# Patient Record
Sex: Male | Born: 1945 | Race: Black or African American | Hispanic: No | Marital: Single | Smoking: Former smoker
Health system: Southern US, Community
[De-identification: ages and names within clinical notes are randomized; demographics above are authoritative.]

## PROBLEM LIST (undated history)

## (undated) DIAGNOSIS — I739 Peripheral vascular disease, unspecified: Secondary | ICD-10-CM

## (undated) DIAGNOSIS — K219 Gastro-esophageal reflux disease without esophagitis: Secondary | ICD-10-CM

## (undated) DIAGNOSIS — F1010 Alcohol abuse, uncomplicated: Secondary | ICD-10-CM

## (undated) DIAGNOSIS — E7800 Pure hypercholesterolemia, unspecified: Secondary | ICD-10-CM

## (undated) DIAGNOSIS — G20A1 Parkinson's disease without dyskinesia, without mention of fluctuations: Secondary | ICD-10-CM

## (undated) DIAGNOSIS — N400 Enlarged prostate without lower urinary tract symptoms: Secondary | ICD-10-CM

## (undated) DIAGNOSIS — R251 Tremor, unspecified: Secondary | ICD-10-CM

## (undated) DIAGNOSIS — I10 Essential (primary) hypertension: Secondary | ICD-10-CM

## (undated) DIAGNOSIS — M47812 Spondylosis without myelopathy or radiculopathy, cervical region: Secondary | ICD-10-CM

## (undated) DIAGNOSIS — I2510 Atherosclerotic heart disease of native coronary artery without angina pectoris: Secondary | ICD-10-CM

## (undated) DIAGNOSIS — J449 Chronic obstructive pulmonary disease, unspecified: Secondary | ICD-10-CM

## (undated) DIAGNOSIS — G20 Parkinson's disease: Secondary | ICD-10-CM

## (undated) DIAGNOSIS — J439 Emphysema, unspecified: Secondary | ICD-10-CM

## (undated) DIAGNOSIS — M1990 Unspecified osteoarthritis, unspecified site: Secondary | ICD-10-CM

## (undated) HISTORY — DX: Alcohol abuse, uncomplicated: F10.10

## (undated) HISTORY — DX: Spondylosis without myelopathy or radiculopathy, cervical region: M47.812

## (undated) HISTORY — PX: VASECTOMY: SHX75

## (undated) HISTORY — DX: Parkinson's disease without dyskinesia, without mention of fluctuations: G20.A1

## (undated) HISTORY — DX: Atherosclerotic heart disease of native coronary artery without angina pectoris: I25.10

## (undated) HISTORY — DX: Tremor, unspecified: R25.1

## (undated) HISTORY — PX: TONSILLECTOMY: SUR1361

## (undated) HISTORY — DX: Parkinson's disease: G20

## (undated) HISTORY — DX: Gastro-esophageal reflux disease without esophagitis: K21.9

## (undated) HISTORY — DX: Chronic obstructive pulmonary disease, unspecified: J44.9

## (undated) HISTORY — DX: Peripheral vascular disease, unspecified: I73.9

## (undated) HISTORY — DX: Benign prostatic hyperplasia without lower urinary tract symptoms: N40.0

---

## 1998-07-26 ENCOUNTER — Emergency Department (HOSPITAL_COMMUNITY): Admission: EM | Admit: 1998-07-26 | Discharge: 1998-07-26 | Payer: Self-pay | Admitting: Emergency Medicine

## 1998-07-26 ENCOUNTER — Encounter: Payer: Self-pay | Admitting: Emergency Medicine

## 1998-08-08 ENCOUNTER — Ambulatory Visit (HOSPITAL_COMMUNITY): Admission: RE | Admit: 1998-08-08 | Discharge: 1998-08-08 | Payer: Self-pay | Admitting: Cardiology

## 1998-08-08 ENCOUNTER — Encounter: Payer: Self-pay | Admitting: Cardiology

## 1998-08-19 ENCOUNTER — Emergency Department (HOSPITAL_COMMUNITY): Admission: EM | Admit: 1998-08-19 | Discharge: 1998-08-19 | Payer: Self-pay | Admitting: Emergency Medicine

## 2000-11-23 ENCOUNTER — Emergency Department (HOSPITAL_COMMUNITY): Admission: EM | Admit: 2000-11-23 | Discharge: 2000-11-23 | Payer: Self-pay | Admitting: Emergency Medicine

## 2000-12-08 ENCOUNTER — Emergency Department (HOSPITAL_COMMUNITY): Admission: EM | Admit: 2000-12-08 | Discharge: 2000-12-08 | Payer: Self-pay | Admitting: Emergency Medicine

## 2001-04-16 ENCOUNTER — Encounter: Payer: Self-pay | Admitting: Dermatology

## 2001-04-16 ENCOUNTER — Encounter: Admission: RE | Admit: 2001-04-16 | Discharge: 2001-04-16 | Payer: Self-pay | Admitting: Gynecology

## 2003-10-26 ENCOUNTER — Ambulatory Visit (HOSPITAL_COMMUNITY): Admission: RE | Admit: 2003-10-26 | Discharge: 2003-10-26 | Payer: Self-pay | Admitting: Internal Medicine

## 2004-06-06 ENCOUNTER — Ambulatory Visit: Payer: Self-pay | Admitting: Internal Medicine

## 2004-06-06 ENCOUNTER — Ambulatory Visit (HOSPITAL_COMMUNITY): Admission: RE | Admit: 2004-06-06 | Discharge: 2004-06-06 | Payer: Self-pay | Admitting: Internal Medicine

## 2004-07-27 ENCOUNTER — Ambulatory Visit: Payer: Self-pay | Admitting: Internal Medicine

## 2004-08-07 ENCOUNTER — Ambulatory Visit: Payer: Self-pay | Admitting: Internal Medicine

## 2004-11-04 ENCOUNTER — Emergency Department (HOSPITAL_COMMUNITY): Admission: EM | Admit: 2004-11-04 | Discharge: 2004-11-04 | Payer: Self-pay | Admitting: Emergency Medicine

## 2005-05-08 ENCOUNTER — Ambulatory Visit: Payer: Self-pay | Admitting: Internal Medicine

## 2005-07-26 ENCOUNTER — Ambulatory Visit: Payer: Self-pay | Admitting: Internal Medicine

## 2005-10-06 ENCOUNTER — Observation Stay (HOSPITAL_COMMUNITY): Admission: EM | Admit: 2005-10-06 | Discharge: 2005-10-07 | Payer: Self-pay | Admitting: Emergency Medicine

## 2005-10-06 ENCOUNTER — Ambulatory Visit: Payer: Self-pay | Admitting: Cardiology

## 2005-10-07 ENCOUNTER — Ambulatory Visit: Payer: Self-pay | Admitting: Internal Medicine

## 2005-10-15 ENCOUNTER — Ambulatory Visit: Payer: Self-pay

## 2005-10-22 ENCOUNTER — Ambulatory Visit: Payer: Self-pay | Admitting: Internal Medicine

## 2005-10-26 ENCOUNTER — Ambulatory Visit: Payer: Self-pay | Admitting: Internal Medicine

## 2005-11-05 ENCOUNTER — Ambulatory Visit: Payer: Self-pay | Admitting: Internal Medicine

## 2005-12-07 ENCOUNTER — Ambulatory Visit: Payer: Self-pay | Admitting: Internal Medicine

## 2006-02-01 ENCOUNTER — Ambulatory Visit: Payer: Self-pay | Admitting: Internal Medicine

## 2006-05-01 ENCOUNTER — Ambulatory Visit: Payer: Self-pay | Admitting: Internal Medicine

## 2006-05-06 ENCOUNTER — Emergency Department (HOSPITAL_COMMUNITY): Admission: EM | Admit: 2006-05-06 | Discharge: 2006-05-06 | Payer: Self-pay | Admitting: Emergency Medicine

## 2006-08-08 ENCOUNTER — Ambulatory Visit: Payer: Self-pay | Admitting: Internal Medicine

## 2006-10-08 ENCOUNTER — Ambulatory Visit: Payer: Self-pay | Admitting: Internal Medicine

## 2007-02-03 ENCOUNTER — Ambulatory Visit: Payer: Self-pay | Admitting: Internal Medicine

## 2007-02-07 ENCOUNTER — Ambulatory Visit: Payer: Self-pay | Admitting: Internal Medicine

## 2007-02-14 ENCOUNTER — Encounter: Admission: RE | Admit: 2007-02-14 | Discharge: 2007-05-15 | Payer: Self-pay | Admitting: Occupational Medicine

## 2007-07-15 ENCOUNTER — Emergency Department (HOSPITAL_COMMUNITY): Admission: EM | Admit: 2007-07-15 | Discharge: 2007-07-15 | Payer: Self-pay | Admitting: Emergency Medicine

## 2007-07-17 ENCOUNTER — Ambulatory Visit (HOSPITAL_COMMUNITY): Admission: RE | Admit: 2007-07-17 | Discharge: 2007-07-17 | Payer: Self-pay | Admitting: Sports Medicine

## 2007-07-25 ENCOUNTER — Ambulatory Visit: Payer: Self-pay | Admitting: Internal Medicine

## 2007-07-25 DIAGNOSIS — I4891 Unspecified atrial fibrillation: Secondary | ICD-10-CM | POA: Insufficient documentation

## 2007-07-25 DIAGNOSIS — K219 Gastro-esophageal reflux disease without esophagitis: Secondary | ICD-10-CM | POA: Insufficient documentation

## 2007-07-25 DIAGNOSIS — I10 Essential (primary) hypertension: Secondary | ICD-10-CM | POA: Insufficient documentation

## 2007-07-25 DIAGNOSIS — N529 Male erectile dysfunction, unspecified: Secondary | ICD-10-CM | POA: Insufficient documentation

## 2007-07-25 DIAGNOSIS — M545 Low back pain, unspecified: Secondary | ICD-10-CM | POA: Insufficient documentation

## 2007-09-05 ENCOUNTER — Encounter: Payer: Self-pay | Admitting: Internal Medicine

## 2007-09-19 ENCOUNTER — Encounter
Admission: RE | Admit: 2007-09-19 | Discharge: 2007-09-19 | Payer: Self-pay | Admitting: Physical Medicine and Rehabilitation

## 2007-10-13 ENCOUNTER — Encounter: Payer: Self-pay | Admitting: Internal Medicine

## 2007-11-06 ENCOUNTER — Ambulatory Visit: Payer: Self-pay | Admitting: Internal Medicine

## 2007-11-07 ENCOUNTER — Emergency Department (HOSPITAL_COMMUNITY): Admission: EM | Admit: 2007-11-07 | Discharge: 2007-11-07 | Payer: Self-pay | Admitting: Emergency Medicine

## 2007-11-26 ENCOUNTER — Telehealth: Payer: Self-pay | Admitting: Internal Medicine

## 2007-11-27 ENCOUNTER — Ambulatory Visit: Payer: Self-pay | Admitting: Internal Medicine

## 2007-11-27 DIAGNOSIS — M25559 Pain in unspecified hip: Secondary | ICD-10-CM | POA: Insufficient documentation

## 2008-01-20 ENCOUNTER — Emergency Department (HOSPITAL_COMMUNITY): Admission: EM | Admit: 2008-01-20 | Discharge: 2008-01-20 | Payer: Self-pay | Admitting: Emergency Medicine

## 2008-07-30 DIAGNOSIS — D709 Neutropenia, unspecified: Secondary | ICD-10-CM | POA: Insufficient documentation

## 2008-12-16 ENCOUNTER — Emergency Department (HOSPITAL_COMMUNITY): Admission: EM | Admit: 2008-12-16 | Discharge: 2008-12-16 | Payer: Self-pay | Admitting: Emergency Medicine

## 2009-07-30 DIAGNOSIS — I2510 Atherosclerotic heart disease of native coronary artery without angina pectoris: Secondary | ICD-10-CM | POA: Insufficient documentation

## 2009-07-30 DIAGNOSIS — Z9889 Other specified postprocedural states: Secondary | ICD-10-CM | POA: Insufficient documentation

## 2010-12-15 NOTE — H&P (Signed)
NAME:  Lee, [name] NO.:  [id number]   MEDICAL RECORD NO.:  [id number]          PATIENT TYPE:  EMS   LOCATION:  MAJO                         FACILITY:  [facility]   PHYSICIAN:  Wanda Plump, MD [facility]    DATE OF BIRTH:  August 20, 1945   DATE OF ADMISSION:  10/06/2005  DATE OF DISCHARGE:                                HISTORY & PHYSICAL   PRIMARY CARE PHYSICIAN:  Dr. Posey Rea   CHIEF COMPLAINT:  Chest pain.   HISTORY OF PRESENT ILLNESS:  Lee [name] is a 65 year old African-American  male who presented to the emergency room with a 3-day history of mild chest  discomfort. The discomfort is located at the left anterior chest. The  patient denies any radiation of the pain to me. It lasts 1 or 2 seconds, it  may be with or without exertion. At least a couple times it has been  postprandial. The patient got concerned because also a couple of times this  discomfort has been associated with weakness, headache and dizziness. No  nausea or diaphoresis with it, however he did admit to having some shortness  of breath. Presently he is chest pain free. In the last 3 days he has had  around 5 or 6 episodes of these symptoms.   PAST MEDICAL HISTORY:  1.  Denies any surgeries.  2.  Denies diabetes.  3.  During one of his complete physicals in the past he was told he had a      slightly elevated blood pressure and slightly elevated cholesterol.   FAMILY HISTORY:  1.  No coronary artery disease or stroke.  2.  Father had a pacemaker.  3.  Mother had diabetes.   SOCIAL HISTORY:  No smoking. Drinks socially.   REVIEW OF SYSTEMS:  He denies any fever. He denies any heart burn but points  to the left upper quadrant area of the abdomen when he has burning from time  to time. No lower extremity edema. No rash in the chest.   MEDICATIONS:  Aspirin 325 one daily.   ALLERGIES:  No known drug allergies.   PHYSICAL EXAMINATION:  GENERAL:  The patient is alert and oriented and in no  distress.  VITAL SIGNS:  He is afebrile, pulse 74, respiration 20, blood pressure  146/90, O2 saturation 98% on room air.  LUNGS:  Clear.  CARDIOVASCULAR:  Regular rate and rhythm without a murmur.  CHEST WALL:  Not tender to palpation.  ABDOMEN:  Not distended, soft, good bowel sounds, nontender.  EXTREMITIES:  No edema.  NEUROLOGICAL EXAM:  Speech and motor are intact.   LABORATORY AND X-RAY:  Chest x-ray is negative. EKG shows no acute changes.  D-dimer is negative. Cardiac enzymes x2 in the emergency room were negative.  Potassium 3.7, creatinine 1.1, blood sugar 92.   ASSESSMENT AND PLAN:  The patient is admitted with new onset of atypical  chest pain. He is cardiovascular risk factor profile is low because he is  not a smoker with no known diabetes, his family history is negative. His  cholesterol status is unknown. He however does have  a slightly elevated  blood pressure. At this point he will be admitted to telemetry, will rule  him out for an acute myocardial infarction with serial enzymes and an EKG in  the morning. If the work up is negative he will be able to go home tomorrow  and have a stress-test done as an outpatient on Monday, October 08, 2005. This  was discussed by cardiology. Will consider also a low dose of beta blocker  depending on how his blood pressure does during this admission.      Wanda Plump, MD [facility]  Electronically Signed     JEP/MEDQ  D:  10/06/2005  T:  10/07/2005  Job:  [id number]   cc:   Georgina Quint. [name], M.D. [facility]  [address]  [place]  [place] [address]

## 2010-12-15 NOTE — Op Note (Signed)
NAME:  Lee [name], Lee [name] NO.:  [id number]   MEDICAL RECORD NO.:  [id number]          PATIENT TYPE:  OBV   LOCATION:  3729                         FACILITY:  [facility]   PHYSICIAN:  Georgina Quint. [name], M.D. LHCDATE OF BIRTH:  [birth year]   DATE OF PROCEDURE:  DATE OF DISCHARGE:                                 OPERATIVE REPORT   FINAL DIAGNOSES:  1.  Atypical chest pain most likely due to costochondritis.  2.  Hypertension.   DISCHARGE DISPOSITION:  He will stay out of work until stress test.  That  will be scheduled some time early next week.  He will call if there are any  problems.   DISCHARGE MEDICATIONS:  Resume previous meds, Pepcid AC over-the-counter 1  daily.   SPECIAL INSTRUCTIONS:  Call if problems.   HISTORY:  The patient is a 65 year old male who presented with atypical  chest symptoms yesterday and was admitted by Dr. Drue Novel.  For the details,  please refer to his history and physical.   HOSPITAL COURSE:  During the hospital course, the patient had no chest pain.  On the day of discharge he is feeling well.  Temperature 98.1, heart rate  75, respiratory rate 20, blood pressure 131/92.  Oxygen saturations 92% room  air.  He is in no acute distress.  Lungs clear. Heart regular.  S1, S2,  abdomen soft and nontender.  Chest wall slight tenderness over costochondral  junction midsternum on the left.  Alert and oriented, cooperative.   LABS:  CK x3 negative. MB x3 negative.  Troponins x3 negative.  EKG today  was no acute changes, ectopic atrial rhythm.  Chest x-ray normal.           ______________________________  Georgina Quint. [name], M.D. [facility]     AVP/MEDQ  D:  10/07/2005  T:  10/08/2005  Job:  [id number]

## 2011-05-22 ENCOUNTER — Emergency Department (HOSPITAL_COMMUNITY)
Admission: EM | Admit: 2011-05-22 | Discharge: 2011-05-23 | Disposition: A | Discharge status: Home / Self Care | Payer: Medicare Other | Attending: Emergency Medicine | Admitting: Emergency Medicine

## 2011-05-22 DIAGNOSIS — J449 Chronic obstructive pulmonary disease, unspecified: Secondary | ICD-10-CM | POA: Insufficient documentation

## 2011-05-22 DIAGNOSIS — R079 Chest pain, unspecified: Secondary | ICD-10-CM | POA: Insufficient documentation

## 2011-05-22 DIAGNOSIS — J4489 Other specified chronic obstructive pulmonary disease: Secondary | ICD-10-CM | POA: Insufficient documentation

## 2011-05-22 DIAGNOSIS — I10 Essential (primary) hypertension: Secondary | ICD-10-CM | POA: Insufficient documentation

## 2011-05-22 DIAGNOSIS — M069 Rheumatoid arthritis, unspecified: Secondary | ICD-10-CM | POA: Insufficient documentation

## 2011-05-22 DIAGNOSIS — K449 Diaphragmatic hernia without obstruction or gangrene: Secondary | ICD-10-CM | POA: Insufficient documentation

## 2011-05-23 ENCOUNTER — Emergency Department (HOSPITAL_COMMUNITY): Payer: Medicare Other

## 2011-05-23 LAB — CBC
Hemoglobin: 13.8 g/dL (ref 13.0–17.0)
MCH: 32.4 pg (ref 26.0–34.0)
MCHC: 34.2 g/dL (ref 30.0–36.0)
MCV: 94.6 fL (ref 78.0–100.0)
RBC: 4.26 MIL/uL (ref 4.22–5.81)
RDW: 12.3 % (ref 11.5–15.5)
WBC: 3.9 10*3/uL — ABNORMAL LOW (ref 4.0–10.5)

## 2011-05-23 LAB — BASIC METABOLIC PANEL
BUN: 19 mg/dL (ref 6–23)
CO2: 29 mEq/L (ref 19–32)
Calcium: 9.3 mg/dL (ref 8.4–10.5)
Glucose, Bld: 86 mg/dL (ref 70–99)

## 2011-05-23 LAB — DIFFERENTIAL
Basophils Absolute: 0 10*3/uL (ref 0.0–0.1)
Eosinophils Absolute: 0.5 10*3/uL (ref 0.0–0.7)
Eosinophils Relative: 13 % — ABNORMAL HIGH (ref 0–5)
Lymphocytes Relative: 41 % (ref 12–46)
Neutrophils Relative %: 35 % — ABNORMAL LOW (ref 43–77)

## 2011-05-23 LAB — POCT I-STAT TROPONIN I: Troponin i, poc: 0.01 ng/mL (ref 0.00–0.08)

## 2012-11-30 ENCOUNTER — Emergency Department (HOSPITAL_COMMUNITY): Payer: Medicare Other

## 2012-11-30 ENCOUNTER — Encounter (HOSPITAL_COMMUNITY): Payer: Self-pay | Admitting: Emergency Medicine

## 2012-11-30 ENCOUNTER — Emergency Department (HOSPITAL_COMMUNITY)
Admission: EM | Admit: 2012-11-30 | Discharge: 2012-11-30 | Disposition: A | Discharge status: Home / Self Care | Payer: Medicare Other | Attending: Emergency Medicine | Admitting: Emergency Medicine

## 2012-11-30 DIAGNOSIS — R059 Cough, unspecified: Secondary | ICD-10-CM | POA: Insufficient documentation

## 2012-11-30 DIAGNOSIS — R0609 Other forms of dyspnea: Secondary | ICD-10-CM | POA: Insufficient documentation

## 2012-11-30 DIAGNOSIS — R062 Wheezing: Secondary | ICD-10-CM | POA: Insufficient documentation

## 2012-11-30 DIAGNOSIS — Z8739 Personal history of other diseases of the musculoskeletal system and connective tissue: Secondary | ICD-10-CM | POA: Insufficient documentation

## 2012-11-30 DIAGNOSIS — J4489 Other specified chronic obstructive pulmonary disease: Secondary | ICD-10-CM | POA: Insufficient documentation

## 2012-11-30 DIAGNOSIS — J449 Chronic obstructive pulmonary disease, unspecified: Secondary | ICD-10-CM | POA: Insufficient documentation

## 2012-11-30 DIAGNOSIS — Z79899 Other long term (current) drug therapy: Secondary | ICD-10-CM | POA: Insufficient documentation

## 2012-11-30 DIAGNOSIS — R0989 Other specified symptoms and signs involving the circulatory and respiratory systems: Secondary | ICD-10-CM | POA: Insufficient documentation

## 2012-11-30 DIAGNOSIS — Z87891 Personal history of nicotine dependence: Secondary | ICD-10-CM | POA: Insufficient documentation

## 2012-11-30 DIAGNOSIS — R0600 Dyspnea, unspecified: Secondary | ICD-10-CM

## 2012-11-30 DIAGNOSIS — R05 Cough: Secondary | ICD-10-CM | POA: Insufficient documentation

## 2012-11-30 HISTORY — DX: Unspecified osteoarthritis, unspecified site: M19.90

## 2012-11-30 MED ORDER — ALBUTEROL SULFATE HFA 108 (90 BASE) MCG/ACT IN AERS: 2.0000 | INHALATION_SPRAY | RESPIRATORY_TRACT | Status: DC | PRN

## 2012-11-30 MED ORDER — HYDROCOD POLST-CHLORPHEN POLST 10-8 MG/5ML PO LQCR
5.0000 mL | Freq: Once | ORAL | Status: AC
  Administered 2012-11-30: 5 mL via ORAL
  Filled 2012-11-30: qty 5

## 2012-11-30 MED ORDER — LORATADINE 10 MG PO TABS: 10.0000 mg | ORAL_TABLET | Freq: Every day | ORAL | Status: DC

## 2012-11-30 MED ORDER — HYDROCOD POLST-CHLORPHEN POLST 10-8 MG/5ML PO LQCR: 5.0000 mL | Freq: Two times a day (BID) | ORAL | Status: DC | PRN

## 2012-11-30 NOTE — ED Notes (Signed)
Pt states he has been having SOB x 1 wk and is having sternal chest pain with coughing.

## 2012-11-30 NOTE — ED Notes (Signed)
Patient transported to X-ray 

## 2012-11-30 NOTE — ED Provider Notes (Signed)
History     CSN: [id number]  Arrival date & time 11/30/12  0409   First MD Initiated Contact with Patient 11/30/12 [phone]      Chief Complaint  Patient presents with  . Shortness of Breath    (Consider location/radiation/quality/duration/timing/severity/associated sxs/prior treatment) HPI 67 yo male presents to the ER with complaint of sob, cough, and wheezing.  He reports sxs have been ongoing for the last 2-3 weeks.  Worse when cutting the grass, and much worse last night when cleaning the bathroom with new cleaner.  No fevers.  Reports past history of COPD, quit smoking 15 years ago.  Sputum has ranged from yellow to clear.  Sxs improved with taking otc cold medications.  Past Medical History  Diagnosis Date  . Arthritis     History reviewed. No pertinent past surgical history.  No family history on file.  History  Substance Use Topics  . Smoking status: Never Smoker   . Smokeless tobacco: Never Used  . Alcohol Use: No      Review of Systems  All other systems reviewed and are negative.    Allergies  Enalapril maleate  Home Medications   Current Outpatient Rx  Name  Route  Sig  Dispense  Refill  . guaiFENesin (MUCINEX) 600 MG 12 hr tablet   Oral   Take 1,200 mg by mouth 2 (two) times daily as needed for congestion.         Marland Kitchen HYDROcodone-acetaminophen (VICODIN ES) 7.5-750 MG per tablet   Oral   Take 0.5-1 tablets by mouth every 8 (eight) hours as needed for pain.         Marland Kitchen PRESCRIPTION MEDICATION   Inhalation   Inhale 1 puff into the lungs 2 (two) times daily. Inhaler-pt unsure of name           BP 117/69  Pulse 84  Temp(Src) 98.3 F (36.8 C) (Oral)  Resp 18  Ht 5\' 11"  (1.803 m)  Wt 187 lb (84.823 kg)  BMI 26.09 kg/m2  SpO2 96%  Physical Exam  Nursing note and vitals reviewed. Constitutional: He is oriented to person, place, and time. He appears well-developed and well-nourished.  HENT:  Head: Normocephalic and atraumatic.  Nose: Nose  normal.  Mouth/Throat: Oropharynx is clear and moist.  Eyes: Conjunctivae and EOM are normal. Pupils are equal, round, and reactive to light.  Neck: Normal range of motion. Neck supple. No JVD present. No tracheal deviation present. No thyromegaly present.  Cardiovascular: Normal rate, regular rhythm, normal heart sounds and intact distal pulses.  Exam reveals no gallop and no friction rub.   No murmur heard. Pulmonary/Chest: Effort normal. No stridor. No respiratory distress. He has wheezes (very mild end expiratory wheeze). He has no rales. He exhibits no tenderness.  Cough noted  Abdominal: Soft. Bowel sounds are normal. He exhibits no distension and no mass. There is no tenderness. There is no rebound and no guarding.  Musculoskeletal: Normal range of motion. He exhibits no edema and no tenderness.  Lymphadenopathy:    He has no cervical adenopathy.  Neurological: He is alert and oriented to person, place, and time. He exhibits normal muscle tone. Coordination normal.  Skin: Skin is warm and dry. No rash noted. No erythema. No pallor.  Psychiatric: He has a normal mood and affect. His behavior is normal. Judgment and thought content normal.    ED Course  Procedures (including critical care time)  Labs Reviewed - No data to display Dg Chest 2  View  11/30/2012  *RADIOLOGY REPORT*  Clinical Data: Productive cough  CHEST - 2 VIEW  Comparison: 05/23/2011  Findings: Mild lingular opacity.  Lungs otherwise mildly hyperexpanded and clear.  Cardiomediastinal contours within normal range. No pleural effusion or pneumothorax.  No acute osseous finding.  IMPRESSION: Mild lingular opacity; atelectasis or scarring (favored) versus mild infiltrate.   Original Report Authenticated By: Jearld Lesch, M.D.      1. Dyspnea       MDM  67 yo male with intermittent cough over 2-3 weeks.  No signs of acute bronchospasm, infection, inflammation on cxr or exam.  Will prescribe allergy med for use prior  to cutting grass.  Pt to continue mucinex as needed, will also give albuterol.       Olivia Mackie, MD 11/30/12 [phone]

## 2012-11-30 NOTE — ED Notes (Signed)
Patient with hx of emphysema, semi-productive cough with mostly clear sputum that started about 2 week ago. Patient states that he also has yellowish mucous from his nose when he blows it. Patient c/o a feeling of congestion sternally, denies pain at this time, does get slight headache if coughing to hard.  Patient encouraged to come to be seen this morning by a friend who was staying with him last night.

## 2013-06-17 ENCOUNTER — Other Ambulatory Visit (HOSPITAL_COMMUNITY): Payer: Self-pay | Admitting: Radiology

## 2013-06-17 ENCOUNTER — Ambulatory Visit (HOSPITAL_COMMUNITY): Payer: Non-veteran care | Attending: Internal Medicine | Admitting: Radiology

## 2013-06-17 ENCOUNTER — Encounter: Payer: Self-pay | Admitting: Cardiology

## 2013-06-17 ENCOUNTER — Encounter: Payer: Self-pay | Admitting: Internal Medicine

## 2013-06-17 VITALS — BP 137/80 | Ht 71.5 in | Wt 190.0 lb

## 2013-06-17 DIAGNOSIS — R0609 Other forms of dyspnea: Secondary | ICD-10-CM | POA: Insufficient documentation

## 2013-06-17 DIAGNOSIS — R0602 Shortness of breath: Secondary | ICD-10-CM

## 2013-06-17 DIAGNOSIS — R9431 Abnormal electrocardiogram [ECG] [EKG]: Secondary | ICD-10-CM | POA: Diagnosis present

## 2013-06-17 DIAGNOSIS — R079 Chest pain, unspecified: Secondary | ICD-10-CM | POA: Insufficient documentation

## 2013-06-17 DIAGNOSIS — R0989 Other specified symptoms and signs involving the circulatory and respiratory systems: Secondary | ICD-10-CM | POA: Insufficient documentation

## 2013-06-17 DIAGNOSIS — E785 Hyperlipidemia, unspecified: Secondary | ICD-10-CM | POA: Diagnosis not present

## 2013-06-17 DIAGNOSIS — I10 Essential (primary) hypertension: Secondary | ICD-10-CM | POA: Diagnosis not present

## 2013-06-17 DIAGNOSIS — Z87891 Personal history of nicotine dependence: Secondary | ICD-10-CM | POA: Insufficient documentation

## 2013-06-17 MED ORDER — TECHNETIUM TC 99M SESTAMIBI GENERIC - CARDIOLITE
10.0000 | Freq: Once | INTRAVENOUS | Status: AC | PRN
  Administered 2013-06-17: 10 via INTRAVENOUS

## 2013-06-17 MED ORDER — TECHNETIUM TC 99M SESTAMIBI GENERIC - CARDIOLITE
30.0000 | Freq: Once | INTRAVENOUS | Status: AC | PRN
  Administered 2013-06-17: 30 via INTRAVENOUS

## 2013-06-17 NOTE — Progress Notes (Signed)
[facility] SITE 3 NUCLEAR MED [address] [place], [place] [address] [phone]    Cardiology Nuclear Med Study  Lee [name] is a 67 y.o. male     MRN : [id number]     DOB: Jul 29, 1946  Procedure Date: 06/17/2013  Nuclear Med Background Indication for Stress Test:  Evaluation for Ischemia and Abnormal EKG History:  No history of CAD Cardiac Risk Factors: History of Smoking, Hypertension and Lipids  Symptoms:  Chest Pain and DOE   Nuclear Pre-Procedure Caffeine/Decaff Intake:  None > 12 hrs NPO After: 9:00pm   Lungs:  clear O2 Sat: 95% on room air. IV 0.9% NS with Angio Cath:  20g  IV Site: R Antecubital x 1, tolerated well IV Started by:  Irean Hong, RN  Chest Size (in):  40 Cup Size: n/a  Height: 5' 11.5" (1.816 m)  Weight:  190 lb (86.183 kg)  BMI:  Body mass index is 26.13 kg/(m^2). Tech Comments:  Took morning medications    Nuclear Med Study 1 or 2 day study: 1 day  Stress Test Type:  Stress  Reading MD: Kristeen Miss, MD  Order Authorizing Provider:  Bennie Pierini, MD  Resting Radionuclide: Technetium 37m Sestamibi  Resting Radionuclide Dose: 11.0 mCi   Stress Radionuclide:  Technetium 38m Sestamibi  Stress Radionuclide Dose: 33.0 mCi           Stress Protocol Rest HR: 64 Stress HR: 155  Rest BP: 137/80 Stress BP: 213/82  Exercise Time (min): 7:48 METS: 9.7   Predicted Max HR: 155 bpm % Max HR: 100 bpm Rate Pressure Product: 21308   Dose of Adenosine (mg):  n/a Dose of Lexiscan: n/a mg  Dose of Atropine (mg): n/a Dose of Dobutamine: n/a mcg/kg/min (at max HR)  Stress Test Technologist: Bonnita Levan, RN  Nuclear Technologist:  Dario Guardian, CNMT     Rest Procedure:  Myocardial perfusion imaging was performed at rest 45 minutes following the intravenous administration of Technetium 79m Sestamibi. Rest ECG: NSR - Normal EKG  Stress Procedure:  The patient exercised on the treadmill utilizing the Bruce Protocol for 7:48 minutes. The  patient stopped due to fatigue,dyspnea and denied any chest pain.  Technetium 50m Sestamibi was injected at peak exercise and myocardial perfusion imaging was performed after a brief delay. Stress ECG: No significant change from baseline ECG  QPS Raw Data Images:  Normal; no motion artifact; normal heart/lung ratio. Stress Images:  Normal homogeneous uptake in all areas of the myocardium. Rest Images:  Normal homogeneous uptake in all areas of the myocardium. Subtraction (SDS):  No evidence of ischemia. Transient Ischemic Dilatation (Normal <1.22):  0.95 Lung/Heart Ratio (Normal <0.45):  0.25  Quantitative Gated Spect Images QGS EDV:  89 ml QGS ESV:  31 ml  Impression Exercise Capacity:  Good exercise capacity. BP Response:  Normal blood pressure response. Clinical Symptoms:  No significant symptoms noted. ECG Impression:  No significant ST segment change suggestive of ischemia. Comparison with Prior Nuclear Study: No images to compare  Overall Impression:  Normal stress nuclear study.  LV Ejection Fraction: 65%.  LV Wall Motion:  NL LV Function; NL Wall Motion.   [name], [name], MD, [facility] 06/17/2013, 5:27 PM Office - [phone] Pager [phone]

## 2014-06-09 ENCOUNTER — Ambulatory Visit (INDEPENDENT_AMBULATORY_CARE_PROVIDER_SITE_OTHER): Payer: Medicare Other | Admitting: Internal Medicine

## 2014-06-09 ENCOUNTER — Encounter: Payer: Self-pay | Admitting: Internal Medicine

## 2014-06-09 ENCOUNTER — Telehealth: Payer: Self-pay | Admitting: *Deleted

## 2014-06-09 VITALS — BP 130/72 | HR 80 | Temp 98.3°F | Resp 16 | Ht 71.5 in | Wt 172.1 lb

## 2014-06-09 DIAGNOSIS — N528 Other male erectile dysfunction: Secondary | ICD-10-CM

## 2014-06-09 DIAGNOSIS — I10 Essential (primary) hypertension: Secondary | ICD-10-CM

## 2014-06-09 DIAGNOSIS — F1021 Alcohol dependence, in remission: Secondary | ICD-10-CM

## 2014-06-09 DIAGNOSIS — M5440 Lumbago with sciatica, unspecified side: Secondary | ICD-10-CM

## 2014-06-09 DIAGNOSIS — N529 Male erectile dysfunction, unspecified: Secondary | ICD-10-CM

## 2014-06-09 DIAGNOSIS — K219 Gastro-esophageal reflux disease without esophagitis: Secondary | ICD-10-CM

## 2014-06-09 DIAGNOSIS — I480 Paroxysmal atrial fibrillation: Secondary | ICD-10-CM

## 2014-06-09 MED ORDER — VITAMIN D 1000 UNITS PO TABS: 1000.0000 [IU] | ORAL_TABLET | Freq: Every day | ORAL | Status: DC

## 2014-06-09 MED ORDER — ASPIRIN 325 MG PO TABS: 325.0000 mg | ORAL_TABLET | Freq: Every day | ORAL | Status: DC

## 2014-06-09 MED ORDER — ASPIRIN EC 81 MG PO TBEC: 81.0000 mg | DELAYED_RELEASE_TABLET | Freq: Every day | ORAL | Status: DC

## 2014-06-09 NOTE — Progress Notes (Signed)
   Subjective:    Patient ID: Lee [name], male    DOB: 07-May-1946, 68 y.o.   MRN: [id number]  HPI  New pt - to re-est w/me  The patient presents for a follow-up of  Chronic COPD, hypertension, A fib, LBP/OA  Not taking ASA. Physical - [facility]  BP Readings from Last 3 Encounters:  06/09/14 130/72  06/17/13 137/80  11/30/12 130/79   Wt Readings from Last 3 Encounters:  06/09/14 172 lb 1.3 oz (78.055 kg)  06/17/13 190 lb (86.183 kg)  11/30/12 187 lb (84.823 kg)      Review of Systems  Constitutional: Negative for appetite change, fatigue and unexpected weight change.  HENT: Negative for congestion, nosebleeds, sneezing, sore throat and trouble swallowing.   Eyes: Negative for itching and visual disturbance.  Respiratory: Positive for cough and chest tightness.   Cardiovascular: Negative for chest pain, palpitations and leg swelling.  Gastrointestinal: Negative for nausea, diarrhea, blood in stool and abdominal distention.  Genitourinary: Negative for frequency and hematuria.  Musculoskeletal: Negative for back pain, joint swelling, gait problem and neck pain.  Skin: Negative for rash.  Neurological: Negative for dizziness, tremors, speech difficulty and weakness.  Psychiatric/Behavioral: Negative for sleep disturbance, dysphoric mood and agitation. The patient is not nervous/anxious.        Objective:   Physical Exam  Constitutional: He is oriented to person, place, and time. He appears well-developed. No distress.  NAD  HENT:  Mouth/Throat: Oropharynx is clear and moist.  Eyes: Conjunctivae are normal. Pupils are equal, round, and reactive to light.  Neck: Normal range of motion. No JVD present. No thyromegaly present.  Cardiovascular: Normal rate, regular rhythm, normal heart sounds and intact distal pulses.  Exam reveals no gallop and no friction rub.   No murmur heard. Pulmonary/Chest: Effort normal and breath sounds normal. No respiratory distress. He has no  wheezes. He has no rales. He exhibits no tenderness.  Abdominal: Soft. Bowel sounds are normal. He exhibits no distension and no mass. There is no tenderness. There is no rebound and no guarding.  Musculoskeletal: Normal range of motion. He exhibits no edema or tenderness.  Lymphadenopathy:    He has no cervical adenopathy.  Neurological: He is alert and oriented to person, place, and time. He has normal reflexes. No cranial nerve deficit. He exhibits normal muscle tone. He displays a negative Romberg sign. Coordination and gait normal.  No meningeal signs  Skin: Skin is warm and dry. No rash noted.  Psychiatric: He has a normal mood and affect. His behavior is normal. Judgment and thought content normal.    Lab Results  Component Value Date   WBC 3.9* 05/23/2011   HGB 13.8 05/23/2011   HCT 40.3 05/23/2011   PLT 187 05/23/2011   GLUCOSE 86 05/23/2011   NA 137 05/23/2011   K 3.7 05/23/2011   CL 100 05/23/2011   CREATININE 0.96 05/23/2011   BUN 19 05/23/2011   CO2 29 05/23/2011    Labs - [facility] stress test - OK     Assessment & Plan:

## 2014-06-09 NOTE — Telephone Encounter (Signed)
Pt states he fail to ask md what was the stress test results back in Nov he never received results...Lee [name]

## 2014-06-09 NOTE — Assessment & Plan Note (Signed)
Better w/Wt loss

## 2014-06-09 NOTE — Assessment & Plan Note (Signed)
Chronic - resolved w/wt loss, better diet BP Readings from Last 3 Encounters:  06/09/14 130/72  06/17/13 137/80  11/30/12 130/79

## 2014-06-09 NOTE — Assessment & Plan Note (Signed)
2015 - x10 years dry

## 2014-06-09 NOTE — Assessment & Plan Note (Addendum)
OA/MSK  Start Vit D Tylenol prn

## 2014-06-09 NOTE — Assessment & Plan Note (Signed)
Chronic. 

## 2014-06-09 NOTE — Assessment & Plan Note (Signed)
Restart ASA.

## 2014-06-09 NOTE — Progress Notes (Signed)
Pre visit review using our clinic review tool, if applicable. No additional management support is needed unless otherwise documented below in the visit note. 

## 2014-06-09 NOTE — Telephone Encounter (Signed)
CL is ok Thx

## 2014-06-10 NOTE — Telephone Encounter (Signed)
Notified pt with md response.../lmb 

## 2014-11-10 DIAGNOSIS — J45901 Unspecified asthma with (acute) exacerbation: Secondary | ICD-10-CM | POA: Diagnosis not present

## 2014-11-10 DIAGNOSIS — F419 Anxiety disorder, unspecified: Secondary | ICD-10-CM | POA: Diagnosis not present

## 2014-11-10 DIAGNOSIS — F4312 Post-traumatic stress disorder, chronic: Secondary | ICD-10-CM | POA: Diagnosis not present

## 2014-11-10 DIAGNOSIS — M545 Low back pain: Secondary | ICD-10-CM | POA: Diagnosis not present

## 2014-12-08 ENCOUNTER — Ambulatory Visit: Payer: Medicare Other | Admitting: Internal Medicine

## 2015-03-30 DIAGNOSIS — H25093 Other age-related incipient cataract, bilateral: Secondary | ICD-10-CM | POA: Diagnosis not present

## 2015-03-30 DIAGNOSIS — H18419 Arcus senilis, unspecified eye: Secondary | ICD-10-CM | POA: Diagnosis not present

## 2015-06-02 ENCOUNTER — Encounter: Payer: Self-pay | Admitting: Neurology

## 2015-06-02 ENCOUNTER — Ambulatory Visit (INDEPENDENT_AMBULATORY_CARE_PROVIDER_SITE_OTHER): Payer: Medicare Other | Admitting: Neurology

## 2015-06-02 VITALS — BP 126/84 | HR 72 | Ht 71.0 in | Wt 180.0 lb

## 2015-06-02 DIAGNOSIS — R251 Tremor, unspecified: Secondary | ICD-10-CM

## 2015-06-02 DIAGNOSIS — M47812 Spondylosis without myelopathy or radiculopathy, cervical region: Secondary | ICD-10-CM

## 2015-06-02 HISTORY — DX: Spondylosis without myelopathy or radiculopathy, cervical region: M47.812

## 2015-06-02 HISTORY — DX: Tremor, unspecified: R25.1

## 2015-06-02 NOTE — Patient Instructions (Signed)
   We will check MRI of the brain.  Tremor A tremor is trembling or shaking that you cannot control. Most tremors affect the hands or arms. Tremors can also affect the head, vocal cords, face, and other parts of the body. There are many types of tremors. Common types include:   Essential tremor. These usually occur in people over the age of 31. It may run in families and can happen in otherwise healthy people.   Resting tremor. These occur when the muscles are at rest, such as when your hands are resting in your lap. People with Parkinson disease often have resting tremors.   Postural tremor. These occur when you try to hold a pose, such as keeping your hands outstretched.   Kinetic tremor. These occur during purposeful movement, such as trying to touch a finger to your nose.   Task-specific tremor. These may occur when you perform tasks such as handwriting, speaking, or standing.   Psychogenic tremor. These dramatically lessen or disappear when you are distracted. They can happen in people of all ages.  Some types of tremors have no known cause. Tremors can also be a symptom of nervous system problems (neurological disorders) that may occur with aging. Some tremors go away with treatment while others do not.  HOME CARE INSTRUCTIONS Watch your tremor for any changes. The following actions may help to lessen any discomfort you are feeling:  Take medicines only as directed by your health care provider.   Limit alcohol intake to no more than 1 drink per day for nonpregnant women and 2 drinks per day for men. One drink equals 12 oz of beer, 5 oz of wine, or 1 oz of hard liquor.  Do not use any tobacco products, including cigarettes, chewing tobacco, or electronic cigarettes. If you need help quitting, ask your health care provider.   Avoid extreme heat or cold.   Limit the amount of caffeine you consumeas directed by your health care provider.   Try to get 8 hours of sleep  each night.  Find ways to manage your stress, such as meditation or yoga.  Keep all follow-up visits as directed by your health care provider. This is important. SEEK MEDICAL CARE IF:  You start having a tremor after starting a new medicine.  You have tremor with other symptoms such as:  Numbness.  Tingling.  Pain.  Weakness.  Your tremor gets worse.  Your tremor interferes with your day-to-day life.   This information is not intended to replace advice given to you by your health care provider. Make sure you discuss any questions you have with your health care provider.   Document Released: 07/06/2002 Document Revised: 08/06/2014 Document Reviewed: 01/11/2014 Elsevier Interactive Patient Education [employer].

## 2015-06-02 NOTE — Progress Notes (Signed)
Reason for visit: Tremor  Referring physician: Dr. Magnus Sinning is a 69 y.o. male  History of present illness:  Lee [name] is a 69 year old right-handed black male with a history of a tremor that developed about a year ago. As time has gone on, the tremor has gradually worsened. He mainly notes the tremor when he is resting the arm, but he will also occasionally have tremor when he is holding something in his hand. He indicates that the tremor has not kept him from doing anything, his handwriting is still about the same, he denies any problems with feeding himself. The patient denies tremor with the left arm, with the legs, or with the head or neck or jaw. The patient does report some cervical spondylosis, neck pain, aching into the right shoulder and occasionally across to the left. He denies pain down the arm. He will occasionally will have a cramping sensation in the right hand. He denies any numbness or weakness of the extremities, he denies issues controlling the bowels or the bladder. He has not had any change in balance, he denies that activities of daily living have slowed down. He has not noted any change in the way he is walking. He indicates that his father also had a similar tremor. The patient is sent to this office for further evaluation.  Past Medical History  Diagnosis Date  . Arthritis   . Alcohol abuse     quit 10 y ago  . Acid reflux   . Tremor 06/02/2015  . Cervical spondylosis without myelopathy 06/02/2015    Past Surgical History  Procedure Laterality Date  . Tonsillectomy    . Vasectomy      Family History  Problem Relation Age of Onset  . Cancer Brother 61    prostate ca  . Irregular heart beat Mother   . Irregular heart beat Father   . Tremor Father   . Cancer Sister     Social history:  reports that he quit smoking about 9 years ago. He has never used smokeless tobacco. He reports that he does not drink alcohol or use illicit  drugs.  Medications:  Prior to Admission medications   Medication Sig Start Date End Date Taking? Authorizing Provider  aspirin EC 81 MG tablet Take 1 tablet (81 mg total) by mouth daily. 06/09/14  Yes Aleksei [name] V, MD  Budesonide-Formoterol Fumarate (SYMBICORT IN) Inhale 2 puffs into the lungs 2 (two) times daily.   Yes Historical Provider, MD  Tiotropium Bromide Monohydrate (SPIRIVA RESPIMAT IN) Inhale 2 puffs into the lungs daily.   Yes Historical Provider, MD      Allergies  Allergen Reactions  . Enalapril Maleate Other (See Comments)    REACTION: ED    ROS:  Out of a complete 14 system review of symptoms, the patient complains only of the following symptoms, and all other reviewed systems are negative.  Shortness of breath Skin rash Urination problems Muscle cramps Weakness, dizziness  Blood pressure 126/84, pulse 72, height 5\' 11"  (1.803 m), weight 180 lb (81.647 kg).  Physical Exam  General: The patient is alert and cooperative at the time of the examination.  Eyes: Pupils are equal, round, and reactive to light. Discs are flat bilaterally.  Neck: The neck is supple, no carotid bruits are noted.  Respiratory: The respiratory examination is clear.  Cardiovascular: The cardiovascular examination reveals a regular rate and rhythm, no obvious murmurs or rubs are noted.  Skin: Extremities are  without significant edema.  Neurologic Exam  Mental status: The patient is alert and oriented x 3 at the time of the examination. The patient has apparent normal recent and remote memory, with an apparently normal attention span and concentration ability.  Cranial nerves: Facial symmetry is present. There is good sensation of the face to pinprick and soft touch bilaterally. The strength of the facial muscles and the muscles to head turning and shoulder shrug are normal bilaterally. Speech is well enunciated, no aphasia or dysarthria is noted. Extraocular movements are  full. Visual fields are full. The tongue is midline, and the patient has symmetric elevation of the soft palate. No obvious hearing deficits are noted.  Motor: The motor testing reveals 5 over 5 strength of all 4 extremities. Good symmetric motor tone is noted throughout.  Sensory: Sensory testing is intact to pinprick, soft touch, vibration sensation, and position sense on all 4 extremities. No evidence of extinction is noted.  Coordination: Cerebellar testing reveals good finger-nose-finger and heel-to-shin bilaterally. A resting tremor is noted with the right upper extremity. No evidence of an intention tremor is seen on either side.  Gait and station: Gait is normal. Tandem gait is normal. Romberg is negative. No drift is seen.  Reflexes: Deep tendon reflexes are symmetric and normal bilaterally. Toes are downgoing bilaterally.   Assessment/Plan:  1. Resting tremor, right upper extremity  2. Cervical spondylosis  The patient has developed a true resting tremor involving the right upper extremity. There are no other features of parkinsonism on the clinical examination, but the patient will need to be followed over time for this issue. The tremor does not result in any functional alteration for this patient. The patient will be set up for MRI of the brain. He will follow-up in 6 months.  The patient is followed through the [facility], Dr. Francia Greaves. Fax number is [phone].  Jill Alexanders MD 06/02/2015 6:43 PM  [facility] [address] [place], [place] [address]  Phone [phone] Fax [phone]

## 2015-06-03 ENCOUNTER — Telehealth: Payer: Self-pay | Admitting: Neurology

## 2015-06-03 NOTE — Telephone Encounter (Signed)
I called the patient. The patient indicates that the MRI to be scheduled needs to go through [facility], I will let the scheduler know about this.

## 2015-06-03 NOTE — Telephone Encounter (Signed)
Patient came by the office to speak with me regarding his MRI. Patient has Administrator, [employer] and [employer]. Patient would like to file[facility]go to a [facility] that way it is covered @ 100%. In order for him To have a MRI at a [facility] facility the order has to come from a [facility] doctor. Patient advise me that he would call his PCP from [place] and see if he can order the MRI.

## 2015-06-20 ENCOUNTER — Ambulatory Visit
Admission: RE | Admit: 2015-06-20 | Discharge: 2015-06-20 | Disposition: A | Discharge status: Home / Self Care | Payer: Non-veteran care | Source: Ambulatory Visit | Attending: Neurology | Admitting: Neurology

## 2015-06-20 ENCOUNTER — Telehealth: Payer: Self-pay | Admitting: Neurology

## 2015-06-20 DIAGNOSIS — R251 Tremor, unspecified: Secondary | ICD-10-CM

## 2015-06-20 DIAGNOSIS — M47812 Spondylosis without myelopathy or radiculopathy, cervical region: Secondary | ICD-10-CM

## 2015-06-20 NOTE — Telephone Encounter (Signed)
I called patient. The MRI shows very minimal white matter changes. No etiology of the tremor is seen. The patient will be seen in 6 months to evaluate for any evolving signs of parkinsonism.   MRI brain 06/20/15:  IMPRESSION: This MRI of the brain without contrast shows the following: 1. A few scattered T2 hyperintense foci consistent with mild age appropriate chronic microvascular ischemic changes. There is also mild age appropriate cortical atrophy. 2. There are no acute findings 3. 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

## 2015-09-05 DIAGNOSIS — H25011 Cortical age-related cataract, right eye: Secondary | ICD-10-CM | POA: Diagnosis not present

## 2015-09-05 DIAGNOSIS — H25012 Cortical age-related cataract, left eye: Secondary | ICD-10-CM | POA: Diagnosis not present

## 2015-09-05 DIAGNOSIS — H40003 Preglaucoma, unspecified, bilateral: Secondary | ICD-10-CM | POA: Diagnosis not present

## 2015-09-05 DIAGNOSIS — H2512 Age-related nuclear cataract, left eye: Secondary | ICD-10-CM | POA: Diagnosis not present

## 2015-09-05 DIAGNOSIS — H2511 Age-related nuclear cataract, right eye: Secondary | ICD-10-CM | POA: Diagnosis not present

## 2015-09-23 DIAGNOSIS — H25011 Cortical age-related cataract, right eye: Secondary | ICD-10-CM | POA: Diagnosis not present

## 2015-09-23 DIAGNOSIS — H2511 Age-related nuclear cataract, right eye: Secondary | ICD-10-CM | POA: Diagnosis not present

## 2015-10-20 DIAGNOSIS — H2511 Age-related nuclear cataract, right eye: Secondary | ICD-10-CM | POA: Diagnosis not present

## 2015-10-21 DIAGNOSIS — H5211 Myopia, right eye: Secondary | ICD-10-CM | POA: Diagnosis not present

## 2015-10-21 DIAGNOSIS — H2512 Age-related nuclear cataract, left eye: Secondary | ICD-10-CM | POA: Diagnosis not present

## 2015-10-21 DIAGNOSIS — H2511 Age-related nuclear cataract, right eye: Secondary | ICD-10-CM | POA: Diagnosis not present

## 2015-10-31 DIAGNOSIS — H5211 Myopia, right eye: Secondary | ICD-10-CM | POA: Diagnosis not present

## 2015-11-03 DIAGNOSIS — H2512 Age-related nuclear cataract, left eye: Secondary | ICD-10-CM | POA: Diagnosis not present

## 2015-11-03 DIAGNOSIS — H5212 Myopia, left eye: Secondary | ICD-10-CM | POA: Diagnosis not present

## 2015-11-10 DIAGNOSIS — H5211 Myopia, right eye: Secondary | ICD-10-CM | POA: Diagnosis not present

## 2015-11-25 DIAGNOSIS — H52222 Regular astigmatism, left eye: Secondary | ICD-10-CM | POA: Diagnosis not present

## 2015-11-30 ENCOUNTER — Ambulatory Visit: Payer: Non-veteran care | Admitting: Neurology

## 2016-04-17 DIAGNOSIS — H524 Presbyopia: Secondary | ICD-10-CM | POA: Diagnosis not present

## 2016-04-26 ENCOUNTER — Encounter: Payer: Self-pay | Admitting: Internal Medicine

## 2016-09-20 ENCOUNTER — Emergency Department (HOSPITAL_COMMUNITY)
Admission: EM | Admit: 2016-09-20 | Discharge: 2016-09-20 | Disposition: A | Discharge status: Home / Self Care | Payer: Medicare Other | Attending: Emergency Medicine | Admitting: Emergency Medicine

## 2016-09-20 ENCOUNTER — Emergency Department (HOSPITAL_COMMUNITY): Payer: Medicare Other

## 2016-09-20 ENCOUNTER — Encounter (HOSPITAL_COMMUNITY): Payer: Self-pay | Admitting: Emergency Medicine

## 2016-09-20 DIAGNOSIS — Z87891 Personal history of nicotine dependence: Secondary | ICD-10-CM | POA: Insufficient documentation

## 2016-09-20 DIAGNOSIS — I10 Essential (primary) hypertension: Secondary | ICD-10-CM | POA: Diagnosis not present

## 2016-09-20 DIAGNOSIS — Z79899 Other long term (current) drug therapy: Secondary | ICD-10-CM | POA: Diagnosis not present

## 2016-09-20 DIAGNOSIS — R0789 Other chest pain: Secondary | ICD-10-CM | POA: Diagnosis not present

## 2016-09-20 DIAGNOSIS — Z7982 Long term (current) use of aspirin: Secondary | ICD-10-CM | POA: Insufficient documentation

## 2016-09-20 HISTORY — DX: Pure hypercholesterolemia, unspecified: E78.00

## 2016-09-20 HISTORY — DX: Emphysema, unspecified: J43.9

## 2016-09-20 HISTORY — DX: Essential (primary) hypertension: I10

## 2016-09-20 LAB — CBC
HCT: 40.4 % (ref 39.0–52.0)
Hemoglobin: 14 g/dL (ref 13.0–17.0)
MCH: 32.9 pg (ref 26.0–34.0)
MCHC: 34.7 g/dL (ref 30.0–36.0)
MCV: 94.8 fL (ref 78.0–100.0)
Platelets: 172 10*3/uL (ref 150–400)
RBC: 4.26 MIL/uL (ref 4.22–5.81)
RDW: 12.2 % (ref 11.5–15.5)
WBC: 3.2 10*3/uL — ABNORMAL LOW (ref 4.0–10.5)

## 2016-09-20 LAB — D-DIMER, QUANTITATIVE: D-Dimer, Quant: 0.3 ug/mL-FEU (ref 0.00–0.50)

## 2016-09-20 LAB — BASIC METABOLIC PANEL
Anion gap: 6 (ref 5–15)
BUN: 11 mg/dL (ref 6–20)
CO2: 27 mmol/L (ref 22–32)
CREATININE: 0.86 mg/dL (ref 0.61–1.24)
Calcium: 9.3 mg/dL (ref 8.9–10.3)
Chloride: 107 mmol/L (ref 101–111)
GFR calc Af Amer: >60 mL/min (ref >60)
GFR calc non Af Amer: >60 mL/min (ref >60)
Glucose, Bld: 107 mg/dL — ABNORMAL HIGH (ref 65–99)
Potassium: 3.8 mmol/L (ref 3.5–5.1)
SODIUM: 140 mmol/L (ref 135–145)

## 2016-09-20 LAB — I-STAT TROPONIN, ED
Troponin i, poc: 0 ng/mL (ref 0.00–0.08)
Troponin i, poc: 0 ng/mL (ref 0.00–0.08)

## 2016-09-20 MED ORDER — SUCRALFATE 1 GM/10ML PO SUSP: 1.0000 g | Freq: Three times a day (TID) | ORAL | 0 refills | Status: DC

## 2016-09-20 MED ORDER — RANITIDINE HCL 150 MG PO CAPS: 150.0000 mg | ORAL_CAPSULE | Freq: Two times a day (BID) | ORAL | 0 refills | Status: DC

## 2016-09-20 MED ORDER — GI COCKTAIL ~~LOC~~
30.0000 mL | Freq: Once | ORAL | Status: AC
  Administered 2016-09-20: 30 mL via ORAL
  Filled 2016-09-20: qty 30

## 2016-09-20 NOTE — ED Triage Notes (Signed)
patient reports having intermittent central chest pains. Patient states last night while sleeping he had severe pain that felt like a throb.  Patient reports pain has subsided today but called [facility] and was told to either call 911 or go to ED.  Patient reports getting headache with chest pain.

## 2016-09-20 NOTE — ED Provider Notes (Signed)
[facility] DEPT Provider Note   CSN: [id number] Arrival date & time: 09/20/16  [id number]     History   Chief Complaint Chief Complaint  Patient presents with  . Chest Pain    HPI Cross Goldbeck is a 71 y.o. male.  HPI   71 year old male with past medical history as below including chronic chest pain presents with atypical chest pain. The patient states that for the last several months to years, the patient has had intermittent episodes of sharp central chest pain that radiates up towards his mouth. The pain resolves if he occasionally hits his chest as well as sits upright. It does intermittently worsen with eating but not always. Denies any association with exertion and he denies any shortness of breath, nausea, or diaphoresis. The pain has been increasingly frequent over the last several weeks ago decided to present for evaluation. He has had negative cardiac workups in the past. Denies any other medication changes.  Past Medical History:  Diagnosis Date  . Acid reflux   . Alcohol abuse    quit 10 y ago  . Arthritis   . Cervical spondylosis without myelopathy 06/02/2015  . Emphysema lung ([place])   . High cholesterol   . Hypertension   . Tremor 06/02/2015    Patient Active Problem List   Diagnosis Date Noted  . Tremor 06/02/2015  . Cervical spondylosis without myelopathy 06/02/2015  . 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 HIP PAIN 11/27/2007  . Essential hypertension 07/25/2007  . Atrial fibrillation ([place]) 07/25/2007  . GERD 07/25/2007  . ERECTILE DYSFUNCTION 07/25/2007  . LOW BACK PAIN 07/25/2007    Past Surgical History:  Procedure Laterality Date  . TONSILLECTOMY    . VASECTOMY         Home Medications    Prior to Admission medications   Medication Sig Start Date End Date Taking? Authorizing Provider  aspirin EC 81 MG tablet Take 1 tablet (81 mg total) by mouth daily. 06/09/14  Yes Aleksei [name] V, MD  budesonide-formoterol (SYMBICORT) 80-4.5  MCG/ACT inhaler Inhale 2 puffs into the lungs 2 (two) times daily.   Yes Historical Provider, MD  folic acid (FOLVITE) 1 MG tablet Take 1 mg by mouth daily.   Yes Historical Provider, MD  pantoprazole (PROTONIX) 40 MG tablet Take 40 mg by mouth daily.   Yes Historical Provider, MD  pravastatin (PRAVACHOL) 20 MG tablet Take 20 mg by mouth daily.   Yes Historical Provider, MD  Tiotropium Bromide Monohydrate 2.5 MCG/ACT AERS Inhale 1 puff into the lungs daily.   Yes Historical Provider, MD  ranitidine (ZANTAC) 150 MG capsule Take 1 capsule (150 mg total) by mouth 2 (two) times daily. 09/20/16 09/27/16  Duffy Bruce, MD  sucralfate (CARAFATE) 1 GM/10ML suspension Take 10 mLs (1 g total) by mouth 4 (four) times daily -  with meals and at bedtime. 09/20/16   Duffy Bruce, MD    Family History Family History  Problem Relation Age of Onset  . Cancer Brother 82    prostate ca  . Irregular heart beat Mother   . Irregular heart beat Father   . Tremor Father   . Cancer Sister     Social History Social History  Substance Use Topics  . Smoking status: Former Smoker    Quit date: 06/09/2005  . Smokeless tobacco: Never Used  . Alcohol use No     Comment: quit 10 years ago     Allergies   Enalapril maleate   Review  of Systems Review of Systems  Constitutional: Negative for chills, fatigue and fever.  HENT: Negative for congestion and rhinorrhea.   Eyes: Negative for visual disturbance.  Respiratory: Negative for cough, shortness of breath and wheezing.   Cardiovascular: Positive for chest pain. Negative for leg swelling.  Gastrointestinal: Negative for abdominal pain, diarrhea, nausea and vomiting.  Genitourinary: Negative for dysuria and flank pain.  Musculoskeletal: Negative for neck pain and neck stiffness.  Skin: Negative for rash and wound.  Allergic/Immunologic: Negative for immunocompromised state.  Neurological: Negative for syncope, weakness and headaches.  All other systems  reviewed and are negative.    Physical Exam Updated Vital Signs BP 118/74   Pulse 68   Temp 98 F (36.7 C) (Oral)   Resp 11   Ht 5\' 11"  (1.803 m)   Wt 175 lb (79.4 kg)   SpO2 98%   BMI 24.41 kg/m   Physical Exam  Constitutional: He is oriented to person, place, and time. He appears well-developed and well-nourished. No distress.  HENT:  Head: Normocephalic and atraumatic.  Eyes: Conjunctivae are normal.  Neck: Neck supple.  Cardiovascular: Normal rate, regular rhythm and normal heart sounds.  Exam reveals no friction rub.   No murmur heard. Pulmonary/Chest: Effort normal and breath sounds normal. No respiratory distress. He has no wheezes. He has no rales.  Abdominal: He exhibits no distension.  Musculoskeletal: He exhibits no edema.  Neurological: He is alert and oriented to person, place, and time. He exhibits normal muscle tone.  Skin: Skin is warm. Capillary refill takes less than 2 seconds.  Psychiatric: He has a normal mood and affect.  Nursing note and vitals reviewed.    ED Treatments / Results  Labs (all labs ordered are listed, but only abnormal results are displayed) Labs Reviewed  BASIC METABOLIC PANEL - Abnormal; Notable for the following:       Result Value   Glucose, Bld 107 (*)    All other components within normal limits  CBC - Abnormal; Notable for the following:    WBC 3.2 (*)    All other components within normal limits  D-DIMER, QUANTITATIVE (NOT AT [facility])  I-STAT TROPOININ, ED  I-STAT TROPOININ, ED    EKG  EKG Interpretation  Date/Time:  Thursday September 20 2016 10:03:16 EST Ventricular Rate:  100 PR Interval:    QRS Duration: 88 QT Interval:  348 QTC Calculation: 449 R Axis:   84 Text Interpretation:  Sinus tachycardia Right atrial enlargement Borderline right axis deviation No significant change since last tracing Confirmed by Marabella Popiel MD, Machell Wirthlin [phone]) on 09/20/2016 10:57:10 AM       Radiology Dg Chest 2 View  Result Date:  09/20/2016 CLINICAL DATA:  Chest pain. EXAM: CHEST  2 VIEW COMPARISON:  11/30/2012. FINDINGS: Mediastinum and hilar structures normal. Lungs are clear. No pleural effusion or pneumothorax. Degenerative changes thoracic spine . IMPRESSION: No acute cardiopulmonary disease . Electronically Signed   By: Marcello Moores  Register   On: 09/20/2016 10:22    Procedures Procedures (including critical care time)  Medications Ordered in ED Medications  gi cocktail (Maalox,Lidocaine,Donnatal) (30 mLs Oral Given 09/20/16 1138)     Initial Impression / Assessment and Plan / ED Course  I have reviewed the triage vital signs and the nursing notes.  Pertinent labs & imaging results that were available during my care of the patient were reviewed by me and considered in my medical decision making (see chart for details).     71 year old male with  past medical history as above who presents with a typical upper chest pain. On arrival, vital signs are stable. History is highly atypical and I suspect patient may have musculoskeletal pain versus GI related pain as it is associated with eating. His EKG is nonischemic, delta troponin is negative, which is especially reassuring in the presence of constant pain for at least several days and HEART score <3. Do not suspect ACS. D-dimer negative and I do not suspect PE or dissection. Will treat as nonspecific chest pain with trial of antacids as well as outpatient follow-up for possible provocative testing.  Final Clinical Impressions(s) / ED Diagnoses   Final diagnoses:  Atypical chest pain    New Prescriptions Discharge Medication List as of 09/20/2016  2:08 PM    START taking these medications   Details  ranitidine (ZANTAC) 150 MG capsule Take 1 capsule (150 mg total) by mouth 2 (two) times daily., Starting Thu 09/20/2016, Until Thu 09/27/2016, Print    sucralfate (CARAFATE) 1 GM/10ML suspension Take 10 mLs (1 g total) by mouth 4 (four) times daily -  with meals and at  bedtime., Starting Thu 09/20/2016, Print         Duffy Bruce, MD 09/20/16 [phone]

## 2016-09-20 NOTE — ED Notes (Signed)
Discharge instructions, follow up care, and rx x2 reviewed with patient. Patient verbalized understanding. 

## 2016-09-20 NOTE — ED Notes (Signed)
Lab states they will run d-dimer on previously collected labs

## 2017-01-05 ENCOUNTER — Emergency Department (HOSPITAL_COMMUNITY)
Admission: EM | Admit: 2017-01-05 | Discharge: 2017-01-05 | Disposition: A | Discharge status: Home / Self Care | Payer: Medicare Other | Attending: Emergency Medicine | Admitting: Emergency Medicine

## 2017-01-05 ENCOUNTER — Emergency Department (HOSPITAL_COMMUNITY): Payer: Medicare Other

## 2017-01-05 ENCOUNTER — Encounter (HOSPITAL_COMMUNITY): Payer: Self-pay | Admitting: Emergency Medicine

## 2017-01-05 DIAGNOSIS — Z7982 Long term (current) use of aspirin: Secondary | ICD-10-CM | POA: Insufficient documentation

## 2017-01-05 DIAGNOSIS — R0602 Shortness of breath: Secondary | ICD-10-CM | POA: Diagnosis not present

## 2017-01-05 DIAGNOSIS — Z87891 Personal history of nicotine dependence: Secondary | ICD-10-CM | POA: Diagnosis not present

## 2017-01-05 DIAGNOSIS — I10 Essential (primary) hypertension: Secondary | ICD-10-CM | POA: Diagnosis not present

## 2017-01-05 DIAGNOSIS — J441 Chronic obstructive pulmonary disease with (acute) exacerbation: Secondary | ICD-10-CM | POA: Insufficient documentation

## 2017-01-05 LAB — BLOOD GAS, ARTERIAL
Acid-Base Excess: 0.1 mmol/L (ref 0.0–2.0)
Bicarbonate: 24.4 mmol/L (ref 20.0–28.0)
Drawn by: 232811
O2 Saturation: 92.6 %
PATIENT TEMPERATURE: 97.4
PCO2 ART: 39.7 mmHg (ref 32.0–48.0)
PO2 ART: 64.4 mmHg — AB (ref 83.0–108.0)
pH, Arterial: 7.403 (ref 7.350–7.450)

## 2017-01-05 LAB — BASIC METABOLIC PANEL
ANION GAP: 8 (ref 5–15)
BUN: 11 mg/dL (ref 6–20)
CHLORIDE: 108 mmol/L (ref 101–111)
CO2: 27 mmol/L (ref 22–32)
Calcium: 9.3 mg/dL (ref 8.9–10.3)
Creatinine, Ser: 0.98 mg/dL (ref 0.61–1.24)
GFR calc Af Amer: >60 mL/min (ref >60)
GFR calc non Af Amer: >60 mL/min (ref >60)
GLUCOSE: 106 mg/dL — AB (ref 65–99)
POTASSIUM: 3.9 mmol/L (ref 3.5–5.1)
Sodium: 143 mmol/L (ref 135–145)

## 2017-01-05 LAB — CBC WITH DIFFERENTIAL/PLATELET
Basophils Absolute: 0 10*3/uL (ref 0.0–0.1)
Basophils Relative: 1 %
Eosinophils Absolute: 0.4 10*3/uL (ref 0.0–0.7)
Eosinophils Relative: 6 %
HEMATOCRIT: 40.5 % (ref 39.0–52.0)
Hemoglobin: 13.9 g/dL (ref 13.0–17.0)
LYMPHS ABS: 1.7 10*3/uL (ref 0.7–4.0)
LYMPHS PCT: 26 %
MCH: 32.9 pg (ref 26.0–34.0)
MCHC: 34.3 g/dL (ref 30.0–36.0)
MCV: 95.7 fL (ref 78.0–100.0)
MONOS PCT: 9 %
Monocytes Absolute: 0.6 10*3/uL (ref 0.1–1.0)
NEUTROS ABS: 3.8 10*3/uL (ref 1.7–7.7)
Neutrophils Relative %: 58 %
Platelets: 199 10*3/uL (ref 150–400)
RBC: 4.23 MIL/uL (ref 4.22–5.81)
RDW: 12.6 % (ref 11.5–15.5)
WBC: 6.5 10*3/uL (ref 4.0–10.5)

## 2017-01-05 LAB — BRAIN NATRIURETIC PEPTIDE: B Natriuretic Peptide: 45.6 pg/mL (ref 0.0–100.0)

## 2017-01-05 MED ORDER — IPRATROPIUM-ALBUTEROL 0.5-2.5 (3) MG/3ML IN SOLN
3.0000 mL | Freq: Once | RESPIRATORY_TRACT | Status: AC
  Administered 2017-01-05: 3 mL via RESPIRATORY_TRACT
  Filled 2017-01-05: qty 3

## 2017-01-05 MED ORDER — METHYLPREDNISOLONE SODIUM SUCC 125 MG IJ SOLR
125.0000 mg | Freq: Once | INTRAMUSCULAR | Status: AC
  Administered 2017-01-05: 125 mg via INTRAVENOUS
  Filled 2017-01-05: qty 2

## 2017-01-05 MED ORDER — IPRATROPIUM-ALBUTEROL 0.5-2.5 (3) MG/3ML IN SOLN
RESPIRATORY_TRACT | Status: AC
  Filled 2017-01-05: qty 3

## 2017-01-05 MED ORDER — ALBUTEROL SULFATE HFA 108 (90 BASE) MCG/ACT IN AERS: 2.0000 | INHALATION_SPRAY | RESPIRATORY_TRACT | 0 refills | Status: AC | PRN

## 2017-01-05 MED ORDER — IPRATROPIUM-ALBUTEROL 0.5-2.5 (3) MG/3ML IN SOLN
3.0000 mL | Freq: Once | RESPIRATORY_TRACT | Status: AC
  Administered 2017-01-05: 3 mL via RESPIRATORY_TRACT

## 2017-01-05 MED ORDER — PREDNISONE 50 MG PO TABS: 50.0000 mg | ORAL_TABLET | Freq: Every day | ORAL | 0 refills | Status: DC

## 2017-01-05 NOTE — ED Provider Notes (Addendum)
[place] [facility] DEPT Provider Note   CSN: [id number] Arrival date & time: 01/05/17  0246   By signing my name below, I, Lee [name], attest that this documentation has been prepared under the direction and in the presence of Delora Fuel, MD. Electronically Signed: Lise Auer, ED Scribe. 01/05/17. 3:11 AM.  History   Chief Complaint Chief Complaint  Patient presents with  . Shortness of Breath   The history is provided by the patient. No language interpreter was used.    HPI Comments: Lee [name] is a 71 y.o. male with a PMHx of HTN and Emphysema who presents to the Emergency Department complaining of gradually worsening, persistent acute on chronic shortness of breath that began five hours ago. Pt notes associated weakness. Pt notes today around five hours ago he began to have difficulty breathing. He took his Symbicort and Spirivia and laid down but notes his breathing worsened. Not currently on Albuterol. Denies previous hospitalization for his hx of emphysema. Denies chest pain, fever, chills, or coughing, no other acute associated symptoms noted at this time.   Past Medical History:  Diagnosis Date  . Acid reflux   . Alcohol abuse    quit 10 y ago  . Arthritis   . Cervical spondylosis without myelopathy 06/02/2015  . Emphysema lung ([place])   . High cholesterol   . Hypertension   . Tremor 06/02/2015   Patient Active Problem List   Diagnosis Date Noted  . Tremor 06/02/2015  . Cervical spondylosis without myelopathy 06/02/2015  . 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 HIP PAIN 11/27/2007  . Essential hypertension 07/25/2007  . Atrial fibrillation ([place]) 07/25/2007  . GERD 07/25/2007  . ERECTILE DYSFUNCTION 07/25/2007  . LOW BACK PAIN 07/25/2007   Past Surgical History:  Procedure Laterality Date  . TONSILLECTOMY    . VASECTOMY      Home Medications    Prior to Admission medications   Medication Sig Start Date End Date Taking? Authorizing Provider  aspirin  EC 81 MG tablet Take 1 tablet (81 mg total) by mouth daily. 06/09/14   [name], [name] Lacks, MD  budesonide-formoterol (SYMBICORT) 80-4.5 MCG/ACT inhaler Inhale 2 puffs into the lungs 2 (two) times daily.    [provider]  folic acid (FOLVITE) 1 MG tablet Take 1 mg by mouth daily.    [provider]  pantoprazole (PROTONIX) 40 MG tablet Take 40 mg by mouth daily.    [provider]  pravastatin (PRAVACHOL) 20 MG tablet Take 20 mg by mouth daily.    [provider]  ranitidine (ZANTAC) 150 MG capsule Take 1 capsule (150 mg total) by mouth 2 (two) times daily. 09/20/16 09/27/16  Duffy Bruce, MD  sucralfate (CARAFATE) 1 GM/10ML suspension Take 10 mLs (1 g total) by mouth 4 (four) times daily -  with meals and at bedtime. 09/20/16   Duffy Bruce, MD  Tiotropium Bromide Monohydrate 2.5 MCG/ACT AERS Inhale 1 puff into the lungs daily.    [provider]   Family History Family History  Problem Relation Age of Onset  . Cancer Brother 21       prostate ca  . Irregular heart beat Mother   . Irregular heart beat Father   . Tremor Father   . Cancer Sister    Social History Social History  Substance Use Topics  . Smoking status: Former Smoker    Quit date: 06/09/2005  . Smokeless tobacco: Never Used  . Alcohol use No  Comment: quit 10 years ago   Allergies   Enalapril maleate  Review of Systems Review of Systems  Constitutional: Negative for chills and fever.  Respiratory: Positive for shortness of breath. Negative for cough.   Cardiovascular: Negative for chest pain.  Neurological: Positive for weakness.   Physical Exam Updated Vital Signs There were no vitals taken for this visit.  Physical Exam  Constitutional: He is oriented to person, place, and time. He appears well-developed and well-nourished.  Moderate respiratory distress using accessory muscles with respiration. Only able to speak two or three words between breaths.     HENT:  Head: Normocephalic and atraumatic.  Eyes: EOM are normal. Pupils are equal, round, and reactive to light.  Neck: Normal range of motion. Neck supple. No JVD present.  Cardiovascular: Normal rate, regular rhythm and normal heart sounds.   No murmur heard. Pulmonary/Chest: He has no wheezes. He has rales. He exhibits no tenderness.  Markedly dimensioned air flow. Bibasilar rales half way up.   Abdominal: Soft. Bowel sounds are normal. He exhibits no distension and no mass. There is no tenderness.  Musculoskeletal: Normal range of motion. He exhibits no edema.  Lymphadenopathy:    He has no cervical adenopathy.  Neurological: He is alert and oriented to person, place, and time. No cranial nerve deficit. He exhibits normal muscle tone. Coordination normal.  Skin: Skin is warm and dry. No rash noted.  Psychiatric: He has a normal mood and affect. His behavior is normal. Judgment and thought content normal.  Nursing note and vitals reviewed.  ED Treatments / Results  DIAGNOSTIC STUDIES: Oxygen Saturation is 97% on RA, adequate by my interpretation.   COORDINATION OF CARE: 3:04 AM-Discussed next steps with pt. Pt verbalized understanding and is agreeable with the plan.   Labs (all labs ordered are listed, but only abnormal results are displayed) Labs Reviewed - No data to display  EKG  EKG Interpretation None       Radiology No results found.  Procedures Procedures (including critical care time)  Medications Ordered in ED Medications - No data to display   Initial Impression / Assessment and Plan / ED Course  I have reviewed the triage vital signs and the nursing notes.  Pertinent labs & imaging results that were available during my care of the patient were reviewed by me and considered in my medical decision making (see chart for details).  COPD exacerbation. Patient presented in respiratory distress. However, he was maintaining good oxygen saturation. He was  given a dose of methylprednisolone intravenously and given nebulizer treatment with albuterol. Will check ABG to make sure he is not retaining carbon dioxide. Old records were reviewed, and he does have a prior ED visit for COPD.  Following nebulizer treatment, patient was doing much better. He was no longer using accessory muscles of respiration and was able to speak in complete sentences. On reexam, lungs at much improved airflow and no significant wheezing. ABG was worrisome in that pH was normal and PCO2 was normal. It was decided to give him a second nebulizer treatment. Following this, he was observed in the ED. He continued to be resting comfortably with no further respiratory distress. He was noted to be able and played to the bathroom without dyspnea and states he is back to his baseline. He was felt to be safe to discharge at this point. Of note, he does not have a rescue inhaler at home. He is discharged with prescription for prednisone and albuterol inhaler.  Follow-up with PCP in 5 days. Return precautions discussed.  Final Clinical Impressions(s) / ED Diagnoses   Final diagnoses:  COPD exacerbation (HCC)    New Prescriptions New Prescriptions   ALBUTEROL (PROVENTIL HFA;VENTOLIN HFA) 108 (90 BASE) MCG/ACT INHALER    Inhale 2 puffs into the lungs every 4 (four) hours as needed for wheezing or shortness of breath.   PREDNISONE (DELTASONE) 50 MG TABLET    Take 1 tablet (50 mg total) by mouth daily.    I personally performed the services described in this documentation, which was scribed in my presence. The recorded information has been reviewed and is accurate.    Delora Fuel, MD 12/15/31 5825    Delora Fuel, MD 18/98/42 [phone]

## 2017-01-05 NOTE — Discharge Instructions (Signed)
Return if breathing gets worse and does not respond to the rescue inhaler.

## 2017-01-05 NOTE — ED Notes (Signed)
Patient ambulated with no c/o of SOB. Oxygen saturation remain at 95% walking.

## 2017-04-18 DIAGNOSIS — Z961 Presence of intraocular lens: Secondary | ICD-10-CM | POA: Diagnosis not present

## 2017-06-21 IMAGING — DX DG CHEST 1V PORT
2 series · 2 of 2 positions shown · non-contrast
Comparison: 09/20/2016

CLINICAL DATA: Shortness of breath for 5 hours.  Weakness.

EXAM:
PORTABLE CHEST 1 VIEW

[chest ap (1 of 2)]
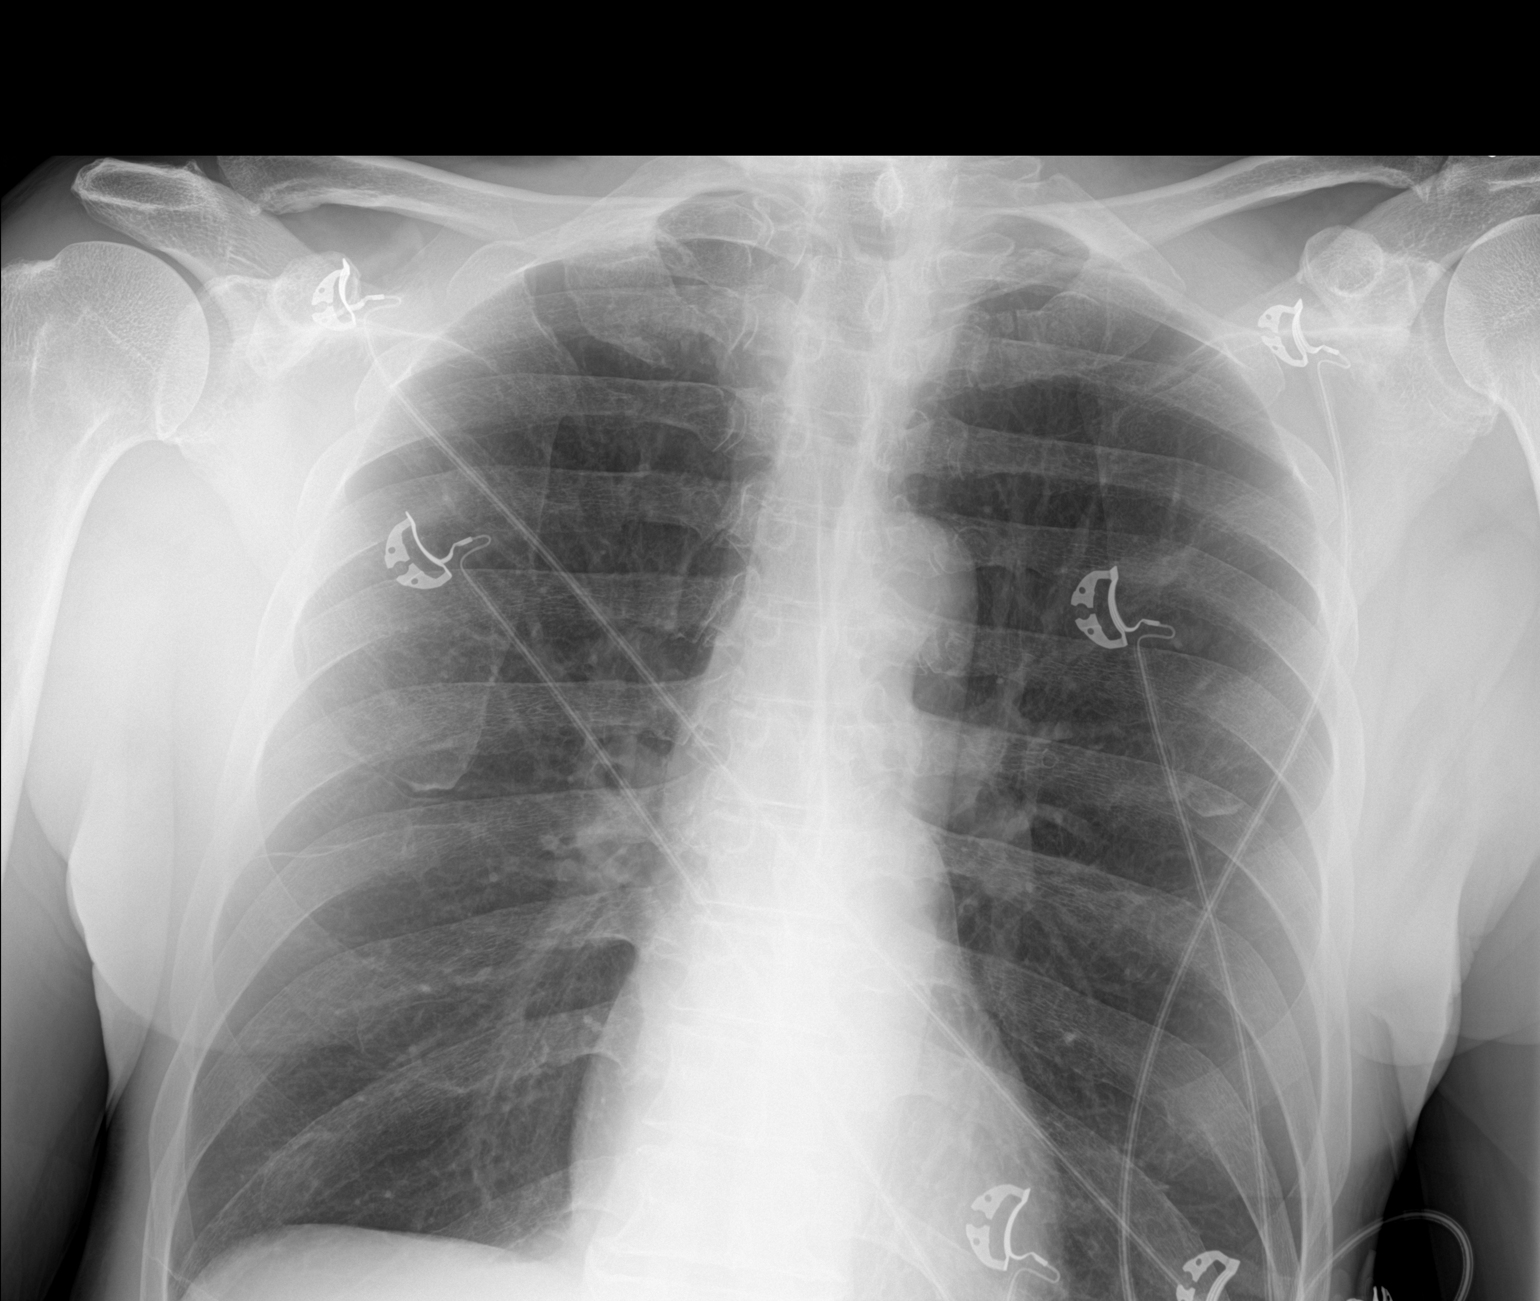

[chest ap (2 of 2)]
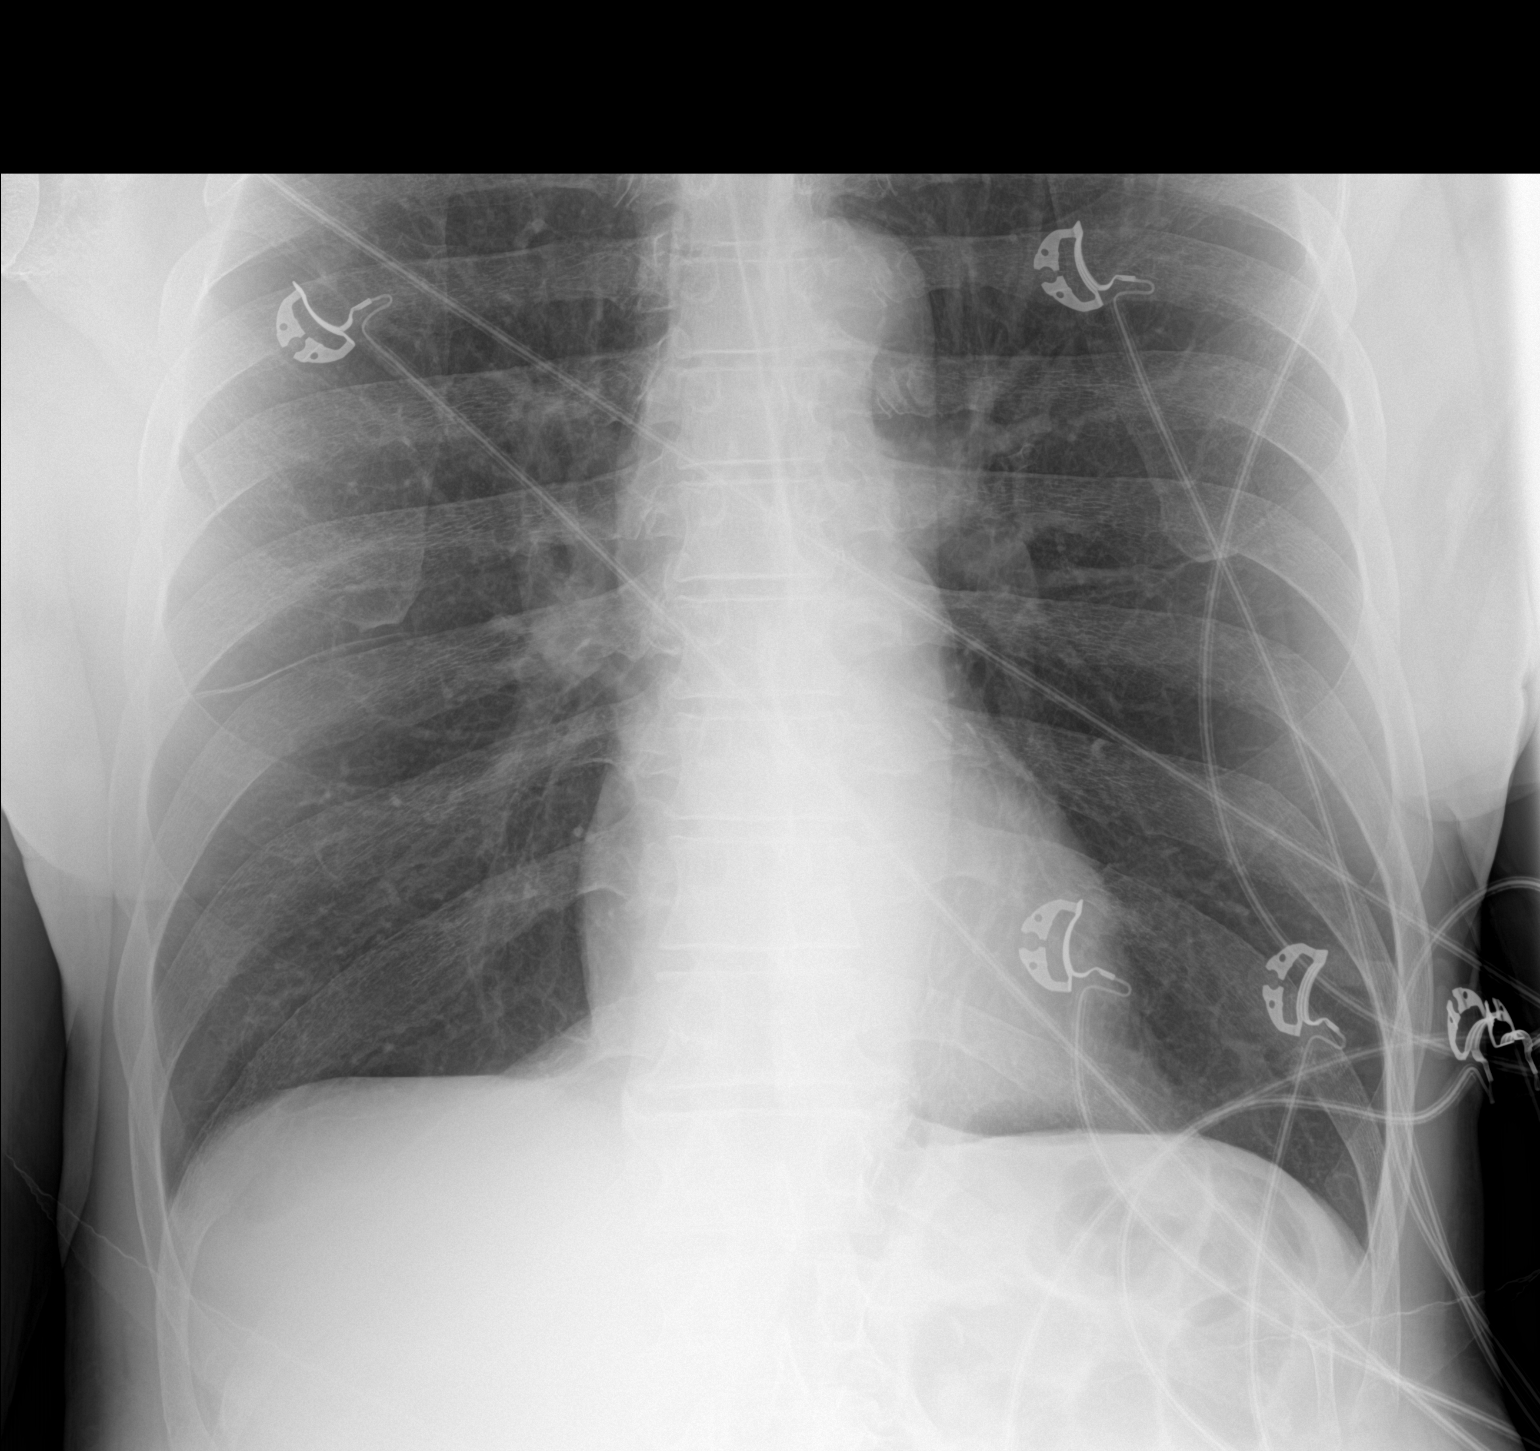

[2 of 2 positions shown; findings below may reference images not displayed]

FINDINGS: Stable hyperinflation. The cardiomediastinal contours are normal.
The lungs are clear. Pulmonary vasculature is normal. No
consolidation, pleural effusion, or pneumothorax. No acute osseous
abnormalities are seen.
IMPRESSION: Stable hyperinflation suggesting emphysema/COPD. No acute
abnormality.

## 2017-08-15 ENCOUNTER — Encounter: Payer: Self-pay | Admitting: Physical Medicine & Rehabilitation

## 2017-08-27 ENCOUNTER — Encounter: Payer: Non-veteran care | Attending: Physical Medicine & Rehabilitation

## 2017-08-27 ENCOUNTER — Encounter: Payer: Self-pay | Admitting: Physical Medicine & Rehabilitation

## 2017-08-27 ENCOUNTER — Ambulatory Visit (HOSPITAL_BASED_OUTPATIENT_CLINIC_OR_DEPARTMENT_OTHER): Payer: Non-veteran care | Admitting: Physical Medicine & Rehabilitation

## 2017-08-27 VITALS — BP 143/83 | HR 66

## 2017-08-27 DIAGNOSIS — M4802 Spinal stenosis, cervical region: Secondary | ICD-10-CM | POA: Insufficient documentation

## 2017-08-27 DIAGNOSIS — M47812 Spondylosis without myelopathy or radiculopathy, cervical region: Secondary | ICD-10-CM

## 2017-08-27 DIAGNOSIS — Z9889 Other specified postprocedural states: Secondary | ICD-10-CM | POA: Insufficient documentation

## 2017-08-27 DIAGNOSIS — M4316 Spondylolisthesis, lumbar region: Secondary | ICD-10-CM

## 2017-08-27 DIAGNOSIS — I10 Essential (primary) hypertension: Secondary | ICD-10-CM | POA: Insufficient documentation

## 2017-08-27 DIAGNOSIS — Z87891 Personal history of nicotine dependence: Secondary | ICD-10-CM | POA: Insufficient documentation

## 2017-08-27 DIAGNOSIS — M48061 Spinal stenosis, lumbar region without neurogenic claudication: Secondary | ICD-10-CM | POA: Diagnosis not present

## 2017-08-27 NOTE — Patient Instructions (Signed)
Please call if your pain worsens

## 2017-08-27 NOTE — Progress Notes (Addendum)
Subjective:  Physical Medicine and Rehab, Interventional Pain Management consultation  Patient ID: Lee [name], male    DOB: 1946/07/30, 72 y.o.   MRN: [id number] Neurosurgery Consult Dr Radonna Ricker 08/16/2015 "There is cervical spondylosis with stenosis at C3-4, C4-5 and C5-6. There is some cord compression present, although there is not yet signal change within the cord. I have recommended ACDF at C3-4, C4-5 and C5-6. I reviewed this in detail with the patient including potential risks, benefits and alternatives. He understands that improvement cannot be guaranteed. There is a grade 1 spondylolisthesis at L4-5 with associated due to lumbar stenosis. I think this is a major contributor to his low back pain. In addition, he has some dorsiflexion weakness of the left great toe which is probably due to nerve compression. I have recommended TLIF at L4-5"  HPI CC:  Right shoulder > neck> low back pain  72 year old male who [facility] and low back [facility].  Complaints have been ongoing for at least 2 years.  Had neurosurgery eval as above but decided not pursue surgery.  He has been able to manage his pain by taking some herbal medications as well as exercise.  He is independent with all his self-care and mobility.  He is able to drive.  He is retired. He has no progressive weakness or numbness in his upper or lower limbs.  No bowel or bladder dysfunction.  Right shoulder pain better after PT, and acupuncture Finished PT ~ 1.5 mo ago  Takes Hydrocodone less than once per month  Pain Inventory Average Pain 8 Pain Right Now 7 My pain is sharp and burning  In the last 24 hours, has pain interfered with the following? General activity 7 Relation with others 0 Enjoyment of life 0 What TIME of day is your pain at its worst? morning Sleep (in general) Poor  Pain is worse with: bending, inactivity, standing and some activites Pain improves with:  therapy/exercise Relief from Meds: 0  Mobility walk without assistance ability to climb steps?  yes do you drive?  yes  Function retired  Neuro/Psych bladder control problems dizziness depression  Prior Studies Any changes since last visit?  no  Physicians involved in your care Any changes since last visit?  no   Family History  Problem Relation Age of Onset  . Cancer Brother 78       prostate ca  . Irregular heart beat Mother   . Irregular heart beat Father   . Tremor Father   . Cancer Sister    Social History   Socioeconomic History  . Marital status: Single    Spouse name: None  . Number of children: 0  . Years of education: 65  . Highest education level: None  Social Needs  . Financial resource strain: None  . Food insecurity - worry: None  . Food insecurity - inability: None  . Transportation needs - medical: None  . Transportation needs - non-medical: None  Occupational History  . Occupation: retired  Tobacco Use  . Smoking status: Former Smoker    Last attempt to quit: 06/09/2005    Years since quitting: 12.2  . Smokeless tobacco: Never Used  Substance and Sexual Activity  . Alcohol use: No    Comment: quit 10 years ago  . Drug use: No  . Sexual activity: Yes  Other Topics Concern  . None  Social History Narrative   Patient does not drink caffeine.  Patient is right handed.    Past Surgical History:  Procedure Laterality Date  . TONSILLECTOMY    . VASECTOMY     Past Medical History:  Diagnosis Date  . Acid reflux   . Alcohol abuse    quit 10 y ago  . Arthritis   . Cervical spondylosis without myelopathy 06/02/2015  . Emphysema lung ([place])   . High cholesterol   . Hypertension   . Tremor 06/02/2015   BP (!) 143/83   Pulse 66   SpO2 97%   Opioid Risk Score:   Fall Risk Score:  `1  Depression screen PHQ 2/9  No flowsheet data found.   Review of Systems  Constitutional: Negative.   HENT: Negative.   Eyes: Negative.     Respiratory: Positive for shortness of breath.   Cardiovascular: Negative.   Gastrointestinal: Negative.   Endocrine: Negative.   Genitourinary: Negative.   Musculoskeletal: Negative.   Skin: Negative.   Allergic/Immunologic: Negative.   Neurological: Negative.   Hematological: Negative.   Psychiatric/Behavioral: Negative.   All other systems reviewed and are negative.      Objective:   Physical Exam  Constitutional: He is oriented to person, place, and time. He appears well-developed and well-nourished. No distress.  HENT:  Head: Normocephalic and atraumatic.  Eyes: Conjunctivae and EOM are normal. Pupils are equal, round, and reactive to light.  Neck: Neck supple. No JVD present.  Cardiovascular: Normal rate, regular rhythm and normal heart sounds. Exam reveals no friction rub.  No murmur heard. Pulmonary/Chest: Effort normal and breath sounds normal. No stridor. No respiratory distress. He has no wheezes.  Abdominal: Soft. Bowel sounds are normal. He exhibits no distension. There is no tenderness.  Musculoskeletal:  Shoulders with negative impingement test Has mildly diminished right internal rotation of the glenohumeral joint. Lumbar spine has no tenderness palpation there is no tenderness palpation along the thoracic spine or along the hip area Lumbar range of motion has normal lection extension lateral bending and rotation Hip range of motion is normal with internal/external right rotation bilaterally  Neurological: He is alert and oriented to person, place, and time.  Reflex Scores:      Tricep reflexes are 2+ on the right side and 2+ on the left side.      Bicep reflexes are 2+ on the right side and 2+ on the left side.      Brachioradialis reflexes are 2+ on the right side and 2+ on the left side.      Patellar reflexes are 2+ on the right side and 2+ on the left side.      Achilles reflexes are 0 on the right side and 0 on the left side. Skin: Skin is warm and dry.  He is not diaphoretic.  Psychiatric: He has a normal mood and affect. His behavior is normal. Thought content normal.  Nursing note and vitals reviewed.  Cervical ROM reduced rotation 50% Flex 100% , ext 50%  Motor strength is 5/5 bilateral deltoid, bicep, tricep, grip, hip flexion, knee extension, ankle dorsiflexion and plantarflexion Sensation intact bilateral C5 C6-C7-C8 L2-L3-L4 L5-S1 dermatomal distribution Negative straight leg raising Negative Spurling's test Ambulates without assistive device no evidence of toe drag or knee instability.       Assessment & Plan:  1.  Cervical stenosis and spondylosis No evidence of cervical radiculopathy or myelopathy.  His pain is not interfering with activity.  At this point I do not recommend any change in his medication regimen.  I do not think he needs any further physical therapy at this time. If he has an exacerbation of the cervical pain he may benefit from cervical medial branch blocks under fluoroscopic guidance.  He will call to schedule if needed.  2.  Lumbar stenosis and spondylolisthesis, L4-5 No evidence of lumbar radiculopathy or myelopathy.  His pain does not interfere with activity.  Do not recommend any change in medications.  I do not think he needs any further physical therapy at this time. I do not recommend any interventional pain procedure at this time.  Pt will call if he has an exacerbation of cervical or lumbar pain  Charlett Blake M.D. [place] FAAPM&R (Sports Med, Neuromuscular Med) Fellow American Board of Interventional Pain Physicians Diplomate Am Board of Electrodiagnostic Med

## 2017-10-07 DIAGNOSIS — D509 Iron deficiency anemia, unspecified: Secondary | ICD-10-CM | POA: Diagnosis not present

## 2017-10-07 DIAGNOSIS — Z7982 Long term (current) use of aspirin: Secondary | ICD-10-CM | POA: Diagnosis not present

## 2017-10-07 DIAGNOSIS — R350 Frequency of micturition: Secondary | ICD-10-CM | POA: Diagnosis not present

## 2017-10-07 DIAGNOSIS — K219 Gastro-esophageal reflux disease without esophagitis: Secondary | ICD-10-CM | POA: Diagnosis not present

## 2017-10-07 DIAGNOSIS — Z7951 Long term (current) use of inhaled steroids: Secondary | ICD-10-CM | POA: Diagnosis not present

## 2017-10-07 DIAGNOSIS — R69 Illness, unspecified: Secondary | ICD-10-CM | POA: Diagnosis not present

## 2017-10-07 DIAGNOSIS — N529 Male erectile dysfunction, unspecified: Secondary | ICD-10-CM | POA: Diagnosis not present

## 2017-10-07 DIAGNOSIS — G8929 Other chronic pain: Secondary | ICD-10-CM | POA: Diagnosis not present

## 2017-10-07 DIAGNOSIS — J449 Chronic obstructive pulmonary disease, unspecified: Secondary | ICD-10-CM | POA: Diagnosis not present

## 2017-10-07 DIAGNOSIS — Z809 Family history of malignant neoplasm, unspecified: Secondary | ICD-10-CM | POA: Diagnosis not present

## 2018-04-25 DIAGNOSIS — H52222 Regular astigmatism, left eye: Secondary | ICD-10-CM | POA: Diagnosis not present

## 2018-09-26 ENCOUNTER — Telehealth: Payer: Self-pay

## 2018-09-26 NOTE — Telephone Encounter (Signed)
Copied from [place] [id number]. Topic: Appointment Scheduling - Scheduling Inquiry for Clinic >> Sep 26, 2018  1:58 PM Margot Ables wrote: Reason for CRM: pt called stating he needs to schedule AWV and that Dr. Alain Marion is his PCP. Pt hasn't seen Dr. Alain Marion since 06/09/2014. Pt states [facility] is his civilian doctor and he sees the [place] for other things. I advised him he would need to re-est care. Pt asking to continue with Dr. Alain Marion. Pt states he was never told he had to come in at least every 3 years. Please advise.

## 2018-10-01 NOTE — Telephone Encounter (Signed)
Ok Thx 

## 2019-05-13 DIAGNOSIS — H18419 Arcus senilis, unspecified eye: Secondary | ICD-10-CM | POA: Diagnosis not present

## 2019-05-13 DIAGNOSIS — H524 Presbyopia: Secondary | ICD-10-CM | POA: Diagnosis not present

## 2019-05-19 ENCOUNTER — Telehealth: Payer: Self-pay | Admitting: General Practice

## 2019-05-19 NOTE — Telephone Encounter (Signed)
Copied from [place] [id number]. Topic: General - Inquiry >> May 19, 2019 11:09 AM Mathis Bud wrote: Reason for CRM: patient was a patient of [name].  Patient hasnt seen pcp since 2016, and would like to know if he would still take him on as a patient. He is requesting a call back to confirm. Call back [phone]  Dr.Plot please advise, Thank you.

## 2019-05-24 NOTE — Telephone Encounter (Signed)
Okay.  Thanks.

## 2019-05-25 NOTE — Telephone Encounter (Signed)
Spoke with patient and informed him, Dr.Plot will take him as a patient again.   Patient will call back later to set up an appointment.

## 2019-05-25 NOTE — Telephone Encounter (Signed)
Appointment has been made for 06/04/19.

## 2019-05-27 DIAGNOSIS — R0789 Other chest pain: Secondary | ICD-10-CM | POA: Diagnosis not present

## 2019-05-27 DIAGNOSIS — K219 Gastro-esophageal reflux disease without esophagitis: Secondary | ICD-10-CM | POA: Diagnosis not present

## 2019-06-04 ENCOUNTER — Ambulatory Visit (INDEPENDENT_AMBULATORY_CARE_PROVIDER_SITE_OTHER): Payer: Medicare Other | Admitting: Internal Medicine

## 2019-06-04 ENCOUNTER — Encounter: Payer: Self-pay | Admitting: Internal Medicine

## 2019-06-04 ENCOUNTER — Other Ambulatory Visit: Payer: Self-pay

## 2019-06-04 VITALS — BP 126/74 | HR 89 | Temp 98.6°F | Ht 71.0 in | Wt 167.0 lb

## 2019-06-04 DIAGNOSIS — M4802 Spinal stenosis, cervical region: Secondary | ICD-10-CM

## 2019-06-04 DIAGNOSIS — N401 Enlarged prostate with lower urinary tract symptoms: Secondary | ICD-10-CM | POA: Diagnosis not present

## 2019-06-04 DIAGNOSIS — H8103 Meniere's disease, bilateral: Secondary | ICD-10-CM | POA: Diagnosis not present

## 2019-06-04 DIAGNOSIS — R0989 Other specified symptoms and signs involving the circulatory and respiratory systems: Secondary | ICD-10-CM

## 2019-06-04 DIAGNOSIS — I480 Paroxysmal atrial fibrillation: Secondary | ICD-10-CM

## 2019-06-04 DIAGNOSIS — R350 Frequency of micturition: Secondary | ICD-10-CM | POA: Diagnosis not present

## 2019-06-04 DIAGNOSIS — I10 Essential (primary) hypertension: Secondary | ICD-10-CM

## 2019-06-04 DIAGNOSIS — F1021 Alcohol dependence, in remission: Secondary | ICD-10-CM

## 2019-06-04 DIAGNOSIS — G8929 Other chronic pain: Secondary | ICD-10-CM

## 2019-06-04 DIAGNOSIS — J439 Emphysema, unspecified: Secondary | ICD-10-CM

## 2019-06-04 DIAGNOSIS — Z0000 Encounter for general adult medical examination without abnormal findings: Secondary | ICD-10-CM | POA: Diagnosis not present

## 2019-06-04 DIAGNOSIS — N400 Enlarged prostate without lower urinary tract symptoms: Secondary | ICD-10-CM | POA: Insufficient documentation

## 2019-06-04 NOTE — Assessment & Plan Note (Signed)
PAF - no relapse

## 2019-06-04 NOTE — Assessment & Plan Note (Signed)
Chronic OA Vit D

## 2019-06-04 NOTE — Assessment & Plan Note (Signed)
NAS diet Off meds

## 2019-06-04 NOTE — Assessment & Plan Note (Signed)
Not drinking since 2005

## 2019-06-04 NOTE — Progress Notes (Signed)
Subjective:  Patient ID: Lee [name], male    DOB: Feb 24, 1946  Age: 73 y.o. MRN: [id number]  CC: No chief complaint on file.   HPI Lee [name] presents for a new pt - well visit to re-establish. C/o OA - worse, On a pill for OA??? per pt. Menier's disease with variable sx's, COPD f/u Seeing doctors at [facility]  Outpatient Medications Prior to Visit  Medication Sig Dispense Refill   albuterol (PROVENTIL HFA;VENTOLIN HFA) 108 (90 Base) MCG/ACT inhaler Inhale 2 puffs into the lungs every 4 (four) hours as needed for wheezing or shortness of breath. 1 Inhaler 0   budesonide-formoterol (SYMBICORT) 80-4.5 MCG/ACT inhaler Inhale 2 puffs into the lungs 2 (two) times daily.     Ergocalciferol (VITAMIN D2) 50 MCG (2000 UT) TABS Take by mouth.     folic acid (FOLVITE) 1 MG tablet Take 1 mg by mouth daily.     Multiple Vitamin (QUINTABS) TABS Take by mouth.     oxybutynin (DITROPAN-XL) 10 MG 24 hr tablet Take 10 mg by mouth at bedtime.     pantoprazole (PROTONIX) 40 MG tablet Take by mouth.     tamsulosin (FLOMAX) 0.4 MG CAPS capsule Take 0.4 mg by mouth.     Tiotropium Bromide Monohydrate (SPIRIVA RESPIMAT) 2.5 MCG/ACT AERS Inhale 1 puff into the lungs daily.     Turmeric POWD Take 5 mLs by mouth daily.     aspirin EC 81 MG tablet Take 1 tablet (81 mg total) by mouth daily. 100 tablet 3   ibuprofen (ADVIL,MOTRIN) 200 MG tablet Take 200-400 mg by mouth every 6 (six) hours as needed for fever, headache, mild pain or moderate pain.     ranitidine (ZANTAC) 150 MG tablet Take 150 mg by mouth 2 (two) times daily as needed for heartburn.     tadalafil (CIALIS) 10 MG tablet Take 10 mg by mouth daily as needed for erectile dysfunction.     No facility-administered medications prior to visit.     ROS: Review of Systems  Constitutional: Negative for appetite change, fatigue and unexpected weight change.  HENT: Positive for tinnitus. Negative for congestion, nosebleeds, sneezing,  sore throat and trouble swallowing.   Eyes: Negative for itching and visual disturbance.  Respiratory: Negative for cough.   Cardiovascular: Negative for chest pain, palpitations and leg swelling.  Gastrointestinal: Negative for abdominal distention, blood in stool, diarrhea and nausea.  Genitourinary: Negative for frequency and hematuria.  Musculoskeletal: Positive for arthralgias. Negative for back pain, gait problem, joint swelling and neck pain.  Skin: Negative for rash.  Neurological: Positive for dizziness. Negative for tremors, speech difficulty, weakness and headaches.  Psychiatric/Behavioral: Negative for agitation, dysphoric mood, sleep disturbance and suicidal ideas. The patient is not nervous/anxious.     Objective:  BP 126/74 (BP Location: Left Arm, Patient Position: Sitting, Cuff Size: Normal)    Pulse 89    Temp 98.6 F (37 C) (Oral)    Ht 5\' 11"  (1.803 m)    Wt 167 lb (75.8 kg)    SpO2 95%    BMI 23.29 kg/m   BP Readings from Last 3 Encounters:  06/04/19 126/74  08/27/17 (!) 143/83  01/05/17 127/82    Wt Readings from Last 3 Encounters:  06/04/19 167 lb (75.8 kg)  09/20/16 175 lb (79.4 kg)  06/02/15 180 lb (81.6 kg)    Physical Exam Constitutional:      General: He is not in acute distress.    Appearance: He is well-developed.  Comments: NAD  Eyes:     Conjunctiva/sclera: Conjunctivae normal.     Pupils: Pupils are equal, round, and reactive to light.  Neck:     Musculoskeletal: Normal range of motion.     Thyroid: No thyromegaly.     Vascular: No JVD.  Cardiovascular:     Rate and Rhythm: Normal rate and regular rhythm.     Heart sounds: Normal heart sounds. No murmur. No friction rub. No gallop.   Pulmonary:     Effort: Pulmonary effort is normal. No respiratory distress.     Breath sounds: Normal breath sounds. No wheezing or rales.  Chest:     Chest wall: No tenderness.  Abdominal:     General: Bowel sounds are normal. There is no distension.      Palpations: Abdomen is soft. There is no mass.     Tenderness: There is no abdominal tenderness. There is no guarding or rebound.  Musculoskeletal: Normal range of motion.        General: No tenderness.  Lymphadenopathy:     Cervical: No cervical adenopathy.  Skin:    General: Skin is warm and dry.     Findings: No rash.  Neurological:     Mental Status: He is alert and oriented to person, place, and time.     Cranial Nerves: No cranial nerve deficit.     Motor: No abnormal muscle tone.     Coordination: Coordination normal.     Gait: Gait normal.     Deep Tendon Reflexes: Reflexes are normal and symmetric.  Psychiatric:        Behavior: Behavior normal.        Thought Content: Thought content normal.        Judgment: Judgment normal.   Rectal exam -- per [facility] Mild bruit B Ataxic Pursing lips  Lab Results  Component Value Date   WBC 6.5 01/05/2017   HGB 13.9 01/05/2017   HCT 40.5 01/05/2017   PLT 199 01/05/2017   GLUCOSE 106 (H) 01/05/2017   NA 143 01/05/2017   K 3.9 01/05/2017   CL 108 01/05/2017   CREATININE 0.98 01/05/2017   BUN 11 01/05/2017   CO2 27 01/05/2017    Dg Chest Port 1 View  Result Date: 01/05/2017 CLINICAL DATA:  Shortness of breath for 5 hours.  Weakness. EXAM: PORTABLE CHEST 1 VIEW COMPARISON:  09/20/2016 FINDINGS: Stable hyperinflation. The cardiomediastinal contours are normal. The lungs are clear. Pulmonary vasculature is normal. No consolidation, pleural effusion, or pneumothorax. No acute osseous abnormalities are seen. IMPRESSION: Stable hyperinflation suggesting emphysema/COPD. No acute abnormality. Electronically Signed   By: Jeb Levering M.D.   On: 01/05/2017 03:29    Assessment & Plan:   There are no diagnoses linked to this encounter.   No orders of the defined types were placed in this encounter.    Follow-up: No follow-ups on file.  Walker Kehr, MD

## 2019-06-04 NOTE — Patient Instructions (Addendum)
If you have medicare related insurance (such as traditional Medicare, [employer], [employer], or similar), Please make an appointment at the scheduling desk with Sharee Pimple, the [employer], for your Wellness visit in this office, which is a benefit with your insurance.   Cardiac CT calcium scoring test $150 Tel # is [phone]   Computed tomography, more commonly known as a CT or CAT scan, is a diagnostic medical imaging test. Like traditional x-rays, it produces multiple images or pictures of the inside of the body. The cross-sectional images generated during a CT scan can be reformatted in multiple planes. They can even generate three-dimensional images. These images can be viewed on a computer monitor, printed on film or by a 3D printer, or transferred to a CD or DVD. CT images of internal organs, bones, soft tissue and blood vessels provide greater detail than traditional x-rays, particularly of soft tissues and blood vessels. A cardiac CT scan for coronary calcium is a non-invasive way of obtaining information about the presence, location and extent of calcified plaque in the coronary arteries-the vessels that supply oxygen-containing blood to the heart muscle. Calcified plaque results when there is a build-up of fat and other substances under the inner layer of the artery. This material can calcify which signals the presence of atherosclerosis, a disease of the vessel wall, also called coronary artery disease (CAD). People with this disease have an increased risk for heart attacks. In addition, over time, progression of plaque build up (CAD) can narrow the arteries or even close off blood flow to the heart. The result may be chest pain, sometimes called "angina," or a heart attack. Because calcium is a marker of CAD, the amount of calcium detected on a cardiac CT scan is a helpful prognostic tool. The findings on cardiac CT are expressed as a calcium score. Another  name for this test is coronary artery calcium scoring.  What are some common uses of the procedure? The goal of cardiac CT scan for calcium scoring is to determine if CAD is present and to what extent, even if there are no symptoms. It is a screening study that may be recommended by a physician for patients with risk factors for CAD but no clinical symptoms. The major risk factors for CAD are: . high blood cholesterol levels  . family history of heart attacks  . diabetes  . high blood pressure  . cigarette smoking  . overweight or obese  . physical inactivity   A negative cardiac CT scan for calcium scoring shows no calcification within the coronary arteries. This suggests that CAD is absent or so minimal it cannot be seen by this technique. The chance of having a heart attack over the next two to five years is very low under these circumstances. A positive test means that CAD is present, regardless of whether or not the patient is experiencing any symptoms. The amount of calcification-expressed as the calcium score-may help to predict the likelihood of a myocardial infarction (heart attack) in the coming years and helps your medical doctor or cardiologist decide whether the patient may need to take preventive medicine or undertake other measures such as diet and exercise to lower the risk for heart attack. The extent of CAD is graded according to your calcium score:  Calcium Score  Presence of CAD (coronary artery disease)  0 No evidence of CAD   1-10 Minimal evidence of CAD  11-100 Mild evidence of CAD  101-400 Moderate evidence of  CAD  Over 400 Extensive evidence of CAD

## 2019-06-04 NOTE — Assessment & Plan Note (Signed)
Per [facility] Balance exercises

## 2019-06-04 NOTE — Assessment & Plan Note (Signed)
Carotid doppler US

## 2019-06-04 NOTE — Assessment & Plan Note (Signed)
Seeing a NS at the [place]

## 2019-06-04 NOTE — Assessment & Plan Note (Signed)
Chronic Per [place]

## 2019-06-04 NOTE — Assessment & Plan Note (Addendum)
Tamsulosin Ditropan

## 2019-06-04 NOTE — Assessment & Plan Note (Addendum)
We discussed age appropriate health related issues, including available/recomended screening tests and vaccinations. We discussed a need for adhering to healthy diet and exercise. Labs - pt gets them done at the [place]. He had a CXR. All questions were answered. Cologuard via [place] in 2020 recently - no results yet Coronary calcium CT offered 2020 Shots are up to date

## 2019-06-10 NOTE — Addendum Note (Signed)
Addended by: Karren Cobble on: 06/10/2019 04:46 PM   Modules accepted: Orders

## 2019-06-16 ENCOUNTER — Ambulatory Visit (HOSPITAL_COMMUNITY)
Admission: RE | Admit: 2019-06-16 | Discharge: 2019-06-16 | Disposition: A | Discharge status: Home / Self Care | Payer: No Typology Code available for payment source | Source: Ambulatory Visit | Attending: Cardiovascular Disease | Admitting: Cardiovascular Disease

## 2019-06-16 ENCOUNTER — Other Ambulatory Visit: Payer: Self-pay

## 2019-06-16 DIAGNOSIS — R0989 Other specified symptoms and signs involving the circulatory and respiratory systems: Secondary | ICD-10-CM | POA: Diagnosis not present

## 2019-09-08 ENCOUNTER — Telehealth (HOSPITAL_COMMUNITY): Payer: Self-pay | Admitting: Internal Medicine

## 2019-09-09 ENCOUNTER — Encounter (HOSPITAL_COMMUNITY): Payer: Self-pay | Admitting: *Deleted

## 2019-09-09 NOTE — Progress Notes (Signed)
Received [facility] Authorization - [id number] from community care [place] on behalf of Dr. Gwenette Greet for the diagnosis of COPD.  Spoke with pt on yesterday, after pt received his letter from community care.  Unfortunately at that time we had not received his authorization.  Called and requested the authorization be sent to [place].  Reviewed accompanying information did not see any pulmonary clinic notes.  Called and left message for the community care coordinator requesting pulmonary clinic notes.  Reviewed pt progress note from November when he was seen by his PCP - Dr. Alain Marion.  Pt also seen by neurosurgeon at [facility] for his cervical stenosis.  Once his clinic notes are received from the [place] will be able to move forward with scheduling his pulmonary rehab.  Pt who is an avid walker is looking forward to increasing his strength and endurance. Cherre Huger, BSN Cardiac and Training and development officer

## 2019-09-10 ENCOUNTER — Telehealth (HOSPITAL_COMMUNITY): Payer: Self-pay

## 2019-09-10 NOTE — Telephone Encounter (Signed)
Pulmonary Rehab Note:  Successful telephone encounter to [name] to discuss recent referral from [place] for pulmonary rehab at no cost to him. Patient states he is very interested in attending and request to start ASAP. Virtual pulmonary rehab app is offered however patient states he is not interested at this time. Patient informed he will be contacted next week to schedule pulmonary orientation appointment. Patient verbalized understanding.  Loistine Eberlin E. Rollene Rotunda RN, BSN . [facility]  Cardiac and Pulmonary Rehabilitation [place] Direct: [phone]

## 2019-09-15 ENCOUNTER — Telehealth (HOSPITAL_COMMUNITY): Payer: Self-pay

## 2019-09-15 ENCOUNTER — Telehealth (HOSPITAL_COMMUNITY): Payer: Self-pay | Admitting: Internal Medicine

## 2019-09-21 ENCOUNTER — Other Ambulatory Visit: Payer: Self-pay

## 2019-09-21 ENCOUNTER — Encounter (HOSPITAL_COMMUNITY)
Admission: RE | Admit: 2019-09-21 | Discharge: 2019-09-21 | Disposition: A | Discharge status: Home / Self Care | Payer: No Typology Code available for payment source | Source: Ambulatory Visit | Attending: Pulmonary Disease | Admitting: Pulmonary Disease

## 2019-09-21 VITALS — BP 100/64 | HR 107 | Ht 71.0 in | Wt 164.0 lb

## 2019-09-21 DIAGNOSIS — J449 Chronic obstructive pulmonary disease, unspecified: Secondary | ICD-10-CM | POA: Insufficient documentation

## 2019-09-21 NOTE — Progress Notes (Signed)
Lee [name] 74 y.o. male Pulmonary Rehab Orientation Note Patient arrived today in Cardiac and Pulmonary Rehab for orientation to Pulmonary Rehab. He was walked from [employer] with moderate shortness of breath.  He does not carry portable oxygen. Per pt, he uses oxygen never. Color good, skin warm and dry. Patient is oriented to time and place. Patient's medical history, psychosocial health, and medications reviewed. Psychosocial assessment reveals pt lives alone. Pt is currently retired after 55 years in the [employer]. Pt reports his stress level is low. Pt does not exhibit  signs of depression. PHQ2/9 score 0/0. Pt shows good  coping skills with positive outlook . Will continue to monitor and evaluate progress toward psychosocial goal(s) of no psychosocial concerns or barriers in participation in pulmonary rehab. Physical assessment reveals heart rate is normal, breath sounds clear to auscultation, no wheezes, rales, or rhonchi. Grip strength equal, strong. Patient reports he does take medications as prescribed. Patient states he follows a Regular diet. The patient reports no specific efforts to gain or lose weight.. Patient's weight will be monitored closely. Demonstration and practice of PLB using pulse oximeter. Patient able to return demonstration satisfactorily. Safety and hand hygiene in the exercise area reviewed with patient. Patient voices understanding of the information reviewed. Department expectations discussed with patient and achievable goals were set. The patient shows enthusiasm about attending the program and we look forward to working with this nice gentleman. The patient completed  a 6 min walk test today  and to begin exercise on 09/29/2019 in the 1015 class.  [place]

## 2019-09-22 NOTE — Progress Notes (Signed)
Pulmonary Individual Treatment Plan  Patient Details  Name: Lee [name] MRN: [id number] Date of Birth: 07-26-1946 Referring Provider:     Pulmonary Rehab Walk Test from 09/21/2019 in [place]  Referring Provider  Dr. Radford Pax      Initial Encounter Date:    Pulmonary Rehab Walk Test from 09/21/2019 in [place]  Date  09/21/19      Visit Diagnosis: Chronic obstructive pulmonary disease, unspecified COPD type ([place])  Patient's Home Medications on Admission:   Current Outpatient Medications:  .  albuterol (PROVENTIL HFA;VENTOLIN HFA) 108 (90 Base) MCG/ACT inhaler, Inhale 2 puffs into the lungs every 4 (four) hours as needed for wheezing or shortness of breath., Disp: 1 Inhaler, Rfl: 0 .  budesonide-formoterol (SYMBICORT) 80-4.5 MCG/ACT inhaler, Inhale 2 puffs into the lungs 2 (two) times daily., Disp: , Rfl:  .  Ergocalciferol (VITAMIN D2) 50 MCG (2000 UT) TABS, Take by mouth., Disp: , Rfl:  .  folic acid (FOLVITE) 1 MG tablet, Take 1 mg by mouth daily., Disp: , Rfl:  .  Multiple Vitamin (QUINTABS) TABS, Take by mouth., Disp: , Rfl:  .  oxybutynin (DITROPAN-XL) 10 MG 24 hr tablet, Take 10 mg by mouth at bedtime., Disp: , Rfl:  .  pantoprazole (PROTONIX) 40 MG tablet, Take by mouth., Disp: , Rfl:  .  Tiotropium Bromide Monohydrate (SPIRIVA RESPIMAT) 2.5 MCG/ACT AERS, Inhale 1 puff into the lungs daily., Disp: , Rfl:  .  Turmeric POWD, Take 5 mLs by mouth daily., Disp: , Rfl:  .  tamsulosin (FLOMAX) 0.4 MG CAPS capsule, Take 0.4 mg by mouth., Disp: , Rfl:   Past Medical History: Past Medical History:  Diagnosis Date  . Acid reflux   . Alcohol abuse    quit 10 y ago  . Arthritis   . Cervical spondylosis without myelopathy 06/02/2015  . Emphysema lung ([place])   . High cholesterol   . Hypertension   . Tremor 06/02/2015    Tobacco Use: Social History   Tobacco Use  Smoking Status Former Smoker  . Quit date:  06/09/2005  . Years since quitting: 14.2  Smokeless Tobacco Never Used    Labs: Recent Review [facility] for [facility] Latest Ref Rng & Units 01/05/2017   PHART 7.350 - 7.450 7.403   PCO2ART 32.0 - 48.0 mmHg 39.7   HCO3 20.0 - 28.0 mmol/L 24.4   O2SAT % 92.6      Capillary Blood Glucose: No results found for: GLUCAP   Pulmonary Assessment Scores: Pulmonary Assessment Scores    Row Name 09/21/19 1126 09/21/19 1129       ADL UCSD   ADL Phase  Entry  Entry    SOB Score total  75  --      CAT Score   CAT Score  23  --      mMRC Score   mMRC Score  --  3      UCSD: Self-administered rating of dyspnea associated with activities of daily living (ADLs) 6-point scale (0 = "not at all" to 5 = "maximal or unable to do because of breathlessness")  Scoring Scores range from 0 to 120.  Minimally important difference is 5 units  CAT: CAT can identify the health impairment of COPD patients and is better correlated with disease progression.  CAT has a scoring range of zero to 40. The CAT score is classified into four groups of  low (less than 10), medium (10 - 20), high (21-30) and very high (31-40) based on the impact level of disease on health status. A CAT score over 10 suggests significant symptoms.  A worsening CAT score could be explained by an exacerbation, poor medication adherence, poor inhaler technique, or progression of COPD or comorbid conditions.  CAT MCID is 2 points  mMRC: mMRC (Modified Medical Research Council) Dyspnea Scale is used to assess the degree of baseline functional disability in patients of respiratory disease due to dyspnea. No minimal important difference is established. A decrease in score of 1 point or greater is considered a positive change.   Pulmonary Function Assessment: Pulmonary Function Assessment - 09/21/19 0942      Breath   Bilateral Breath Sounds  Clear    Shortness of Breath  Yes;Limiting activity        Exercise Target Goals: Exercise Program Goal: Individual exercise prescription set using results from initial 6 min walk test and THRR while considering  patient's activity barriers and safety.   Exercise Prescription Goal: Initial exercise prescription builds to 30-45 minutes a day of aerobic activity, 2-3 days per week.  Home exercise guidelines will be given to patient during program as part of exercise prescription that the participant will acknowledge.  Activity Barriers & Risk Stratification: Activity Barriers & Cardiac Risk Stratification - 09/21/19 0934      Activity Barriers & Cardiac Risk Stratification   Activity Barriers  Arthritis;Shortness of Breath;Balance Concerns;History of Falls       6 Minute Walk: 6 Minute Walk    Row Name 09/21/19 1129         6 Minute Walk   Phase  Initial     Distance  1367 feet     Walk Time  6 minutes     # of Rest Breaks  0     MPH  2.59     METS  3.21     RPE  12     Perceived Dyspnea   2     VO2 Peak  11.23     Symptoms  No     Resting HR  81 bpm     Resting BP  106/64     Resting Oxygen Saturation   98 %     Exercise Oxygen Saturation  during 6 min walk  90 %     Max Ex. HR  111 bpm     Max Ex. BP  110/64     2 Minute Post BP  106/68       Interval HR   1 Minute HR  107     2 Minute HR  107     3 Minute HR  108     4 Minute HR  111     5 Minute HR  107     6 Minute HR  107     2 Minute Post HR  90     Interval Heart Rate?  Yes       Interval Oxygen   Interval Oxygen?  Yes     Baseline Oxygen Saturation %  98 %     1 Minute Oxygen Saturation %  97 %     1 Minute Liters of Oxygen  0 L     2 Minute Oxygen Saturation %  91 %     2 Minute Liters of Oxygen  0 L     3 Minute Oxygen Saturation %  93 %  3 Minute Liters of Oxygen  0 L     4 Minute Oxygen Saturation %  92 %     4 Minute Liters of Oxygen  0 L     5 Minute Oxygen Saturation %  95 %     5 Minute Liters of Oxygen  0 L     6 Minute Oxygen Saturation  %  90 %     6 Minute Liters of Oxygen  0 L     2 Minute Post Oxygen Saturation %  97 %     2 Minute Post Liters of Oxygen  0 L        Oxygen Initial Assessment: Oxygen Initial Assessment - 09/21/19 1128      Home Oxygen   Home Oxygen Device  None    Sleep Oxygen Prescription  None    Home Exercise Oxygen Prescription  None    Home at Rest Exercise Oxygen Prescription  None    Compliance with Home Oxygen Use  Yes      Initial 6 min Walk   Oxygen Used  None      Program Oxygen Prescription   Program Oxygen Prescription  None      Intervention   Short Term Goals  To learn and exhibit compliance with exercise, home and travel O2 prescription;To learn and understand importance of monitoring SPO2 with pulse oximeter and demonstrate accurate use of the pulse oximeter.;To learn and understand importance of maintaining oxygen saturations>88%;To learn and demonstrate proper pursed lip breathing techniques or other breathing techniques.;To learn and demonstrate proper use of respiratory medications    Long  Term Goals  Demonstrates proper use of MDI's;Compliance with respiratory medication;Exhibits proper breathing techniques, such as pursed lip breathing or other method taught during program session;Maintenance of O2 saturations>88%;Verbalizes importance of monitoring SPO2 with pulse oximeter and return demonstration;Exhibits compliance with exercise, home and travel O2 prescription       Oxygen Re-Evaluation:   Oxygen Discharge (Final Oxygen Re-Evaluation):   Initial Exercise Prescription: Initial Exercise Prescription - 09/21/19 1100      Date of Initial Exercise RX and Referring Provider   Date  09/21/19    Referring Provider  Dr. Radford Pax      Recumbant Bike   Level  2    Minutes  15      NuStep   Level  3    SPM  80    Minutes  15      Prescription Details   Frequency (times per week)  2    Duration  Progress to 30 minutes of continuous aerobic without signs/symptoms  of physical distress      Intensity   THRR 40-80% of Max Heartrate  59-118    Ratings of Perceived Exertion  11-13    Perceived Dyspnea  0-4      Progression   Progression  Continue to progress workloads to maintain intensity without signs/symptoms of physical distress.      Resistance Training   Training Prescription  Yes    Weight  blue bands    Reps  10-15       Perform Capillary Blood Glucose checks as needed.  Exercise Prescription Changes:   Exercise Comments:   Exercise Goals and Review: Exercise Goals    Row Name 09/21/19 1139             Exercise Goals   Increase Physical Activity  Yes       Intervention  Provide advice, education,  support and counseling about physical activity/exercise needs.;Develop an individualized exercise prescription for aerobic and resistive training based on initial evaluation findings, risk stratification, comorbidities and participant's personal goals.       Expected Outcomes  Short Term: Attend rehab on a regular basis to increase amount of physical activity.;Long Term: Add in home exercise to make exercise part of routine and to increase amount of physical activity.;Long Term: Exercising regularly at least 3-5 days a week.       Increase Strength and Stamina  Yes       Intervention  Provide advice, education, support and counseling about physical activity/exercise needs.;Develop an individualized exercise prescription for aerobic and resistive training based on initial evaluation findings, risk stratification, comorbidities and participant's personal goals.       Expected Outcomes  Short Term: Increase workloads from initial exercise prescription for resistance, speed, and METs.;Short Term: Perform resistance training exercises routinely during rehab and add in resistance training at home;Long Term: Improve cardiorespiratory fitness, muscular endurance and strength as measured by increased METs and functional capacity (6MWT)       Able to  understand and use rate of perceived exertion (RPE) scale  Yes       Intervention  Provide education and explanation on how to use RPE scale       Expected Outcomes  Short Term: Able to use RPE daily in rehab to express subjective intensity level;Long Term:  Able to use RPE to guide intensity level when exercising independently       Able to understand and use Dyspnea scale  Yes       Intervention  Provide education and explanation on how to use Dyspnea scale       Expected Outcomes  Short Term: Able to use Dyspnea scale daily in rehab to express subjective sense of shortness of breath during exertion;Long Term: Able to use Dyspnea scale to guide intensity level when exercising independently       Knowledge and understanding of Target Heart Rate Range (THRR)  Yes       Expected Outcomes  Short Term: Able to state/look up THRR;Short Term: Able to use daily as guideline for intensity in rehab;Long Term: Able to use THRR to govern intensity when exercising independently       Understanding of Exercise Prescription  Yes       Intervention  Provide education, explanation, and written materials on patient's individual exercise prescription       Expected Outcomes  Short Term: Able to explain program exercise prescription;Long Term: Able to explain home exercise prescription to exercise independently          Exercise Goals Re-Evaluation :   Discharge Exercise Prescription (Final Exercise Prescription Changes):   Nutrition:  Target Goals: Understanding of nutrition guidelines, daily intake of sodium 1500mg , cholesterol 200mg , calories 30% from fat and 7% or less from saturated fats, daily to have 5 or more servings of fruits and vegetables.  Biometrics: Pre Biometrics - 09/21/19 0940      Pre Biometrics   Height  5\' 11"  (1.803 m)    Weight  74.4 kg    BMI (Calculated)  22.89    Grip Strength  34 kg        Nutrition Therapy Plan and Nutrition Goals:   Nutrition  Assessments:   Nutrition Goals Re-Evaluation:   Nutrition Goals Discharge (Final Nutrition Goals Re-Evaluation):   Psychosocial: Target Goals: Acknowledge presence or absence of significant depression and/or stress, maximize coping skills, provide  positive support system. Participant is able to verbalize types and ability to use techniques and skills needed for reducing stress and depression.  Initial Review & Psychosocial Screening: Initial Psych Review & Screening - 09/21/19 0943      Initial Review   Current issues with  None Identified      Family Dynamics   Good Support System?  Yes      Barriers   Psychosocial barriers to participate in program  There are no identifiable barriers or psychosocial needs.      Screening Interventions   Interventions  Encouraged to exercise       Quality of Life Scores:  Scores of 19 and below usually indicate a poorer quality of life in these areas.  A difference of  2-3 points is a clinically meaningful difference.  A difference of 2-3 points in the total score of the Quality of Life Index has been associated with significant improvement in overall quality of life, self-image, physical symptoms, and general health in studies assessing change in quality of life.  PHQ-9: Recent Review Flowsheet Data    Depression screen [facility] 2/9 09/21/2019 09/21/2019 08/27/2017   Decreased Interest 0 0 0   Down, Depressed, Hopeless 0 0 3   PHQ - 2 Score 0 0 3   Altered sleeping 0 - 1   Tired, decreased energy 0 - 1   Change in appetite 0 - 0   Feeling bad or failure about yourself  0 - 1   Trouble concentrating 0 - 1   Moving slowly or fidgety/restless 0 - 0   Suicidal thoughts 0 - 0   PHQ-9 Score 0 - 7   Difficult doing work/chores Not difficult at all - Somewhat difficult     Interpretation of Total Score  Total Score Depression Severity:  1-4 = Minimal depression, 5-9 = Mild depression, 10-14 = Moderate depression, 15-19 = Moderately severe  depression, 20-27 = Severe depression   Psychosocial Evaluation and Intervention: Psychosocial Evaluation - 09/21/19 0944      Psychosocial Evaluation & Interventions   Interventions  Encouraged to exercise with the program and follow exercise prescription    Comments  No psychosocial barriers or concerns identified    Expected Outcomes  Will continue to have no barriers or concerns    Continue Psychosocial Services   No Follow up required       Psychosocial Re-Evaluation:   Psychosocial Discharge (Final Psychosocial Re-Evaluation):   Education: Education Goals: Education classes will be provided on a weekly basis, covering required topics. Participant will state understanding/return demonstration of topics presented.  Learning Barriers/Preferences: Learning Barriers/Preferences - 09/21/19 0946      Learning Barriers/Preferences   Learning Barriers  None    Learning Preferences  Audio;Computer/Internet;Group Instruction;Pictoral;Skilled Demonstration;Individual Instruction;Verbal Instruction;Video;Written Material       Education Topics: Risk Factor Reduction:  -Group instruction that is supported by a PowerPoint presentation. Instructor discusses the definition of a risk factor, different risk factors for pulmonary disease, and how the heart and lungs work together.     Nutrition for Pulmonary Patient:  -Group instruction provided by PowerPoint slides, verbal discussion, and written materials to support subject matter. The instructor gives an explanation and review of healthy diet recommendations, which includes a discussion on weight management, recommendations for fruit and vegetable consumption, as well as protein, fluid, caffeine, fiber, sodium, sugar, and alcohol. Tips for eating when patients are short of breath are discussed.   Pursed Lip Breathing:  -Group instruction that  is supported by demonstration and informational handouts. Instructor discusses the benefits of  pursed lip and diaphragmatic breathing and detailed demonstration on how to preform both.     Oxygen Safety:  -Group instruction provided by PowerPoint, verbal discussion, and written material to support subject matter. There is an overview of "What is Oxygen" and "Why do we need it".  Instructor also reviews how to create a safe environment for oxygen use, the importance of using oxygen as prescribed, and the risks of noncompliance. There is a brief discussion on traveling with oxygen and resources the patient may utilize.   Oxygen Equipment:  -Group instruction provided by [facility] Staff utilizing handouts, written materials, and equipment demonstrations.   Signs and Symptoms:  -Group instruction provided by written material and verbal discussion to support subject matter. Warning signs and symptoms of infection, stroke, and heart attack are reviewed and when to call the physician/911 reinforced. Tips for preventing the spread of infection discussed.   Advanced Directives:  -Group instruction provided by verbal instruction and written material to support subject matter. Instructor reviews Advanced Directive laws and proper instruction for filling out document.   Pulmonary Video:  -Group video education that reviews the importance of medication and oxygen compliance, exercise, good nutrition, pulmonary hygiene, and pursed lip and diaphragmatic breathing for the pulmonary patient.   Exercise for the Pulmonary Patient:  -Group instruction that is supported by a PowerPoint presentation. Instructor discusses benefits of exercise, core components of exercise, frequency, duration, and intensity of an exercise routine, importance of utilizing pulse oximetry during exercise, safety while exercising, and options of places to exercise outside of rehab.     Pulmonary Medications:  -Verbally interactive group education provided by instructor with focus on inhaled medications and proper  administration.   Anatomy and Physiology of the Respiratory System and Intimacy:  -Group instruction provided by PowerPoint, verbal discussion, and written material to support subject matter. Instructor reviews respiratory cycle and anatomical components of the respiratory system and their functions. Instructor also reviews differences in obstructive and restrictive respiratory diseases with examples of each. Intimacy, Sex, and Sexuality differences are reviewed with a discussion on how relationships can change when diagnosed with pulmonary disease. Common sexual concerns are reviewed.   MD DAY -A group question and answer session with a medical doctor that allows participants to ask questions that relate to their pulmonary disease state.   OTHER EDUCATION -Group or individual verbal, written, or video instructions that support the educational goals of the pulmonary rehab program.   Holiday Eating Survival Tips:  -Group instruction provided by PowerPoint slides, verbal discussion, and written materials to support subject matter. The instructor gives patients tips, tricks, and techniques to help them not only survive but enjoy the holidays despite the onslaught of food that accompanies the holidays.   Knowledge Questionnaire Score: Knowledge Questionnaire Score - 09/21/19 1127      Knowledge Questionnaire Score   Pre Score  8/18       Core Components/Risk Factors/Patient Goals at Admission: Personal Goals and Risk Factors at Admission - 09/21/19 0946      Core Components/Risk Factors/Patient Goals on Admission   Improve shortness of breath with ADL's  Yes    Intervention  Provide education, individualized exercise plan and daily activity instruction to help decrease symptoms of SOB with activities of daily living.    Expected Outcomes  Short Term: Improve cardiorespiratory fitness to achieve a reduction of symptoms when performing ADLs;Long Term: Be able to perform more  ADLs without  symptoms or delay the onset of symptoms       Core Components/Risk Factors/Patient Goals Review:  Goals and Risk Factor Review    Row Name 09/21/19 0946             Core Components/Risk Factors/Patient Goals Review   Personal Goals Review  Develop more efficient breathing techniques such as purse lipped breathing and diaphragmatic breathing and practicing self-pacing with activity.;Increase knowledge of respiratory medications and ability to use respiratory devices properly.;Improve shortness of breath with ADL's          Core Components/Risk Factors/Patient Goals at Discharge (Final Review):  Goals and Risk Factor Review - 09/21/19 0946      Core Components/Risk Factors/Patient Goals Review   Personal Goals Review  Develop more efficient breathing techniques such as purse lipped breathing and diaphragmatic breathing and practicing self-pacing with activity.;Increase knowledge of respiratory medications and ability to use respiratory devices properly.;Improve shortness of breath with ADL's       ITP Comments:   Comments:

## 2019-09-29 ENCOUNTER — Other Ambulatory Visit: Payer: Self-pay

## 2019-09-29 ENCOUNTER — Encounter (HOSPITAL_COMMUNITY)
Admission: RE | Admit: 2019-09-29 | Discharge: 2019-09-29 | Disposition: A | Discharge status: Home / Self Care | Payer: No Typology Code available for payment source | Source: Ambulatory Visit | Attending: Pulmonary Disease | Admitting: Pulmonary Disease

## 2019-09-29 DIAGNOSIS — J449 Chronic obstructive pulmonary disease, unspecified: Secondary | ICD-10-CM | POA: Insufficient documentation

## 2019-09-29 NOTE — Progress Notes (Signed)
Daily Session Note  Patient Details  Name: Lee [name] MRN: [id number] Date of Birth: [birth year] Referring Provider:     Pulmonary Rehab Walk Test from 09/21/2019 in [place]  Referring Provider  Dr. Radford Pax      Encounter Date: 09/29/2019  Check In: Session Check In - 09/29/19 1033      Check-In   Supervising physician immediately available to respond to emergencies  [facility] immediately available    Physician(s)  Dr. Doristine Bosworth    Location  [facility]    Staff Present  Rosebud Poles, RN, Bjorn Loser, MS, Exercise Physiologist;Brittany Durene Fruits, BS, ACSM CEP, Exercise Physiologist    Virtual Visit  No    Medication changes reported      No    Fall or balance concerns reported     No    Tobacco Cessation  No Change    Warm-up and Cool-down  Performed as group-led instruction    Resistance Training Performed  Yes    VAD Patient?  No    PAD/SET Patient?  No      Pain Assessment   Currently in Pain?  No/denies    Multiple Pain Sites  No       Capillary Blood Glucose: No results found for this or any previous visit (from the past 24 hour(s)).    Social History   Tobacco Use  Smoking Status Former Smoker  . Quit date: 06/09/2005  . Years since quitting: 14.3  Smokeless Tobacco Never Used    Goals Met:  Independence with exercise equipment Exercise tolerated well Strength training completed today  Goals Unmet:  Not Applicable  Comments: Service time is from 0945 to 1100    Dr. Fransico Him is Medical Director for Cardiac Rehab at [facility].

## 2019-10-01 ENCOUNTER — Encounter (HOSPITAL_COMMUNITY)
Admission: RE | Admit: 2019-10-01 | Discharge: 2019-10-01 | Disposition: A | Discharge status: Home / Self Care | Payer: No Typology Code available for payment source | Source: Ambulatory Visit | Attending: Pulmonary Disease | Admitting: Pulmonary Disease

## 2019-10-01 ENCOUNTER — Other Ambulatory Visit: Payer: Self-pay

## 2019-10-01 DIAGNOSIS — J449 Chronic obstructive pulmonary disease, unspecified: Secondary | ICD-10-CM

## 2019-10-01 NOTE — Progress Notes (Signed)
Daily Session Note  Patient Details  Name: Lee [name] MRN: [id number] Date of Birth: Dec 20, 1945 Referring Provider:     Pulmonary Rehab Walk Test from 09/21/2019 in [place]  Referring Provider  Dr. Radford Pax      Encounter Date: 10/01/2019  Check In: Session Check In - 10/01/19 1126      Check-In   Supervising physician immediately available to respond to emergencies  [facility] immediately available    Physician(s)  Dr. Broadus John    Location  [facility]    Staff Present  Rosebud Poles, RN, Bjorn Loser, MS, Exercise Physiologist;Brittany Durene Fruits, BS, ACSM CEP, Exercise Physiologist    Virtual Visit  No    Medication changes reported      No    Fall or balance concerns reported     No    Tobacco Cessation  No Change    Warm-up and Cool-down  Performed on first and last piece of equipment    Resistance Training Performed  Yes    VAD Patient?  No    PAD/SET Patient?  No      Pain Assessment   Currently in Pain?  No/denies    Multiple Pain Sites  No       Capillary Blood Glucose: No results found for this or any previous visit (from the past 24 hour(s)).    Social History   Tobacco Use  Smoking Status Former Smoker  . Quit date: 06/09/2005  . Years since quitting: 14.3  Smokeless Tobacco Never Used    Goals Met:  Independence with exercise equipment Exercise tolerated well Strength training completed today  Goals Unmet:  Not Applicable  Comments: Service time is from 0950 to 1045    Dr. Rush Farmer is Medical Director for Pulmonary Rehab at [facility].

## 2019-10-06 ENCOUNTER — Other Ambulatory Visit: Payer: Self-pay

## 2019-10-06 ENCOUNTER — Encounter (HOSPITAL_COMMUNITY)
Admission: RE | Admit: 2019-10-06 | Discharge: 2019-10-06 | Disposition: A | Discharge status: Home / Self Care | Payer: No Typology Code available for payment source | Source: Ambulatory Visit | Attending: Pulmonary Disease | Admitting: Pulmonary Disease

## 2019-10-06 VITALS — Wt 164.5 lb

## 2019-10-06 DIAGNOSIS — J449 Chronic obstructive pulmonary disease, unspecified: Secondary | ICD-10-CM

## 2019-10-06 NOTE — Progress Notes (Signed)
Daily Session Note  Patient Details  Name: Lee [name] MRN: [id number] Date of Birth: 12-Sep-1945 Referring Provider:     Pulmonary Rehab Walk Test from 09/21/2019 in [place]  Referring Provider  Dr. Radford Pax      Encounter Date: 10/06/2019  Check In: Session Check In - 10/06/19 1022      Check-In   Supervising physician immediately available to respond to emergencies  [facility] immediately available    Physician(s)  Dr. Broadus John    Location  [facility]    Staff Present  Rosebud Poles, RN, BSN;Kalan Rinn [name], RN;Dalton Fletcher, MS, Exercise Physiologist    Virtual Visit  No    Medication changes reported      No    Fall or balance concerns reported     No    Tobacco Cessation  No Change    Warm-up and Cool-down  Performed as group-led instruction    Resistance Training Performed  Yes    VAD Patient?  No    PAD/SET Patient?  No      Pain Assessment   Currently in Pain?  No/denies    Multiple Pain Sites  No       Capillary Blood Glucose: No results found for this or any previous visit (from the past 24 hour(s)).  Exercise Prescription Changes - 10/06/19 1100      Response to Exercise   Blood Pressure (Admit)  112/54    Blood Pressure (Exercise)  124/56    Blood Pressure (Exit)  102/54    Heart Rate (Admit)  106 bpm    Heart Rate (Exercise)  110 bpm    Heart Rate (Exit)  103 bpm    Oxygen Saturation (Admit)  97 %    Oxygen Saturation (Exercise)  96 %    Oxygen Saturation (Exit)  97 %    Rating of Perceived Exertion (Exercise)  12    Perceived Dyspnea (Exercise)  2    Duration  Continue with 30 min of aerobic exercise without signs/symptoms of physical distress.    Intensity  Other (comment)   40-80% of HRR     Progression   Progression  Continue to progress workloads to maintain intensity without signs/symptoms of physical distress.      Resistance Training   Training Prescription  Yes    Weight  blue  bands    Reps  10-15    Time  10 Minutes      Recumbant Bike   Level  2    Minutes  15      NuStep   Level  4    SPM  80    Minutes  15    METs  3       Social History   Tobacco Use  Smoking Status Former Smoker  . Quit date: 06/09/2005  . Years since quitting: 14.3  Smokeless Tobacco Never Used    Goals Met:  Exercise tolerated well No report of cardiac concerns or symptoms Strength training completed today  Goals Unmet:  Not Applicable  Comments: Service time is from 0950 to 1047     Dr. Fransico Him is Medical Director for Cardiac Rehab at [facility].

## 2019-10-08 ENCOUNTER — Other Ambulatory Visit: Payer: Self-pay

## 2019-10-08 ENCOUNTER — Encounter (HOSPITAL_COMMUNITY)
Admission: RE | Admit: 2019-10-08 | Discharge: 2019-10-08 | Disposition: A | Discharge status: Home / Self Care | Payer: No Typology Code available for payment source | Source: Ambulatory Visit | Attending: Pulmonary Disease | Admitting: Pulmonary Disease

## 2019-10-08 DIAGNOSIS — J449 Chronic obstructive pulmonary disease, unspecified: Secondary | ICD-10-CM | POA: Diagnosis not present

## 2019-10-08 NOTE — Progress Notes (Signed)
Jeanclaude Quade 74 y.o. male Nutrition Note  Visit Diagnosis: Chronic obstructive pulmonary disease, unspecified COPD type ([place])   Past Medical History:  Diagnosis Date  . Acid reflux   . Alcohol abuse    quit 10 y ago  . Arthritis   . Cervical spondylosis without myelopathy 06/02/2015  . Emphysema lung ([place])   . High cholesterol   . Hypertension   . Tremor 06/02/2015     Medications reviewed.   Current Outpatient Medications:  .  albuterol (PROVENTIL HFA;VENTOLIN HFA) 108 (90 Base) MCG/ACT inhaler, Inhale 2 puffs into the lungs every 4 (four) hours as needed for wheezing or shortness of breath., Disp: 1 Inhaler, Rfl: 0 .  budesonide-formoterol (SYMBICORT) 80-4.5 MCG/ACT inhaler, Inhale 2 puffs into the lungs 2 (two) times daily., Disp: , Rfl:  .  Ergocalciferol (VITAMIN D2) 50 MCG (2000 UT) TABS, Take by mouth., Disp: , Rfl:  .  folic acid (FOLVITE) 1 MG tablet, Take 1 mg by mouth daily., Disp: , Rfl:  .  Multiple Vitamin (QUINTABS) TABS, Take by mouth., Disp: , Rfl:  .  oxybutynin (DITROPAN-XL) 10 MG 24 hr tablet, Take 10 mg by mouth at bedtime., Disp: , Rfl:  .  pantoprazole (PROTONIX) 40 MG tablet, Take by mouth., Disp: , Rfl:  .  tamsulosin (FLOMAX) 0.4 MG CAPS capsule, Take 0.4 mg by mouth., Disp: , Rfl:  .  Tiotropium Bromide Monohydrate (SPIRIVA RESPIMAT) 2.5 MCG/ACT AERS, Inhale 1 puff into the lungs daily., Disp: , Rfl:  .  Turmeric POWD, Take 5 mLs by mouth daily., Disp: , Rfl:    Ht Readings from Last 1 Encounters:  09/21/19 5\' 11"  (1.803 m)     Wt Readings from Last 3 Encounters:  10/06/19 164 lb 7.4 oz (74.6 kg)  09/21/19 164 lb 0.4 oz (74.4 kg)  06/04/19 167 lb (75.8 kg)     There is no height or weight on file to calculate BMI.   Social History   Tobacco Use  Smoking Status Former Smoker  . Quit date: 06/09/2005  . Years since quitting: 14.3  Smokeless Tobacco Never Used     Nutrition Note  Spoke with pt. Nutrition Plan and Nutrition Survey  goals reviewed with pt.    Pt eating less than 2 meals per day. He eats small amount of fruit late morning and a salad with protein for dinner. He does a little snacking on peanut butter and potato chips occasionally.   He is exercising multiple times per day at home.   He denies unintended weight loss.   Pt expressed understanding of the information reviewed.    Nutrition Diagnosis Inadequate oral intake related to poor appetite as evidenced by pt report of eating 1 meal per day.  Nutrition Intervention ? Pt's individual nutrition plan reviewed with pt. ? Recommended monitor weight daily  ? Recommend easy-to-prepare snacks/meals or nutrition supplement if weight declines ? Continue client-centered nutrition education by RD, as part of interdisciplinary care.  Goal(s) ? Pt to maintain current weight >164 lbs. ? Pt to add >300 kcals per day when he skips meals   Plan:   Will provide client-centered nutrition education as part of interdisciplinary care  Monitor and evaluate progress toward nutrition goal with team.   Michaele Offer, MS, RDN, LDN

## 2019-10-08 NOTE — Progress Notes (Signed)
Daily Session Note  Patient Details  Name: Lee [name] MRN: [id number] Date of Birth: Jul 10, 1946 Referring Provider:     Pulmonary Rehab Walk Test from 09/21/2019 in [place]  Referring Provider  Dr. Radford Pax      Encounter Date: 10/08/2019  Check In: Session Check In - 10/08/19 1116      Check-In   Supervising physician immediately available to respond to emergencies  [facility] immediately available    Physician(s)  Dr. Dessa Phi    Location  [facility]    Staff Present  Rosebud Poles, RN, BSN;Alyviah Crandle [name], RN;Dalton Fletcher, MS, Exercise Physiologist    Virtual Visit  No    Medication changes reported      No    Fall or balance concerns reported     No    Tobacco Cessation  No Change    Warm-up and Cool-down  Performed as group-led instruction    Resistance Training Performed  Yes    VAD Patient?  No    PAD/SET Patient?  No      Pain Assessment   Currently in Pain?  No/denies    Multiple Pain Sites  No       Capillary Blood Glucose: No results found for this or any previous visit (from the past 24 hour(s)).    Social History   Tobacco Use  Smoking Status Former Smoker  . Quit date: 06/09/2005  . Years since quitting: 14.3  Smokeless Tobacco Never Used    Goals Met:  Exercise tolerated well No report of cardiac concerns or symptoms Strength training completed today  Goals Unmet:  Not Applicable  Comments: Service time is from 1000 to 1105    Dr. Fransico Him is Medical Director for Cardiac Rehab at [facility].

## 2019-10-13 ENCOUNTER — Encounter (HOSPITAL_COMMUNITY)
Admission: RE | Admit: 2019-10-13 | Discharge: 2019-10-13 | Disposition: A | Discharge status: Home / Self Care | Payer: No Typology Code available for payment source | Source: Ambulatory Visit | Attending: Pulmonary Disease | Admitting: Pulmonary Disease

## 2019-10-13 ENCOUNTER — Other Ambulatory Visit: Payer: Self-pay

## 2019-10-13 DIAGNOSIS — J449 Chronic obstructive pulmonary disease, unspecified: Secondary | ICD-10-CM | POA: Diagnosis not present

## 2019-10-13 NOTE — Progress Notes (Signed)
I have reviewed a Home Exercise Prescription with [name] . Lee [name] is  currently exercising at home.  The patient was advised to walk 4 days a week for 30-45 minutes.  Winferd Humphrey and I discussed how to progress their exercise prescription.  The patient stated that their goals were to maintain current physical status.  The patient stated that they understand the exercise prescription.  We reviewed exercise guidelines, target heart rate during exercise, RPE Scale, weather conditions, NTG use, endpoints for exercise, warmup and cool down.  Patient is encouraged to come to me with any questions. I will continue to follow up with the patient to assist them with progression and safety.    Hoy Register, M.S., ACSM CEP

## 2019-10-13 NOTE — Progress Notes (Signed)
Daily Session Note  Patient Details  Name: Lee [name] MRN: [id number] Date of Birth: [birth year] Referring Provider:     Pulmonary Rehab Walk Test from 09/21/2019 in [place]  Referring Provider  Dr. Radford Pax      Encounter Date: 10/13/2019  Check In: Session Check In - 10/13/19 1020      Check-In   Supervising physician immediately available to respond to emergencies  [facility] immediately available    Physician(s)  Dr. Dessa Phi    Location  [facility]    Staff Present  Rosebud Poles, RN, Bjorn Loser, MS, Exercise Physiologist;Sharnise Blough [name], RN    Virtual Visit  No    Medication changes reported      No    Fall or balance concerns reported     No    Tobacco Cessation  No Change    Warm-up and Cool-down  Performed as group-led instruction    Resistance Training Performed  Yes    VAD Patient?  No    PAD/SET Patient?  No      Pain Assessment   Currently in Pain?  No/denies    Multiple Pain Sites  No       Capillary Blood Glucose: No results found for this or any previous visit (from the past 24 hour(s)).    Social History   Tobacco Use  Smoking Status Former Smoker  . Quit date: 06/09/2005  . Years since quitting: 14.3  Smokeless Tobacco Never Used    Goals Met:  Exercise tolerated well No report of cardiac concerns or symptoms Strength training completed today  Goals Unmet:  Not Applicable  Comments: Service time is from 1000 to 1102    Dr. Fransico Him is Medical Director for Cardiac Rehab at [facility].

## 2019-10-15 ENCOUNTER — Other Ambulatory Visit: Payer: Self-pay

## 2019-10-15 ENCOUNTER — Encounter (HOSPITAL_COMMUNITY)
Admission: RE | Admit: 2019-10-15 | Discharge: 2019-10-15 | Disposition: A | Discharge status: Home / Self Care | Payer: No Typology Code available for payment source | Source: Ambulatory Visit | Attending: Pulmonary Disease | Admitting: Pulmonary Disease

## 2019-10-15 DIAGNOSIS — J449 Chronic obstructive pulmonary disease, unspecified: Secondary | ICD-10-CM | POA: Diagnosis not present

## 2019-10-15 NOTE — Progress Notes (Signed)
Daily Session Note  Patient Details  Name: Lee [name] MRN: [id number] Date of Birth: 09-15-1945 Referring Provider:     Pulmonary Rehab Walk Test from 09/21/2019 in [place]  Referring Provider  Dr. Radford Pax      Encounter Date: 10/15/2019  Check In: Session Check In - 10/15/19 1123      Check-In   Supervising physician immediately available to respond to emergencies  [facility] immediately available    Physician(s)  Dr. Darrick Meigs    Location  [facility]    Staff Present  Rosebud Poles, RN, Bjorn Loser, MS, Exercise Physiologist;Ameka Krigbaum [name], RN    Virtual Visit  No    Medication changes reported      No    Fall or balance concerns reported     No    Tobacco Cessation  No Change    Warm-up and Cool-down  Performed as group-led instruction    Resistance Training Performed  Yes    VAD Patient?  No    PAD/SET Patient?  No      Pain Assessment   Currently in Pain?  No/denies    Multiple Pain Sites  No       Capillary Blood Glucose: No results found for this or any previous visit (from the past 24 hour(s)).    Social History   Tobacco Use  Smoking Status Former Smoker  . Quit date: 06/09/2005  . Years since quitting: 14.3  Smokeless Tobacco Never Used    Goals Met:  Exercise tolerated well No report of cardiac concerns or symptoms Strength training completed today  Goals Unmet:  Not Applicable  Comments: Service time is from 1005 to 1110    Dr. Fransico Him is Medical Director for Cardiac Rehab at [facility].

## 2019-10-20 ENCOUNTER — Encounter (HOSPITAL_COMMUNITY)
Admission: RE | Admit: 2019-10-20 | Discharge: 2019-10-20 | Disposition: A | Discharge status: Home / Self Care | Payer: No Typology Code available for payment source | Source: Ambulatory Visit | Attending: Pulmonary Disease | Admitting: Pulmonary Disease

## 2019-10-20 ENCOUNTER — Other Ambulatory Visit: Payer: Self-pay

## 2019-10-20 VITALS — Wt 162.3 lb

## 2019-10-20 DIAGNOSIS — J449 Chronic obstructive pulmonary disease, unspecified: Secondary | ICD-10-CM | POA: Diagnosis not present

## 2019-10-20 NOTE — Progress Notes (Signed)
Pulmonary Individual Treatment Plan  Patient Details  Name: Lee [name] MRN: [id number] Date of Birth: 01-19-1946 Referring Provider:     Pulmonary Rehab Walk Test from 09/21/2019 in [place]  Referring Provider  Dr. Radford Pax      Initial Encounter Date:    Pulmonary Rehab Walk Test from 09/21/2019 in [place]  Date  09/21/19      Visit Diagnosis: Chronic obstructive pulmonary disease, unspecified COPD type ([place])  Patient's Home Medications on Admission:   Current Outpatient Medications:  .  albuterol (PROVENTIL HFA;VENTOLIN HFA) 108 (90 Base) MCG/ACT inhaler, Inhale 2 puffs into the lungs every 4 (four) hours as needed for wheezing or shortness of breath., Disp: 1 Inhaler, Rfl: 0 .  budesonide-formoterol (SYMBICORT) 80-4.5 MCG/ACT inhaler, Inhale 2 puffs into the lungs 2 (two) times daily., Disp: , Rfl:  .  Ergocalciferol (VITAMIN D2) 50 MCG (2000 UT) TABS, Take by mouth., Disp: , Rfl:  .  folic acid (FOLVITE) 1 MG tablet, Take 1 mg by mouth daily., Disp: , Rfl:  .  Multiple Vitamin (QUINTABS) TABS, Take by mouth., Disp: , Rfl:  .  oxybutynin (DITROPAN-XL) 10 MG 24 hr tablet, Take 10 mg by mouth at bedtime., Disp: , Rfl:  .  pantoprazole (PROTONIX) 40 MG tablet, Take by mouth., Disp: , Rfl:  .  tamsulosin (FLOMAX) 0.4 MG CAPS capsule, Take 0.4 mg by mouth., Disp: , Rfl:  .  Tiotropium Bromide Monohydrate (SPIRIVA RESPIMAT) 2.5 MCG/ACT AERS, Inhale 1 puff into the lungs daily., Disp: , Rfl:  .  Turmeric POWD, Take 5 mLs by mouth daily., Disp: , Rfl:   Past Medical History: Past Medical History:  Diagnosis Date  . Acid reflux   . Alcohol abuse    quit 10 y ago  . Arthritis   . Cervical spondylosis without myelopathy 06/02/2015  . Emphysema lung ([place])   . High cholesterol   . Hypertension   . Tremor 06/02/2015    Tobacco Use: Social History   Tobacco Use  Smoking Status Former Smoker  . Quit date:  06/09/2005  . Years since quitting: 14.3  Smokeless Tobacco Never Used    Labs: Recent Review [facility] for [facility] and Pulmonary Rehab Latest Ref Rng & Units 01/05/2017   PHART 7.350 - 7.450 7.403   PCO2ART 32.0 - 48.0 mmHg 39.7   HCO3 20.0 - 28.0 mmol/L 24.4   O2SAT % 92.6      Capillary Blood Glucose: No results found for: GLUCAP   Pulmonary Assessment Scores: Pulmonary Assessment Scores    Row Name 09/21/19 1126 09/21/19 1129       ADL UCSD   ADL Phase  Entry  Entry    SOB Score total  75  --      CAT Score   CAT Score  23  --      mMRC Score   mMRC Score  --  3      UCSD: Self-administered rating of dyspnea associated with activities of daily living (ADLs) 6-point scale (0 = "not at all" to 5 = "maximal or unable to do because of breathlessness")  Scoring Scores range from 0 to 120.  Minimally important difference is 5 units  CAT: CAT can identify the health impairment of COPD patients and is better correlated with disease progression.  CAT has a scoring range of zero to 40. The CAT score is classified into four groups of  low (less than 10), medium (10 - 20), high (21-30) and very high (31-40) based on the impact level of disease on health status. A CAT score over 10 suggests significant symptoms.  A worsening CAT score could be explained by an exacerbation, poor medication adherence, poor inhaler technique, or progression of COPD or comorbid conditions.  CAT MCID is 2 points  mMRC: mMRC (Modified Medical Research Council) Dyspnea Scale is used to assess the degree of baseline functional disability in patients of respiratory disease due to dyspnea. No minimal important difference is established. A decrease in score of 1 point or greater is considered a positive change.   Pulmonary Function Assessment: Pulmonary Function Assessment - 09/21/19 0942      Breath   Bilateral Breath Sounds  Clear    Shortness of Breath  Yes;Limiting activity        Exercise Target Goals: Exercise Program Goal: Individual exercise prescription set using results from initial 6 min walk test and THRR while considering  patient's activity barriers and safety.   Exercise Prescription Goal: Initial exercise prescription builds to 30-45 minutes a day of aerobic activity, 2-3 days per week.  Home exercise guidelines will be given to patient during program as part of exercise prescription that the participant will acknowledge.  Activity Barriers & Risk Stratification: Activity Barriers & Cardiac Risk Stratification - 09/21/19 0934      Activity Barriers & Cardiac Risk Stratification   Activity Barriers  Arthritis;Shortness of Breath;Balance Concerns;History of Falls       6 Minute Walk: 6 Minute Walk    Row Name 09/21/19 1129         6 Minute Walk   Phase  Initial     Distance  1367 feet     Walk Time  6 minutes     # of Rest Breaks  0     MPH  2.59     METS  3.21     RPE  12     Perceived Dyspnea   2     VO2 Peak  11.23     Symptoms  No     Resting HR  81 bpm     Resting BP  106/64     Resting Oxygen Saturation   98 %     Exercise Oxygen Saturation  during 6 min walk  90 %     Max Ex. HR  111 bpm     Max Ex. BP  110/64     2 Minute Post BP  106/68       Interval HR   1 Minute HR  107     2 Minute HR  107     3 Minute HR  108     4 Minute HR  111     5 Minute HR  107     6 Minute HR  107     2 Minute Post HR  90     Interval Heart Rate?  Yes       Interval Oxygen   Interval Oxygen?  Yes     Baseline Oxygen Saturation %  98 %     1 Minute Oxygen Saturation %  97 %     1 Minute Liters of Oxygen  0 L     2 Minute Oxygen Saturation %  91 %     2 Minute Liters of Oxygen  0 L     3 Minute Oxygen Saturation %  93 %  3 Minute Liters of Oxygen  0 L     4 Minute Oxygen Saturation %  92 %     4 Minute Liters of Oxygen  0 L     5 Minute Oxygen Saturation %  95 %     5 Minute Liters of Oxygen  0 L     6 Minute Oxygen Saturation  %  90 %     6 Minute Liters of Oxygen  0 L     2 Minute Post Oxygen Saturation %  97 %     2 Minute Post Liters of Oxygen  0 L        Oxygen Initial Assessment: Oxygen Initial Assessment - 09/21/19 1128      Home Oxygen   Home Oxygen Device  None    Sleep Oxygen Prescription  None    Home Exercise Oxygen Prescription  None    Home at Rest Exercise Oxygen Prescription  None    Compliance with Home Oxygen Use  Yes      Initial 6 min Walk   Oxygen Used  None      Program Oxygen Prescription   Program Oxygen Prescription  None      Intervention   Short Term Goals  To learn and exhibit compliance with exercise, home and travel O2 prescription;To learn and understand importance of monitoring SPO2 with pulse oximeter and demonstrate accurate use of the pulse oximeter.;To learn and understand importance of maintaining oxygen saturations>88%;To learn and demonstrate proper pursed lip breathing techniques or other breathing techniques.;To learn and demonstrate proper use of respiratory medications    Long  Term Goals  Demonstrates proper use of MDI's;Compliance with respiratory medication;Exhibits proper breathing techniques, such as pursed lip breathing or other method taught during program session;Maintenance of O2 saturations>88%;Verbalizes importance of monitoring SPO2 with pulse oximeter and return demonstration;Exhibits compliance with exercise, home and travel O2 prescription       Oxygen Re-Evaluation: Oxygen Re-Evaluation    Row Name 10/20/19 0704             Program Oxygen Prescription   Program Oxygen Prescription  None         Home Oxygen   Home Oxygen Device  None       Sleep Oxygen Prescription  None       Home Exercise Oxygen Prescription  None       Home at Rest Exercise Oxygen Prescription  None       Compliance with Home Oxygen Use  Yes         Goals/Expected Outcomes   Short Term Goals  To learn and exhibit compliance with exercise, home and travel O2  prescription;To learn and understand importance of monitoring SPO2 with pulse oximeter and demonstrate accurate use of the pulse oximeter.;To learn and understand importance of maintaining oxygen saturations>88%;To learn and demonstrate proper pursed lip breathing techniques or other breathing techniques.;To learn and demonstrate proper use of respiratory medications       Long  Term Goals  Demonstrates proper use of MDI's;Compliance with respiratory medication;Exhibits proper breathing techniques, such as pursed lip breathing or other method taught during program session;Maintenance of O2 saturations>88%;Verbalizes importance of monitoring SPO2 with pulse oximeter and return demonstration;Exhibits compliance with exercise, home and travel O2 prescription       Goals/Expected Outcomes  compliance          Oxygen Discharge (Final Oxygen Re-Evaluation): Oxygen Re-Evaluation - 10/20/19 0704      Program Oxygen Prescription  Program Oxygen Prescription  None      Home Oxygen   Home Oxygen Device  None    Sleep Oxygen Prescription  None    Home Exercise Oxygen Prescription  None    Home at Rest Exercise Oxygen Prescription  None    Compliance with Home Oxygen Use  Yes      Goals/Expected Outcomes   Short Term Goals  To learn and exhibit compliance with exercise, home and travel O2 prescription;To learn and understand importance of monitoring SPO2 with pulse oximeter and demonstrate accurate use of the pulse oximeter.;To learn and understand importance of maintaining oxygen saturations>88%;To learn and demonstrate proper pursed lip breathing techniques or other breathing techniques.;To learn and demonstrate proper use of respiratory medications    Long  Term Goals  Demonstrates proper use of MDI's;Compliance with respiratory medication;Exhibits proper breathing techniques, such as pursed lip breathing or other method taught during program session;Maintenance of O2 saturations>88%;Verbalizes  importance of monitoring SPO2 with pulse oximeter and return demonstration;Exhibits compliance with exercise, home and travel O2 prescription    Goals/Expected Outcomes  compliance       Initial Exercise Prescription: Initial Exercise Prescription - 09/21/19 1100      Date of Initial Exercise RX and Referring Provider   Date  09/21/19    Referring Provider  Dr. Radford Pax      Recumbant Bike   Level  2    Minutes  15      NuStep   Level  3    SPM  80    Minutes  15      Prescription Details   Frequency (times per week)  2    Duration  Progress to 30 minutes of continuous aerobic without signs/symptoms of physical distress      Intensity   THRR 40-80% of Max Heartrate  59-118    Ratings of Perceived Exertion  11-13    Perceived Dyspnea  0-4      Progression   Progression  Continue to progress workloads to maintain intensity without signs/symptoms of physical distress.      Resistance Training   Training Prescription  Yes    Weight  blue bands    Reps  10-15       Perform Capillary Blood Glucose checks as needed.  Exercise Prescription Changes: Exercise Prescription Changes    Row Name 10/06/19 1100 10/13/19 1100           Response to Exercise   Blood Pressure (Admit)  112/54  --      Blood Pressure (Exercise)  124/56  --      Blood Pressure (Exit)  102/54  --      Heart Rate (Admit)  106 bpm  --      Heart Rate (Exercise)  110 bpm  --      Heart Rate (Exit)  103 bpm  --      Oxygen Saturation (Admit)  97 %  --      Oxygen Saturation (Exercise)  96 %  --      Oxygen Saturation (Exit)  97 %  --      Rating of Perceived Exertion (Exercise)  12  --      Perceived Dyspnea (Exercise)  2  --      Duration  Continue with 30 min of aerobic exercise without signs/symptoms of physical distress.  --      Intensity  Other (comment) 40-80% of HRR  --  Progression   Progression  Continue to progress workloads to maintain intensity without signs/symptoms of physical  distress.  --        Horticulturist, commercial Prescription  Yes  --      Weight  blue bands  --      Reps  10-15  --      Time  10 Minutes  --        Recumbant Bike   Level  2  --      Minutes  15  --        NuStep   Level  4  --      SPM  80  --      Minutes  15  --      METs  3  --        Home Exercise Plan   Plans to continue exercise at  --  Home (comment)      Frequency  --  Add 4 additional days to program exercise sessions.      Initial Home Exercises Provided  --  10/13/19         Exercise Comments: Exercise Comments    Row Name 10/13/19 1143           Exercise Comments  home exercise complete          Exercise Goals and Review: Exercise Goals    Row Name 09/21/19 1139 10/20/19 0704           Exercise Goals   Increase Physical Activity  Yes  Yes      Intervention  Provide advice, education, support and counseling about physical activity/exercise needs.;Develop an individualized exercise prescription for aerobic and resistive training based on initial evaluation findings, risk stratification, comorbidities and participant's personal goals.  Provide advice, education, support and counseling about physical activity/exercise needs.;Develop an individualized exercise prescription for aerobic and resistive training based on initial evaluation findings, risk stratification, comorbidities and participant's personal goals.      Expected Outcomes  Short Term: Attend rehab on a regular basis to increase amount of physical activity.;Long Term: Add in home exercise to make exercise part of routine and to increase amount of physical activity.;Long Term: Exercising regularly at least 3-5 days a week.  Short Term: Attend rehab on a regular basis to increase amount of physical activity.;Long Term: Add in home exercise to make exercise part of routine and to increase amount of physical activity.;Long Term: Exercising regularly at least 3-5 days a week.      Increase  Strength and Stamina  Yes  Yes      Intervention  Provide advice, education, support and counseling about physical activity/exercise needs.;Develop an individualized exercise prescription for aerobic and resistive training based on initial evaluation findings, risk stratification, comorbidities and participant's personal goals.  Provide advice, education, support and counseling about physical activity/exercise needs.;Develop an individualized exercise prescription for aerobic and resistive training based on initial evaluation findings, risk stratification, comorbidities and participant's personal goals.      Expected Outcomes  Short Term: Increase workloads from initial exercise prescription for resistance, speed, and METs.;Short Term: Perform resistance training exercises routinely during rehab and add in resistance training at home;Long Term: Improve cardiorespiratory fitness, muscular endurance and strength as measured by increased METs and functional capacity (6MWT)  Short Term: Increase workloads from initial exercise prescription for resistance, speed, and METs.;Short Term: Perform resistance training exercises routinely during rehab and add in resistance training at home;Long Term:  Improve cardiorespiratory fitness, muscular endurance and strength as measured by increased METs and functional capacity (6MWT)      Able to understand and use rate of perceived exertion (RPE) scale  Yes  Yes      Intervention  Provide education and explanation on how to use RPE scale  Provide education and explanation on how to use RPE scale      Expected Outcomes  Short Term: Able to use RPE daily in rehab to express subjective intensity level;Long Term:  Able to use RPE to guide intensity level when exercising independently  Short Term: Able to use RPE daily in rehab to express subjective intensity level;Long Term:  Able to use RPE to guide intensity level when exercising independently      Able to understand and use  Dyspnea scale  Yes  Yes      Intervention  Provide education and explanation on how to use Dyspnea scale  Provide education and explanation on how to use Dyspnea scale      Expected Outcomes  Short Term: Able to use Dyspnea scale daily in rehab to express subjective sense of shortness of breath during exertion;Long Term: Able to use Dyspnea scale to guide intensity level when exercising independently  Short Term: Able to use Dyspnea scale daily in rehab to express subjective sense of shortness of breath during exertion;Long Term: Able to use Dyspnea scale to guide intensity level when exercising independently      Knowledge and understanding of Target Heart Rate Range (THRR)  Yes  Yes      Expected Outcomes  Short Term: Able to state/look up THRR;Short Term: Able to use daily as guideline for intensity in rehab;Long Term: Able to use THRR to govern intensity when exercising independently  Short Term: Able to state/look up THRR;Short Term: Able to use daily as guideline for intensity in rehab;Long Term: Able to use THRR to govern intensity when exercising independently      Understanding of Exercise Prescription  Yes  Yes      Intervention  Provide education, explanation, and written materials on patient's individual exercise prescription  Provide education, explanation, and written materials on patient's individual exercise prescription      Expected Outcomes  Short Term: Able to explain program exercise prescription;Long Term: Able to explain home exercise prescription to exercise independently  Short Term: Able to explain program exercise prescription;Long Term: Able to explain home exercise prescription to exercise independently         Exercise Goals Re-Evaluation : Exercise Goals Re-Evaluation    Row Name 10/20/19 0705             Exercise Goal Re-Evaluation   Exercise Goals Review  Increase Physical Activity;Increase Strength and Stamina;Able to understand and use rate of perceived exertion  (RPE) scale;Able to understand and use Dyspnea scale;Knowledge and understanding of Target Heart Rate Range (THRR);Understanding of Exercise Prescription       Comments  Lee [name] has attended 6 exercise sessions. He is highly motivated and tries to beat his previous MET level every time. He is currently exercising at 3.3 METs on the stepper. Will continue to monitor and progress as able.       Expected Outcomes  Through exercise at rehab and at home, the patient will decrease shortness of breath with daily activities and feel confident in carrying out an exercise regime at home.          Discharge Exercise Prescription (Final Exercise Prescription Changes): Exercise Prescription Changes -  10/13/19 1100      Home Exercise Plan   Plans to continue exercise at  Home (comment)    Frequency  Add 4 additional days to program exercise sessions.    Initial Home Exercises Provided  10/13/19       Nutrition:  Target Goals: Understanding of nutrition guidelines, daily intake of sodium <1512m, cholesterol <2048m calories 30% from fat and 7% or less from saturated fats, daily to have 5 or more servings of fruits and vegetables.  Biometrics: Pre Biometrics - 09/21/19 0940      Pre Biometrics   Height  _0  (1.803 m)    Weight  74.4 kg    BMI (Calculated)  22.89    Grip Strength  34 kg        Nutrition Therapy Plan and Nutrition Goals: Nutrition Therapy & Goals - 10/08/19 1147      Nutrition Therapy   Diet  High calorie/high protein      Personal Nutrition Goals   Nutrition Goal  Lee [name] to maintain current weight >164 lbs.    Personal Goal #2  Lee [name] to add >300 kcals per day when he skips meals      Intervention Plan   Intervention  Prescribe, educate and counsel regarding individualized specific dietary modifications aiming towards targeted core components such as weight, hypertension, lipid management, diabetes, heart failure and other comorbidities.    Expected Outcomes  Short Term Goal: A plan  has been developed with personal nutrition goals set during dietitian appointment.       Nutrition Assessments: Nutrition Assessments - 10/08/19 1147      Rate Your Plate Scores   Pre Score  60       Nutrition Goals Re-Evaluation: Nutrition Goals Re-Evaluation    Row Name 10/08/19 1147             Goals   Current Weight  164 lb (74.4 kg)          Nutrition Goals Discharge (Final Nutrition Goals Re-Evaluation): Nutrition Goals Re-Evaluation - 10/08/19 1147      Goals   Current Weight  164 lb (74.4 kg)       Psychosocial: Target Goals: Acknowledge presence or absence of significant depression and/or stress, maximize coping skills, provide positive support system. Participant is able to verbalize types and ability to use techniques and skills needed for reducing stress and depression.  Initial Review & Psychosocial Screening: Initial Psych Review & Screening - 09/21/19 0943      Initial Review   Current issues with  None Identified      Family Dynamics   Good Support System?  Yes      Barriers   Psychosocial barriers to participate in program  There are no identifiable barriers or psychosocial needs.      Screening Interventions   Interventions  Encouraged to exercise       Quality of Life Scores:  Scores of 19 and below usually indicate a poorer quality of life in these areas.  A difference of  2-3 points is a clinically meaningful difference.  A difference of 2-3 points in the total score of the Quality of Life Index has been associated with significant improvement in overall quality of life, self-image, physical symptoms, and general health in studies assessing change in quality of life.  PHQ-9: Recent Review Flowsheet Data    [facility] 09/21/2019 09/21/2019 08/27/2017   Decreased Interest 0 0 0   Down, Depressed, Hopeless 0 0  3   PHQ - 2 Score 0 0 3   Altered sleeping 0 - 1   Tired, decreased energy 0 - 1   Change in appetite 0 - 0    Feeling bad or failure about yourself  0 - 1   Trouble concentrating 0 - 1   Moving slowly or fidgety/restless 0 - 0   Suicidal thoughts 0 - 0   PHQ-9 Score 0 - 7   Difficult doing work/chores Not difficult at all - Somewhat difficult     Interpretation of Total Score  Total Score Depression Severity:  1-4 = Minimal depression, 5-9 = Mild depression, 10-14 = Moderate depression, 15-19 = Moderately severe depression, 20-27 = Severe depression   Psychosocial Evaluation and Intervention: Psychosocial Evaluation - 09/21/19 0944      Psychosocial Evaluation & Interventions   Interventions  Encouraged to exercise with the program and follow exercise prescription    Comments  No psychosocial barriers or concerns identified    Expected Outcomes  Will continue to have no barriers or concerns    Continue Psychosocial Services   No Follow up required       Psychosocial Re-Evaluation: Psychosocial Re-Evaluation    [place] Name 10/19/19 1246             Psychosocial Re-Evaluation   Current issues with  None Identified       Comments  no psychosocial concerns are identified       Expected Outcomes  Will continue to have no barriers or psychosocial concerns while in pulmonary rehab.       Interventions  Encouraged to attend Pulmonary Rehabilitation for the exercise       Continue Psychosocial Services   No Follow up required          Psychosocial Discharge (Final Psychosocial Re-Evaluation): Psychosocial Re-Evaluation - 10/19/19 1246      Psychosocial Re-Evaluation   Current issues with  None Identified    Comments  no psychosocial concerns are identified    Expected Outcomes  Will continue to have no barriers or psychosocial concerns while in pulmonary rehab.    Interventions  Encouraged to attend Pulmonary Rehabilitation for the exercise    Continue Psychosocial Services   No Follow up required       Education: Education Goals: Education classes will be provided on a weekly basis,  covering required topics. Participant will state understanding/return demonstration of topics presented.  Learning Barriers/Preferences: Learning Barriers/Preferences - 09/21/19 0946      Learning Barriers/Preferences   Learning Barriers  None    Learning Preferences  Audio;Computer/Internet;Group Instruction;Pictoral;Skilled Demonstration;Individual Instruction;Verbal Instruction;Video;Written Material       Education Topics: Risk Factor Reduction:  -Group instruction that is supported by a PowerPoint presentation. Instructor discusses the definition of a risk factor, different risk factors for pulmonary disease, and how the heart and lungs work together.     Nutrition for Pulmonary Patient:  -Group instruction provided by PowerPoint slides, verbal discussion, and written materials to support subject matter. The instructor gives an explanation and review of healthy diet recommendations, which includes a discussion on weight management, recommendations for fruit and vegetable consumption, as well as protein, fluid, caffeine, fiber, sodium, sugar, and alcohol. Tips for eating when patients are short of breath are discussed.   Pursed Lip Breathing:  -Group instruction that is supported by demonstration and informational handouts. Instructor discusses the benefits of pursed lip and diaphragmatic breathing and detailed demonstration on how to preform both.  Oxygen Safety:  -Group instruction provided by PowerPoint, verbal discussion, and written material to support subject matter. There is an overview of "What is Oxygen" and "Why do we need it".  Instructor also reviews how to create a safe environment for oxygen use, the importance of using oxygen as prescribed, and the risks of noncompliance. There is a brief discussion on traveling with oxygen and resources the patient may utilize.   Oxygen Equipment:  -Group instruction provided by [facility] Staff utilizing handouts, written  materials, and equipment demonstrations.   Signs and Symptoms:  -Group instruction provided by written material and verbal discussion to support subject matter. Warning signs and symptoms of infection, stroke, and heart attack are reviewed and when to call the physician/911 reinforced. Tips for preventing the spread of infection discussed.   Advanced Directives:  -Group instruction provided by verbal instruction and written material to support subject matter. Instructor reviews Advanced Directive laws and proper instruction for filling out document.   Pulmonary Video:  -Group video education that reviews the importance of medication and oxygen compliance, exercise, good nutrition, pulmonary hygiene, and pursed lip and diaphragmatic breathing for the pulmonary patient.   Exercise for the Pulmonary Patient:  -Group instruction that is supported by a PowerPoint presentation. Instructor discusses benefits of exercise, core components of exercise, frequency, duration, and intensity of an exercise routine, importance of utilizing pulse oximetry during exercise, safety while exercising, and options of places to exercise outside of rehab.     Pulmonary Medications:  -Verbally interactive group education provided by instructor with focus on inhaled medications and proper administration.   Anatomy and Physiology of the Respiratory System and Intimacy:  -Group instruction provided by PowerPoint, verbal discussion, and written material to support subject matter. Instructor reviews respiratory cycle and anatomical components of the respiratory system and their functions. Instructor also reviews differences in obstructive and restrictive respiratory diseases with examples of each. Intimacy, Sex, and Sexuality differences are reviewed with a discussion on how relationships can change when diagnosed with pulmonary disease. Common sexual concerns are reviewed.   PULMONARY REHAB CHRONIC OBSTRUCTIVE PULMONARY  DISEASE from 10/08/2019 in [place]  Date  10/08/19  Educator  Handout      MD DAY -A group question and answer session with a medical doctor that allows participants to ask questions that relate to their pulmonary disease state.   OTHER EDUCATION -Group or individual verbal, written, or video instructions that support the educational goals of the pulmonary rehab program.   PULMONARY REHAB CHRONIC OBSTRUCTIVE PULMONARY DISEASE from 10/08/2019 in [place]  Date  10/01/19  Educator  DF [Know your numbers]  Instruction Review Code  2- Demonstrated Understanding      Holiday Eating Survival Tips:  -Group instruction provided by PowerPoint slides, verbal discussion, and written materials to support subject matter. The instructor gives patients tips, tricks, and techniques to help them not only survive but enjoy the holidays despite the onslaught of food that accompanies the holidays.   Knowledge Questionnaire Score: Knowledge Questionnaire Score - 09/21/19 1127      Knowledge Questionnaire Score   Pre Score  8/18       Core Components/Risk Factors/Patient Goals at Admission: Personal Goals and Risk Factors at Admission - 09/21/19 0946      Core Components/Risk Factors/Patient Goals on Admission   Improve shortness of breath with ADL's  Yes    Intervention  Provide education, individualized exercise plan and daily activity  instruction to help decrease symptoms of SOB with activities of daily living.    Expected Outcomes  Short Term: Improve cardiorespiratory fitness to achieve a reduction of symptoms when performing ADLs;Long Term: Be able to perform more ADLs without symptoms or delay the onset of symptoms       Core Components/Risk Factors/Patient Goals Review:  Goals and Risk Factor Review    Row Name 09/21/19 0946 10/19/19 1247           Core Components/Risk Factors/Patient Goals Review   Personal Goals  Review  Develop more efficient breathing techniques such as purse lipped breathing and diaphragmatic breathing and practicing self-pacing with activity.;Increase knowledge of respiratory medications and ability to use respiratory devices properly.;Improve shortness of breath with ADL's  Develop more efficient breathing techniques such as purse lipped breathing and diaphragmatic breathing and practicing self-pacing with activity.;Increase knowledge of respiratory medications and ability to use respiratory devices properly.;Improve shortness of breath with ADL's      Review  --  He is a joy to have in the program, always positive, up to level 4 on nustep and level 2.5 on the recumbent bike, has attended 6 exercise sessions.      Expected Outcomes  --  See admission goals         Core Components/Risk Factors/Patient Goals at Discharge (Final Review):  Goals and Risk Factor Review - 10/19/19 1247      Core Components/Risk Factors/Patient Goals Review   Personal Goals Review  Develop more efficient breathing techniques such as purse lipped breathing and diaphragmatic breathing and practicing self-pacing with activity.;Increase knowledge of respiratory medications and ability to use respiratory devices properly.;Improve shortness of breath with ADL's    Review  He is a joy to have in the program, always positive, up to level 4 on nustep and level 2.5 on the recumbent bike, has attended 6 exercise sessions.    Expected Outcomes  See admission goals       ITP Comments:   Comments: ITP REVIEW Lee [name] is making expected progress toward pulmonary rehab goals after completing 6 sessions. Recommend continued exercise, life style modification, education, and utilization of breathing techniques to increase stamina and strength and decrease shortness of breath with exertion.

## 2019-10-20 NOTE — Progress Notes (Signed)
Daily Session Note  Patient Details  Name: Lee [name] MRN: [id number] Date of Birth: 11/24/1945 Referring Provider:     Pulmonary Rehab Walk Test from 09/21/2019 in [place]  Referring Provider  Dr. Radford Pax      Encounter Date: 10/20/2019  Check In: Session Check In - 10/20/19 1213      Check-In   Supervising physician immediately available to respond to emergencies  [facility] immediately available    Physician(s)  Dr. Darrick Meigs    Location  [facility]    Staff Present  Rosebud Poles, RN, Bjorn Loser, MS, Exercise Physiologist;Durant Scibilia [name], RN    Virtual Visit  No    Medication changes reported      No    Fall or balance concerns reported     No    Tobacco Cessation  No Change    Warm-up and Cool-down  Performed on first and last piece of equipment    Resistance Training Performed  Yes    VAD Patient?  No    PAD/SET Patient?  No      Pain Assessment   Currently in Pain?  No/denies    Multiple Pain Sites  No       Capillary Blood Glucose: No results found for this or any previous visit (from the past 24 hour(s)).  Exercise Prescription Changes - 10/20/19 1200      Response to Exercise   Blood Pressure (Admit)  114/60    Blood Pressure (Exercise)  134/66    Blood Pressure (Exit)  104/60    Heart Rate (Admit)  100 bpm    Heart Rate (Exercise)  122 bpm    Heart Rate (Exit)  103 bpm    Oxygen Saturation (Admit)  98 %    Oxygen Saturation (Exercise)  94 %    Oxygen Saturation (Exit)  96 %    Rating of Perceived Exertion (Exercise)  14    Perceived Dyspnea (Exercise)  3    Duration  Continue with 30 min of aerobic exercise without signs/symptoms of physical distress.    Intensity  THRR unchanged      Progression   Progression  Continue to progress workloads to maintain intensity without signs/symptoms of physical distress.      Resistance Training   Training Prescription  Yes    Weight  blue bands     Reps  10-15    Time  10 Minutes      Recumbant Bike   Level  2.5    Minutes  15      NuStep   Level  5    SPM  80    Minutes  15    METs  3.5       Social History   Tobacco Use  Smoking Status Former Smoker  . Quit date: 06/09/2005  . Years since quitting: 14.3  Smokeless Tobacco Never Used    Goals Met:  Exercise tolerated well No report of cardiac concerns or symptoms Strength training completed today  Goals Unmet:  Not Applicable  Comments: Service time is from 1015 to 1117    Dr. Fransico Him is Medical Director for Cardiac Rehab at [facility].

## 2019-10-22 ENCOUNTER — Other Ambulatory Visit: Payer: Self-pay

## 2019-10-22 ENCOUNTER — Encounter (HOSPITAL_COMMUNITY)
Admission: RE | Admit: 2019-10-22 | Discharge: 2019-10-22 | Disposition: A | Discharge status: Home / Self Care | Payer: No Typology Code available for payment source | Source: Ambulatory Visit | Attending: Pulmonary Disease | Admitting: Pulmonary Disease

## 2019-10-22 DIAGNOSIS — J449 Chronic obstructive pulmonary disease, unspecified: Secondary | ICD-10-CM | POA: Diagnosis not present

## 2019-10-22 NOTE — Progress Notes (Signed)
Daily Session Note  Patient Details  Name: Lee [name] MRN: [id number] Date of Birth: 05-06-1946 Referring Provider:     Pulmonary Rehab Walk Test from 09/21/2019 in [place]  Referring Provider  Dr. Radford Pax      Encounter Date: 10/22/2019  Check In: Session Check In - 10/22/19 1148      Check-In   Supervising physician immediately available to respond to emergencies  [facility] immediately available    Physician(s)  Dr. Marylyn Ishihara    Location  [facility]    Staff Present  Rosebud Poles, RN, Bjorn Loser, MS, Exercise Physiologist    Virtual Visit  No    Medication changes reported      No    Fall or balance concerns reported     No    Tobacco Cessation  No Change    Warm-up and Cool-down  Performed as group-led instruction    Resistance Training Performed  Yes    VAD Patient?  No    PAD/SET Patient?  No      Pain Assessment   Currently in Pain?  No/denies    Multiple Pain Sites  No       Capillary Blood Glucose: No results found for this or any previous visit (from the past 24 hour(s)).    Social History   Tobacco Use  Smoking Status Former Smoker  . Quit date: 06/09/2005  . Years since quitting: 14.3  Smokeless Tobacco Never Used    Goals Met:  Proper associated with RPD/PD & O2 Sat Exercise tolerated well Strength training completed today  Goals Unmet:  Not Applicable  Comments: Service time is from 1015 to 1107    Dr. Fransico Him is Medical Director for Cardiac Rehab at [facility].

## 2019-10-27 ENCOUNTER — Encounter (HOSPITAL_COMMUNITY)
Admission: RE | Admit: 2019-10-27 | Discharge: 2019-10-27 | Disposition: A | Discharge status: Home / Self Care | Payer: No Typology Code available for payment source | Source: Ambulatory Visit | Attending: Pulmonary Disease | Admitting: Pulmonary Disease

## 2019-10-27 ENCOUNTER — Other Ambulatory Visit: Payer: Self-pay

## 2019-10-27 DIAGNOSIS — J449 Chronic obstructive pulmonary disease, unspecified: Secondary | ICD-10-CM

## 2019-10-27 NOTE — Progress Notes (Signed)
Daily Session Note  Patient Details  Name: Lee [name] MRN: [id number] Date of Birth: 1945/11/02 Referring Provider:     Pulmonary Rehab Walk Test from 09/21/2019 in [place]  Referring Provider  Dr. Radford Pax      Encounter Date: 10/27/2019  Check In: Session Check In - 10/27/19 1122      Check-In   Supervising physician immediately available to respond to emergencies  [facility] immediately available    Physician(s)  Dr. Sharlet Salina    Location  [facility]    Staff Present  Rosebud Poles, RN, Bjorn Loser, MS, Exercise Physiologist;Anice Wilshire [name], RN    Virtual Visit  No    Medication changes reported      No    Fall or balance concerns reported     No    Tobacco Cessation  No Change    Warm-up and Cool-down  Performed as group-led instruction    Resistance Training Performed  Yes    VAD Patient?  No    PAD/SET Patient?  No      Pain Assessment   Currently in Pain?  No/denies    Multiple Pain Sites  No       Capillary Blood Glucose: No results found for this or any previous visit (from the past 24 hour(s)).    Social History   Tobacco Use  Smoking Status Former Smoker  . Quit date: 06/09/2005  . Years since quitting: 14.3  Smokeless Tobacco Never Used    Goals Met:  Exercise tolerated well No report of cardiac concerns or symptoms Strength training completed today  Goals Unmet:  Not Applicable  Comments: Service time is from 1010 to 83    Dr. Fransico Him is Medical Director for Cardiac Rehab at [facility].

## 2019-10-29 ENCOUNTER — Other Ambulatory Visit: Payer: Self-pay

## 2019-10-29 ENCOUNTER — Encounter (HOSPITAL_COMMUNITY)
Admission: RE | Admit: 2019-10-29 | Discharge: 2019-10-29 | Disposition: A | Discharge status: Home / Self Care | Payer: No Typology Code available for payment source | Source: Ambulatory Visit | Attending: Pulmonary Disease | Admitting: Pulmonary Disease

## 2019-10-29 DIAGNOSIS — J449 Chronic obstructive pulmonary disease, unspecified: Secondary | ICD-10-CM | POA: Insufficient documentation

## 2019-10-29 NOTE — Progress Notes (Signed)
Daily Session Note  Patient Details  Name: Lee [name] MRN: [id number] Date of Birth: 03/09/1946 Referring Provider:     Pulmonary Rehab Walk Test from 09/21/2019 in [facility]  Referring Provider  Dr. Turner      Encounter Date: 10/29/2019  Check In: Session Check In - 10/29/19 1106      Check-In   Supervising physician immediately available to respond to emergencies  [facility] immediately available    Physician(s)  Dr. Danford    Location  [facility]    Staff Present  Joan Behrens, RN, BSN;Dalton Fletcher, MS, Exercise Physiologist;Lisa Hughes, RN    Virtual Visit  No    Medication changes reported      No    Fall or balance concerns reported     No    Tobacco Cessation  No Change    Warm-up and Cool-down  Performed as group-led instruction    Resistance Training Performed  Yes    VAD Patient?  No    PAD/SET Patient?  No      Pain Assessment   Currently in Pain?  No/denies    Multiple Pain Sites  No       Capillary Blood Glucose: No results found for this or any previous visit (from the past 24 hour(s)).    Social History   Tobacco Use  Smoking Status Former Smoker  . Quit date: 06/09/2005  . Years since quitting: 14.3  Smokeless Tobacco Never Used    Goals Met:  Proper associated with RPD/PD & O2 Sat Exercise tolerated well Strength training completed today  Goals Unmet:  Not Applicable  Comments: Service time is from 1010 to 1105    Dr. Traci Turner is Medical Director for Cardiac Rehab at [facility]. 

## 2019-11-03 ENCOUNTER — Encounter (HOSPITAL_COMMUNITY)
Admission: RE | Admit: 2019-11-03 | Discharge: 2019-11-03 | Disposition: A | Discharge status: Home / Self Care | Payer: No Typology Code available for payment source | Source: Ambulatory Visit | Attending: Pulmonary Disease | Admitting: Pulmonary Disease

## 2019-11-03 ENCOUNTER — Other Ambulatory Visit: Payer: Self-pay

## 2019-11-03 VITALS — Wt 161.2 lb

## 2019-11-03 DIAGNOSIS — J449 Chronic obstructive pulmonary disease, unspecified: Secondary | ICD-10-CM

## 2019-11-03 NOTE — Progress Notes (Signed)
Daily Session Note  Patient Details  Name: Lee [name] MRN: [id number] Date of Birth: 09-24-1945 Referring Provider:     Pulmonary Rehab Walk Test from 09/21/2019 in [place]  Referring Provider  Dr. Radford Pax      Encounter Date: 11/03/2019  Check In: Session Check In - 11/03/19 1118      Check-In   Supervising physician immediately available to respond to emergencies  [facility] immediately available    Physician(s)  Dr. Broadus John    Location  [facility]    Staff Present  Rosebud Poles, RN, Bjorn Loser, MS, Exercise Physiologist;Martez Weiand [name], RN    Virtual Visit  No    Medication changes reported      No    Fall or balance concerns reported     No    Tobacco Cessation  No Change    Warm-up and Cool-down  Performed as group-led instruction    Resistance Training Performed  Yes    VAD Patient?  No    PAD/SET Patient?  No      Pain Assessment   Currently in Pain?  No/denies    Multiple Pain Sites  No       Capillary Blood Glucose: No results found for this or any previous visit (from the past 24 hour(s)).  Exercise Prescription Changes - 11/03/19 1100      Response to Exercise   Blood Pressure (Admit)  112/60    Blood Pressure (Exercise)  122/60    Blood Pressure (Exit)  106/64    Heart Rate (Admit)  100 bpm    Heart Rate (Exercise)  112 bpm    Heart Rate (Exit)  99 bpm    Oxygen Saturation (Admit)  97 %    Oxygen Saturation (Exercise)  95 %    Oxygen Saturation (Exit)  95 %    Rating of Perceived Exertion (Exercise)  13    Perceived Dyspnea (Exercise)  2    Duration  Continue with 30 min of aerobic exercise without signs/symptoms of physical distress.    Intensity  THRR unchanged      Progression   Progression  Continue to progress workloads to maintain intensity without signs/symptoms of physical distress.      Resistance Training   Training Prescription  Yes    Weight  blue bands    Reps   10-15    Time  10 Minutes      Recumbant Bike   Level  2.5    Minutes  15    METs  2.2      NuStep   Level  5    SPM  80    Minutes  15    METs  3.3       Social History   Tobacco Use  Smoking Status Former Smoker  . Quit date: 06/09/2005  . Years since quitting: 14.4  Smokeless Tobacco Never Used    Goals Met:  Exercise tolerated well No report of cardiac concerns or symptoms Strength training completed today  Goals Unmet:  Not Applicable  Comments: Service time is from 1007 to 106/64    Dr. Fransico Him is Medical Director for Cardiac Rehab at [facility].

## 2019-11-05 ENCOUNTER — Other Ambulatory Visit: Payer: Self-pay

## 2019-11-05 ENCOUNTER — Encounter (HOSPITAL_COMMUNITY)
Admission: RE | Admit: 2019-11-05 | Discharge: 2019-11-05 | Disposition: A | Discharge status: Home / Self Care | Payer: No Typology Code available for payment source | Source: Ambulatory Visit | Attending: Pulmonary Disease | Admitting: Pulmonary Disease

## 2019-11-05 DIAGNOSIS — J449 Chronic obstructive pulmonary disease, unspecified: Secondary | ICD-10-CM

## 2019-11-05 NOTE — Progress Notes (Signed)
Daily Session Note  Patient Details  Name: Lee [name] MRN: [id number] Date of Birth: Jun 04, 1946 Referring Provider:     Pulmonary Rehab Walk Test from 09/21/2019 in [place]  Referring Provider  Dr. Radford Pax      Encounter Date: 11/05/2019  Check In: Session Check In - 11/05/19 1139      Check-In   Supervising physician immediately available to respond to emergencies  [facility] immediately available    Physician(s)  Dr. Broadus John    Location  [facility]    Staff Present  Rosebud Poles, RN, Bjorn Loser, MS, Exercise Physiologist;Robin Petrakis [name], RN    Virtual Visit  No    Tobacco Cessation  No Change    Warm-up and Cool-down  Performed on first and last piece of equipment    Resistance Training Performed  Yes    VAD Patient?  No    PAD/SET Patient?  No      Pain Assessment   Currently in Pain?  No/denies    Multiple Pain Sites  No       Capillary Blood Glucose: No results found for this or any previous visit (from the past 24 hour(s)).    Social History   Tobacco Use  Smoking Status Former Smoker  . Quit date: 06/09/2005  . Years since quitting: 14.4  Smokeless Tobacco Never Used    Goals Met:  Exercise tolerated well No report of cardiac concerns or symptoms Strength training completed today  Goals Unmet:  Not Applicable  Comments: Service time is from 1000 to 1100    Dr. Fransico Him is Medical Director for Cardiac Rehab at [facility].

## 2019-11-10 ENCOUNTER — Other Ambulatory Visit: Payer: Self-pay

## 2019-11-10 ENCOUNTER — Encounter (HOSPITAL_COMMUNITY)
Admission: RE | Admit: 2019-11-10 | Discharge: 2019-11-10 | Disposition: A | Discharge status: Home / Self Care | Payer: No Typology Code available for payment source | Source: Ambulatory Visit | Attending: Pulmonary Disease | Admitting: Pulmonary Disease

## 2019-11-10 DIAGNOSIS — J449 Chronic obstructive pulmonary disease, unspecified: Secondary | ICD-10-CM | POA: Diagnosis not present

## 2019-11-10 NOTE — Progress Notes (Signed)
Daily Session Note  Patient Details  Name: Lee [name] MRN: [id number] Date of Birth: 04-Oct-1945 Referring Provider:     Pulmonary Rehab Walk Test from 09/21/2019 in [place]  Referring Provider  Dr. Radford Pax      Encounter Date: 11/10/2019  Check In: Session Check In - 11/10/19 1005      Check-In   Supervising physician immediately available to respond to emergencies  [facility] immediately available    Location  [facility]    Staff Present  Rosebud Poles, RN, Bjorn Loser, MS, Exercise Physiologist;Jenniger Figiel [name], RN    Virtual Visit  No    Medication changes reported      No    Fall or balance concerns reported     No    Tobacco Cessation  No Change    Warm-up and Cool-down  Performed on first and last piece of equipment    Resistance Training Performed  Yes    VAD Patient?  No    PAD/SET Patient?  No      Pain Assessment   Currently in Pain?  No/denies    Multiple Pain Sites  No       Capillary Blood Glucose: No results found for this or any previous visit (from the past 24 hour(s)).    Social History   Tobacco Use  Smoking Status Former Smoker  . Quit date: 06/09/2005  . Years since quitting: 14.4  Smokeless Tobacco Never Used    Goals Met:  No report of cardiac concerns or symptoms Strength training completed today  Goals Unmet:  Not Applicable  Comments: Service time is from 1008 to 77    Dr. Fransico Him is Medical Director for Cardiac Rehab at [facility].

## 2019-11-12 ENCOUNTER — Other Ambulatory Visit: Payer: Self-pay

## 2019-11-12 ENCOUNTER — Encounter (HOSPITAL_COMMUNITY)
Admission: RE | Admit: 2019-11-12 | Discharge: 2019-11-12 | Disposition: A | Discharge status: Home / Self Care | Payer: No Typology Code available for payment source | Source: Ambulatory Visit | Attending: Pulmonary Disease | Admitting: Pulmonary Disease

## 2019-11-12 DIAGNOSIS — J449 Chronic obstructive pulmonary disease, unspecified: Secondary | ICD-10-CM

## 2019-11-12 NOTE — Progress Notes (Signed)
Daily Session Note  Patient Details  Name: Lee [name] MRN: [id number] Date of Birth: 1946/01/17 Referring Provider:     Pulmonary Rehab Walk Test from 09/21/2019 in [place]  Referring Provider  Dr. Radford Pax      Encounter Date: 11/12/2019  Check In: Session Check In - 11/12/19 1132      Check-In   Supervising physician immediately available to respond to emergencies  [facility] immediately available    Physician(s)  Dr. Erlinda Hong    Location  [facility]    Staff Present  Rosebud Poles, RN, Bjorn Loser, MS, Exercise Physiologist;Lisa [name], RN    Virtual Visit  No    Medication changes reported      No    Fall or balance concerns reported     No    Tobacco Cessation  No Change    Warm-up and Cool-down  Performed as group-led instruction    Resistance Training Performed  Yes    VAD Patient?  No    PAD/SET Patient?  No      Pain Assessment   Currently in Pain?  No/denies    Multiple Pain Sites  No       Capillary Blood Glucose: No results found for this or any previous visit (from the past 24 hour(s)).    Social History   Tobacco Use  Smoking Status Former Smoker  . Quit date: 06/09/2005  . Years since quitting: 14.4  Smokeless Tobacco Never Used    Goals Met:  Proper associated with RPD/PD & O2 Sat Exercise tolerated well Strength training completed today  Goals Unmet:  Not Applicable  Comments: Service time is from 1013 to 1112    Dr. Fransico Him is Medical Director for Cardiac Rehab at [facility].

## 2019-11-17 ENCOUNTER — Encounter (HOSPITAL_COMMUNITY)
Admission: RE | Admit: 2019-11-17 | Discharge: 2019-11-17 | Disposition: A | Discharge status: Home / Self Care | Payer: No Typology Code available for payment source | Source: Ambulatory Visit | Attending: Pulmonary Disease | Admitting: Pulmonary Disease

## 2019-11-17 ENCOUNTER — Other Ambulatory Visit: Payer: Self-pay

## 2019-11-17 VITALS — Wt 162.3 lb

## 2019-11-17 DIAGNOSIS — J449 Chronic obstructive pulmonary disease, unspecified: Secondary | ICD-10-CM | POA: Diagnosis not present

## 2019-11-17 NOTE — Progress Notes (Signed)
Daily Session Note  Patient Details  Name: Lee [name] MRN: [id number] Date of Birth: [birth year] Referring Provider:     Pulmonary Rehab Walk Test from 09/21/2019 in [place]  Referring Provider  Dr. Radford Pax      Encounter Date: 11/17/2019  Check In: Session Check In - 11/17/19 1025      Check-In   Supervising physician immediately available to respond to emergencies  [facility] immediately available    Physician(s)  Dr. Leo Rod    Location  [facility]    Staff Present  Rosebud Poles, RN, Bjorn Loser, MS, Exercise Physiologist;Bralyn Folkert [name], RN    Virtual Visit  No    Medication changes reported      No    Fall or balance concerns reported     No    Tobacco Cessation  No Change    Warm-up and Cool-down  Performed as group-led instruction    Resistance Training Performed  Yes    VAD Patient?  No    PAD/SET Patient?  No      Pain Assessment   Currently in Pain?  No/denies    Multiple Pain Sites  No       Capillary Blood Glucose: No results found for this or any previous visit (from the past 24 hour(s)).  Exercise Prescription Changes - 11/17/19 1100      Response to Exercise   Blood Pressure (Admit)  120/62    Blood Pressure (Exercise)  124/64    Blood Pressure (Exit)  98/86    Heart Rate (Admit)  87 bpm    Heart Rate (Exercise)  104 bpm    Heart Rate (Exit)  81 bpm    Oxygen Saturation (Admit)  97 %    Oxygen Saturation (Exercise)  98 %    Oxygen Saturation (Exit)  98 %    Rating of Perceived Exertion (Exercise)  12    Perceived Dyspnea (Exercise)  3    Duration  Continue with 30 min of aerobic exercise without signs/symptoms of physical distress.    Intensity  THRR New      Progression   Progression  Continue to progress workloads to maintain intensity without signs/symptoms of physical distress.      Resistance Training   Training Prescription  Yes    Weight  blue bands    Reps  10-15    Time  10 Minutes      Recumbant Bike   Level  2.5    Minutes  15    METs  2.1      NuStep   Level  5    SPM  80    Minutes  15    METs  2.7       Social History   Tobacco Use  Smoking Status Former Smoker  . Quit date: 06/09/2005  . Years since quitting: 14.4  Smokeless Tobacco Never Used    Goals Met:  Exercise tolerated well No report of cardiac concerns or symptoms Strength training completed today  Goals Unmet:  Not Applicable  Comments: Service time is from 1005 to 1100    Dr. Fransico Him is Medical Director for Cardiac Rehab at [facility].

## 2019-11-18 NOTE — Progress Notes (Signed)
Pulmonary Individual Treatment Plan  Patient Details  Name: Lee [name] MRN: [id number] Date of Birth: 1946/04/01 Referring Provider:     Pulmonary Rehab Walk Test from 09/21/2019 in [facility]  Referring Provider  Dr. Radford Pax      Initial Encounter Date:    Pulmonary Rehab Walk Test from 09/21/2019 in [facility]  Date  09/21/19      Visit Diagnosis: Chronic obstructive pulmonary disease, unspecified COPD type ([place])  Patient's Home Medications on Admission:   Current Outpatient Medications:  .  albuterol (PROVENTIL HFA;VENTOLIN HFA) 108 (90 Base) MCG/ACT inhaler, Inhale 2 puffs into the lungs every 4 (four) hours as needed for wheezing or shortness of breath., Disp: 1 Inhaler, Rfl: 0 .  budesonide-formoterol (SYMBICORT) 80-4.5 MCG/ACT inhaler, Inhale 2 puffs into the lungs 2 (two) times daily., Disp: , Rfl:  .  Ergocalciferol (VITAMIN D2) 50 MCG (2000 UT) TABS, Take by mouth., Disp: , Rfl:  .  folic acid (FOLVITE) 1 MG tablet, Take 1 mg by mouth daily., Disp: , Rfl:  .  Multiple Vitamin (QUINTABS) TABS, Take by mouth., Disp: , Rfl:  .  oxybutynin (DITROPAN-XL) 10 MG 24 hr tablet, Take 10 mg by mouth at bedtime., Disp: , Rfl:  .  pantoprazole (PROTONIX) 40 MG tablet, Take by mouth., Disp: , Rfl:  .  tamsulosin (FLOMAX) 0.4 MG CAPS capsule, Take 0.4 mg by mouth., Disp: , Rfl:  .  Tiotropium Bromide Monohydrate (SPIRIVA RESPIMAT) 2.5 MCG/ACT AERS, Inhale 1 puff into the lungs daily., Disp: , Rfl:  .  Turmeric POWD, Take 5 mLs by mouth daily., Disp: , Rfl:   Past Medical History: Past Medical History:  Diagnosis Date  . Acid reflux   . Alcohol abuse    quit 10 y ago  . Arthritis   . Cervical spondylosis without myelopathy 06/02/2015  . Emphysema lung ([place])   . High cholesterol   . Hypertension   . Tremor 06/02/2015    Tobacco Use: Social History   Tobacco Use  Smoking Status Former Smoker  . Quit date:  06/09/2005  . Years since quitting: 14.4  Smokeless Tobacco Never Used    Labs: Recent Review [facility] for [facility] Latest Ref Rng & Units 01/05/2017   PHART 7.350 - 7.450 7.403   PCO2ART 32.0 - 48.0 mmHg 39.7   HCO3 20.0 - 28.0 mmol/L 24.4   O2SAT % 92.6      Capillary Blood Glucose: No results found for: GLUCAP   Pulmonary Assessment Scores: Pulmonary Assessment Scores    Row Name 09/21/19 1126 09/21/19 1129       ADL UCSD   ADL Phase  Entry  Entry    SOB Score total  75  --      CAT Score   CAT Score  23  --      mMRC Score   mMRC Score  --  3      UCSD: Self-administered rating of dyspnea associated with activities of daily living (ADLs) 6-point scale (0 = "not at all" to 5 = "maximal or unable to do because of breathlessness")  Scoring Scores range from 0 to 120.  Minimally important difference is 5 units  CAT: CAT can identify the health impairment of COPD patients and is better correlated with disease progression.  CAT has a scoring range of zero to 40. The CAT score is classified into four groups of  low (less than 10), medium (10 - 20), high (21-30) and very high (31-40) based on the impact level of disease on health status. A CAT score over 10 suggests significant symptoms.  A worsening CAT score could be explained by an exacerbation, poor medication adherence, poor inhaler technique, or progression of COPD or comorbid conditions.  CAT MCID is 2 points  mMRC: mMRC (Modified Medical Research Council) Dyspnea Scale is used to assess the degree of baseline functional disability in patients of respiratory disease due to dyspnea. No minimal important difference is established. A decrease in score of 1 point or greater is considered a positive change.   Pulmonary Function Assessment: Pulmonary Function Assessment - 09/21/19 0942      Breath   Bilateral Breath Sounds  Clear    Shortness of Breath  Yes;Limiting activity        Exercise Target Goals: Exercise Program Goal: Individual exercise prescription set using results from initial 6 min walk test and THRR while considering  patient's activity barriers and safety.   Exercise Prescription Goal: Initial exercise prescription builds to 30-45 minutes a day of aerobic activity, 2-3 days per week.  Home exercise guidelines will be given to patient during program as part of exercise prescription that the participant will acknowledge.  Activity Barriers & Risk Stratification: Activity Barriers & Cardiac Risk Stratification - 09/21/19 0934      Activity Barriers & Cardiac Risk Stratification   Activity Barriers  Arthritis;Shortness of Breath;Balance Concerns;History of Falls       6 Minute Walk: 6 Minute Walk    Row Name 09/21/19 1129         6 Minute Walk   Phase  Initial     Distance  1367 feet     Walk Time  6 minutes     # of Rest Breaks  0     MPH  2.59     METS  3.21     RPE  12     Perceived Dyspnea   2     VO2 Peak  11.23     Symptoms  No     Resting HR  81 bpm     Resting BP  106/64     Resting Oxygen Saturation   98 %     Exercise Oxygen Saturation  during 6 min walk  90 %     Max Ex. HR  111 bpm     Max Ex. BP  110/64     2 Minute Post BP  106/68       Interval HR   1 Minute HR  107     2 Minute HR  107     3 Minute HR  108     4 Minute HR  111     5 Minute HR  107     6 Minute HR  107     2 Minute Post HR  90     Interval Heart Rate?  Yes       Interval Oxygen   Interval Oxygen?  Yes     Baseline Oxygen Saturation %  98 %     1 Minute Oxygen Saturation %  97 %     1 Minute Liters of Oxygen  0 L     2 Minute Oxygen Saturation %  91 %     2 Minute Liters of Oxygen  0 L     3 Minute Oxygen Saturation %  93 %  3 Minute Liters of Oxygen  0 L     4 Minute Oxygen Saturation %  92 %     4 Minute Liters of Oxygen  0 L     5 Minute Oxygen Saturation %  95 %     5 Minute Liters of Oxygen  0 L     6 Minute Oxygen Saturation  %  90 %     6 Minute Liters of Oxygen  0 L     2 Minute Post Oxygen Saturation %  97 %     2 Minute Post Liters of Oxygen  0 L        Oxygen Initial Assessment: Oxygen Initial Assessment - 09/21/19 1128      Home Oxygen   Home Oxygen Device  None    Sleep Oxygen Prescription  None    Home Exercise Oxygen Prescription  None    Home at Rest Exercise Oxygen Prescription  None    Compliance with Home Oxygen Use  Yes      Initial 6 min Walk   Oxygen Used  None      Program Oxygen Prescription   Program Oxygen Prescription  None      Intervention   Short Term Goals  To learn and exhibit compliance with exercise, home and travel O2 prescription;To learn and understand importance of monitoring SPO2 with pulse oximeter and demonstrate accurate use of the pulse oximeter.;To learn and understand importance of maintaining oxygen saturations>88%;To learn and demonstrate proper pursed lip breathing techniques or other breathing techniques.;To learn and demonstrate proper use of respiratory medications    Long  Term Goals  Demonstrates proper use of MDI's;Compliance with respiratory medication;Exhibits proper breathing techniques, such as pursed lip breathing or other method taught during program session;Maintenance of O2 saturations>88%;Verbalizes importance of monitoring SPO2 with pulse oximeter and return demonstration;Exhibits compliance with exercise, home and travel O2 prescription       Oxygen Re-Evaluation: Oxygen Re-Evaluation    Row Name 10/20/19 0704 11/17/19 0730           Program Oxygen Prescription   Program Oxygen Prescription  None  None        Home Oxygen   Home Oxygen Device  None  None      Sleep Oxygen Prescription  None  None      Home Exercise Oxygen Prescription  None  None      Home at Rest Exercise Oxygen Prescription  None  None      Compliance with Home Oxygen Use  Yes  Yes        Goals/Expected Outcomes   Short Term Goals  To learn and exhibit compliance  with exercise, home and travel O2 prescription;To learn and understand importance of monitoring SPO2 with pulse oximeter and demonstrate accurate use of the pulse oximeter.;To learn and understand importance of maintaining oxygen saturations>88%;To learn and demonstrate proper pursed lip breathing techniques or other breathing techniques.;To learn and demonstrate proper use of respiratory medications  To learn and exhibit compliance with exercise, home and travel O2 prescription;To learn and understand importance of monitoring SPO2 with pulse oximeter and demonstrate accurate use of the pulse oximeter.;To learn and understand importance of maintaining oxygen saturations>88%;To learn and demonstrate proper pursed lip breathing techniques or other breathing techniques.;To learn and demonstrate proper use of respiratory medications      Long  Term Goals  Demonstrates proper use of MDI's;Compliance with respiratory medication;Exhibits proper breathing techniques, such as pursed lip breathing or  other method taught during program session;Maintenance of O2 saturations>88%;Verbalizes importance of monitoring SPO2 with pulse oximeter and return demonstration;Exhibits compliance with exercise, home and travel O2 prescription  Demonstrates proper use of MDI's;Compliance with respiratory medication;Exhibits proper breathing techniques, such as pursed lip breathing or other method taught during program session;Maintenance of O2 saturations>88%;Verbalizes importance of monitoring SPO2 with pulse oximeter and return demonstration;Exhibits compliance with exercise, home and travel O2 prescription      Goals/Expected Outcomes  compliance  compliance         Oxygen Discharge (Final Oxygen Re-Evaluation): Oxygen Re-Evaluation - 11/17/19 0730      Program Oxygen Prescription   Program Oxygen Prescription  None      Home Oxygen   Home Oxygen Device  None    Sleep Oxygen Prescription  None    Home Exercise Oxygen  Prescription  None    Home at Rest Exercise Oxygen Prescription  None    Compliance with Home Oxygen Use  Yes      Goals/Expected Outcomes   Short Term Goals  To learn and exhibit compliance with exercise, home and travel O2 prescription;To learn and understand importance of monitoring SPO2 with pulse oximeter and demonstrate accurate use of the pulse oximeter.;To learn and understand importance of maintaining oxygen saturations>88%;To learn and demonstrate proper pursed lip breathing techniques or other breathing techniques.;To learn and demonstrate proper use of respiratory medications    Long  Term Goals  Demonstrates proper use of MDI's;Compliance with respiratory medication;Exhibits proper breathing techniques, such as pursed lip breathing or other method taught during program session;Maintenance of O2 saturations>88%;Verbalizes importance of monitoring SPO2 with pulse oximeter and return demonstration;Exhibits compliance with exercise, home and travel O2 prescription    Goals/Expected Outcomes  compliance       Initial Exercise Prescription: Initial Exercise Prescription - 09/21/19 1100      Date of Initial Exercise RX and Referring Provider   Date  09/21/19    Referring Provider  Dr. Radford Pax      Recumbant Bike   Level  2    Minutes  15      NuStep   Level  3    SPM  80    Minutes  15      Prescription Details   Frequency (times per week)  2    Duration  Progress to 30 minutes of continuous aerobic without signs/symptoms of physical distress      Intensity   THRR 40-80% of Max Heartrate  59-118    Ratings of Perceived Exertion  11-13    Perceived Dyspnea  0-4      Progression   Progression  Continue to progress workloads to maintain intensity without signs/symptoms of physical distress.      Resistance Training   Training Prescription  Yes    Weight  blue bands    Reps  10-15       Perform Capillary Blood Glucose checks as needed.  Exercise Prescription  Changes: Exercise Prescription Changes    Row Name 10/06/19 1100 10/13/19 1100 10/20/19 1200 11/03/19 1100 11/17/19 1100     Response to Exercise   Blood Pressure (Admit)  112/54  --  114/60  112/60  120/62   Blood Pressure (Exercise)  124/56  --  134/66  122/60  124/64   Blood Pressure (Exit)  102/54  --  104/60  106/64  98/86   Heart Rate (Admit)  106 bpm  --  100 bpm  100 bpm  87 bpm  Heart Rate (Exercise)  110 bpm  --  122 bpm  112 bpm  104 bpm   Heart Rate (Exit)  103 bpm  --  103 bpm  99 bpm  81 bpm   Oxygen Saturation (Admit)  97 %  --  98 %  97 %  97 %   Oxygen Saturation (Exercise)  96 %  --  94 %  95 %  98 %   Oxygen Saturation (Exit)  97 %  --  96 %  95 %  98 %   Rating of Perceived Exertion (Exercise)  12  --  '14  13  12   '$ Perceived Dyspnea (Exercise)  2  --  '3  2  3   '$ Duration  Continue with 30 min of aerobic exercise without signs/symptoms of physical distress.  --  Continue with 30 min of aerobic exercise without signs/symptoms of physical distress.  Continue with 30 min of aerobic exercise without signs/symptoms of physical distress.  Continue with 30 min of aerobic exercise without signs/symptoms of physical distress.   Intensity  Other (comment) 40-80% of HRR  --  THRR unchanged  THRR unchanged  THRR New     Progression   Progression  Continue to progress workloads to maintain intensity without signs/symptoms of physical distress.  --  Continue to progress workloads to maintain intensity without signs/symptoms of physical distress.  Continue to progress workloads to maintain intensity without signs/symptoms of physical distress.  Continue to progress workloads to maintain intensity without signs/symptoms of physical distress.     Resistance Training   Training Prescription  Yes  --  Yes  Yes  Yes   Weight  blue bands  --  blue bands  blue bands  blue bands   Reps  10-15  --  10-15  10-15  10-15   Time  10 Minutes  --  10 Minutes  10 Minutes  10 Minutes     Recumbant  Bike   Level  2  --  2.5  2.5  2.5   Minutes  15  --  '15  15  15   '$ METs  --  --  --  2.2  2.1     NuStep   Level  4  --  '5  5  5   '$ SPM  80  --  80  80  80   Minutes  15  --  '15  15  15   '$ METs  3  --  3.5  3.3  2.7     Home Exercise Plan   Plans to continue exercise at  --  Home (comment)  --  --  --   Frequency  --  Add 4 additional days to program exercise sessions.  --  --  --   Initial Home Exercises Provided  --  10/13/19  --  --  --      Exercise Comments: Exercise Comments    Row Name 10/13/19 1143           Exercise Comments  home exercise complete          Exercise Goals and Review: Exercise Goals    Row Name 09/21/19 1139 10/20/19 0704 11/17/19 0730         Exercise Goals   Increase Physical Activity  Yes  Yes  Yes     Intervention  Provide advice, education, support and counseling about physical activity/exercise needs.;Develop an individualized exercise prescription for aerobic and resistive training based  on initial evaluation findings, risk stratification, comorbidities and participant's personal goals.  Provide advice, education, support and counseling about physical activity/exercise needs.;Develop an individualized exercise prescription for aerobic and resistive training based on initial evaluation findings, risk stratification, comorbidities and participant's personal goals.  Provide advice, education, support and counseling about physical activity/exercise needs.;Develop an individualized exercise prescription for aerobic and resistive training based on initial evaluation findings, risk stratification, comorbidities and participant's personal goals.     Expected Outcomes  Short Term: Attend rehab on a regular basis to increase amount of physical activity.;Long Term: Add in home exercise to make exercise part of routine and to increase amount of physical activity.;Long Term: Exercising regularly at least 3-5 days a week.  Short Term: Attend rehab on a regular  basis to increase amount of physical activity.;Long Term: Add in home exercise to make exercise part of routine and to increase amount of physical activity.;Long Term: Exercising regularly at least 3-5 days a week.  Short Term: Attend rehab on a regular basis to increase amount of physical activity.;Long Term: Add in home exercise to make exercise part of routine and to increase amount of physical activity.;Long Term: Exercising regularly at least 3-5 days a week.     Increase Strength and Stamina  Yes  Yes  Yes     Intervention  Provide advice, education, support and counseling about physical activity/exercise needs.;Develop an individualized exercise prescription for aerobic and resistive training based on initial evaluation findings, risk stratification, comorbidities and participant's personal goals.  Provide advice, education, support and counseling about physical activity/exercise needs.;Develop an individualized exercise prescription for aerobic and resistive training based on initial evaluation findings, risk stratification, comorbidities and participant's personal goals.  Provide advice, education, support and counseling about physical activity/exercise needs.;Develop an individualized exercise prescription for aerobic and resistive training based on initial evaluation findings, risk stratification, comorbidities and participant's personal goals.     Expected Outcomes  Short Term: Increase workloads from initial exercise prescription for resistance, speed, and METs.;Short Term: Perform resistance training exercises routinely during rehab and add in resistance training at home;Long Term: Improve cardiorespiratory fitness, muscular endurance and strength as measured by increased METs and functional capacity (6MWT)  Short Term: Increase workloads from initial exercise prescription for resistance, speed, and METs.;Short Term: Perform resistance training exercises routinely during rehab and add in resistance  training at home;Long Term: Improve cardiorespiratory fitness, muscular endurance and strength as measured by increased METs and functional capacity (6MWT)  Short Term: Increase workloads from initial exercise prescription for resistance, speed, and METs.;Short Term: Perform resistance training exercises routinely during rehab and add in resistance training at home;Long Term: Improve cardiorespiratory fitness, muscular endurance and strength as measured by increased METs and functional capacity (6MWT)     Able to understand and use rate of perceived exertion (RPE) scale  Yes  Yes  Yes     Intervention  Provide education and explanation on how to use RPE scale  Provide education and explanation on how to use RPE scale  Provide education and explanation on how to use RPE scale     Expected Outcomes  Short Term: Able to use RPE daily in rehab to express subjective intensity level;Long Term:  Able to use RPE to guide intensity level when exercising independently  Short Term: Able to use RPE daily in rehab to express subjective intensity level;Long Term:  Able to use RPE to guide intensity level when exercising independently  Short Term: Able to use RPE daily in rehab to  express subjective intensity level;Long Term:  Able to use RPE to guide intensity level when exercising independently     Able to understand and use Dyspnea scale  Yes  Yes  Yes     Intervention  Provide education and explanation on how to use Dyspnea scale  Provide education and explanation on how to use Dyspnea scale  Provide education and explanation on how to use Dyspnea scale     Expected Outcomes  Short Term: Able to use Dyspnea scale daily in rehab to express subjective sense of shortness of breath during exertion;Long Term: Able to use Dyspnea scale to guide intensity level when exercising independently  Short Term: Able to use Dyspnea scale daily in rehab to express subjective sense of shortness of breath during exertion;Long Term: Able to  use Dyspnea scale to guide intensity level when exercising independently  Short Term: Able to use Dyspnea scale daily in rehab to express subjective sense of shortness of breath during exertion;Long Term: Able to use Dyspnea scale to guide intensity level when exercising independently     Knowledge and understanding of Target Heart Rate Range (THRR)  Yes  Yes  Yes     Intervention  --  --  Provide education and explanation of THRR including how the numbers were predicted and where they are located for reference     Expected Outcomes  Short Term: Able to state/look up THRR;Short Term: Able to use daily as guideline for intensity in rehab;Long Term: Able to use THRR to govern intensity when exercising independently  Short Term: Able to state/look up THRR;Short Term: Able to use daily as guideline for intensity in rehab;Long Term: Able to use THRR to govern intensity when exercising independently  Short Term: Able to state/look up THRR;Short Term: Able to use daily as guideline for intensity in rehab;Long Term: Able to use THRR to govern intensity when exercising independently     Understanding of Exercise Prescription  Yes  Yes  Yes     Intervention  Provide education, explanation, and written materials on patient's individual exercise prescription  Provide education, explanation, and written materials on patient's individual exercise prescription  Provide education, explanation, and written materials on patient's individual exercise prescription     Expected Outcomes  Short Term: Able to explain program exercise prescription;Long Term: Able to explain home exercise prescription to exercise independently  Short Term: Able to explain program exercise prescription;Long Term: Able to explain home exercise prescription to exercise independently  Short Term: Able to explain program exercise prescription;Long Term: Able to explain home exercise prescription to exercise independently        Exercise Goals  Re-Evaluation : Exercise Goals Re-Evaluation    Row Name 10/20/19 0705 11/17/19 0731           Exercise Goal Re-Evaluation   Exercise Goals Review  Increase Physical Activity;Increase Strength and Stamina;Able to understand and use rate of perceived exertion (RPE) scale;Able to understand and use Dyspnea scale;Knowledge and understanding of Target Heart Rate Range (THRR);Understanding of Exercise Prescription  Increase Physical Activity;Increase Strength and Stamina;Able to understand and use rate of perceived exertion (RPE) scale;Able to understand and use Dyspnea scale;Knowledge and understanding of Target Heart Rate Range (THRR);Understanding of Exercise Prescription      Comments  Pt has attended 6 exercise sessions. He is highly motivated and tries to beat his previous MET level every time. He is currently exercising at 3.3 METs on the stepper. Will continue to monitor and progress as able.  Pt  has attended 14 exercise sessions. He is highly motivated, but his progression has leveled off. 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 He currently exercises at 3.3 METs on the stepper. Will continue to monitor and progress as able.      Expected Outcomes  Through exercise at rehab and at home, the patient will decrease shortness of breath with daily activities and feel confident in carrying out an exercise regime at home.  Through exercise at rehab and at home, the patient will decrease shortness of breath with daily activities and feel confident in carrying out an exercise regime at home.         Discharge Exercise Prescription (Final Exercise Prescription Changes): Exercise Prescription Changes - 11/17/19 1100      Response to Exercise   Blood Pressure (Admit)  120/62    Blood Pressure (Exercise)  124/64    Blood Pressure (Exit)  98/86    Heart Rate (Admit)  87 bpm    Heart Rate (Exercise)  104 bpm    Heart Rate (Exit)  81 bpm    Oxygen Saturation (Admit)  97 %     Oxygen Saturation (Exercise)  98 %    Oxygen Saturation (Exit)  98 %    Rating of Perceived Exertion (Exercise)  12    Perceived Dyspnea (Exercise)  3    Duration  Continue with 30 min of aerobic exercise without signs/symptoms of physical distress.    Intensity  THRR New      Progression   Progression  Continue to progress workloads to maintain intensity without signs/symptoms of physical distress.      Resistance Training   Training Prescription  Yes    Weight  blue bands    Reps  10-15    Time  10 Minutes      Recumbant Bike   Level  2.5    Minutes  15    METs  2.1      NuStep   Level  5    SPM  80    Minutes  15    METs  2.7       Nutrition:  Target Goals: Understanding of nutrition guidelines, daily intake of sodium '1500mg'$ , cholesterol '200mg'$ , calories 30% from fat and 7% or less from saturated fats, daily to have 5 or more servings of fruits and vegetables.  Biometrics: Pre Biometrics - 09/21/19 0940      Pre Biometrics   Height  '5\' 11"'$  (1.803 m)    Weight  164 lb 0.4 oz (74.4 kg)    BMI (Calculated)  22.89    Grip Strength  34 kg        Nutrition Therapy Plan and Nutrition Goals: Nutrition Therapy & Goals - 10/08/19 1147      Nutrition Therapy   Diet  High calorie/high protein      Personal Nutrition Goals   Nutrition Goal  Pt to maintain current weight >164 lbs.    Personal Goal #2  Pt to add >300 kcals per day when he skips meals      Intervention Plan   Intervention  Prescribe, educate and counsel regarding individualized specific dietary modifications aiming towards targeted core components such as weight, hypertension, lipid management, diabetes, heart failure and other comorbidities.    Expected Outcomes  Short Term Goal: A plan has been developed with personal nutrition goals set during dietitian appointment.       Nutrition Assessments: Nutrition Assessments - 10/08/19 1147  Rate Your Plate Scores   Pre Score  60       Nutrition  Goals Re-Evaluation: Nutrition Goals Re-Evaluation    [place] Name 10/08/19 1147 11/17/19 0929           Goals   Current Weight  164 lb (74.4 kg)  161 lb (73 kg)      Nutrition Goal  --  Pt to maintain current weight >164 lbs.      Expected Outcome  --  Weight maintenance and adequate nutritional intake        Personal Goal #2 Re-Evaluation   Personal Goal #2  --  Pt to add >300 kcals per day when he skips meals         Nutrition Goals Discharge (Final Nutrition Goals Re-Evaluation): Nutrition Goals Re-Evaluation - 11/17/19 0929      Goals   Current Weight  161 lb (73 kg)    Nutrition Goal  Pt to maintain current weight >164 lbs.    Expected Outcome  Weight maintenance and adequate nutritional intake      Personal Goal #2 Re-Evaluation   Personal Goal #2  Pt to add >300 kcals per day when he skips meals       Psychosocial: Target Goals: Acknowledge presence or absence of significant depression and/or stress, maximize coping skills, provide positive support system. Participant is able to verbalize types and ability to use techniques and skills needed for reducing stress and depression.  Initial Review & Psychosocial Screening: Initial Psych Review & Screening - 09/21/19 0943      Initial Review   Current issues with  None Identified      Family Dynamics   Good Support System?  Yes      Barriers   Psychosocial barriers to participate in program  There are no identifiable barriers or psychosocial needs.      Screening Interventions   Interventions  Encouraged to exercise       Quality of Life Scores:  Scores of 19 and below usually indicate a poorer quality of life in these areas.  A difference of  2-3 points is a clinically meaningful difference.  A difference of 2-3 points in the total score of the Quality of Life Index has been associated with significant improvement in overall quality of life, self-image, physical symptoms, and general health in studies assessing  change in quality of life.  PHQ-9: Recent Review Flowsheet Data    Depression screen [facility] 2/9 09/21/2019 09/21/2019 08/27/2017   Decreased Interest 0 0 0   Down, Depressed, Hopeless 0 0 3   PHQ - 2 Score 0 0 3   Altered sleeping 0 - 1   Tired, decreased energy 0 - 1   Change in appetite 0 - 0   Feeling bad or failure about yourself  0 - 1   Trouble concentrating 0 - 1   Moving slowly or fidgety/restless 0 - 0   Suicidal thoughts 0 - 0   PHQ-9 Score 0 - 7   Difficult doing work/chores Not difficult at all - Somewhat difficult     Interpretation of Total Score  Total Score Depression Severity:  1-4 = Minimal depression, 5-9 = Mild depression, 10-14 = Moderate depression, 15-19 = Moderately severe depression, 20-27 = Severe depression   Psychosocial Evaluation and Intervention: Psychosocial Evaluation - 09/21/19 0944      Psychosocial Evaluation & Interventions   Interventions  Encouraged to exercise with the program and follow exercise prescription  Comments  No psychosocial barriers or concerns identified    Expected Outcomes  Will continue to have no barriers or concerns    Continue Psychosocial Services   No Follow up required       Psychosocial Re-Evaluation: Psychosocial Re-Evaluation    [place] Name 10/19/19 1246 11/16/19 1255           Psychosocial Re-Evaluation   Current issues with  None Identified  None Identified      Comments  no psychosocial concerns are identified  No concerns identified.      Expected Outcomes  Will continue to have no barriers or psychosocial concerns while in pulmonary rehab.  For patient to continue to have no barriers or psychosocial concerns while participating in pulmonary rehab.      Interventions  Encouraged to attend Pulmonary Rehabilitation for the exercise  Encouraged to attend Pulmonary Rehabilitation for the exercise      Continue Psychosocial Services   No Follow up required  No Follow up required         Psychosocial Discharge  (Final Psychosocial Re-Evaluation): Psychosocial Re-Evaluation - 11/16/19 1255      Psychosocial Re-Evaluation   Current issues with  None Identified    Comments  No concerns identified.    Expected Outcomes  For patient to continue to have no barriers or psychosocial concerns while participating in pulmonary rehab.    Interventions  Encouraged to attend Pulmonary Rehabilitation for the exercise    Continue Psychosocial Services   No Follow up required       Education: Education Goals: Education classes will be provided on a weekly basis, covering required topics. Participant will state understanding/return demonstration of topics presented.  Learning Barriers/Preferences: Learning Barriers/Preferences - 09/21/19 0946      Learning Barriers/Preferences   Learning Barriers  None    Learning Preferences  Audio;Computer/Internet;Group Instruction;Pictoral;Skilled Demonstration;Individual Instruction;Verbal Instruction;Video;Written Material       Education Topics: Risk Factor Reduction:  -Group instruction that is supported by a PowerPoint presentation. Instructor discusses the definition of a risk factor, different risk factors for pulmonary disease, and how the heart and lungs work together.     PULMONARY REHAB CHRONIC OBSTRUCTIVE PULMONARY DISEASE from 11/12/2019 in [place]  Date  11/05/19  Educator  Handout      Nutrition for Pulmonary Patient:  -Group instruction provided by PowerPoint slides, verbal discussion, and written materials to support subject matter. The instructor gives an explanation and review of healthy diet recommendations, which includes a discussion on weight management, recommendations for fruit and vegetable consumption, as well as protein, fluid, caffeine, fiber, sodium, sugar, and alcohol. Tips for eating when patients are short of breath are discussed.   PULMONARY REHAB CHRONIC OBSTRUCTIVE PULMONARY DISEASE from 11/12/2019 in  [place]  Date  11/03/19  Educator  Dietician  Instruction Review Code  -- [Handout]      Pursed Lip Breathing:  -Group instruction that is supported by demonstration and informational handouts. Instructor discusses the benefits of pursed lip and diaphragmatic breathing and detailed demonstration on how to preform both.     Oxygen Safety:  -Group instruction provided by PowerPoint, verbal discussion, and written material to support subject matter. There is an overview of "What is Oxygen" and "Why do we need it".  Instructor also reviews how to create a safe environment for oxygen use, the importance of using oxygen as prescribed, and the risks of noncompliance. There is a brief discussion  on traveling with oxygen and resources the patient may utilize.   Oxygen Equipment:  -Group instruction provided by [facility] Staff utilizing handouts, written materials, and equipment demonstrations.   Signs and Symptoms:  -Group instruction provided by written material and verbal discussion to support subject matter. Warning signs and symptoms of infection, stroke, and heart attack are reviewed and when to call the physician/911 reinforced. Tips for preventing the spread of infection discussed.   Advanced Directives:  -Group instruction provided by verbal instruction and written material to support subject matter. Instructor reviews Advanced Directive laws and proper instruction for filling out document.   Pulmonary Video:  -Group video education that reviews the importance of medication and oxygen compliance, exercise, good nutrition, pulmonary hygiene, and pursed lip and diaphragmatic breathing for the pulmonary patient.   Exercise for the Pulmonary Patient:  -Group instruction that is supported by a PowerPoint presentation. Instructor discusses benefits of exercise, core components of exercise, frequency, duration, and intensity of an exercise routine,  importance of utilizing pulse oximetry during exercise, safety while exercising, and options of places to exercise outside of rehab.     Pulmonary Medications:  -Verbally interactive group education provided by instructor with focus on inhaled medications and proper administration.   Anatomy and Physiology of the Respiratory System and Intimacy:  -Group instruction provided by PowerPoint, verbal discussion, and written material to support subject matter. Instructor reviews respiratory cycle and anatomical components of the respiratory system and their functions. Instructor also reviews differences in obstructive and restrictive respiratory diseases with examples of each. Intimacy, Sex, and Sexuality differences are reviewed with a discussion on how relationships can change when diagnosed with pulmonary disease. Common sexual concerns are reviewed.   PULMONARY REHAB CHRONIC OBSTRUCTIVE PULMONARY DISEASE from 11/12/2019 in [place]  Date  10/08/19  Educator  Handout      MD DAY -A group question and answer session with a medical doctor that allows participants to ask questions that relate to their pulmonary disease state.   OTHER EDUCATION -Group or individual verbal, written, or video instructions that support the educational goals of the pulmonary rehab program.   PULMONARY REHAB CHRONIC OBSTRUCTIVE PULMONARY DISEASE from 11/12/2019 in [place]  Date  11/12/19  Educator  -- Juanita Craver Level H/O]  Instruction Review Code  -- [na]      Holiday Eating Survival Tips:  -Group instruction provided by PowerPoint slides, verbal discussion, and written materials to support subject matter. The instructor gives patients tips, tricks, and techniques to help them not only survive but enjoy the holidays despite the onslaught of food that accompanies the holidays.   Knowledge Questionnaire Score: Knowledge Questionnaire Score - 09/21/19  1127      Knowledge Questionnaire Score   Pre Score  8/18       Core Components/Risk Factors/Patient Goals at Admission: Personal Goals and Risk Factors at Admission - 09/21/19 0946      Core Components/Risk Factors/Patient Goals on Admission   Improve shortness of breath with ADL's  Yes    Intervention  Provide education, individualized exercise plan and daily activity instruction to help decrease symptoms of SOB with activities of daily living.    Expected Outcomes  Short Term: Improve cardiorespiratory fitness to achieve a reduction of symptoms when performing ADLs;Long Term: Be able to perform more ADLs without symptoms or delay the onset of symptoms       Core Components/Risk Factors/Patient Goals Review:  Goals and Risk  Factor Review    [place] Name 09/21/19 0601 10/19/19 1247 11/16/19 1256         Core Components/Risk Factors/Patient Goals Review   Personal Goals Review  Develop more efficient breathing techniques such as purse lipped breathing and diaphragmatic breathing and practicing self-pacing with activity.;Increase knowledge of respiratory medications and ability to use respiratory devices properly.;Improve shortness of breath with ADL's  Develop more efficient breathing techniques such as purse lipped breathing and diaphragmatic breathing and practicing self-pacing with activity.;Increase knowledge of respiratory medications and ability to use respiratory devices properly.;Improve shortness of breath with ADL's  Develop more efficient breathing techniques such as purse lipped breathing and diaphragmatic breathing and practicing self-pacing with activity.;Increase knowledge of respiratory medications and ability to use respiratory devices properly.;Improve shortness of breath with ADL's     Review  --  He is a joy to have in the program, always positive, up to level 4 on nustep and level 2.5 on the recumbent bike, has attended 6 exercise sessions.  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     Expected Outcomes  --  See admission goals  See admission goals.        Core Components/Risk Factors/Patient Goals at Discharge (Final Review):  Goals and Risk Factor Review - 11/16/19 1256      Core Components/Risk Factors/Patient Goals Review   Personal Goals Review  Develop more efficient breathing techniques such as purse lipped breathing and diaphragmatic breathing and practicing self-pacing with activity.;Increase knowledge of respiratory medications and ability to use respiratory devices properly.;Improve shortness of breath with ADL's    Review  [name] is a hard worker, ex-[employer] and gives it his all, is exercising on level 5 of the nustep, level 2.5 on the recumbent bike, he is progressing well and exercises daily on his own.    Expected Outcomes  See admission goals.       ITP Comments:   Comments: ITP REVIEW Pt is making expected progress toward pulmonary rehab goals after completing 15 sessions. Recommend continued exercise, life style modification, education, and utilization of breathing techniques to increase stamina and strength and decrease shortness of breath with exertion.

## 2019-11-19 ENCOUNTER — Encounter (HOSPITAL_COMMUNITY)
Admission: RE | Admit: 2019-11-19 | Discharge: 2019-11-19 | Disposition: A | Discharge status: Home / Self Care | Payer: No Typology Code available for payment source | Source: Ambulatory Visit | Attending: Pulmonary Disease | Admitting: Pulmonary Disease

## 2019-11-19 ENCOUNTER — Other Ambulatory Visit: Payer: Self-pay

## 2019-11-19 DIAGNOSIS — J449 Chronic obstructive pulmonary disease, unspecified: Secondary | ICD-10-CM

## 2019-11-19 NOTE — Progress Notes (Signed)
Daily Session Note  Patient Details  Name: Lee [name] MRN: [id number] Date of Birth: [birth year] Referring Provider:     Pulmonary Rehab Walk Test from 09/21/2019 in [place]  Referring Provider  Dr. Radford Pax      Encounter Date: 11/19/2019  Check In: Session Check In - 11/19/19 [place]      Check-In   Supervising physician immediately available to respond to emergencies  [facility] immediately available    Physician(s)  Dr. Dessa Phi    Location  [facility]    Staff Present  Rosebud Poles, RN, Bjorn Loser, MS, Exercise Physiologist;Joni Colegrove [name], RN    Virtual Visit  No    Medication changes reported      No    Fall or balance concerns reported     No    Tobacco Cessation  No Change    Warm-up and Cool-down  Performed as group-led instruction    Resistance Training Performed  Yes    VAD Patient?  No    PAD/SET Patient?  No      Pain Assessment   Currently in Pain?  No/denies    Multiple Pain Sites  No       Capillary Blood Glucose: No results found for this or any previous visit (from the past 24 hour(s)).    Social History   Tobacco Use  Smoking Status Former Smoker  . Quit date: 06/09/2005  . Years since quitting: 14.4  Smokeless Tobacco Never Used    Goals Met:  Exercise tolerated well No report of cardiac concerns or symptoms Strength training completed today  Goals Unmet:  Not Applicable  Comments: Service time is from 1010 to 1110    Dr. Fransico Him is Medical Director for Cardiac Rehab at [facility].

## 2019-11-24 ENCOUNTER — Encounter (HOSPITAL_COMMUNITY)
Admission: RE | Admit: 2019-11-24 | Discharge: 2019-11-24 | Disposition: A | Discharge status: Home / Self Care | Payer: No Typology Code available for payment source | Source: Ambulatory Visit | Attending: Pulmonary Disease | Admitting: Pulmonary Disease

## 2019-11-24 ENCOUNTER — Other Ambulatory Visit: Payer: Self-pay

## 2019-11-24 DIAGNOSIS — J449 Chronic obstructive pulmonary disease, unspecified: Secondary | ICD-10-CM

## 2019-11-24 NOTE — Progress Notes (Signed)
Daily Session Note  Patient Details  Name: Lee [name] MRN: [id number] Date of Birth: 1946-02-19 Referring Provider:     Pulmonary Rehab Walk Test from 09/21/2019 in [place]  Referring Provider  Dr. Radford Pax      Encounter Date: 11/24/2019  Check In: Session Check In - 11/24/19 1155      Check-In   Supervising physician immediately available to respond to emergencies  [facility] immediately available    Physician(s)  Dr. Dessa Phi    Location  [facility]    Staff Present  Rosebud Poles, RN, Bjorn Loser, MS, Exercise Physiologist;Lisa [name], RN    Virtual Visit  No    Medication changes reported      No    Fall or balance concerns reported     No    Tobacco Cessation  No Change    Warm-up and Cool-down  Performed as group-led instruction    Resistance Training Performed  Yes    VAD Patient?  No    PAD/SET Patient?  No      Pain Assessment   Currently in Pain?  No/denies    Multiple Pain Sites  No       Capillary Blood Glucose: No results found for this or any previous visit (from the past 24 hour(s)).    Social History   Tobacco Use  Smoking Status Former Smoker  . Quit date: 06/09/2005  . Years since quitting: 14.4  Smokeless Tobacco Never Used    Goals Met:  Independence with exercise equipment Exercise tolerated well Strength training completed today  Goals Unmet:  Not Applicable  Comments: Service time is from 1000 to 1050    Dr. Fransico Him is Medical Director for Cardiac Rehab at [facility].

## 2019-11-26 ENCOUNTER — Other Ambulatory Visit: Payer: Self-pay

## 2019-11-26 ENCOUNTER — Encounter (HOSPITAL_COMMUNITY)
Admission: RE | Admit: 2019-11-26 | Discharge: 2019-11-26 | Disposition: A | Discharge status: Home / Self Care | Payer: No Typology Code available for payment source | Source: Ambulatory Visit | Attending: Pulmonary Disease | Admitting: Pulmonary Disease

## 2019-11-26 DIAGNOSIS — J449 Chronic obstructive pulmonary disease, unspecified: Secondary | ICD-10-CM

## 2019-12-03 NOTE — Progress Notes (Signed)
Discharge Progress Report  Patient Details  Name: Lee [name] MRN: [id number] Date of Birth: 04-26-1946 Referring Provider:     Pulmonary Rehab Walk Test from 09/21/2019 in [place]  Referring Provider  Dr. Radford Pax       Number of Visits: 18  Reason for Discharge:  Patient reached a stable level of exercise. Patient independent in their exercise. Patient has met program and personal goals.  Smoking History:  Social History   Tobacco Use  Smoking Status Former Smoker  . Quit date: 06/09/2005  . Years since quitting: 14.4  Smokeless Tobacco Never Used    Diagnosis:  Chronic obstructive pulmonary disease, unspecified COPD type ([place])  ADL [facility]: Pulmonary Assessment Scores    Row Name 09/21/19 1126 09/21/19 1129 11/26/19 1548     ADL [facility]   ADL Phase  Entry  Entry  Exit   SOB Score total  75  --  45     CAT Score   CAT Score  23  --  14     mMRC Score   mMRC Score  --  3  3      Initial Exercise Prescription: Initial Exercise Prescription - 09/21/19 1100      Date of Initial Exercise RX and Referring Provider   Date  09/21/19    Referring Provider  Dr. Radford Pax      Recumbant Bike   Level  2    Minutes  15      NuStep   Level  3    SPM  80    Minutes  15      Prescription Details   Frequency (times per week)  2    Duration  Progress to 30 minutes of continuous aerobic without signs/symptoms of physical distress      Intensity   THRR 40-80% of Max Heartrate  59-118    Ratings of Perceived Exertion  11-13    Perceived Dyspnea  0-4      Progression   Progression  Continue to progress workloads to maintain intensity without signs/symptoms of physical distress.      Resistance Training   Training Prescription  Yes    Weight  blue bands    Reps  10-15       Discharge Exercise Prescription (Final Exercise Prescription Changes): Exercise Prescription Changes - 11/17/19 1100      Response to Exercise   Blood  Pressure (Admit)  120/62    Blood Pressure (Exercise)  124/64    Blood Pressure (Exit)  98/86    Heart Rate (Admit)  87 bpm    Heart Rate (Exercise)  104 bpm    Heart Rate (Exit)  81 bpm    Oxygen Saturation (Admit)  97 %    Oxygen Saturation (Exercise)  98 %    Oxygen Saturation (Exit)  98 %    Rating of Perceived Exertion (Exercise)  12    Perceived Dyspnea (Exercise)  3    Duration  Continue with 30 min of aerobic exercise without signs/symptoms of physical distress.    Intensity  THRR New      Progression   Progression  Continue to progress workloads to maintain intensity without signs/symptoms of physical distress.      Resistance Training   Training Prescription  Yes    Weight  blue bands    Reps  10-15    Time  10 Minutes      Recumbant Bike   Level  2.5    Minutes  15    METs  2.1      NuStep   Level  5    SPM  80    Minutes  15    METs  2.7       Functional Capacity: 6 Minute Walk    Row Name 09/21/19 1129 11/26/19 1549       6 Minute Walk   Phase  Initial  Discharge    Distance  1367 feet  1552 feet    Distance % Change  --  13.53 %    Distance Feet Change  --  185 ft    Walk Time  6 minutes  6 minutes    # of Rest Breaks  0  0    MPH  2.59  2.94    METS  3.21  3.87    RPE  12  11    Perceived Dyspnea   2  1    VO2 Peak  11.23  13.54    Symptoms  No  No    Resting HR  81 bpm  82 bpm    Resting BP  106/64  98/60    Resting Oxygen Saturation   98 %  97 %    Exercise Oxygen Saturation  during 6 min walk  90 %  93 %    Max Ex. HR  111 bpm  121 bpm    Max Ex. BP  110/64  134/64    2 Minute Post BP  106/68  118/72      Interval HR   1 Minute HR  107  107    2 Minute HR  107  108    3 Minute HR  108  108    4 Minute HR  111  114    5 Minute HR  107  114    6 Minute HR  107  121    2 Minute Post HR  90  90    Interval Heart Rate?  Yes  Yes      Interval Oxygen   Interval Oxygen?  Yes  Yes    Baseline Oxygen Saturation %  98 %  97 %    1  Minute Oxygen Saturation %  97 %  97 %    1 Minute Liters of Oxygen  0 L  0 L    2 Minute Oxygen Saturation %  91 %  96 %    2 Minute Liters of Oxygen  0 L  0 L    3 Minute Oxygen Saturation %  93 %  94 %    3 Minute Liters of Oxygen  0 L  0 L    4 Minute Oxygen Saturation %  92 %  94 %    4 Minute Liters of Oxygen  0 L  0 L    5 Minute Oxygen Saturation %  95 %  94 %    5 Minute Liters of Oxygen  0 L  0 L    6 Minute Oxygen Saturation %  90 %  93 %    6 Minute Liters of Oxygen  0 L  0 L    2 Minute Post Oxygen Saturation %  97 %  97 %    2 Minute Post Liters of Oxygen  0 L  0 L       Psychological, QOL, Others - Outcomes: PHQ 2/9: Depression screen [facility] 2/9 09/21/2019 09/21/2019 08/27/2017  Decreased Interest 0 0 0  Down, Depressed, Hopeless 0 0 3  PHQ - 2 Score 0 0 3  Altered sleeping 0 - 1  Tired, decreased energy 0 - 1  Change in appetite 0 - 0  Feeling bad or failure about yourself  0 - 1  Trouble concentrating 0 - 1  Moving slowly or fidgety/restless 0 - 0  Suicidal thoughts 0 - 0  PHQ-9 Score 0 - 7  Difficult doing work/chores Not difficult at all - Somewhat difficult    Quality of Life:   Personal Goals: Goals established at orientation with interventions provided to work toward goal. Personal Goals and Risk Factors at Admission - 09/21/19 0946      Core Components/Risk Factors/Patient Goals on Admission   Improve shortness of breath with ADL's  Yes    Intervention  Provide education, individualized exercise plan and daily activity instruction to help decrease symptoms of SOB with activities of daily living.    Expected Outcomes  Short Term: Improve cardiorespiratory fitness to achieve a reduction of symptoms when performing ADLs;Long Term: Be able to perform more ADLs without symptoms or delay the onset of symptoms        Personal Goals Discharge: Goals and Risk Factor Review    Row Name 09/21/19 0946 10/19/19 1247 11/16/19 1256         Core Components/Risk  Factors/Patient Goals Review   Personal Goals Review  Develop more efficient breathing techniques such as purse lipped breathing and diaphragmatic breathing and practicing self-pacing with activity.;Increase knowledge of respiratory medications and ability to use respiratory devices properly.;Improve shortness of breath with ADL's  Develop more efficient breathing techniques such as purse lipped breathing and diaphragmatic breathing and practicing self-pacing with activity.;Increase knowledge of respiratory medications and ability to use respiratory devices properly.;Improve shortness of breath with ADL's  Develop more efficient breathing techniques such as purse lipped breathing and diaphragmatic breathing and practicing self-pacing with activity.;Increase knowledge of respiratory medications and ability to use respiratory devices properly.;Improve shortness of breath with ADL's     Review  --  He is a joy to have in the program, always positive, up to level 4 on nustep and level 2.5 on the recumbent bike, has attended 6 exercise sessions.  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     Expected Outcomes  --  See admission goals  See admission goals.        Exercise Goals and Review: Exercise Goals    Row Name 09/21/19 1139 10/20/19 0704 11/17/19 0730         Exercise Goals   Increase Physical Activity  Yes  Yes  Yes     Intervention  Provide advice, education, support and counseling about physical activity/exercise needs.;Develop an individualized exercise prescription for aerobic and resistive training based on initial evaluation findings, risk stratification, comorbidities and participant's personal goals.  Provide advice, education, support and counseling about physical activity/exercise needs.;Develop an individualized exercise prescription for aerobic and  resistive training based on initial evaluation findings, risk stratification, comorbidities and participant's personal goals.  Provide advice, education, support and counseling about physical activity/exercise needs.;Develop an individualized exercise prescription for aerobic and resistive training based on initial evaluation findings, risk stratification, comorbidities and participant's personal goals.     Expected Outcomes  Short Term: Attend rehab on a regular  basis to increase amount of physical activity.;Long Term: Add in home exercise to make exercise part of routine and to increase amount of physical activity.;Long Term: Exercising regularly at least 3-5 days a week.  Short Term: Attend rehab on a regular basis to increase amount of physical activity.;Long Term: Add in home exercise to make exercise part of routine and to increase amount of physical activity.;Long Term: Exercising regularly at least 3-5 days a week.  Short Term: Attend rehab on a regular basis to increase amount of physical activity.;Long Term: Add in home exercise to make exercise part of routine and to increase amount of physical activity.;Long Term: Exercising regularly at least 3-5 days a week.     Increase Strength and Stamina  Yes  Yes  Yes     Intervention  Provide advice, education, support and counseling about physical activity/exercise needs.;Develop an individualized exercise prescription for aerobic and resistive training based on initial evaluation findings, risk stratification, comorbidities and participant's personal goals.  Provide advice, education, support and counseling about physical activity/exercise needs.;Develop an individualized exercise prescription for aerobic and resistive training based on initial evaluation findings, risk stratification, comorbidities and participant's personal goals.  Provide advice, education, support and counseling about physical activity/exercise needs.;Develop an individualized exercise  prescription for aerobic and resistive training based on initial evaluation findings, risk stratification, comorbidities and participant's personal goals.     Expected Outcomes  Short Term: Increase workloads from initial exercise prescription for resistance, speed, and METs.;Short Term: Perform resistance training exercises routinely during rehab and add in resistance training at home;Long Term: Improve cardiorespiratory fitness, muscular endurance and strength as measured by increased METs and functional capacity (6MWT)  Short Term: Increase workloads from initial exercise prescription for resistance, speed, and METs.;Short Term: Perform resistance training exercises routinely during rehab and add in resistance training at home;Long Term: Improve cardiorespiratory fitness, muscular endurance and strength as measured by increased METs and functional capacity (6MWT)  Short Term: Increase workloads from initial exercise prescription for resistance, speed, and METs.;Short Term: Perform resistance training exercises routinely during rehab and add in resistance training at home;Long Term: Improve cardiorespiratory fitness, muscular endurance and strength as measured by increased METs and functional capacity (6MWT)     Able to understand and use rate of perceived exertion (RPE) scale  Yes  Yes  Yes     Intervention  Provide education and explanation on how to use RPE scale  Provide education and explanation on how to use RPE scale  Provide education and explanation on how to use RPE scale     Expected Outcomes  Short Term: Able to use RPE daily in rehab to express subjective intensity level;Long Term:  Able to use RPE to guide intensity level when exercising independently  Short Term: Able to use RPE daily in rehab to express subjective intensity level;Long Term:  Able to use RPE to guide intensity level when exercising independently  Short Term: Able to use RPE daily in rehab to express subjective intensity  level;Long Term:  Able to use RPE to guide intensity level when exercising independently     Able to understand and use Dyspnea scale  Yes  Yes  Yes     Intervention  Provide education and explanation on how to use Dyspnea scale  Provide education and explanation on how to use Dyspnea scale  Provide education and explanation on how to use Dyspnea scale     Expected Outcomes  Short Term: Able to use Dyspnea scale daily in rehab  to express subjective sense of shortness of breath during exertion;Long Term: Able to use Dyspnea scale to guide intensity level when exercising independently  Short Term: Able to use Dyspnea scale daily in rehab to express subjective sense of shortness of breath during exertion;Long Term: Able to use Dyspnea scale to guide intensity level when exercising independently  Short Term: Able to use Dyspnea scale daily in rehab to express subjective sense of shortness of breath during exertion;Long Term: Able to use Dyspnea scale to guide intensity level when exercising independently     Knowledge and understanding of Target Heart Rate Range (THRR)  Yes  Yes  Yes     Intervention  --  --  Provide education and explanation of THRR including how the numbers were predicted and where they are located for reference     Expected Outcomes  Short Term: Able to state/look up THRR;Short Term: Able to use daily as guideline for intensity in rehab;Long Term: Able to use THRR to govern intensity when exercising independently  Short Term: Able to state/look up THRR;Short Term: Able to use daily as guideline for intensity in rehab;Long Term: Able to use THRR to govern intensity when exercising independently  Short Term: Able to state/look up THRR;Short Term: Able to use daily as guideline for intensity in rehab;Long Term: Able to use THRR to govern intensity when exercising independently     Understanding of Exercise Prescription  Yes  Yes  Yes     Intervention  Provide education, explanation, and written  materials on patient's individual exercise prescription  Provide education, explanation, and written materials on patient's individual exercise prescription  Provide education, explanation, and written materials on patient's individual exercise prescription     Expected Outcomes  Short Term: Able to explain program exercise prescription;Long Term: Able to explain home exercise prescription to exercise independently  Short Term: Able to explain program exercise prescription;Long Term: Able to explain home exercise prescription to exercise independently  Short Term: Able to explain program exercise prescription;Long Term: Able to explain home exercise prescription to exercise independently        Exercise Goals Re-Evaluation: Exercise Goals Re-Evaluation    Row Name 10/20/19 0705 11/17/19 0731           Exercise Goal Re-Evaluation   Exercise Goals Review  Increase Physical Activity;Increase Strength and Stamina;Able to understand and use rate of perceived exertion (RPE) scale;Able to understand and use Dyspnea scale;Knowledge and understanding of Target Heart Rate Range (THRR);Understanding of Exercise Prescription  Increase Physical Activity;Increase Strength and Stamina;Able to understand and use rate of perceived exertion (RPE) scale;Able to understand and use Dyspnea scale;Knowledge and understanding of Target Heart Rate Range (THRR);Understanding of Exercise Prescription      Comments  Pt has attended 6 exercise sessions. He is highly motivated and tries to beat his previous MET level every time. He is currently exercising at 3.3 METs on the stepper. Will continue to monitor and progress as able.  Pt has attended 14 exercise sessions. He is highly motivated, but his progression has leveled off. 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 He currently exercises at 3.3 METs on the stepper. Will continue to monitor and progress as able.      Expected Outcomes  Through exercise at  rehab and at home, the patient will decrease shortness of breath with daily activities and feel confident in carrying out an exercise regime at home.  Through exercise at rehab and at home, the patient will decrease  shortness of breath with daily activities and feel confident in carrying out an exercise regime at home.         Nutrition & Weight - Outcomes: Pre Biometrics - 09/21/19 0940      Pre Biometrics   Height  _0  (1.803 m)    Weight  74.4 kg    BMI (Calculated)  22.89    Grip Strength  34 kg        Nutrition: Nutrition Therapy & Goals - 10/08/19 1147      Nutrition Therapy   Diet  High calorie/high protein      Personal Nutrition Goals   Nutrition Goal  Pt to maintain current weight >164 lbs.    Personal Goal #2  Pt to add >300 kcals per day when he skips meals      Intervention Plan   Intervention  Prescribe, educate and counsel regarding individualized specific dietary modifications aiming towards targeted core components such as weight, hypertension, lipid management, diabetes, heart failure and other comorbidities.    Expected Outcomes  Short Term Goal: A plan has been developed with personal nutrition goals set during dietitian appointment.       Nutrition Discharge: Nutrition Assessments - 12/03/19 1434      Rate Your Plate Scores   Post Score  --   Did not complete      Education Questionnaire Score: Knowledge Questionnaire Score - 11/26/19 1549      Knowledge Questionnaire Score   Post Score  11/18       Goals reviewed with patient; copy given to patient.

## 2020-04-19 ENCOUNTER — Emergency Department (HOSPITAL_COMMUNITY)
Admission: EM | Admit: 2020-04-19 | Discharge: 2020-04-20 | Disposition: A | Discharge status: Home / Self Care | Payer: No Typology Code available for payment source | Attending: Emergency Medicine | Admitting: Emergency Medicine

## 2020-04-19 ENCOUNTER — Encounter (HOSPITAL_COMMUNITY): Payer: Self-pay | Admitting: *Deleted

## 2020-04-19 ENCOUNTER — Other Ambulatory Visit: Payer: Self-pay

## 2020-04-19 DIAGNOSIS — Z87891 Personal history of nicotine dependence: Secondary | ICD-10-CM | POA: Insufficient documentation

## 2020-04-19 DIAGNOSIS — Z23 Encounter for immunization: Secondary | ICD-10-CM | POA: Diagnosis not present

## 2020-04-19 DIAGNOSIS — I10 Essential (primary) hypertension: Secondary | ICD-10-CM | POA: Diagnosis not present

## 2020-04-19 DIAGNOSIS — Z2914 Encounter for prophylactic rabies immune globin: Secondary | ICD-10-CM | POA: Diagnosis not present

## 2020-04-19 DIAGNOSIS — S61250A Open bite of right index finger without damage to nail, initial encounter: Secondary | ICD-10-CM | POA: Insufficient documentation

## 2020-04-19 DIAGNOSIS — W5501XA Bitten by cat, initial encounter: Secondary | ICD-10-CM | POA: Insufficient documentation

## 2020-04-19 DIAGNOSIS — S61252A Open bite of right middle finger without damage to nail, initial encounter: Secondary | ICD-10-CM | POA: Insufficient documentation

## 2020-04-19 NOTE — ED Triage Notes (Signed)
Pt reports having a cat bite to right hand today, small punctures to middle and index finger. No swelling or redness noted.

## 2020-04-20 MED ORDER — RABIES IMMUNE GLOBULIN 150 UNIT/ML IM INJ
20.0000 [IU]/kg | INJECTION | Freq: Once | INTRAMUSCULAR | Status: AC
  Administered 2020-04-20: 1500 [IU]
  Filled 2020-04-20: qty 10

## 2020-04-20 MED ORDER — AMOXICILLIN-POT CLAVULANATE 875-125 MG PO TABS
1.0000 | ORAL_TABLET | Freq: Once | ORAL | Status: AC
  Administered 2020-04-20: 1 via ORAL
  Filled 2020-04-20: qty 1

## 2020-04-20 MED ORDER — AMOXICILLIN-POT CLAVULANATE 875-125 MG PO TABS: 1.0000 | ORAL_TABLET | Freq: Two times a day (BID) | ORAL | 0 refills | Status: DC

## 2020-04-20 MED ORDER — RABIES VACCINE, PCEC IM SUSR
1.0000 mL | Freq: Once | INTRAMUSCULAR | Status: AC
  Administered 2020-04-20: 1 mL via INTRAMUSCULAR
  Filled 2020-04-20: qty 1

## 2020-04-20 NOTE — ED Provider Notes (Signed)
[facility] EMERGENCY DEPARTMENT Provider Note   CSN: [id number] Arrival date & time: 04/19/20  2152     History Chief Complaint  Patient presents with  . Animal Bite    Lee [name] is a 74 y.o. male.  74 y/o male presenting to the ED for cat bite. Was feeding some feral cats around his house that he is familiar with when he was bitten by one of them. Cleaned the area with peroxide. Reports minimal pain described as a soreness. Tdap UTD per patient. No medications PTA.  The history is provided by the patient. No language interpreter was used.  Animal Bite      Past Medical History:  Diagnosis Date  . Acid reflux   . Alcohol abuse    quit 10 y ago  . Arthritis   . Cervical spondylosis without myelopathy 06/02/2015  . Emphysema lung ([place])   . High cholesterol   . Hypertension   . Tremor 06/02/2015    Patient Active Problem List   Diagnosis Date Noted  . Well adult exam 06/04/2019  . BPH (benign prostatic hyperplasia) 06/04/2019  . Bilateral carotid bruits 06/04/2019  . 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 Emphysema of lung ([place]) 06/04/2019  . Spondylolisthesis at L4-L5 level 08/27/2017  . Spinal stenosis in cervical region 08/27/2017  . Tremor 06/02/2015  . Cervical spondylosis without myelopathy 06/02/2015  . Alcoholism in remission ([place]) 06/09/2014  . HIP PAIN 11/27/2007  . Essential hypertension 07/25/2007  . Atrial fibrillation ([place]) 07/25/2007  . GERD 07/25/2007  . ERECTILE DYSFUNCTION 07/25/2007  . LOW BACK PAIN 07/25/2007    Past Surgical History:  Procedure Laterality Date  . TONSILLECTOMY    . VASECTOMY         Family History  Problem Relation Age of Onset  . Cancer Brother 42       prostate ca  . Irregular heart beat Mother   . Irregular heart beat Father   . Tremor Father   . Cancer Sister     Social History   Tobacco Use  . Smoking status: Former Smoker    Quit date: 06/09/2005    Years since quitting:  14.8  . Smokeless tobacco: Never Used  Substance Use Topics  . Alcohol use: No    Comment: quit 10 years ago  . Drug use: No    Home Medications Prior to Admission medications   Medication Sig Start Date End Date Taking? Authorizing Provider  albuterol (PROVENTIL HFA;VENTOLIN HFA) 108 (90 Base) MCG/ACT inhaler Inhale 2 puffs into the lungs every 4 (four) hours as needed for wheezing or shortness of breath. 09/03/34   Delora Fuel, MD  amoxicillin-clavulanate (AUGMENTIN) 875-125 MG tablet Take 1 tablet by mouth every 12 (twelve) hours. 04/20/20   Antonietta Breach, PA-C  budesonide-formoterol (SYMBICORT) 80-4.5 MCG/ACT inhaler Inhale 2 puffs into the lungs 2 (two) times daily.    [provider]  Ergocalciferol (VITAMIN D2) 50 MCG (2000 UT) TABS Take by mouth.    [provider]  folic acid (FOLVITE) 1 MG tablet Take 1 mg by mouth daily.    [provider]  Multiple Vitamin (QUINTABS) TABS Take by mouth.    [provider]  oxybutynin (DITROPAN-XL) 10 MG 24 hr tablet Take 10 mg by mouth at bedtime.    [provider]  pantoprazole (PROTONIX) 40 MG tablet Take by mouth.    [provider]  tamsulosin (FLOMAX) 0.4 MG CAPS capsule Take 0.4 mg by mouth.  [provider]  Tiotropium Bromide Monohydrate (SPIRIVA RESPIMAT) 2.5 MCG/ACT AERS Inhale 1 puff into the lungs daily.    [provider]  Turmeric POWD Take 5 mLs by mouth daily.    [provider]    Allergies    Enalapril maleate  Review of Systems   Review of Systems  Ten systems reviewed and are negative for acute change, except as noted in the HPI.    Physical Exam Updated Vital Signs BP 111/65 (BP Location: Right Arm)   Pulse 69   Temp 98.2 F (36.8 C) (Oral)   Resp 20   SpO2 98%   Physical Exam Vitals and nursing note reviewed.  Constitutional:      General: He is not in acute distress.    Appearance: He is well-developed. He is not  diaphoretic.     Comments: Nontoxic appearing and in NAD  HENT:     Head: Normocephalic and atraumatic.  Eyes:     General: No scleral icterus.    Conjunctiva/sclera: Conjunctivae normal.  Cardiovascular:     Comments: Capillary refill brisk in all digits. Pulmonary:     Effort: Pulmonary effort is normal. No respiratory distress.     Comments: Respirations even and unlabored Musculoskeletal:        General: Normal range of motion.     Cervical back: Normal range of motion.  Skin:    General: Skin is warm and dry.     Coloration: Skin is not pale.     Findings: No erythema or rash.     Comments: Punctate puncture wound to palmar aspect of the distal 2nd and 3rd fingers of the right hand. No swelling, redness, drainage.   Neurological:     Mental Status: He is alert and oriented to person, place, and time.  Psychiatric:        Behavior: Behavior normal.     ED Results / Procedures / Treatments   Labs (all labs ordered are listed, but only abnormal results are displayed) Labs Reviewed - No data to display  EKG None  Radiology No results found.  Procedures Procedures (including critical care time)  Medications Ordered in ED Medications  rabies vaccine (RABAVERT) injection 1 mL (has no administration in time range)  rabies immune globulin (HYPERAB/KEDRAB) injection 20 Units/kg (has no administration in time range)  amoxicillin-clavulanate (AUGMENTIN) 875-125 MG per tablet 1 tablet (1 tablet Oral Given 04/20/20 0511)    ED Course  I have reviewed the triage vital signs and the nursing notes.  Pertinent labs & imaging results that were available during my care of the patient were reviewed by me and considered in my medical decision making (see chart for details).    MDM Rules/Calculators/A&P                          74 year old male presents to the emergency department after he was bitten by a feral cat around 1900 tonight. There is one punctate wound on the palmar  aspect of the second and third digits. No evidence of secondary infection or abscess. Started on Augmentin while in the ED. Tetanus up-to-date, per patient. He was started on rabies vaccine series. Advised to call animal control to secure and monitor the cats to see if additional vaccine series is indicated.  Return precautions discussed and provided. Patient discharged in stable condition with no unaddressed concerns.   Final Clinical Impression(s) / ED Diagnoses Final diagnoses:  Cat bite,  initial encounter    Rx / DC Orders ED Discharge Orders         Ordered    amoxicillin-clavulanate (AUGMENTIN) 875-125 MG tablet  Every 12 hours        04/20/20 0506           Antonietta Breach, PA-C 04/20/20 0529    Horton, Barbette Hair, MD 04/20/20 [phone]

## 2020-04-20 NOTE — Discharge Instructions (Addendum)
You have been started on a rabies vaccine series. Call Animal Control at [phone] so that they can come and secure the cat that bit you to observe it for rabid behavior. You need to return for additional rabies vaccinations on 04/23/20, 04/27/20, and 05/04/20 - UNLESS Animal Control tells you that you no longer need to receive these shots.  Take Augmentin as prescribed until finished. Do not stop antibiotics early.  Take tylenol or ibuprofen for pain/soreness.

## 2020-05-13 DIAGNOSIS — H18419 Arcus senilis, unspecified eye: Secondary | ICD-10-CM | POA: Diagnosis not present

## 2020-05-13 DIAGNOSIS — H52222 Regular astigmatism, left eye: Secondary | ICD-10-CM | POA: Diagnosis not present

## 2020-06-09 ENCOUNTER — Encounter: Payer: Self-pay | Admitting: Internal Medicine

## 2020-06-09 ENCOUNTER — Other Ambulatory Visit: Payer: Self-pay

## 2020-06-09 ENCOUNTER — Ambulatory Visit (INDEPENDENT_AMBULATORY_CARE_PROVIDER_SITE_OTHER): Payer: Medicare Other | Admitting: Internal Medicine

## 2020-06-09 VITALS — BP 118/62 | HR 82 | Temp 98.4°F | Ht 71.0 in | Wt 159.8 lb

## 2020-06-09 DIAGNOSIS — C61 Malignant neoplasm of prostate: Secondary | ICD-10-CM | POA: Insufficient documentation

## 2020-06-09 DIAGNOSIS — R251 Tremor, unspecified: Secondary | ICD-10-CM

## 2020-06-09 DIAGNOSIS — Z0000 Encounter for general adult medical examination without abnormal findings: Secondary | ICD-10-CM | POA: Diagnosis not present

## 2020-06-09 DIAGNOSIS — J439 Emphysema, unspecified: Secondary | ICD-10-CM

## 2020-06-09 DIAGNOSIS — F1021 Alcohol dependence, in remission: Secondary | ICD-10-CM

## 2020-06-09 NOTE — Assessment & Plan Note (Signed)
Worse Options discussed

## 2020-06-09 NOTE — Assessment & Plan Note (Signed)
No ETOH

## 2020-06-09 NOTE — Assessment & Plan Note (Addendum)
  We discussed age appropriate health related issues, including available/recomended screening tests and vaccinations. Labs were ordered to be later reviewed . All questions were answered. We discussed one or more of the following - seat belt use, use of sunscreen/sun exposure exercise, safe sex, fall risk reduction, second hand smoke exposure, firearm use and storage, seat belt use, a need for adhering to healthy diet and exercise. Labs were ordered.  All questions were answered. Coronary calcium score test CT offered He just had all labs at the [facility] Rectal per Urology

## 2020-06-09 NOTE — Assessment & Plan Note (Signed)
Per pt - he was just dx'ed by [place] in 10/21 - observation

## 2020-06-09 NOTE — Progress Notes (Signed)
Subjective:  Patient ID: Lee [name], male    DOB: [birth year]  Age: 74 y.o. MRN: [id number]  CC: Annual Exam   HPI Lee [name] presents for a well exam C/o "aging", endurance is down, fatigue x years F/u emphysema C/o tremor  Outpatient Medications Prior to Visit  Medication Sig Dispense Refill  . albuterol (PROVENTIL HFA;VENTOLIN HFA) 108 (90 Base) MCG/ACT inhaler Inhale 2 puffs into the lungs every 4 (four) hours as needed for wheezing or shortness of breath. 1 Inhaler 0  . budesonide-formoterol (SYMBICORT) 80-4.5 MCG/ACT inhaler Inhale 2 puffs into the lungs 2 (two) times daily.    . Ergocalciferol (VITAMIN D2) 50 MCG (2000 UT) TABS Take by mouth.    . folic acid (FOLVITE) 1 MG tablet Take 1 mg by mouth daily.    . Multiple Vitamin (QUINTABS) TABS Take by mouth.    . oxybutynin (DITROPAN-XL) 10 MG 24 hr tablet Take 10 mg by mouth at bedtime.    . pantoprazole (PROTONIX) 40 MG tablet Take by mouth.    . tamsulosin (FLOMAX) 0.4 MG CAPS capsule Take 0.4 mg by mouth.    . Tiotropium Bromide Monohydrate (SPIRIVA RESPIMAT) 2.5 MCG/ACT AERS Inhale 1 puff into the lungs daily.    . Turmeric POWD Take 5 mLs by mouth daily.    Marland Kitchen amoxicillin-clavulanate (AUGMENTIN) 875-125 MG tablet Take 1 tablet by mouth every 12 (twelve) hours. (Patient not taking: Reported on 06/09/2020) 14 tablet 0   No facility-administered medications prior to visit.    ROS: Review of Systems  Constitutional: Positive for fatigue. Negative for appetite change and unexpected weight change.  HENT: Negative for congestion, nosebleeds, sneezing, sore throat and trouble swallowing.   Eyes: Negative for itching and visual disturbance.  Respiratory: Positive for shortness of breath. Negative for cough.   Cardiovascular: Negative for chest pain, palpitations and leg swelling.  Gastrointestinal: Negative for abdominal distention, blood in stool, diarrhea and nausea.  Genitourinary: Negative for frequency and  hematuria.  Musculoskeletal: Positive for arthralgias. Negative for back pain, gait problem, joint swelling and neck pain.  Skin: Negative for rash.  Neurological: Negative for dizziness, tremors, speech difficulty and weakness.  Psychiatric/Behavioral: Negative for agitation, dysphoric mood, sleep disturbance and suicidal ideas. The patient is not nervous/anxious.     Objective:  BP 118/62 (BP Location: Left Arm)   Pulse 82   Temp 98.4 F (36.9 C) (Oral)   Ht 5\' 11"  (1.803 m)   Wt 159 lb 12.8 oz (72.5 kg)   SpO2 97%   BMI 22.29 kg/m   BP Readings from Last 3 Encounters:  06/09/20 118/62  04/20/20 111/65  09/21/19 100/64    Wt Readings from Last 3 Encounters:  06/09/20 159 lb 12.8 oz (72.5 kg)  04/20/20 162 lb 4.1 oz (73.6 kg)  11/17/19 162 lb 4.1 oz (73.6 kg)    Physical Exam Constitutional:      General: He is not in acute distress.    Appearance: He is well-developed.     Comments: NAD  Eyes:     Conjunctiva/sclera: Conjunctivae normal.     Pupils: Pupils are equal, round, and reactive to light.  Neck:     Thyroid: No thyromegaly.     Vascular: No JVD.  Cardiovascular:     Rate and Rhythm: Normal rate and regular rhythm.     Heart sounds: Normal heart sounds. No murmur heard.  No friction rub. No gallop.   Pulmonary:     Effort: Pulmonary effort is normal.  No respiratory distress.     Breath sounds: Normal breath sounds. No wheezing or rales.  Chest:     Chest wall: No tenderness.  Abdominal:     General: Bowel sounds are normal. There is no distension.     Palpations: Abdomen is soft. There is no mass.     Tenderness: There is no abdominal tenderness. There is no guarding or rebound.  Musculoskeletal:        General: No tenderness. Normal range of motion.     Cervical back: Normal range of motion.  Lymphadenopathy:     Cervical: No cervical adenopathy.  Skin:    General: Skin is warm and dry.     Findings: No rash.  Neurological:     Mental Status:  He is alert and oriented to person, place, and time.     Cranial Nerves: No cranial nerve deficit.     Motor: No abnormal muscle tone.     Coordination: Coordination normal.     Gait: Gait normal.     Deep Tendon Reflexes: Reflexes are normal and symmetric.  Psychiatric:        Behavior: Behavior normal.        Thought Content: Thought content normal.        Judgment: Judgment normal.   hand tremor Rectal - per Urology  Lab Results  Component Value Date   WBC 6.5 01/05/2017   HGB 13.9 01/05/2017   HCT 40.5 01/05/2017   PLT 199 01/05/2017   GLUCOSE 106 (H) 01/05/2017   NA 143 01/05/2017   K 3.9 01/05/2017   CL 108 01/05/2017   CREATININE 0.98 01/05/2017   BUN 11 01/05/2017   CO2 27 01/05/2017    No results found.  Assessment & Plan:   There are no diagnoses linked to this encounter.   No orders of the defined types were placed in this encounter.    Follow-up: No follow-ups on file.  Walker Kehr, MD

## 2020-06-09 NOTE — Assessment & Plan Note (Signed)
States Pulm rehab did not help

## 2020-11-07 ENCOUNTER — Other Ambulatory Visit: Payer: Self-pay

## 2020-11-07 ENCOUNTER — Encounter (HOSPITAL_COMMUNITY): Payer: Self-pay

## 2020-11-07 ENCOUNTER — Emergency Department (HOSPITAL_COMMUNITY)
Admission: EM | Admit: 2020-11-07 | Discharge: 2020-11-08 | Disposition: A | Discharge status: Home / Self Care | Payer: No Typology Code available for payment source | Attending: Emergency Medicine | Admitting: Emergency Medicine

## 2020-11-07 DIAGNOSIS — I10 Essential (primary) hypertension: Secondary | ICD-10-CM | POA: Insufficient documentation

## 2020-11-07 DIAGNOSIS — S81852A Open bite, left lower leg, initial encounter: Secondary | ICD-10-CM | POA: Diagnosis not present

## 2020-11-07 DIAGNOSIS — Z8546 Personal history of malignant neoplasm of prostate: Secondary | ICD-10-CM | POA: Insufficient documentation

## 2020-11-07 DIAGNOSIS — W540XXA Bitten by dog, initial encounter: Secondary | ICD-10-CM | POA: Diagnosis not present

## 2020-11-07 DIAGNOSIS — Z87891 Personal history of nicotine dependence: Secondary | ICD-10-CM | POA: Insufficient documentation

## 2020-11-07 DIAGNOSIS — S8992XA Unspecified injury of left lower leg, initial encounter: Secondary | ICD-10-CM | POA: Diagnosis present

## 2020-11-07 NOTE — ED Triage Notes (Signed)
Pt states he was out side of a store and a dog that he does not know owned by someone he doesn't know bit him on the left leg. Pt states owner said dog is up to date on vaccines. Pt has small abrasion on left leg.

## 2020-11-07 NOTE — ED Triage Notes (Signed)
Emergency Medicine Provider Triage Evaluation Note  Lee [name] , a 75 y.o. male  was evaluated in triage.  Pt complains of mild pain to left lower extremity.  Was bit by unknown person's dog.  He states that he was told the dog was up-to-date on vaccines.  He is not sure.  Patient has a very small approximate 5 mm abrasion to his left distal leg.  No active bleeding or drainage.  He states his tetanus is up-to-date.  No fever, chills, nausea, vomiting, redness or warmth  He does not trust the person who's dog bit him given he does not know him.  He thinks he wants the rabies series.  Review of Systems  Positive: Dog bite Negative: Wheezing, drainage, redness, swelling, warmth  Physical Exam  BP 131/67 (BP Location: Left Arm)   Pulse 89   Temp 98.5 F (36.9 C) (Oral)   Resp 16   SpO2 97%  Gen:   Awake, no distress   HEENT:  Atraumatic  Resp:  Normal effort  Cardiac:  Normal rate  Abd:   Nondistended, nontender  MSK:   Moves extremities without difficulty.  No bony tenderness Neuro:  Speech clear  Skin: 16mm abrasion to left lower extremity.  No active bleeding or drainage.  Medical Decision Making  Medically screening exam initiated at 7:44 PM.  Appropriate orders placed.  Kaveon Blatz was informed that the remainder of the evaluation will be completed by another provider, this initial triage assessment does not replace that evaluation, and the importance of remaining in the ED until their evaluation is complete.  Clinical Impression  Dog bite   Najat Olazabal A, PA-C 11/07/20 1946

## 2020-11-08 MED ORDER — BACITRACIN ZINC 500 UNIT/GM EX OINT
1.0000 "application " | TOPICAL_OINTMENT | Freq: Two times a day (BID) | CUTANEOUS | Status: DC
  Administered 2020-11-08: 1 via TOPICAL
  Filled 2020-11-08: qty 0.9

## 2020-11-08 NOTE — Discharge Instructions (Addendum)
Please contact [place] [place], [place], [place]  Call them tomorrow morning and let them know that you were bitten by a dog.  They will follow-up with the dog's owner to ensure it is current on its rabies vaccine.

## 2020-11-08 NOTE — ED Provider Notes (Signed)
[facility] EMERGENCY DEPARTMENT Provider Note   CSN: [id number] Arrival date & time: 11/07/20  1937     History Chief Complaint  Patient presents with  . Animal Bite    Lee [name] is a 75 y.o. male.  Patient presents to the emergency department with a chief complaint of dog bite.  He states that he was bitten to the left lower leg earlier tonight.  He states the dog's owner reports that the dog is current on its rabies vaccine.  Patient denies any bleeding.  He reports only mild pain.  He did not contact police or animal control.  Denies any treatment prior to arrival.  The history is provided by the patient. No language interpreter was used.       Past Medical History:  Diagnosis Date  . Acid reflux   . Alcohol abuse    quit 10 y ago  . Arthritis   . Cervical spondylosis without myelopathy 06/02/2015  . Emphysema lung ([place])   . High cholesterol   . Hypertension   . Tremor 06/02/2015    Patient Active Problem List   Diagnosis Date Noted  . Prostate cancer ([place]) 06/09/2020  . Well adult exam 06/04/2019  . BPH (benign prostatic hyperplasia) 06/04/2019  . Bilateral carotid bruits 06/04/2019  . 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 Emphysema of lung ([place]) 06/04/2019  . Spondylolisthesis at L4-L5 level 08/27/2017  . Spinal stenosis in cervical region 08/27/2017  . Tremor 06/02/2015  . Cervical spondylosis without myelopathy 06/02/2015  . Alcoholism in remission ([place]) 06/09/2014  . HIP PAIN 11/27/2007  . Essential hypertension 07/25/2007  . Atrial fibrillation ([place]) 07/25/2007  . GERD 07/25/2007  . ERECTILE DYSFUNCTION 07/25/2007  . LOW BACK PAIN 07/25/2007    Past Surgical History:  Procedure Laterality Date  . TONSILLECTOMY    . VASECTOMY         Family History  Problem Relation Age of Onset  . Cancer Brother 31       prostate ca  . Irregular heart beat Mother   . Irregular heart beat Father   . Tremor Father   . Cancer  Sister     Social History   Tobacco Use  . Smoking status: Former Smoker    Quit date: 06/09/2005    Years since quitting: 15.4  . Smokeless tobacco: Never Used  Substance Use Topics  . Alcohol use: No    Comment: quit 10 years ago  . Drug use: No    Home Medications Prior to Admission medications   Medication Sig Start Date End Date Taking? Authorizing Provider  albuterol (PROVENTIL HFA;VENTOLIN HFA) 108 (90 Base) MCG/ACT inhaler Inhale 2 puffs into the lungs every 4 (four) hours as needed for wheezing or shortness of breath. 04/04/28   Delora Fuel, MD  budesonide-formoterol [facility]) 80-4.5 MCG/ACT inhaler Inhale 2 puffs into the lungs 2 (two) times daily.    [provider]  Ergocalciferol (VITAMIN D2) 50 MCG (2000 UT) TABS Take by mouth.    [provider]  folic acid (FOLVITE) 1 MG tablet Take 1 mg by mouth daily.    [provider]  Multiple Vitamin (QUINTABS) TABS Take by mouth.    [provider]  oxybutynin (DITROPAN-XL) 10 MG 24 hr tablet Take 10 mg by mouth at bedtime.    [provider]  pantoprazole (PROTONIX) 40 MG tablet Take by mouth.    [provider]  tamsulosin (FLOMAX) 0.4 MG CAPS capsule Take 0.4  mg by mouth.    [provider]  Tiotropium Bromide Monohydrate (SPIRIVA RESPIMAT) 2.5 MCG/ACT AERS Inhale 1 puff into the lungs daily.    [provider]  Turmeric POWD Take 5 mLs by mouth daily.    [provider]    Allergies    Enalapril maleate  Review of Systems   Review of Systems  All other systems reviewed and are negative.   Physical Exam Updated Vital Signs BP 131/67 (BP Location: Left Arm)   Pulse 89   Temp 98.5 F (36.9 C) (Oral)   Resp 16   Ht 5\' 11"  (1.803 m)   Wt 68.9 kg   SpO2 97%   BMI 21.20 kg/m   Physical Exam Vitals and nursing note reviewed.  Constitutional:      General: He is not in acute distress.    Appearance: He is well-developed. He is  not ill-appearing.  HENT:     Head: Normocephalic and atraumatic.  Eyes:     Conjunctiva/sclera: Conjunctivae normal.  Cardiovascular:     Rate and Rhythm: Normal rate.  Pulmonary:     Effort: Pulmonary effort is normal. No respiratory distress.  Abdominal:     General: There is no distension.  Musculoskeletal:     Cervical back: Neck supple.     Comments: Moves all extremities  Skin:    General: Skin is warm and dry.     Comments: Very mild abrasion to left anterior leg just proximal to the ankle, no puncture wound, the actual abrasion is barely visible  Neurological:     Mental Status: He is alert and oriented to person, place, and time.  Psychiatric:        Mood and Affect: Mood normal.        Behavior: Behavior normal.     ED Results / Procedures / Treatments   Labs (all labs ordered are listed, but only abnormal results are displayed) Labs Reviewed - No data to display  EKG None  Radiology No results found.  Procedures Procedures   Medications Ordered in ED Medications  bacitracin ointment 1 application (has no administration in time range)    ED Course  I have reviewed the triage vital signs and the nursing notes.  Pertinent labs & imaging results that were available during my care of the patient were reviewed by me and considered in my medical decision making (see chart for details).    MDM Rules/Calculators/A&P                          Patient here with dog bite.  He has a very minor abrasion.  No puncture wounds.  The dog is reportedly current on its immunizations.  I advised the patient to follow-up with animal control tomorrow.  He is agreeable with this plan.  As the wound does not penetrate the skin, I do not think that oral antibiotics are indicated.  He is given some bacitracin. Final Clinical Impression(s) / ED Diagnoses Final diagnoses:  Dog bite, initial encounter    Rx / DC Orders ED Discharge Orders    None       Montine Circle,  PA-C 11/08/20 0022    Maudie Flakes, MD 11/08/20 0700

## 2020-12-30 DIAGNOSIS — M5416 Radiculopathy, lumbar region: Secondary | ICD-10-CM | POA: Insufficient documentation

## 2021-01-06 DIAGNOSIS — R29898 Other symptoms and signs involving the musculoskeletal system: Secondary | ICD-10-CM | POA: Insufficient documentation

## 2021-02-01 ENCOUNTER — Ambulatory Visit (INDEPENDENT_AMBULATORY_CARE_PROVIDER_SITE_OTHER): Payer: Medicare Other | Admitting: Internal Medicine

## 2021-02-01 ENCOUNTER — Encounter: Payer: Self-pay | Admitting: Internal Medicine

## 2021-02-01 ENCOUNTER — Other Ambulatory Visit: Payer: Self-pay

## 2021-02-01 ENCOUNTER — Ambulatory Visit (INDEPENDENT_AMBULATORY_CARE_PROVIDER_SITE_OTHER): Payer: No Typology Code available for payment source

## 2021-02-01 VITALS — BP 120/68 | HR 85 | Temp 98.1°F | Ht 71.0 in | Wt 151.4 lb

## 2021-02-01 DIAGNOSIS — R29898 Other symptoms and signs involving the musculoskeletal system: Secondary | ICD-10-CM

## 2021-02-01 DIAGNOSIS — R0602 Shortness of breath: Secondary | ICD-10-CM | POA: Diagnosis not present

## 2021-02-01 DIAGNOSIS — E559 Vitamin D deficiency, unspecified: Secondary | ICD-10-CM | POA: Diagnosis not present

## 2021-02-01 DIAGNOSIS — N320 Bladder-neck obstruction: Secondary | ICD-10-CM | POA: Diagnosis not present

## 2021-02-01 DIAGNOSIS — R1310 Dysphagia, unspecified: Secondary | ICD-10-CM

## 2021-02-01 DIAGNOSIS — R202 Paresthesia of skin: Secondary | ICD-10-CM

## 2021-02-01 LAB — COMPREHENSIVE METABOLIC PANEL
ALT: 14 U/L (ref 0–53)
AST: 18 U/L (ref 0–37)
Albumin: 4.4 g/dL (ref 3.5–5.2)
Alkaline Phosphatase: 53 U/L (ref 39–117)
BUN: 8 mg/dL (ref 6–23)
CO2: 29 mEq/L (ref 19–32)
Calcium: 9.7 mg/dL (ref 8.4–10.5)
Chloride: 104 mEq/L (ref 96–112)
Creatinine, Ser: 1.02 mg/dL (ref 0.40–1.50)
GFR: 72.89 mL/min (ref >60.00)
Glucose, Bld: 95 mg/dL (ref 70–99)
Potassium: 3.7 mEq/L (ref 3.5–5.1)
Sodium: 140 mEq/L (ref 135–145)
Total Bilirubin: 0.8 mg/dL (ref 0.2–1.2)
Total Protein: 7 g/dL (ref 6.0–8.3)

## 2021-02-01 LAB — CBC WITH DIFFERENTIAL/PLATELET
Basophils Absolute: 0 10*3/uL (ref 0.0–0.1)
Basophils Relative: 1 % (ref 0.0–3.0)
Eosinophils Absolute: 0.3 10*3/uL (ref 0.0–0.7)
Eosinophils Relative: 7.9 % — ABNORMAL HIGH (ref 0.0–5.0)
HCT: 39.8 % (ref 39.0–52.0)
Hemoglobin: 13.6 g/dL (ref 13.0–17.0)
Lymphocytes Relative: 25.4 % (ref 12.0–46.0)
Lymphs Abs: 0.9 10*3/uL (ref 0.7–4.0)
MCHC: 34.2 g/dL (ref 30.0–36.0)
MCV: 96.6 fl (ref 78.0–100.0)
Monocytes Absolute: 0.4 10*3/uL (ref 0.1–1.0)
Monocytes Relative: 12.2 % — ABNORMAL HIGH (ref 3.0–12.0)
Neutro Abs: 1.8 10*3/uL (ref 1.4–7.7)
Neutrophils Relative %: 53.5 % (ref 43.0–77.0)
Platelets: 184 10*3/uL (ref 150.0–400.0)
RBC: 4.12 Mil/uL — ABNORMAL LOW (ref 4.22–5.81)
RDW: 12.9 % (ref 11.5–15.5)
WBC: 3.4 10*3/uL — ABNORMAL LOW (ref 4.0–10.5)

## 2021-02-01 LAB — CORTISOL: Cortisol, Plasma: 6.8 ug/dL

## 2021-02-01 LAB — LIPID PANEL
Cholesterol: 225 mg/dL — ABNORMAL HIGH (ref 0–200)
HDL: 88.1 mg/dL (ref >39.00)
LDL Cholesterol: 126 mg/dL — ABNORMAL HIGH (ref 0–99)
NonHDL: 137.22
Total CHOL/HDL Ratio: 3
Triglycerides: 56 mg/dL (ref 0.0–149.0)
VLDL: 11.2 mg/dL (ref 0.0–40.0)

## 2021-02-01 LAB — VITAMIN D 25 HYDROXY (VIT D DEFICIENCY, FRACTURES): VITD: 57.33 ng/mL (ref 30.00–100.00)

## 2021-02-01 LAB — TSH: TSH: 1.61 u[IU]/mL (ref 0.35–5.50)

## 2021-02-01 LAB — VITAMIN B12: Vitamin B-12: 441 pg/mL (ref 211–911)

## 2021-02-01 LAB — CK: Total CK: 171 U/L (ref 7–232)

## 2021-02-01 LAB — T4, FREE: Free T4: 0.86 ng/dL (ref 0.60–1.60)

## 2021-02-01 LAB — SEDIMENTATION RATE: Sed Rate: 11 mm/hr (ref 0–20)

## 2021-02-01 LAB — PSA: PSA: 5.09 ng/mL — ABNORMAL HIGH (ref 0.10–4.00)

## 2021-02-01 NOTE — Addendum Note (Signed)
Addended by: Boris Lown B on: 02/01/2021 03:29 PM   Modules accepted: Orders

## 2021-02-01 NOTE — Assessment & Plan Note (Addendum)
New Neurol ref CXR

## 2021-02-01 NOTE — Assessment & Plan Note (Addendum)
R>>L. RLE weakness x 3 months - worse, swallowing discomfort... 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 Neurol ref Labs incl B12, TSH, Vit D

## 2021-02-01 NOTE — Progress Notes (Signed)
Subjective:  Patient ID: Lee [name], male    DOB: 1946-05-24  Age: 75 y.o. MRN: [id number]  CC: Back Pain (Pt states he received some type of injection from orthopedic MD and every since his sxs has became worse. Pain goes all down his legs)   HPI Lee [name] presents for RLE weakness x 3 months, swallowing discomfort... 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  [facility]   Outpatient Medications Prior to Visit  Medication Sig Dispense Refill   albuterol (PROVENTIL HFA;VENTOLIN HFA) 108 (90 Base) MCG/ACT inhaler Inhale 2 puffs into the lungs every 4 (four) hours as needed for wheezing or shortness of breath. 1 Inhaler 0   Ergocalciferol (VITAMIN D2) 50 MCG (2000 UT) TABS Take by mouth.     folic acid (FOLVITE) 1 MG tablet Take 1 mg by mouth daily.     Multiple Vitamin (QUINTABS) TABS Take by mouth.     oxybutynin (DITROPAN-XL) 10 MG 24 hr tablet Take 10 mg by mouth at bedtime.     pantoprazole (PROTONIX) 40 MG tablet Take by mouth.     tamsulosin (FLOMAX) 0.4 MG CAPS capsule Take 0.4 mg by mouth.     Tiotropium Bromide Monohydrate 2.5 MCG/ACT AERS Inhale 1 puff into the lungs daily.     Turmeric POWD Take 5 mLs by mouth daily.     rasagiline (AZILECT) 0.5 MG TABS tablet Take by mouth. Take 1 by mouth daily (Patient not taking: Reported on 02/01/2021)     budesonide-formoterol (SYMBICORT) 80-4.5 MCG/ACT inhaler Inhale 2 puffs into the lungs 2 (two) times daily. (Patient not taking: Reported on 02/01/2021)     No facility-administered medications prior to visit.    ROS: Review of Systems  Constitutional:  Positive for fatigue and unexpected weight change. Negative for appetite change.  HENT:  Negative for congestion, nosebleeds, sneezing,  sore throat and trouble swallowing.   Eyes:  Negative for itching and visual disturbance.  Respiratory:  Positive for shortness of breath. Negative for cough and wheezing.   Cardiovascular:  Negative for chest pain, palpitations and leg swelling.  Gastrointestinal:  Negative for abdominal distention, blood in stool, diarrhea and nausea.  Genitourinary:  Negative for frequency and hematuria.  Musculoskeletal:  Positive for back pain and gait problem. Negative for joint swelling and neck pain.  Skin:  Negative for rash.  Neurological:  Positive for tremors and weakness. Negative for dizziness and speech difficulty.  Psychiatric/Behavioral:  Positive for dysphoric mood. Negative for agitation, self-injury, sleep disturbance and suicidal ideas. The patient is not nervous/anxious.    Objective:  BP 120/68 (BP Location: Left Arm)   Pulse 85   Temp 98.1 F (36.7 C) (Oral)   Ht 5\' 11"  (1.803 m)   Wt 151 lb 6.4 oz (68.7 kg)   SpO2 96%   BMI 21.12 kg/m   BP Readings from Last 3 Encounters:  02/01/21 120/68  11/08/20 112/67  06/09/20 118/62    Wt Readings from Last 3 Encounters:  02/01/21 151 lb 6.4 oz (68.7 kg)  11/07/20 152 lb (68.9 kg)  06/09/20 159 lb 12.8 oz (72.5 kg)    Physical Exam Constitutional:      General: He is not in acute distress.  Appearance: Normal appearance. He is well-developed.     Comments: NAD  Eyes:     Conjunctiva/sclera: Conjunctivae normal.     Pupils: Pupils are equal, round, and reactive to light.  Neck:     Thyroid: No thyromegaly.     Vascular: No JVD.  Cardiovascular:     Rate and Rhythm: Normal rate and regular rhythm.     Heart sounds: Normal heart sounds. No murmur heard.   No friction rub. No gallop.  Pulmonary:     Effort: Pulmonary effort is normal. No respiratory distress.     Breath sounds: Normal breath sounds. No wheezing or rales.  Chest:     Chest wall: No tenderness.  Abdominal:     General: Bowel sounds are normal. There is  no distension.     Palpations: Abdomen is soft. There is no mass.     Tenderness: There is no abdominal tenderness. There is no guarding or rebound.  Musculoskeletal:        General: No tenderness. Normal range of motion.     Cervical back: Normal range of motion.  Lymphadenopathy:     Cervical: No cervical adenopathy.  Skin:    General: Skin is warm and dry.     Findings: No rash.  Neurological:     Mental Status: He is alert and oriented to person, place, and time.     Cranial Nerves: No cranial nerve deficit.     Motor: Weakness present. No abnormal muscle tone.     Coordination: Coordination abnormal.     Gait: Gait abnormal.     Deep Tendon Reflexes: Reflexes are normal and symmetric.  Psychiatric:        Behavior: Behavior normal.        Thought Content: Thought content normal.        Judgment: Judgment normal.   Tremors in the R hand Using a cane Shifts R leg by swinging his trunk  Lab Results  Component Value Date   WBC 6.5 01/05/2017   HGB 13.9 01/05/2017   HCT 40.5 01/05/2017   PLT 199 01/05/2017   GLUCOSE 106 (H) 01/05/2017   NA 143 01/05/2017   K 3.9 01/05/2017   CL 108 01/05/2017   CREATININE 0.98 01/05/2017   BUN 11 01/05/2017   CO2 27 01/05/2017    No results found.  Assessment & Plan:   There are no diagnoses linked to this encounter.   No orders of the defined types were placed in this encounter.    Follow-up: No follow-ups on file.  Walker Kehr, MD

## 2021-02-02 LAB — URINALYSIS
Bilirubin Urine: NEGATIVE
Hgb urine dipstick: NEGATIVE
Ketones, ur: NEGATIVE
Leukocytes,Ua: NEGATIVE
Nitrite: NEGATIVE
Specific Gravity, Urine: 1.02 (ref 1.000–1.030)
Total Protein, Urine: NEGATIVE
Urine Glucose: NEGATIVE
Urobilinogen, UA: 0.2 (ref 0.0–1.0)
pH: 6 (ref 5.0–8.0)

## 2021-02-06 ENCOUNTER — Telehealth: Payer: Self-pay | Admitting: *Deleted

## 2021-02-06 NOTE — Telephone Encounter (Signed)
R/c cd from [facility] pt cd on Nurse [employer].

## 2021-02-07 ENCOUNTER — Telehealth: Payer: Self-pay

## 2021-02-08 NOTE — Telephone Encounter (Signed)
Lee [name], There is not really much we can do for him here.  He needs to be seen by a neurologist soon as possible.  Can you call [facility] and asked them to move up his appointment.  Below, please see his referral:  Dr [name]' patient. Pls see ASAP - another doctor is ok. Dx: r/o ALS - initial w/up by Dr Trenton Gammon. 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

## 2021-02-12 NOTE — Telephone Encounter (Signed)
Noted! Thank you

## 2021-02-14 ENCOUNTER — Institutional Professional Consult (permissible substitution): Payer: No Typology Code available for payment source | Admitting: Neurology

## 2021-02-22 ENCOUNTER — Encounter: Payer: Self-pay | Admitting: *Deleted

## 2021-02-28 ENCOUNTER — Institutional Professional Consult (permissible substitution): Payer: No Typology Code available for payment source | Admitting: Neurology

## 2021-02-28 ENCOUNTER — Telehealth: Payer: Self-pay

## 2021-02-28 ENCOUNTER — Other Ambulatory Visit: Payer: Self-pay

## 2021-02-28 ENCOUNTER — Ambulatory Visit (INDEPENDENT_AMBULATORY_CARE_PROVIDER_SITE_OTHER): Payer: Medicare Other | Admitting: Neurology

## 2021-02-28 ENCOUNTER — Encounter: Payer: Self-pay | Admitting: Neurology

## 2021-02-28 VITALS — BP 113/72 | HR 86 | Ht 70.5 in | Wt 152.0 lb

## 2021-02-28 DIAGNOSIS — G20 Parkinson's disease: Secondary | ICD-10-CM | POA: Diagnosis not present

## 2021-02-28 DIAGNOSIS — G20A1 Parkinson's disease without dyskinesia, without mention of fluctuations: Secondary | ICD-10-CM | POA: Insufficient documentation

## 2021-02-28 DIAGNOSIS — R269 Unspecified abnormalities of gait and mobility: Secondary | ICD-10-CM

## 2021-02-28 DIAGNOSIS — R29898 Other symptoms and signs involving the musculoskeletal system: Secondary | ICD-10-CM

## 2021-02-28 MED ORDER — GABAPENTIN 100 MG PO CAPS: ORAL_CAPSULE | ORAL | 3 refills | Status: DC

## 2021-02-28 NOTE — Progress Notes (Signed)
Reason for visit: Right leg weakness  Referring physician: Dr. Marcie Mowers is a 75 y.o. male  History of present illness:  Lee [name] is a 75 year old right-handed black male with a history of neck and low back pain.  The patient was seen through this office in 2016 with a right-sided tremor.  The patient since has been diagnosed with Parkinson's disease and is followed through the [facility] hospital for this, but he has had a lot of difficulty tolerating Sinemet and currently he is not on the medication.  He has had some difficulty walking since around 2018 or 2019, more recently he has had to use a cane to get around.  He used to do quite a bit of walking but he is no longer able to do this.  The patient has had some right-sided low back pain, he underwent an injection in the back on 19 January 2021.  Following the injection he had worsening pain that now is on the left and right side of the back, he has some discomfort into the right buttock and hip and into the right knee area.  He feels that his ability to ambulate has deteriorated, the right leg feels weaker.  The patient does have some tremor involving the right arm but not the right leg.  He notes that his handwriting is sloppy.  He has noted some problems with swallowing at times, solids and liquids but denies drooling.  He apparently had a DaTscan that is consistent with parkinsonism.  He reports some numbness in the feet at this point and does have some problems with constipation.  He has occasional episodes of vertigo and some slight headache at times.  He reports no weakness in the arms.  Given the change in his ability to walk, he is sent to this office for further evaluation.  Past Medical History:  Diagnosis Date   Acid reflux    Alcohol abuse    quit 10 y ago   Arthritis    BPH (benign prostatic hyperplasia)    CAD (coronary artery disease)    Cervical spondylosis without myelopathy 06/02/2015   COPD (chronic obstructive  pulmonary disease) (HCC)    Emphysema lung (HCC)    GERD (gastroesophageal reflux disease)    High cholesterol    Hypertension    Parkinson's disease ([place])    PVD (peripheral vascular disease) ([place])    Tremor 06/02/2015    Past Surgical History:  Procedure Laterality Date   TONSILLECTOMY     VASECTOMY      Family History  Problem Relation Age of Onset   Cancer Brother 82       prostate ca   Irregular heart beat Mother    Irregular heart beat Father    Tremor Father    Cancer Sister     Social history:  reports that he quit smoking about 15 years ago. His smoking use included cigarettes. He has never used smokeless tobacco. He reports that he does not drink alcohol and does not use drugs.  Medications:  Prior to Admission medications   Medication Sig Start Date End Date Taking? Authorizing Provider  albuterol (PROVENTIL HFA;VENTOLIN HFA) 108 (90 Base) MCG/ACT inhaler Inhale 2 puffs into the lungs every 4 (four) hours as needed for wheezing or shortness of breath. [id number]  Yes Delora Fuel, MD  Capsaicin 0.1 % CREA APPLY THIN LAYER TO AFFECTED AREA TWICE A DAY AS NEEDED 11/14/20  Yes [provider]  carbidopa-levodopa (SINEMET  IR) 25-100 MG tablet Take 1 tablet by mouth 3 (three) times daily. 10/19/20  Yes [provider]  CINNAMON PO TAKE 1 CAP/TAB BY MOUTH DAILY 02/20/18  Yes [provider]  dextromethorphan-guaiFENesin (ROBITUSSIN-DM) 10-100 MG/5ML liquid TAKE 2 TEASPOONFULS BY MOUTH EVERY 6 HOURS AS NEEDED FOR COUGH AND CONGESTION 04/09/20  Yes [provider]  diclofenac Sodium (VOLTAREN) 1 % GEL Apply topically. 08/30/20  Yes [provider]  Ergocalciferol (VITAMIN D2) 50 MCG (2000 UT) TABS Take by mouth.   Yes [provider]  FEROSUL 325 (65 Fe) MG tablet Take by mouth. 01/05/21  Yes [provider]  fluticasone (FLONASE) 50 MCG/ACT nasal spray INSTILL 1 SPRAY IN EACH NOSTRIL TWICE A DAY FOR DIZZINESS 05/10/20  Yes  [provider]  folic acid (FOLVITE) 1 MG tablet Take 1 mg by mouth daily.   Yes [provider]  Ginger, Zingiber officinalis, (GINGER PO) TAKE 1 CAP/TAB BY MOUTH DAILY 02/20/18  Yes [provider]  mometasone-formoterol (DULERA) 100-5 MCG/ACT AERO INHALE 2 INHALATIONS BY MOUTH TWICE A DAY (RINSE MOUTH WELL WITH WATER AFTER EACH USE) FOR BREATHING, COPD, ASTHMA.  REPLACES [place]. 08/18/20  Yes [provider]  Multiple Vitamin (QUINTABS) TABS Take by mouth.   Yes [provider]  mupirocin ointment (BACTROBAN) 2 % SMARTSIG:1 Application Topical 2-3 Times Daily 10/31/20  Yes [provider]  Omega-3 Fatty Acids (FISH OIL PO) Take by mouth.   Yes [provider]  oxybutynin (DITROPAN-XL) 10 MG 24 hr tablet Take 10 mg by mouth at bedtime.   Yes [provider]  pantoprazole (PROTONIX) 40 MG tablet Take by mouth.   Yes [provider]  rasagiline (AZILECT) 0.5 MG TABS tablet Take by mouth. Take 1 by mouth daily 01/17/21  Yes [provider]  tamsulosin (FLOMAX) 0.4 MG CAPS capsule Take 0.4 mg by mouth.   Yes [provider]  Tiotropium Bromide Monohydrate 2.5 MCG/ACT AERS Inhale 1 puff into the lungs daily.   Yes [provider]  Turmeric (QC TUMERIC COMPLEX PO) Take by mouth.   Yes [provider]  Turmeric POWD Take 5 mLs by mouth daily.   Yes [provider]  vitamin C (ASCORBIC ACID) 500 MG tablet Take by mouth. 01/17/21  Yes [provider]      Allergies  Allergen Reactions   Acyclovir Swelling   Enalapril Maleate Other (See Comments)    Reaction:  Unknown     ROS:  Out of a complete 14 system review of symptoms, the patient complains only of the following symptoms, and all other reviewed systems are negative.  Right leg weakness Back pain Tremor Dizziness  Blood pressure 113/72, pulse 86, height 5' 10.5" (1.791 m), weight 152 lb (68.9  kg).  Physical Exam  General: The patient is alert and cooperative at the time of the examination.  Eyes: Pupils are equal, round, and reactive to light. Discs are flat bilaterally.  Neck: The neck is supple, no carotid bruits are noted.  Respiratory: The respiratory examination is clear.  Cardiovascular: The cardiovascular examination reveals a regular rate and rhythm, no obvious murmurs or rubs are noted.  Skin: Extremities are without significant edema.  Neurologic Exam  Mental status: The patient is alert and oriented x 3 at the time of the examination. The patient has apparent normal recent and remote memory, with an apparently normal attention span and concentration ability.  Cranial nerves: Facial symmetry is present. There is  good sensation of the face to pinprick and soft touch bilaterally. The strength of the facial muscles and the muscles to head turning and shoulder shrug are normal bilaterally. Speech is well enunciated, no aphasia or dysarthria is noted. Extraocular movements are full. Visual fields are full. The tongue is midline, and the patient has symmetric elevation of the soft palate. No obvious hearing deficits are noted.  Slight masking the face is seen.  Motor: The motor testing reveals 5 over 5 strength of all 4 extremities, with exception of slight weakness with hip flexion on the right. Good symmetric motor tone is noted throughout.  Sensory: Sensory testing is intact to pinprick, soft touch, vibration sensation, and position sense on all 4 extremities.  No definite stocking pattern pinprick sensory deficit was seen on either side.  No evidence of extinction is noted.  Coordination: Cerebellar testing reveals good finger-nose-finger and heel-to-shin bilaterally.  Gait and station: Gait is somewhat slow and deliberate, the patient normally uses a cane for ambulation.  He does shuffle some with turns.  He does have some arm swing on the right with walking, no  tremor is seen while walking.  Romberg is negative.  The patient has difficulty walking on heels and the toes on both sides.  Reflexes: Deep tendon reflexes are symmetric and normal bilaterally, with exception of some relative reduction of the left ankle jerk reflex as compared to the right. Toes are downgoing bilaterally.   MRI lumbar 11/02/20:  At L3-4 there is mild retrolisthesis.  There is mild to moderate bilateral foraminal narrowing. At L4-5 there is grade 1 anterolisthesis with annular fissuring.  Severe bilateral facet arthropathy is seen.  There is moderate bilateral foraminal narrowing and moderate canal narrowing. At L5-S1, there is minimal diffuse disc bulging, no foraminal stenosis is seen.  No severe spinal stenosis is seen at any level.   Assessment/Plan:  1.  Parkinson's disease  2.  Gait disorder  3.  Low back pain  The patient indicates that he has had a significant worsening of back pain and difficulty with walking since an injection in his low back on 19 January 2021.  The patient will be placed on gabapentin starting at 100 mg 3 times daily and working up to 200 mg 3 times daily, he will call for any dose adjustments.  I will get him set up for physical therapy for neuromuscular therapy for his back and for gait training.  The patient is followed through the [place] for his Parkinson's disease, he has had a lot of difficulty tolerating Sinemet due to nausea, he may need to be restarted on this medication going up very slowly by 1/2 tablet increments.  He will follow-up here in 4 months.  The patient be set up for nerve conduction studies on both legs and EMG on the right leg.  Jill Alexanders MD 02/28/2021 10:14 AM  [facility] [address] [place], [place] [address]  Phone [phone] Fax [phone]

## 2021-02-28 NOTE — Telephone Encounter (Signed)
I have faxed to the service request form for physical therapy back to the [place].  Received a receipt of confirmation.  It may take 1 to 2 weeks for determination.  I called patient.  I discussed this with him.  Patient is agreeable to this plan.  I will let patient know when I receive a determination from the [place].

## 2021-02-28 NOTE — Telephone Encounter (Signed)
I called the [facility] at [phone]. Apparently, there is a Service Request form that needs to be completed and faxed with office notes to [phone] for a determination of physical therapy coverage.  I have completed this form and given it to Dr. [name] for review and signature.

## 2021-02-28 NOTE — Telephone Encounter (Signed)
I called [facility]. I spoke with [place]. Patient has VA benefits and he told them that he would like the [place] to cover his PT. Lequetta informed him that he will need to contact his representative at the [place] to get approval for PT at neuro rehab. If he would like to use his [facility] insurance instead, neuro rehab can use that without approval. Patient indicated that he would like the [place] to cover the therapy and therefore he will need to contact the [place].  I called patient. I had an extended conversation with him explaining this to him. He still would prefer to have PT with neuro rehab with the [place] covering it. I explained to him several times that [facility] needs him to call the [place] to get approval. The VA will fax neuro rehab an approval letter and then they can schedule him. He says that he will discuss this further with Dr. Alain Marion tomorrow before contacting the [place].

## 2021-02-28 NOTE — Patient Instructions (Signed)
We will start gabapentin for the low back pain.  Neurontin (gabapentin) may result in drowsiness, ankle swelling, gait instability, or possibly dizziness. Please contact our office if significant side effects occur with this medication.

## 2021-02-28 NOTE — Telephone Encounter (Signed)
Pt called stating he called the [facility] office and they informed him he needed approval from the [place]. Pt says he doesn't know what he should do. Requesting a call back.

## 2021-02-28 NOTE — Telephone Encounter (Signed)
Referral for neuro rehab sent to neuro rehab at [facility]  Phone: [phone].  I called patient.  I left him a detailed message on his cell phone, per DPR, advising him that he will need to call neuro rehab to schedule his appointments.  I gave him the phone number.  I asked him to call us back if he is any questions or concerns.

## 2021-03-01 ENCOUNTER — Encounter: Payer: Self-pay | Admitting: Internal Medicine

## 2021-03-01 ENCOUNTER — Ambulatory Visit (INDEPENDENT_AMBULATORY_CARE_PROVIDER_SITE_OTHER): Payer: Medicare Other | Admitting: Internal Medicine

## 2021-03-01 ENCOUNTER — Other Ambulatory Visit: Payer: Self-pay

## 2021-03-01 DIAGNOSIS — G20 Parkinson's disease: Secondary | ICD-10-CM | POA: Diagnosis not present

## 2021-03-01 DIAGNOSIS — R251 Tremor, unspecified: Secondary | ICD-10-CM | POA: Diagnosis not present

## 2021-03-01 DIAGNOSIS — I10 Essential (primary) hypertension: Secondary | ICD-10-CM | POA: Diagnosis not present

## 2021-03-01 DIAGNOSIS — R1310 Dysphagia, unspecified: Secondary | ICD-10-CM

## 2021-03-01 DIAGNOSIS — R29898 Other symptoms and signs involving the musculoskeletal system: Secondary | ICD-10-CM

## 2021-03-01 NOTE — Assessment & Plan Note (Addendum)
The pt saw Dr [name] on 02/28/21. The patient be set up for nerve conduction studies on both legs and EMG on the right leg Starting PT Rollator walker is suggested - pt refused Use Boost or Ensure, CoQ10

## 2021-03-01 NOTE — Assessment & Plan Note (Signed)
On NAS diet  

## 2021-03-01 NOTE — Patient Instructions (Signed)
Use Boost or Ensure

## 2021-03-01 NOTE — Assessment & Plan Note (Addendum)
The pt saw Dr [name] on 02/28/21. The patient be set up for nerve conduction studies on both legs and EMG on the right leg. He may need to re-start Sinemet Start PT Some issues w/swallowing - no wt loss Use Boost or Ensure

## 2021-03-01 NOTE — Progress Notes (Signed)
Subjective:  Patient ID: Lee [name], male    DOB: Jan 25, 1946  Age: 75 y.o. MRN: [id number]  CC: Follow-up (4 WEEK F/U)   HPI Lee [name] presents for leg weakness f/u: RLE weakness x 4 months, swallowing discomfort..."I'm worse every day"... 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 The pt saw Dr [name] on 02/28/21. The patient be set up for nerve conduction studies on both legs and EMG on the right leg   Outpatient Medications Prior to Visit  Medication Sig Dispense Refill   albuterol (PROVENTIL HFA;VENTOLIN HFA) 108 (90 Base) MCG/ACT inhaler Inhale 2 puffs into the lungs every 4 (four) hours as needed for wheezing or shortness of breath. 1 Inhaler 0   Capsaicin 0.1 % CREA APPLY THIN LAYER TO AFFECTED AREA TWICE A DAY AS NEEDED     carbidopa-levodopa (SINEMET IR) 25-100 MG tablet Take 1 tablet by mouth 3 (three) times daily.     CINNAMON PO TAKE 1 CAP/TAB BY MOUTH DAILY     dextromethorphan-guaiFENesin (ROBITUSSIN-DM) 10-100 MG/5ML liquid TAKE 2 TEASPOONFULS BY MOUTH EVERY 6 HOURS AS NEEDED FOR COUGH AND CONGESTION     diclofenac Sodium (VOLTAREN) 1 % GEL Apply topically.     Ergocalciferol (VITAMIN D2) 50 MCG (2000 UT) TABS Take by mouth.     FEROSUL 325 (65 Fe) MG tablet Take by mouth.     fluticasone (FLONASE) 50 MCG/ACT nasal spray INSTILL 1 SPRAY IN EACH NOSTRIL TWICE A DAY FOR DIZZINESS     folic acid (FOLVITE) 1 MG tablet Take 1 mg by mouth daily.     gabapentin (NEURONTIN) 100 MG capsule One capsule three time a day for 1 week, then take 2 capsules three times a day 180 capsule 3   Ginger, Zingiber officinalis, (GINGER PO) TAKE 1 CAP/TAB BY MOUTH DAILY     mometasone-formoterol (DULERA) 100-5 MCG/ACT AERO INHALE 2 INHALATIONS BY MOUTH TWICE A DAY (RINSE MOUTH WELL WITH WATER AFTER EACH USE) FOR BREATHING, COPD, ASTHMA.  REPLACES [place].     Multiple Vitamin  (QUINTABS) TABS Take by mouth.     mupirocin ointment (BACTROBAN) 2 % SMARTSIG:1 Application Topical 2-3 Times Daily     Omega-3 Fatty Acids (FISH OIL PO) Take by mouth.     oxybutynin (DITROPAN-XL) 10 MG 24 hr tablet Take 10 mg by mouth at bedtime.     pantoprazole (PROTONIX) 40 MG tablet Take by mouth.     rasagiline (AZILECT) 0.5 MG TABS tablet Take by mouth. Take 1 by mouth daily     tamsulosin (FLOMAX) 0.4 MG CAPS capsule Take 0.4 mg by mouth.     Tiotropium Bromide Monohydrate 2.5 MCG/ACT AERS Inhale 1 puff into the lungs daily.     Turmeric (QC TUMERIC COMPLEX PO) Take by mouth.     Turmeric POWD Take 5 mLs by mouth daily.     vitamin C (ASCORBIC ACID) 500 MG tablet Take by mouth.     No facility-administered medications prior to visit.    ROS: Review of Systems  Constitutional:  Positive for fatigue. Negative for appetite change and unexpected weight change.  HENT:  Negative for congestion, nosebleeds, sneezing, sore throat and trouble swallowing.   Eyes:  Negative for itching and visual disturbance.  Respiratory:  Negative for cough.   Cardiovascular:  Negative for chest pain, palpitations and leg swelling.  Gastrointestinal:  Negative for abdominal distention,  blood in stool, diarrhea and nausea.  Genitourinary:  Negative for frequency and hematuria.  Musculoskeletal:  Positive for back pain and gait problem. Negative for joint swelling and neck pain.  Skin:  Negative for rash.  Neurological:  Positive for weakness. Negative for dizziness, tremors and speech difficulty.  Psychiatric/Behavioral:  Negative for agitation, dysphoric mood and sleep disturbance. The patient is not nervous/anxious.    Objective:  BP 118/62 (BP Location: Left Arm)   Pulse 80   Temp 98.6 F (37 C) (Oral)   Ht 5' 10.5" (1.791 m)   Wt 152 lb 9.6 oz (69.2 kg)   SpO2 97%   BMI 21.59 kg/m   BP Readings from Last 3 Encounters:  03/01/21 118/62  02/28/21 113/72  02/01/21 120/68    Wt Readings  from Last 3 Encounters:  03/01/21 152 lb 9.6 oz (69.2 kg)  02/28/21 152 lb (68.9 kg)  02/01/21 151 lb 6.4 oz (68.7 kg)    Physical Exam Constitutional:      General: He is not in acute distress.    Appearance: Normal appearance. He is well-developed.     Comments: NAD  Eyes:     Conjunctiva/sclera: Conjunctivae normal.     Pupils: Pupils are equal, round, and reactive to light.  Neck:     Thyroid: No thyromegaly.     Vascular: No JVD.  Cardiovascular:     Rate and Rhythm: Normal rate and regular rhythm.     Heart sounds: Normal heart sounds. No murmur heard.   No friction rub. No gallop.  Pulmonary:     Effort: Pulmonary effort is normal. No respiratory distress.     Breath sounds: Normal breath sounds. No wheezing or rales.  Chest:     Chest wall: No tenderness.  Abdominal:     General: Bowel sounds are normal. There is no distension.     Palpations: Abdomen is soft. There is no mass.     Tenderness: There is no abdominal tenderness. There is no guarding or rebound.  Musculoskeletal:        General: Tenderness present. Normal range of motion.     Cervical back: Normal range of motion.  Lymphadenopathy:     Cervical: No cervical adenopathy.  Skin:    General: Skin is warm and dry.     Findings: No rash.  Neurological:     Mental Status: He is alert and oriented to person, place, and time.     Cranial Nerves: No cranial nerve deficit.     Motor: Weakness present. No abnormal muscle tone.     Coordination: Coordination abnormal.     Gait: Gait abnormal.     Deep Tendon Reflexes: Reflexes are normal and symmetric.  Psychiatric:        Behavior: Behavior normal.        Thought Content: Thought content normal.        Judgment: Judgment normal.  Ataxic gait Not steady Weak LEs Using a cane Talkative, upset  Lab Results  Component Value Date   WBC 3.4 (L) 02/01/2021   HGB 13.6 02/01/2021   HCT 39.8 02/01/2021   PLT 184.0 02/01/2021   GLUCOSE 95 02/01/2021   CHOL  225 (H) 02/01/2021   TRIG 56.0 02/01/2021   HDL 88.10 02/01/2021   LDLCALC 126 (H) 02/01/2021   ALT 14 02/01/2021   AST 18 02/01/2021   NA 140 02/01/2021   K 3.7 02/01/2021   CL 104 02/01/2021   CREATININE 1.02 02/01/2021  BUN 8 02/01/2021   CO2 29 02/01/2021   TSH 1.61 02/01/2021   PSA 5.09 (H) 02/01/2021    No results found.  Assessment & Plan:   There are no diagnoses linked to this encounter.   No orders of the defined types were placed in this encounter.    Follow-up: No follow-ups on file.  Walker Kehr, MD

## 2021-03-01 NOTE — Assessment & Plan Note (Signed)
Parkinson's

## 2021-03-01 NOTE — Assessment & Plan Note (Addendum)
Some issues w/swallowing - no wt loss On Protonix

## 2021-03-02 ENCOUNTER — Telehealth: Payer: Self-pay | Admitting: Neurology

## 2021-03-02 ENCOUNTER — Ambulatory Visit (INDEPENDENT_AMBULATORY_CARE_PROVIDER_SITE_OTHER): Payer: Medicare Other | Admitting: Diagnostic Neuroimaging

## 2021-03-02 ENCOUNTER — Encounter (INDEPENDENT_AMBULATORY_CARE_PROVIDER_SITE_OTHER): Payer: No Typology Code available for payment source | Admitting: Diagnostic Neuroimaging

## 2021-03-02 DIAGNOSIS — R29898 Other symptoms and signs involving the musculoskeletal system: Secondary | ICD-10-CM

## 2021-03-02 DIAGNOSIS — Z0289 Encounter for other administrative examinations: Secondary | ICD-10-CM

## 2021-03-02 NOTE — Procedures (Signed)
[facility]  NCS (NERVE CONDUCTION STUDY) WITH EMG (ELECTROMYOGRAPHY) REPORT   STUDY DATE: 03/02/21 PATIENT NAME: Lee [name] DOB: June 23, 1946 MRN: [id number]  ORDERING CLINICIAN: 03/02/21  TECHNOLOGIST: Sherre Scarlet ELECTROMYOGRAPHER: Earlean Polka. Morgane Joerger, MD  CLINICAL INFORMATION: 75 year old male with RIGHT low back pain and RIGHT leg weakness.  FINDINGS: NERVE CONDUCTION STUDY:  Bilateral tibial and right peroneal motor responses are normal.  Left peroneal motor response has normal distal latency, decreased amplitude and normal conduction velocity.  Bilateral sural and superficial peroneal sensory responses are normal.  Bilateral tibial F wave latencies are normal.    NEEDLE ELECTROMYOGRAPHY:  Needle examination of right lumbar paraspinal muscles and right lower extremity is normal except for occasional positive sharp waves in right gastrocnemius.  Normal motor unit recruitment and other muscles.   IMPRESSION:   This study demonstrates: -Normal nerve conduction studies and EMG of right lower extremity, except isolated occasional positive sharp waves in right gastrocnemius.  -Normal nerve conduction studies of left lower extremity, except decreased amplitude of left peroneal motor response.  Patient denies any numbness, pain or weakness of left lower extremity and this finding is of unclear clinical significance.   INTERPRETING PHYSICIAN:  Penni Bombard, MD Certified in Neurology, Neurophysiology and Neuroimaging  [facility] [address] [place], [place] [address] [phone]   [facility]    Nerve / Sites Muscle Latency Ref. Amplitude Ref. Rel Amp Segments Distance Velocity Ref. Area    ms ms mV mV %  cm m/s m/s mVms  R Peroneal - EDB     Ankle EDB 4.7 ?6.5 2.3 ?2.0 100 Ankle - EDB 9   6.3     Fib head EDB 11.7  2.0  86.9 Fib head - Ankle 31 44 ?44 6.0     Pop fossa EDB 14.0  2.0  98 Pop fossa - Fib head 10 44  ?44 6.0         Pop fossa - Ankle      L Peroneal - EDB     Ankle EDB 6.5 ?6.5 0.9 ?2.0 100 Ankle - EDB 9   3.3     Fib head EDB 11.8  0.8  90.7 Fib head - Ankle 30 57 ?44 1.8     Pop fossa EDB 13.7  0.7  84.4 Pop fossa - Fib head 10 52 ?44 1.3         Pop fossa - Ankle      R Tibial - AH     Ankle AH 3.3 ?5.8 5.7 ?4.0 100 Ankle - AH 9   13.0     Pop fossa AH 13.4  5.4  94.8 Pop fossa - Ankle 43 42 ?41 12.5  L Tibial - AH     Ankle AH 3.5 ?5.8 5.2 ?4.0 100 Ankle - AH 9   15.1     Pop fossa AH 12.8  4.3  83.8 Pop fossa - Ankle 43 46 ?41 17.3             SNC    Nerve / Sites Rec. Site Peak Lat Ref.  Amp Ref. Segments Distance    ms ms V V  cm  R Sural - Ankle (Calf)     Calf Ankle 3.3 ?4.4 7 ?6 Calf - Ankle 14  L Sural - Ankle (Calf)     Calf Ankle 3.4 ?4.4 6 ?6 Calf - Ankle 14  R Superficial peroneal - Ankle     Lat  leg Ankle 2.9 ?4.4 7 ?6 Lat leg - Ankle 10  L Superficial peroneal - Ankle     Lat leg Ankle 3.4 ?4.4 6 ?6 Lat leg - Ankle 14             F  Wave    Nerve F Lat Ref.   ms ms  R Tibial - AH 55.2 ?56.0  L Tibial - AH 55.0 ?56.0         EMG Summary Table    Spontaneous MUAP Recruitment  Muscle IA Fib PSW Fasc Other Amp Dur. Poly Pattern  R. Vastus medialis Normal None None None _______ Normal Normal Normal Normal  R. Tibialis anterior Normal None None None _______ Normal Normal Normal Normal  R. Gastrocnemius (Medial head) Normal None 1+ None _______ Normal Normal Normal Normal  R. Iliopsoas Normal None None None _______ Normal Normal Normal Normal  R. Gluteus medius Normal None None None _______ Normal Normal Normal Normal  R. Lumbar paraspinals Normal None None None _______ Normal Normal Normal Normal

## 2021-03-02 NOTE — Telephone Encounter (Signed)
I called the patient.  The EMG and nerve conduction study was relatively unremarkable.  No clear evidence of a lumbar radiculopathy.  The patient reports that his right leg does not do what he wanted to do at times, this may be related to Parkinson's disease.  He currently is not on the Sinemet, I have asked him to start increasing the Sinemet slowly going up by one half of a tablet of a 25/100 mg tablets, do this every 5 days until he takes 1/2 tablet 3 times a day.  He will go on gabapentin for pain.   EMG and NCV study 03/02/21:  IMPRESSION:    This study demonstrates: -Normal nerve conduction studies and EMG of right lower extremity, except isolated occasional positive sharp waves in right gastrocnemius. -Normal nerve conduction studies of left lower extremity, except decreased amplitude of left peroneal motor response.  Patient denies any numbness, pain or weakness of left lower extremity and this finding is of unclear clinical significance.

## 2021-03-13 NOTE — Telephone Encounter (Signed)
Although I received a receipt of confirmation at the [place] received the physical therapy service request form, when I called the [facility] today they cannot find it in their system.  I was given an email address for the nurse navigator for this patient and asked to email this form.  We should contact Andria Rhein that [phone] to check the status of this form within the next week.

## 2021-03-20 NOTE — Telephone Encounter (Signed)
I called Chelsea, community care nurse navigator for the [facility].  No answer, left a voicemail asking her to call me back regarding this patient's physical therapy service request form.

## 2021-03-20 NOTE — Telephone Encounter (Signed)
Chelsea, Community Care Nurse Navigator at [facility] returned my call.  She reports that the service request form for physical therapy was approved this morning by patient's primary care at the [place].  They have sent the faxed approval to neuro rehab.  I called patient.  I advised him of this.  I asked him to call neuro rehab at [phone] to schedule his appointments.  Patient verbalized understanding.

## 2021-03-23 ENCOUNTER — Ambulatory Visit (INDEPENDENT_AMBULATORY_CARE_PROVIDER_SITE_OTHER): Payer: No Typology Code available for payment source

## 2021-03-23 DIAGNOSIS — J439 Emphysema, unspecified: Secondary | ICD-10-CM

## 2021-03-23 DIAGNOSIS — Z0000 Encounter for general adult medical examination without abnormal findings: Secondary | ICD-10-CM

## 2021-03-23 NOTE — Progress Notes (Addendum)
I connected with Lee [name] today by telephone and verified that I am speaking with the correct person using two identifiers. Location patient: home Location provider: work Persons participating in the virtual visit: patient, provider.   I discussed the limitations, risks, security and privacy concerns of performing an evaluation and management service by telephone and the availability of in person appointments. I also discussed with the patient that there may be a patient responsible charge related to this service. The patient expressed understanding and verbally consented to this telephonic visit.    Interactive audio and video telecommunications were attempted between this provider and patient, however failed, due to patient having technical difficulties OR patient did not have access to video capability.  We continued and completed visit with audio only.  Some vital signs may be absent or patient reported.   Time Spent with patient on telephone encounter: 30 minutes  Subjective:   Lee [name] is a 75 y.o. male who presents for Medicare Annual/Subsequent preventive examination.  Review of Systems     Cardiac Risk Factors include: advanced age (>93mn, >>61women);family history of premature cardiovascular disease;hypertension;male gender     Objective:    There were no vitals filed for this visit. There is no height or weight on file to calculate BMI.  Advanced Directives 03/23/2021 09/21/2019 09/20/2016 06/02/2015  Does Patient Have a Medical Advance Directive? Yes No No No  Type of Advance Directive Living will;Healthcare Power of Attorney - - -  Does patient want to make changes to medical advance directive? No - Patient declined - - -  Copy of [place] Chart? No - copy requested - - -  Would patient like information on creating a medical advance directive? - No - Patient declined No - Patient declined -    Current Medications  (verified) Outpatient Encounter Medications as of 03/23/2021  Medication Sig   albuterol (PROVENTIL HFA;VENTOLIN HFA) 108 (90 Base) MCG/ACT inhaler Inhale 2 puffs into the lungs every 4 (four) hours as needed for wheezing or shortness of breath.   Capsaicin 0.1 % CREA APPLY THIN LAYER TO AFFECTED AREA TWICE A DAY AS NEEDED   carbidopa-levodopa (SINEMET IR) 25-100 MG tablet Take 1 tablet by mouth 3 (three) times daily.   CINNAMON PO TAKE 1 CAP/TAB BY MOUTH DAILY   dextromethorphan-guaiFENesin (ROBITUSSIN-DM) 10-100 MG/5ML liquid TAKE 2 TEASPOONFULS BY MOUTH EVERY 6 HOURS AS NEEDED FOR COUGH AND CONGESTION   diclofenac Sodium (VOLTAREN) 1 % GEL Apply topically.   Ergocalciferol (VITAMIN D2) 50 MCG (2000 UT) TABS Take by mouth.   FEROSUL 325 (65 Fe) MG tablet Take by mouth.   fluticasone (FLONASE) 50 MCG/ACT nasal spray INSTILL 1 SPRAY IN EACH NOSTRIL TWICE A DAY FOR DIZZINESS   folic acid (FOLVITE) 1 MG tablet Take 1 mg by mouth daily.   gabapentin (NEURONTIN) 100 MG capsule One capsule three time a day for 1 week, then take 2 capsules three times a day   Ginger, Zingiber officinalis, (GINGER PO) TAKE 1 CAP/TAB BY MOUTH DAILY   mometasone-formoterol (DULERA) 100-5 MCG/ACT AERO INHALE 2 INHALATIONS BY MOUTH TWICE A DAY (RINSE MOUTH WELL WITH WATER AFTER EACH USE) FOR BREATHING, COPD, ASTHMA.  REPLACES [place]   Multiple Vitamin (QUINTABS) TABS Take by mouth.   mupirocin ointment (BACTROBAN) 2 % SMARTSIG:1 Application Topical 2-3 Times Daily   Omega-3 Fatty Acids (FISH OIL PO) Take by mouth.   oxybutynin (DITROPAN-XL) 10 MG 24 hr tablet Take 10 mg by  mouth at bedtime.   pantoprazole (PROTONIX) 40 MG tablet Take by mouth.   rasagiline (AZILECT) 0.5 MG TABS tablet Take by mouth. Take 1 by mouth daily   tamsulosin (FLOMAX) 0.4 MG CAPS capsule Take 0.4 mg by mouth.   Tiotropium Bromide Monohydrate 2.5 MCG/ACT AERS Inhale 1 puff into the lungs daily.   Turmeric (QC TUMERIC COMPLEX PO)  Take by mouth.   Turmeric POWD Take 5 mLs by mouth daily.   vitamin C (ASCORBIC ACID) 500 MG tablet Take by mouth.   No facility-administered encounter medications on file as of 03/23/2021.    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[place])    PVD (peripheral vascular disease) ([place])    Tremor 06/02/2015   Past Surgical History:  Procedure Laterality Date   TONSILLECTOMY     VASECTOMY     Family History  Problem Relation Age of Onset   Cancer Brother 48       prostate ca   Irregular heart beat Mother    Irregular heart beat Father    Tremor Father    Cancer Sister    Social History   Socioeconomic History   Marital status: Single    Spouse name: Not on file   Number of children: 0   Years of education: 14   Highest education level: Not on file  Occupational History   Occupation: retired  Tobacco Use   Smoking status: Former    Types: Cigarettes    Quit date: 06/09/2005    Years since quitting: 15.7   Smokeless tobacco: Never  Substance and Sexual Activity   Alcohol use: No    Comment: quit 10 years ago   Drug use: No   Sexual activity: Yes  Other Topics Concern   Not on file  Social History Narrative   Patient does not drink caffeine.   Patient is right handed.    Social Determinants of Health   Financial Resource Strain: Low Risk    Difficulty of Paying Living Expenses: Not hard at all  Food Insecurity: No Food Insecurity   Worried About Charity fundraiser in the Last Year: Never true   [place] in the Last Year: Never  true  Transportation Needs: No Transportation Needs   Lack of Transportation (Medical): No   Lack of Transportation (Non-Medical): No  Physical Activity: Inactive   Days of Exercise per Week: 0 days   Minutes of Exercise per Session: 0 min  Stress: No Stress Concern Present   Feeling of Stress : Not at all  Social Connections: Moderately Integrated   Frequency of Communication with Friends and Family: More than three times a week   Frequency of Social Gatherings with Friends and Family: Once a week   Attends Religious Services: More than 4 times per year   Active Member of [employer] or Organizations: Yes   Attends Music therapist: More than 4 times per year   Marital Status: Divorced    Tobacco Counseling  Counseling given: Not Answered   Clinical Intake:  Pre-visit preparation completed: Yes  Pain : No/denies pain     Nutritional Risks: None Diabetes: No  How often do you need to have someone help you when you read instructions, pamphlets, or other written materials from your doctor or pharmacy?: 1 - Never What is the last grade level you completed in school?: 2 years of [employer]  Diabetic? no  Interpreter Needed?: No  Information entered by :: Lisette Abu, LPN   Activities of Daily Living In your present state of health, do you have any difficulty performing the following activities: 03/23/2021  Hearing? N  Vision? N  Difficulty concentrating or making decisions? N  Walking or climbing stairs? N  Dressing or bathing? N  Doing errands, shopping? N  Preparing Food and eating ? N  Using the Toilet? N  In the past six months, have you accidently leaked urine? N  Do you have problems with loss of bowel control? N  Managing your Medications? N  Managing your Finances? N  Housekeeping or managing your Housekeeping? N  Some recent data might be hidden    Patient Care Team: [name], [name] Lacks, MD as PCP - General (Internal  Medicine) Armour, Azalia Bilis, MD as Referring Physician (Internal Medicine) Rush Farmer, MD (Pulmonary Disease) Joelene Millin PA-C as Physician Assistant (Orthopedic Surgery) Webb Laws, [place] as Referring Physician (Optometry)  Indicate any recent Medical Services you may have received from other than [facility] providers in the past year (date may be approximate).     Assessment:   This is a routine wellness examination for Lee [name].  Hearing/Vision screen Hearing Screening - Comments:: Patient denied any hearing difficulty.  No hearing aids needed. Vision Screening - Comments:: Patient wears glasses.  Annual eye exam done by Dr. Webb Laws.  Dietary issues and exercise activities discussed: Current Exercise Habits: The patient does not participate in regular exercise at present, Exercise limited by: neurologic condition(s);respiratory conditions(s);cardiac condition(s);orthopedic condition(s)   Goals Addressed   None   Depression Screen PHQ 2/9 Scores 03/23/2021 02/01/2021 09/21/2019 09/21/2019 06/04/2019 08/27/2017  PHQ - 2 Score 0 0 0 0 - 3  PHQ- 9 Score 0 0 0 - - 7  Exception Documentation - - - - Medical reason -    Fall Risk Fall Risk  03/23/2021 02/01/2021 09/21/2019 06/04/2019 08/27/2017  Falls in the past year? '1 1 1 1 '$ No  Number falls in past yr: 1 0 0 1 -  Injury with Fall? 0 '1 1 1 '$ -  Comment - - has history of vertigo and uses a can at times. - -  Risk for fall due to : History of fall(s);Impaired mobility;Impaired balance/gait;Orthopedic patient Impaired balance/gait;Impaired mobility;Orthopedic patient;History of fall(s) - - -  Follow up Falls evaluation completed - - Falls evaluation completed -    FALL RISK PREVENTION PERTAINING TO THE HOME:  Any stairs in or around the home? No  If so, are there any without handrails? No  Home free of loose throw rugs in walkways, pet beds, electrical cords, etc? Yes  Adequate lighting in your home to reduce risk of  falls? Yes   ASSISTIVE DEVICES UTILIZED TO PREVENT FALLS:  Life alert? No  Use of a cane, walker or w/c? No  Grab bars in the bathroom? Yes  Shower chair or bench in shower? No  Elevated toilet seat or a handicapped toilet? No   TIMED UP AND GO:  Was the test performed? No .  Length of time to ambulate 10 feet: n/a sec.   Appearance of Gait: patient stated that he tremors a lot and refuses to use a cane.  Cognitive Function: No flowsheet data found.         Immunizations Immunization History  Administered Date(s) Administered   Fluad Quad(high Dose 65+) 04/29/2020, 05/10/2020   Influenza Split 05/31/2015   Influenza Whole 07/25/2007   Influenza, High Dose Seasonal PF 03/30/2013, 04/15/2013, 04/29/2017, 04/23/2018, 04/27/2019, 05/21/2019   Influenza,inj,Quad PF,6+ Mos 05/28/2016   Influenza-Unspecified 05/31/2007, 06/18/2008, 05/16/2009, 04/11/2010, 04/29/2010, 07/27/2011, 05/15/2012, 05/12/2014   Moderna Sars-Covid-2 Vaccination 08/25/2019, 09/22/2019, 06/24/2020, 12/08/2020   Pneumococcal Conjugate-13 06/29/2014   Pneumococcal Polysaccharide-23 05/28/2013   Pneumococcal-Unspecified 05/06/2007   Rabies, IM 04/20/2020   Tdap 09/07/2011   Zoster Recombinat (Shingrix) 12/11/2019, 02/16/2020    TDAP status: Up to date  Flu Vaccine status: Up to date  Pneumococcal vaccine status: Up to date  Covid-19 vaccine status: Completed vaccines  Qualifies for Shingles Vaccine? Yes   Zostavax completed No   Shingrix Completed?: Yes  Screening Tests Health Maintenance  Topic Date Due   Hepatitis C Screening  Never done   COLONOSCOPY (Pts 45-72yr Insurance coverage will need to be confirmed)  07/22/2012   INFLUENZA VACCINE  02/27/2021   COVID-19 Vaccine (5 - Booster for Moderna series) 04/10/2021   TETANUS/TDAP  09/06/2021   PNA vac Low Risk Adult  Completed   Zoster Vaccines- Shingrix  Completed   HPV VACCINES  Aged Out    Health Maintenance  Health Maintenance Due   Topic Date Due   Hepatitis C Screening  Never done   COLONOSCOPY (Pts 45-430yrInsurance coverage will need to be confirmed)  07/22/2012   INFLUENZA VACCINE  02/27/2021    Colorectal cancer screening: Type of screening: Colonoscopy. Completed 07/22/2002. Repeat every 10 years  Lung Cancer Screening: (Low Dose CT Chest recommended if Age 74-80ears, 30 pack-year currently smoking OR have quit w/in 15years.) does not qualify.   Lung Cancer Screening Referral: no  Additional Screening:  Hepatitis C Screening: does qualify; Completed no  Vision Screening: Recommended annual ophthalmology exams for early detection of glaucoma and other disorders of the eye. Is the patient up to date with their annual eye exam?  Yes  Who is the provider or what is the name of the office in which the patient attends annual eye exams? Dr. HoWebb Lawsf pt is not established with a provider, would they like to be referred to a provider to establish care? No .   Dental Screening: Recommended annual dental exams for proper oral hygiene  Community Resource Referral / Chronic Care Management: CRR required this visit?  No   CCM required this visit?  No      Plan:     I have personally reviewed and noted the following in the patient's chart:   Medical and social history Use of alcohol, tobacco or illicit drugs  Current medications and supplements including opioid prescriptions. Patient is not currently taking opioid prescriptions. Functional ability and status Nutritional status Physical activity Advanced directives List of other physicians Hospitalizations, surgeries, and ER visits in previous 12 months Vitals Screenings to include cognitive, depression, and falls Referrals and appointments  In addition, I have reviewed and discussed with patient certain preventive protocols, quality metrics, and best practice recommendations. A written personalized care plan for preventive services as well  as general preventive health recommendations were provided to patient.     ShSheral FlowLPN  03/23/2021   Nurse Notes:  Patient is cogitatively intact. There were no vitals filed for this visit. There is no height or weight on file to calculate BMI. Hearing Screening - Comments:: Patient denied any hearing difficulty.  No hearing aids needed. Vision Screening - Comments:: Patient wears glasses.  Annual eye exam done by Dr. Webb Laws. The patient has a history of falls. I did complete a risk assessment for falls. A plan of care for falls was documented.  Medical screening examination/treatment/procedure(s) were performed by non-physician practitioner and as supervising physician I was immediately available for consultation/collaboration.  I agree with above. Lew Dawes, MD

## 2021-03-23 NOTE — Patient Instructions (Signed)
[name] , Thank you for taking time to come for your Medicare Wellness Visit. I appreciate your ongoing commitment to your health goals. Please review the following plan we discussed and let me know if I can assist you in the future.   Screening recommendations/referrals: Colonoscopy: last done 07/22/2002; repeat in 10 years Recommended yearly ophthalmology/optometry visit for glaucoma screening and checkup Recommended yearly dental visit for hygiene and checkup  Vaccinations: Influenza vaccine: 05/10/2020 Pneumococcal vaccine: 05/28/2013, 06/29/2014 Tdap vaccine: 09/07/2011; due every 10 years Shingles vaccine: 12/11/2019, 02/16/2020   Covid-19: 08/25/2019, 09/22/2019, 06/24/2020, 12/08/2020  Advanced directives: Please bring a copy of your health care power of attorney and living will to the office at your convenience.  Conditions/risks identified: Yes; Client understands the importance of follow-up with providers by attending scheduled visits and discussed goals to eat healthier, increase physical activity, exercise the brain, socialize more, get enough sleep and make time for laughter.  Next appointment: 03/26/2022 at 3:40p televisit.  Preventive Care 74 Years and Older, Male Preventive care refers to lifestyle choices and visits with your health care provider that can promote health and wellness. What does preventive care include? A yearly physical exam. This is also called an annual well check. Dental exams once or twice a year. Routine eye exams. Ask your health care provider how often you should have your eyes checked. Personal lifestyle choices, including: Daily care of your teeth and gums. Regular physical activity. Eating a healthy diet. Avoiding tobacco and drug use. Limiting alcohol use. Practicing safe sex. Taking low doses of aspirin every day. Taking vitamin and mineral supplements as recommended by your health care provider. What happens during an annual well  check? The services and screenings done by your health care provider during your annual well check will depend on your age, overall health, lifestyle risk factors, and family history of disease. Counseling  Your health care provider may ask you questions about your: Alcohol use. Tobacco use. Drug use. Emotional well-being. Home and relationship well-being. Sexual activity. Eating habits. History of falls. Memory and ability to understand (cognition). Work and work Statistician. Screening  You may have the following tests or measurements: Height, weight, and BMI. Blood pressure. Lipid and cholesterol levels. These may be checked every 5 years, or more frequently if you are over 13 years old. Skin check. Lung cancer screening. You may have this screening every year starting at age 74 if you have a 30-pack-year history of smoking and currently smoke or have quit within the past 15 years. Fecal occult blood test (FOBT) of the stool. You may have this test every year starting at age 74. Flexible sigmoidoscopy or colonoscopy. You may have a sigmoidoscopy every 5 years or a colonoscopy every 10 years starting at age 74. Prostate cancer screening. Recommendations will vary depending on your family history and other risks. Hepatitis C blood test. Hepatitis B blood test. Sexually transmitted disease (STD) testing. Diabetes screening. This is done by checking your blood sugar (glucose) after you have not eaten for a while (fasting). You may have this done every 1-3 years. Abdominal aortic aneurysm (AAA) screening. You may need this if you are a current or former smoker. Osteoporosis. You may be screened starting at age 74 if you are at high risk. Talk with your health care provider about your test results, treatment options, and if necessary, the need for more tests. Vaccines  Your health care provider may recommend certain vaccines, such as: Influenza vaccine. This is recommended every  year. Tetanus, diphtheria, and acellular pertussis (Tdap, Td) vaccine. You may need a Td booster every 10 years. Zoster vaccine. You may need this after age 74. Pneumococcal 13-valent conjugate (PCV13) vaccine. One dose is recommended after age 74. Pneumococcal polysaccharide (PPSV23) vaccine. One dose is recommended after age 74. Talk to your health care provider about which screenings and vaccines you need and how often you need them. This information is not intended to replace advice given to you by your health care provider. Make sure you discuss any questions you have with your health care provider. Document Released: 08/12/2015 Document Revised: 04/04/2016 Document Reviewed: 05/17/2015 Elsevier Interactive Patient Education  2017 [employer] Prevention in the Home Falls can cause injuries. They can happen to people of all ages. There are many things you can do to make your home safe and to help prevent falls. What can I do on the outside of my home? Regularly fix the edges of walkways and driveways and fix any cracks. Remove anything that might make you trip as you walk through a door, such as a raised step or threshold. Trim any bushes or trees on the path to your home. Use bright outdoor lighting. Clear any walking paths of anything that might make someone trip, such as rocks or tools. Regularly check to see if handrails are loose or broken. Make sure that both sides of any steps have handrails. Any raised decks and porches should have guardrails on the edges. Have any leaves, snow, or ice cleared regularly. Use sand or salt on walking paths during winter. Clean up any spills in your garage right away. This includes oil or grease spills. What can I do in the bathroom? Use night lights. Install grab bars by the toilet and in the tub and shower. Do not use towel bars as grab bars. Use non-skid mats or decals in the tub or shower. If you need to sit down in the shower, use a  plastic, non-slip stool. Keep the floor dry. Clean up any water that spills on the floor as soon as it happens. Remove soap buildup in the tub or shower regularly. Attach bath mats securely with double-sided non-slip rug tape. Do not have throw rugs and other things on the floor that can make you trip. What can I do in the bedroom? Use night lights. Make sure that you have a light by your bed that is easy to reach. Do not use any sheets or blankets that are too big for your bed. They should not hang down onto the floor. Have a firm chair that has side arms. You can use this for support while you get dressed. Do not have throw rugs and other things on the floor that can make you trip. What can I do in the kitchen? Clean up any spills right away. Avoid walking on wet floors. Keep items that you use a lot in easy-to-reach places. If you need to reach something above you, use a strong step stool that has a grab bar. Keep electrical cords out of the way. Do not use floor polish or wax that makes floors slippery. If you must use wax, use non-skid floor wax. Do not have throw rugs and other things on the floor that can make you trip. What can I do with my stairs? Do not leave any items on the stairs. Make sure that there are handrails on both sides of the stairs and use them. Fix handrails that are broken or loose.  Make sure that handrails are as long as the stairways. Check any carpeting to make sure that it is firmly attached to the stairs. Fix any carpet that is loose or worn. Avoid having throw rugs at the top or bottom of the stairs. If you do have throw rugs, attach them to the floor with carpet tape. Make sure that you have a light switch at the top of the stairs and the bottom of the stairs. If you do not have them, ask someone to add them for you. What else can I do to help prevent falls? Wear shoes that: Do not have high heels. Have rubber bottoms. Are comfortable and fit you  well. Are closed at the toe. Do not wear sandals. If you use a stepladder: Make sure that it is fully opened. Do not climb a closed stepladder. Make sure that both sides of the stepladder are locked into place. Ask someone to hold it for you, if possible. Clearly mark and make sure that you can see: Any grab bars or handrails. First and last steps. Where the edge of each step is. Use tools that help you move around (mobility aids) if they are needed. These include: Canes. Walkers. Scooters. Crutches. Turn on the lights when you go into a dark area. Replace any light bulbs as soon as they burn out. Set up your furniture so you have a clear path. Avoid moving your furniture around. If any of your floors are uneven, fix them. If there are any pets around you, be aware of where they are. Review your medicines with your doctor. Some medicines can make you feel dizzy. This can increase your chance of falling. Ask your doctor what other things that you can do to help prevent falls. This information is not intended to replace advice given to you by your health care provider. Make sure you discuss any questions you have with your health care provider. Document Released: 05/12/2009 Document Revised: 12/22/2015 Document Reviewed: 08/20/2014 Elsevier Interactive Patient Education  2017 [employer].

## 2021-03-24 ENCOUNTER — Encounter: Payer: Self-pay | Admitting: Physical Therapy

## 2021-03-24 ENCOUNTER — Other Ambulatory Visit: Payer: Self-pay

## 2021-03-24 ENCOUNTER — Ambulatory Visit: Payer: No Typology Code available for payment source | Attending: Family Medicine | Admitting: Physical Therapy

## 2021-03-24 DIAGNOSIS — R2689 Other abnormalities of gait and mobility: Secondary | ICD-10-CM | POA: Diagnosis not present

## 2021-03-24 DIAGNOSIS — M6281 Muscle weakness (generalized): Secondary | ICD-10-CM | POA: Diagnosis present

## 2021-03-24 DIAGNOSIS — R2681 Unsteadiness on feet: Secondary | ICD-10-CM | POA: Diagnosis present

## 2021-03-24 DIAGNOSIS — R293 Abnormal posture: Secondary | ICD-10-CM

## 2021-03-24 NOTE — Therapy (Signed)
[facility] [address] [place], [place], [address] Phone: [phone]   Fax:  [phone]  Physical Therapy Treatment  Patient Details  Name: Lee [name] MRN: [id number] Date of Birth: 03/10/1946 Referring Provider (PT): Landry Mellow, MD ([facility])   Encounter Date: 03/24/2021   PT End of Session - 03/24/21 1019     Visit Number 1    Number of Visits 15    Date for PT Re-Evaluation 05/12/21    Authorization Type VA approved 15 visits    Authorization - Visit Number 1    Authorization - Number of Visits 15    PT Start Time 1019    PT Stop Time 1102    PT Time Calculation (min) 43 min    Activity Tolerance Patient tolerated treatment well    Behavior During Therapy [name] for tasks assessed/performed             Past Medical History:  Diagnosis Date   Acid reflux    Alcohol abuse    quit 10 y ago   Arthritis    BPH (benign prostatic hyperplasia)    CAD (coronary artery disease)    Cervical spondylosis without myelopathy 06/02/2015   COPD (chronic obstructive pulmonary disease) ([place])    Emphysema lung ([place])    GERD (gastroesophageal reflux disease)    High cholesterol    Hypertension    Parkinson's disease ([place])    PVD (peripheral vascular disease) ([place])    Tremor 06/02/2015    Past Surgical History:  Procedure Laterality Date   TONSILLECTOMY     VASECTOMY      There were no vitals filed for this visit.   Subjective Assessment - 03/24/21 1025     Subjective Started going to the doctor about my R leg giving way.  Gave me a shot in my back and that made it worse.  Feel like my walking and balance is worse.  I am stumbling and I usually catch myself from falling.  As the day goes on, I have a hard time moving the R leg.    Pertinent History PMH:  neck and back pain, PD -dx in March 2022 per pt report.  (just started back on Sinemet), hx of ETOH abuse, CAD, COPD, emphysema, GERD, HTN, high  cholesterol, PVD    How long can you walk comfortably? just walking around the house    Patient Stated Goals Pt's goals for therapy to get rid of the pain and move better.    Currently in Pain? Yes    Pain Score 5     Pain Location Back    Pain Orientation Left;Lower    Pain Descriptors / Indicators Aching    Pain Type Chronic pain   2-3 monhts   Pain Onset More than a month ago    Pain Frequency Constant    Aggravating Factors  certain movements    Pain Relieving Factors hot water in tub    Effect of Pain on Daily Activities PT will monitor, attempt to address through execises                [name] PT Assessment - 03/24/21 1035       Assessment   Medical Diagnosis Parkinson's, gait instability, low back pain    Referring Provider (PT) Landry Mellow, MD ([facility])    Onset Date/Surgical Date 03/20/21   PT referral   Hand Dominance Right      Precautions   Precautions Fall  Balance Screen   Has the patient fallen in the past 6 months Yes    How many times? 1    Has the patient had a decrease in activity level because of a fear of falling?  Yes    Is the patient reluctant to leave their home because of a fear of falling?  Yes      [name] Private residence    Living Arrangements Alone    Available Help at Discharge Friend(s);Family    Type of [place] to enter    Entrance Stairs-Number of Steps 3    Entrance Stairs-Rails [name] One level    [name] - single point      Prior Function   Level of [place] Retired    Leisure Enjoyed walking at the park; has always been an avid exerciser      Observation/Other Assessments   Focus on Therapeutic Outcomes (FOTO)  NA      ROM / Strength   AROM / PROM / Strength AROM;Strength      AROM   Overall AROM  Within functional limits for tasks performed      Strength   Overall Strength Deficits    Overall  Strength Comments Grossly tested 4/5 through R hip flexion, then 4/5 R hamstrings, 4/5 R quads, 5/5 ankle dorsiflexion; 5/5 throughout LLE      Transfers   Transfers Sit to Stand;Stand to Sit    Sit to Stand 6: Modified independent (Device/Increase time);Without upper extremity assist;From chair/3-in-1    Five time sit to stand comments  17.93    Stand to Sit 6: Modified independent (Device/Increase time);Without upper extremity assist;To chair/3-in-1      Ambulation/Gait   Ambulation/Gait Yes    Ambulation/Gait Assistance 5: Supervision;6: Modified independent (Device/Increase time)    Ambulation Distance (Feet) 80 Feet    Assistive device Straight cane    Gait Pattern Step-through pattern;Decreased step length - right;Decreased step length - left;Shuffle;Festinating;Narrow base of support    Ambulation Surface Level;Indoor    Gait velocity 18.63 sec = 1.76 ft/sec      Standardized Balance Assessment   Standardized Balance Assessment Timed Up and Go Test      Timed Up and Go Test   Normal TUG (seconds) 19.91    TUG Comments Scores >13.5 sec indicates increased fall risk.                                   PT Education - 03/24/21 1424     Education Details PT eval results, POC; discussed Parkinson's disease in relation to pt's difficulty moving RLE (it appears to be festination/freezing with RLE); discussed medication timing and need to f/u with Dr. [name], as pt is having more difficulty moving later in the day.  Discussed use of large amplitude movement patterns to address with exercises in therapy    Person(s) Educated Patient    Methods Explanation    Comprehension Verbalized understanding              PT Short Term Goals - 03/24/21 1435       PT SHORT TERM GOAL #1   Title Pt will be independent with HEP for improved strength, balance, transfers, and gait.  TARGET 04/14/2021    Time 4    Period  Weeks    Status New      PT SHORT TERM GOAL #2    Title Pt will verbalize tips to reduce festination/freezing with gait and turns.    Time 4    Period Weeks    Status New      PT SHORT TERM GOAL #3   Title Pt will improve 5x sit<>stand to less than or equal to 15 sec to demonstrate improved functional strength and transfer efficiency.    Baseline 17.93 sec    Time 4    Period Weeks    Status New      PT SHORT TERM GOAL #4   Title Pt will improve TUG score to less than or equal to 15 sec for decreased fall risk    Baseline 19.91 sec    Time 4    Period Weeks    Status New      PT SHORT TERM GOAL #5   Title Pt will verbalize understanding of local Parkinson's disease resources.    Time 4    Period Weeks    Status New               PT Long Term Goals - 03/24/21 1437       PT LONG TERM GOAL #1   Title Pt will be independent with progression of HEP for improved strength, balance, decreased pain, transfers, and gait.  TARGET 05/12/21    Time 8    Period Weeks    Status New      PT LONG TERM GOAL #2   Title Pt will improve gait velocity to at least 2.2 ft/sec for improved gait efficiency and safety.    Baseline 1.76 ft/sec    Time 8    Period Weeks    Status New      PT LONG TERM GOAL #3   Title Pt will improve 5x sit<>stand to less than or equal to 12.6 sec to demonstrate improved functional strength and transfer efficiency.    Time 8    Period Weeks    Status New      PT LONG TERM GOAL #4   Title Pt will improve TUG score to less than or equal to 13.5 sec for decreased fall risk.    Time 8    Period Weeks    Status New      PT LONG TERM GOAL #5   Title Pt will ambulate at least 1000 ft indoor and outdoor surfaces, appropriate assistive device, mod independently, for imrpoved community gait.    Time 8    Period Weeks    Status New                   Plan - 03/24/21 1429     Clinical Impression Statement Pt is a 75 year old male who presents to OPPT with hx of Parkinson's disease and back  pain with increasing difficulty with RLE weakness and difficulty moving in the past 6 months.  He presents with bradykinesia, decreased timing and coordiantion of gait, decreased functional strength, decreased balance.  He is at fall risk per TUG score of 19.91 sec and gait velocity of 1.76 ft/sec.  He has had one fall in past 6 months.  Prior to this time, he was walking in the park several times per week at least 20 minutes; now he is only walking around his home with use of cane.  He has just had medication changes with local neurologist (Dr.  Willis) adding back in his Sinemet medication.  He will benefit from skilled PT to address the above stated deficits to decrease fall risk and improve overall functional mobility.    Personal Factors and Comorbidities Comorbidity 3+    Comorbidities PMH:  neck and back pain, PD (no Sinemet), hx of ETOH abuse, CAD, COPD, emphysema, GERD, HTN, high cholesterol, PVD    Examination-Activity Limitations Locomotion Level;Transfers;Stand    Examination-Participation Restrictions Community Activity;Other   community fitness   Stability/Clinical Decision Making Evolving/Moderate complexity    Clinical Decision Making Moderate    Rehab Potential Good    PT Frequency 2x / week    PT Duration --   7 weeks, plus eval visit; total 8 weeks, including eval wee   PT Treatment/Interventions ADLs/Self Care Home Management;Gait training;Stair training;Functional mobility training;Therapeutic activities;Therapeutic exercise;Balance training;Neuromuscular re-education;DME Instruction;Manual techniques;Patient/family education;Passive range of motion    PT Next Visit Plan Initiate HEP to address large amplitude movement patterns-likely seated PWR! Moves, also working on education regarding PD (check with Dr. [name] about medication timing), tips to reduce festination with gait and turns    Consulted and Agree with Plan of Care Patient             Patient will benefit from  skilled therapeutic intervention in order to improve the following deficits and impairments:  Abnormal gait, Difficulty walking, Decreased balance, Decreased mobility, Decreased strength, Pain, Postural dysfunction (stiffness noted through trunk)  Visit Diagnosis: Other abnormalities of gait and mobility  Unsteadiness on feet  Muscle weakness (generalized)  Abnormal posture     Problem List Patient Active Problem List   Diagnosis Date Noted   Parkinson's disease ([place]) 02/28/2021   Weakness of both legs 02/01/2021   Dysphagia 02/01/2021   Prostate cancer ([place]) 06/09/2020   Well adult exam 06/04/2019   BPH (benign prostatic hyperplasia) 06/04/2019   Bilateral carotid bruits 06/04/2019   Meniere disease, 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   Emphysema of lung ([place]) 06/04/2019   Spondylolisthesis at L4-L5 level 08/27/2017   Spinal stenosis in cervical region 08/27/2017   Tremor 06/02/2015   Cervical spondylosis without myelopathy 06/02/2015   Alcoholism in remission ([place]) 06/09/2014   HIP PAIN 11/27/2007   Essential hypertension 07/25/2007   Atrial fibrillation ([place]) 07/25/2007   GERD 07/25/2007   ERECTILE DYSFUNCTION 07/25/2007   LOW BACK PAIN 07/25/2007    Kellen Dutch W. 03/24/2021, 2:44 PM [facility] [address] [place] [address] [place], [place], [address] Phone: [phone]   Fax:  [phone]  Name: Lee [name] MRN: [id number] Date of Birth: 1946-04-16

## 2021-03-25 ENCOUNTER — Telehealth: Payer: Self-pay | Admitting: Neurology

## 2021-03-25 NOTE — Telephone Encounter (Signed)
I called the patient and left a message.  The physical therapist indicated that the patient was taking the Sinemet, but was having increased problems with mobility later in the day.  [facility] has been treating him for Parkinson's disease.  If he is having issues with wearing off of the medication, and he is on the medication and tolerating the drug, he will call our office and we will be happy to instruct him as to making dose adjustments of the medication.  If he is wanting to be followed through the [place] for the Parkinson's disease, he needs to contact them regarding dose adjustments.

## 2021-03-27 ENCOUNTER — Telehealth: Payer: Self-pay | Admitting: Lab

## 2021-03-27 MED ORDER — CARBIDOPA-LEVODOPA 25-100 MG PO TABS: ORAL_TABLET | ORAL | 1 refills | Status: DC

## 2021-03-27 NOTE — Addendum Note (Signed)
Addended by: Kathrynn Ducking on: 03/27/2021 12:07 PM   Modules accepted: Orders

## 2021-03-27 NOTE — Chronic Care Management (AMB) (Signed)
  Chronic Care Management   Note  03/27/2021 Name: Lee [name] MRN: [id number] DOB: 1946/05/06  Lee [name] is a 75 y.o. year old male who is a primary care patient of [name], [name] Lacks, MD. I reached out to [name] by phone today in response to a referral sent by Lee [name] PCP, [name], [name] Lacks, MD.   Lee [name] was given information about Chronic Care Management services today including:  CCM service includes personalized support from designated clinical staff supervised by his physician, including individualized plan of care and coordination with other care providers 24/7 contact phone numbers for assistance for urgent and routine care needs. Service will only be billed when office clinical staff spend 20 minutes or more in a month to coordinate care. Only one practitioner may furnish and bill the service in a calendar month. The patient may stop CCM services at any time (effective at the end of the month) by phone call to the office staff.   Patient agreed to services and verbal consent obtained.   Follow up plan:   [place]

## 2021-03-27 NOTE — Telephone Encounter (Signed)
Pt returned phone call, would like a call back.  

## 2021-03-27 NOTE — Telephone Encounter (Signed)
I called the patient.  The patient continues to have troubles with functioning in the afternoon.  He is on a low-dose of Sinemet, he has had a DaTscan previously that was consistent with Parkinson's disease.  He takes the 25/100 mg tablet of Sinemet 3 times daily.  He does not believe that this dose has helped him.  I would go up on the dose before we decide whether or not the medication is helpful.  He is to take 1.5 tablets 3 times daily for 1 month and then go to 2 tablets 3 times daily, I have sent in a prescription for him.

## 2021-03-28 ENCOUNTER — Other Ambulatory Visit: Payer: Self-pay

## 2021-03-28 ENCOUNTER — Encounter: Payer: Self-pay | Admitting: Physical Therapy

## 2021-03-28 ENCOUNTER — Ambulatory Visit: Payer: No Typology Code available for payment source | Admitting: Physical Therapy

## 2021-03-28 DIAGNOSIS — M6281 Muscle weakness (generalized): Secondary | ICD-10-CM

## 2021-03-28 DIAGNOSIS — R293 Abnormal posture: Secondary | ICD-10-CM

## 2021-03-28 DIAGNOSIS — R2689 Other abnormalities of gait and mobility: Secondary | ICD-10-CM | POA: Diagnosis not present

## 2021-03-28 DIAGNOSIS — R2681 Unsteadiness on feet: Secondary | ICD-10-CM

## 2021-03-28 NOTE — Therapy (Signed)
[facility] [address] [place], [place], [address] Phone: [phone]   Fax:  [phone]  Physical Therapy Treatment  Patient Details  Name: Lee [name] MRN: [id number] Date of Birth: [birth year] Referring Provider (PT): Landry Mellow, MD ([facility]   Encounter Date: 03/28/2021   PT End of Session - 03/28/21 1943     Visit Number 2    Number of Visits 15    Date for PT Re-Evaluation 05/12/21    Authorization Type [facility] approved 15 visits    Authorization - Visit Number 2    Authorization - Number of Visits 15    PT Start Time 1532    PT Stop Time [id number]    PT Time Calculation (min) 42 min    Activity Tolerance Patient tolerated treatment well    Behavior During Therapy [facility] for tasks assessed/performed             Past Medical History:  Diagnosis Date   Acid reflux    Alcohol abuse    quit 10 y ago   Arthritis    BPH (benign prostatic hyperplasia)    CAD (coronary artery disease)    Cervical spondylosis without myelopathy 06/02/2015   COPD (chronic obstructive pulmonary disease) ([place])    Emphysema lung (H[facility])    GERD (gastroesophageal reflux disease)    High cholesterol    Hypertension    Parkinson's disease ([place])    PVD (peripheral vascular disease) ([place])    Tremor 06/02/2015    Past Surgical History:  Procedure Laterality Date   TONSILLECTOMY     VASECTOMY      There were no vitals filed for this visit.   Subjective Assessment - 03/28/21 1543     Subjective Has talked to Dr. [name] and Dr. [name] is going to increase the dose of his sinemet. Pt reports that [name]. No falls.Always stumbling.    Pertinent History PMH:  neck and back pain, PD -dx in March 2022 per pt report.  (just started back on Sinemet), hx of ETOH abuse, CAD, COPD, emphysema, GERD, HTN, high cholesterol, PVD    How long can you walk comfortably?  just walking around the house    Patient Stated Goals Pt's goals for therapy to get rid of the pain and move better.    Currently in Pain? No/denies    Pain Onset More than a month ago                            Pt performs PWR! Moves in sitting position:    PWR! Up for improved posture 2 x 10 reps, cues for scap retraction and opening B hands when performing   PWR! Rock for improved weighshifting x5 reps bilat - with reaching across body (initially tried with reaching up but pt reporting incr shoulder pain when performing), then an additional 2 x 10 reps with extending leg out   PWR! Twist for improved trunk rotation 2 x10 reps bilat- initial cues to stop and reset in the middle with tall posture  PWR! Step for improved step initiation 2 x 10 reps bilat, step out and out and step in and in   Cues provided for intensity of movement, larger movement patterns, and purpose of each PWR move. Verbal/demo cues provided for technique. Provided handout for HEP.     [place]  Adult PT Treatment/Exercise - 03/28/21 1945       Ambulation/Gait   Gait Comments pt ambulating into session with small step length and improper sequencing of cane, at end of sesison cued to take larger steps with pt able to consistently perform out of session with proper sequencing of cane      Self-Care   Self-Care Other Self-Care Comments    Other Self-Care Comments  discussed medication management with pt after it was discussed at eval (pt has reached out to Dr. [name] and pt reports that his prescription is being sent to the [facility] and will be mailed to him), pt asking about the purpose of this medication in regards to PD - PT providing explanation, purpose of larger amplitude movements with PD                    PT Education - 03/28/21 1941     Education Details see self care, initial seated PWR moves for HEP    Person(s) Educated Patient    Methods Explanation;Demonstration;Handout;Verbal  cues    Comprehension Verbalized understanding;Returned demonstration              PT Short Term Goals - 03/24/21 1435       PT SHORT TERM GOAL #1   Title Pt will be independent with HEP for improved strength, balance, transfers, and gait.  TARGET 04/14/2021    Time 4    Period Weeks    Status New      PT SHORT TERM GOAL #2   Title Pt will verbalize tips to reduce festination/freezing with gait and turns.    Time 4    Period Weeks    Status New      PT SHORT TERM GOAL #3   Title Pt will improve 5x sit<>stand to less than or equal to 15 sec to demonstrate improved functional strength and transfer efficiency.    Baseline 17.93 sec    Time 4    Period Weeks    Status New      PT SHORT TERM GOAL #4   Title Pt will improve TUG score to less than or equal to 15 sec for decreased fall risk    Baseline 19.91 sec    Time 4    Period Weeks    Status New      PT SHORT TERM GOAL #5   Title Pt will verbalize understanding of local Parkinson's disease resources.    Time 4    Period Weeks    Status New               PT Long Term Goals - 03/24/21 1437       PT LONG TERM GOAL #1   Title Pt will be independent with progression of HEP for improved strength, balance, decreased pain, transfers, and gait.  TARGET 05/12/21    Time 8    Period Weeks    Status New      PT LONG TERM GOAL #2   Title Pt will improve gait velocity to at least 2.2 ft/sec for improved gait efficiency and safety.    Baseline 1.76 ft/sec    Time 8    Period Weeks    Status New      PT LONG TERM GOAL #3   Title Pt will improve 5x sit<>stand to less than or equal to 12.6 sec to demonstrate improved functional strength and transfer efficiency.    Time 8    Period  Weeks    Status New      PT LONG TERM GOAL #4   Title Pt will improve TUG score to less than or equal to 13.5 sec for decreased fall risk.    Time 8    Period Weeks    Status New      PT LONG TERM GOAL #5   Title Pt will ambulate  at least 1000 ft indoor and outdoor surfaces, appropriate assistive device, mod independently, for imrpoved community gait.    Time 8    Period Weeks    Status New                   Plan - 03/28/21 1958     Clinical Impression Statement Today's skilled session focused on initiating pt's HEP for seated PWR moves with pt performing well and educated on purpose of each and how it relates to function. Provided handout for home. At end of session, pt responded well to cues to incr bilat step length while using cane. Will continue to progress towards LTGs.    Personal Factors and Comorbidities Comorbidity 3+    Comorbidities PMH:  neck and back pain, PD (no Sinemet), hx of ETOH abuse, CAD, COPD, emphysema, GERD, HTN, high cholesterol, PVD    Examination-Activity Limitations Locomotion Level;Transfers;Stand    Examination-Participation Restrictions Community Activity;Other   community fitness   Stability/Clinical Decision Making Evolving/Moderate complexity    Rehab Potential Good    PT Frequency 2x / week    PT Duration --   7 weeks, plus eval visit; total 8 weeks, including eval wee   PT Treatment/Interventions ADLs/Self Care Home Management;Gait training;Stair training;Functional mobility training;Therapeutic activities;Therapeutic exercise;Balance training;Neuromuscular re-education;DME Instruction;Manual techniques;Patient/family education;Passive range of motion    PT Next Visit Plan review seated PWR moves, gait training, also working on education regarding PD (check with Dr. [name] about medication timing), tips to reduce festination with gait and turns    Consulted and Agree with Plan of Care Patient             Patient will benefit from skilled therapeutic intervention in order to improve the following deficits and impairments:  Abnormal gait, Difficulty walking, Decreased balance, Decreased mobility, Decreased strength, Pain, Postural dysfunction (stiffness noted through  trunk)  Visit Diagnosis: Other abnormalities of gait and mobility  Unsteadiness on feet  Muscle weakness (generalized)  Abnormal posture     Problem List Patient Active Problem List   Diagnosis Date Noted   Parkinson's disease ([place]) 02/28/2021   Weakness of both legs 02/01/2021   Dysphagia 02/01/2021   Prostate cancer ([place]) 06/09/2020   Well adult exam 06/04/2019   BPH (benign prostatic hyperplasia) 06/04/2019   Bilateral carotid bruits 06/04/2019   Meniere disease, 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   Emphysema of lung ([place]) 06/04/2019   Spondylolisthesis at L4-L5 level 08/27/2017   Spinal stenosis in cervical region 08/27/2017   Tremor 06/02/2015   Cervical spondylosis without myelopathy 06/02/2015   Alcoholism in remission ([place]) 06/09/2014   HIP PAIN 11/27/2007   Essential hypertension 07/25/2007   Atrial fibrillation ([place]) 07/25/2007   GERD 07/25/2007   ERECTILE DYSFUNCTION 07/25/2007   LOW BACK PAIN 07/25/2007    Arliss Journey, PT, DPT  03/28/2021, 7:59 PM  [facility] [address] [place], [place], [address] Phone: [phone]   Fax:  [phone]  Name: Reeder Proa MRN: [id number] Date of Birth: [birth year]

## 2021-03-29 ENCOUNTER — Ambulatory Visit: Payer: No Typology Code available for payment source | Admitting: Physical Therapy

## 2021-03-29 DIAGNOSIS — R2689 Other abnormalities of gait and mobility: Secondary | ICD-10-CM

## 2021-03-29 DIAGNOSIS — M6281 Muscle weakness (generalized): Secondary | ICD-10-CM

## 2021-03-29 DIAGNOSIS — R2681 Unsteadiness on feet: Secondary | ICD-10-CM

## 2021-03-29 NOTE — Therapy (Signed)
[facility] [address] [place], [place], [address] Phone: [phone]   Fax:  [phone]  Physical Therapy Treatment  Patient Details  Name: Lee [name] MRN: [id number] Date of Birth: 1946-02-07 Referring Provider (PT): Landry Mellow, MD ([facility])   Encounter Date: 03/29/2021   PT End of Session - 03/29/21 1628     Visit Number 3    Number of Visits 15    Date for PT Re-Evaluation 05/12/21    Authorization Type [facility] approved 15 visits    Authorization - Visit Number 3    Authorization - Number of Visits 15    PT Start Time 1626   pt late and needing using to use restroom at start of session   Activity Tolerance Patient tolerated treatment well    Behavior During Therapy [facility] for tasks assessed/performed             Past Medical History:  Diagnosis Date   Acid reflux    Alcohol abuse    quit 10 y ago   Arthritis    BPH (benign prostatic hyperplasia)    CAD (coronary artery disease)    Cervical spondylosis without myelopathy 06/02/2015   COPD (chronic obstructive pulmonary disease) ([place])    Emphysema lung ([place])    GERD (gastroesophageal reflux disease)    High cholesterol    Hypertension    Parkinson's disease ([place])    PVD (peripheral vascular disease) ([place])    Tremor 06/02/2015    Past Surgical History:  Procedure Laterality Date   TONSILLECTOMY     VASECTOMY      There were no vitals filed for this visit.   Subjective Assessment - 03/29/21 1628     Subjective No changes sicne yesterday.    Pertinent History PMH:  neck and back pain, PD -dx in March 2022 per pt report.  (just started back on Sinemet), hx of ETOH abuse, CAD, COPD, emphysema, GERD, HTN, high cholesterol, PVD    How long can you walk comfortably? just walking around the house    Patient Stated Goals Pt's goals for therapy to get rid of the pain and move better.    Pain Score 2     Pain Location Back    Pain Orientation  Left;Lower    Pain Descriptors / Indicators Aching    Pain Type Chronic pain    Pain Onset More than a month ago    Aggravating Factors  certain movements    Pain Relieving Factors "coming out of the zone"                               [facility] Adult PT Treatment/Exercise - 03/29/21 2045       Ambulation/Gait   Ambulation/Gait Yes    Ambulation/Gait Assistance 5: Supervision;6: Modified independent (Device/Increase time)    Ambulation/Gait Assistance Details ambulated into clinic with [facility] and cues for step length. pt reporting that he does not ambulate with an AD at home, practiced with no AD with initial cues for stride length and arm swing with pt performing well with no issues, ambulating at an incr speed than with his cane. supervision needed    Ambulation Distance (Feet) 115 Feet   x1, 345 x 1   Assistive device Straight cane;None    Gait Pattern Step-through pattern;Decreased step length - right;Decreased step length - left;Shuffle;Festinating;Narrow base of support    Ambulation Surface Level;Indoor  Gait Comments no festination/freezing noted during session today. did educate on tips to help with festination episodes at home - that if pt does have an episode the first thing he needs to do is stop and reset with tall posture before continuing to ambulate, pt able to verbalize understanding              Pt performs PWR! Moves in sitting position:    PWR! Up for improved posture x10 reps, cues for scap retraction and opening B hands when performing    PWR! Rock for improved weighshifting - with reaching across body x 10 reps with extending leg out and cues to look at hands   PWR! Twist for improved trunk rotation x10 reps bilat- initial cues to stop and reset in the middle with tall posture   PWR! Step for improved step initiation x 10 reps bilat, step out and out and step in and in    Reviewed from HEP. Initial cues for technique. Pt performing well  today.      03/29/21 0001  Balance Exercises: Standing  Stepping Strategy Anterior;Lateral;UE support;Limitations  Stepping Strategy Limitations x10 reps lateral stepping over 2" > 4" obstacle with cues for incr step height and weight shift, x10 reps alternating forward stepping over 2" obstacle for foot clearance  Other Standing Exercises with BUE support: x10 reps lateral weight shifting through hips - verbal and demo cues for technique     PT Short Term Goals - 03/24/21 1435       PT SHORT TERM GOAL #1   Title Pt will be independent with HEP for improved strength, balance, transfers, and gait.  TARGET 04/14/2021    Time 4    Period Weeks    Status New      PT SHORT TERM GOAL #2   Title Pt will verbalize tips to reduce festination/freezing with gait and turns.    Time 4    Period Weeks    Status New      PT SHORT TERM GOAL #3   Title Pt will improve 5x sit<>stand to less than or equal to 15 sec to demonstrate improved functional strength and transfer efficiency.    Baseline 17.93 sec    Time 4    Period Weeks    Status New      PT SHORT TERM GOAL #4   Title Pt will improve TUG score to less than or equal to 15 sec for decreased fall risk    Baseline 19.91 sec    Time 4    Period Weeks    Status New      PT SHORT TERM GOAL #5   Title Pt will verbalize understanding of local Parkinson's disease resources.    Time 4    Period Weeks    Status New               PT Long Term Goals - 03/24/21 1437       PT LONG TERM GOAL #1   Title Pt will be independent with progression of HEP for improved strength, balance, decreased pain, transfers, and gait.  TARGET 05/12/21    Time 8    Period Weeks    Status New      PT LONG TERM GOAL #2   Title Pt will improve gait velocity to at least 2.2 ft/sec for improved gait efficiency and safety.    Baseline 1.76 ft/sec    Time 8    Period Weeks  Status New      PT LONG TERM GOAL #3   Title Pt will improve 5x sit<>stand  to less than or equal to 12.6 sec to demonstrate improved functional strength and transfer efficiency.    Time 8    Period Weeks    Status New      PT LONG TERM GOAL #4   Title Pt will improve TUG score to less than or equal to 13.5 sec for decreased fall risk.    Time 8    Period Weeks    Status New      PT LONG TERM GOAL #5   Title Pt will ambulate at least 1000 ft indoor and outdoor surfaces, appropriate assistive device, mod independently, for imrpoved community gait.    Time 8    Period Weeks    Status New                   Plan - 03/29/21 2049     Clinical Impression Statement Reviewed seated PWR moves from HEP given at yesterday's session with pt performing well today with bigger movement patterns. Ambulated without AD today as pt reports he does so at home and pt able to maintain gait speed with incr stride length and foot clearance, only needing supervision from therapist. Still has not received/picked up his new dosage of sinemet. Will continue to progress towards LTGs.    Personal Factors and Comorbidities Comorbidity 3+    Comorbidities PMH:  neck and back pain, PD (no Sinemet), hx of ETOH abuse, CAD, COPD, emphysema, GERD, HTN, high cholesterol, PVD    Examination-Activity Limitations Locomotion Level;Transfers;Stand    Examination-Participation Restrictions Community Activity;Other   community fitness   Stability/Clinical Decision Making Evolving/Moderate complexity    Rehab Potential Good    PT Frequency 2x / week    PT Duration --   7 weeks, plus eval visit; total 8 weeks, including eval wee   PT Treatment/Interventions ADLs/Self Care Home Management;Gait training;Stair training;Functional mobility training;Therapeutic activities;Therapeutic exercise;Balance training;Neuromuscular re-education;DME Instruction;Manual techniques;Patient/family education;Passive range of motion    PT Next Visit Plan try standing PWR moves? balance strategies, RLE foot clearance.  gait training, also working on education regarding PD, review tips to reduce festination with gait and turns    Consulted and Agree with Plan of Care Patient             Patient will benefit from skilled therapeutic intervention in order to improve the following deficits and impairments:  Abnormal gait, Difficulty walking, Decreased balance, Decreased mobility, Decreased strength, Pain, Postural dysfunction (stiffness noted through trunk)  Visit Diagnosis: No diagnosis found.     Problem List Patient Active Problem List   Diagnosis Date Noted   Parkinson's disease ([place]) 02/28/2021   Weakness of both legs 02/01/2021   Dysphagia 02/01/2021   Prostate cancer ([place]) 06/09/2020   Well adult exam 06/04/2019   BPH (benign prostatic hyperplasia) 06/04/2019   Bilateral carotid bruits 06/04/2019   Meniere disease, 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   Emphysema of lung ([place]) 06/04/2019   Spondylolisthesis at L4-L5 level 08/27/2017   Spinal stenosis in cervical region 08/27/2017   Tremor 06/02/2015   Cervical spondylosis without myelopathy 06/02/2015   Alcoholism in remission ([place]) 06/09/2014   HIP PAIN 11/27/2007   Essential hypertension 07/25/2007   Atrial fibrillation ([place]) 07/25/2007   GERD 07/25/2007   ERECTILE DYSFUNCTION 07/25/2007   LOW BACK PAIN 07/25/2007    Arliss Journey, PT, DPT  03/29/2021, 8:51 PM  [facility]  [facility] [address] [place], [place], [address] Phone: [phone]   Fax:  [phone]  Name: Nikki Jenson MRN: [id number] Date of Birth: 03/23/1946

## 2021-04-10 ENCOUNTER — Encounter: Payer: Self-pay | Admitting: Physical Therapy

## 2021-04-10 ENCOUNTER — Other Ambulatory Visit: Payer: Self-pay

## 2021-04-10 ENCOUNTER — Ambulatory Visit: Payer: No Typology Code available for payment source | Attending: Family Medicine | Admitting: Physical Therapy

## 2021-04-10 VITALS — BP 126/66 | HR 88

## 2021-04-10 DIAGNOSIS — R2689 Other abnormalities of gait and mobility: Secondary | ICD-10-CM

## 2021-04-10 DIAGNOSIS — M6281 Muscle weakness (generalized): Secondary | ICD-10-CM

## 2021-04-10 DIAGNOSIS — R2681 Unsteadiness on feet: Secondary | ICD-10-CM

## 2021-04-10 DIAGNOSIS — R293 Abnormal posture: Secondary | ICD-10-CM | POA: Diagnosis present

## 2021-04-10 NOTE — Patient Instructions (Signed)
Access Code: [email or web address] URL: [email or web address] Date: 04/10/2021 Prepared by: Janann August  Exercises Lateral Weight Shift - 1 x daily - 5 x weekly - 5 sets - 10 reps

## 2021-04-10 NOTE — Therapy (Signed)
[facility] [address] [place], [place], [address] Phone: [phone]   Fax:  [phone]  Physical Therapy Treatment  Patient Details  Name: Lee [name] MRN: [id number] Date of Birth: 1946-04-02 Referring Provider (PT): Landry Mellow, MD ([facility])   Encounter Date: 04/10/2021   PT End of Session - 04/10/21 2149     Visit Number 4    Number of Visits 15    Date for PT Re-Evaluation 05/12/21    Authorization Type VA approved 15 visits    Authorization - Visit Number 4    Authorization - Number of Visits 15    PT Start Time 1450    PT Stop Time 1531    PT Time Calculation (min) 41 min    Activity Tolerance Patient tolerated treatment well    Behavior During Therapy [facility] for tasks assessed/performed             Past Medical History:  Diagnosis Date   Acid reflux    Alcohol abuse    quit 10 y ago   Arthritis    BPH (benign prostatic hyperplasia)    CAD (coronary artery disease)    Cervical spondylosis without myelopathy 06/02/2015   COPD (chronic obstructive pulmonary disease) ([place])    Emphysema lung (HCC)    GERD (gastroesophageal reflux disease)    High cholesterol    Hypertension    Parkinson's disease ([place])    PVD (peripheral vascular disease) ([place])    Tremor 06/02/2015    Past Surgical History:  Procedure Laterality Date   TONSILLECTOMY     VASECTOMY      Vitals:   04/10/21 1504 04/10/21 1507  BP: 118/62 126/66  Pulse: 84 88     Subjective Assessment - 04/10/21 1453     Subjective Reports that ever since he started taking the increased dose of his medications, he has been feeling worse. And has been feeling more tired. No falls, just stumbling. Reports having dizziness.    Pertinent History PMH:  neck and back pain, PD -dx in March 2022 per pt report.  (just started back on Sinemet), hx of ETOH abuse, CAD, COPD, emphysema, GERD, HTN, high cholesterol, PVD    How long can you walk  comfortably? just walking around the house    Patient Stated Goals Pt's goals for therapy to get rid of the pain and move better.    Currently in Pain? No/denies    Pain Onset More than a month ago                               [facility] Adult PT Treatment/Exercise - 04/10/21 2151       Ambulation/Gait   Ambulation/Gait Yes    Ambulation/Gait Assistance 5: Supervision    Ambulation/Gait Assistance Details pt ambulating into clinic with shuffled steps and improper placement. with cane in RUE - cues for proper sequencing, larger steps and taller posture and to slow down his pace as pt tends to hurry up at times and lose sequencing. attempted with cane in LUE, but pt placing it too close to LLE and also tripping over it    Ambulation Distance (Feet) 345 Feet   x1   Assistive device Straight cane    Gait Pattern Step-through pattern;Decreased step length - right;Decreased step length - left;Shuffle;Festinating;Narrow base of support    Ambulation Surface Level;Indoor      Self-Care   Self-Care Other Self-Care  Comments    Other Self-Care Comments  further discussed medication management with pt and when to take his sinemet (not taking it with protein) and importance of taking it at the same time daily. pt reports that since incr the dosage he has not been feeling well from the side effects, encouraged pt throughout session to discuss these with his neurologist. pt has also been feeling more fatigued lately - to see his PCP regarding this later this week. pt reporting that he has felt dizzy, sometimes in standing, assessed pt's BP in sitting/standing and orthostatics not noted. did recommended making sure that pt takes his time with transitions. discussed exercise as medicine in PD             Access Code: [email or web address] URL: [email or web address] Date: 04/10/2021 Prepared by: Janann August   New addition to HEP on 04/10/21 Exercises Lateral Weight Shift - 1 x  daily - 5 x weekly - 5 sets - 10 reps    Balance Exercises - 04/10/21 0001       Balance Exercises: Standing   Other Standing Exercises at countertop: lateral weight shifting 2 x 10 reps throughout hips with cues for push off, x10 reps bilat staggered stance A/P weight shifting with cues for technique                PT Education - 04/10/21 2149     Education Details standing lateral weight shifting addition to HEP, see self care section above, provided local community resources handout    Person(s) Educated Patient    Methods Explanation;Handout;Demonstration    Comprehension Verbalized understanding;Returned demonstration              PT Short Term Goals - 04/10/21 2150       PT SHORT TERM GOAL #1   Title Pt will be independent with HEP for improved strength, balance, transfers, and gait.  TARGET 04/14/2021    Time 4    Period Weeks    Status New      PT SHORT TERM GOAL #2   Title Pt will verbalize tips to reduce festination/freezing with gait and turns.    Time 4    Period Weeks    Status New      PT SHORT TERM GOAL #3   Title Pt will improve 5x sit<>stand to less than or equal to 15 sec to demonstrate improved functional strength and transfer efficiency.    Baseline 17.93 sec    Time 4    Period Weeks    Status New      PT SHORT TERM GOAL #4   Title Pt will improve TUG score to less than or equal to 15 sec for decreased fall risk    Baseline 19.91 sec    Time 4    Period Weeks    Status New      PT SHORT TERM GOAL #5   Title Pt will verbalize understanding of local Parkinson's disease resources.    Baseline provided on 04/10/21, pt reports he will look at them later after having follow up visits with his physicians    Time 4    Period Weeks    Status Achieved               PT Long Term Goals - 03/24/21 1437       PT LONG TERM GOAL #1   Title Pt will be independent with progression of HEP for improved strength, balance, decreased pain,  transfers, and gait.  TARGET 05/12/21    Time 8    Period Weeks    Status New      PT LONG TERM GOAL #2   Title Pt will improve gait velocity to at least 2.2 ft/sec for improved gait efficiency and safety.    Baseline 1.76 ft/sec    Time 8    Period Weeks    Status New      PT LONG TERM GOAL #3   Title Pt will improve 5x sit<>stand to less than or equal to 12.6 sec to demonstrate improved functional strength and transfer efficiency.    Time 8    Period Weeks    Status New      PT LONG TERM GOAL #4   Title Pt will improve TUG score to less than or equal to 13.5 sec for decreased fall risk.    Time 8    Period Weeks    Status New      PT LONG TERM GOAL #5   Title Pt will ambulate at least 1000 ft indoor and outdoor surfaces, appropriate assistive device, mod independently, for imrpoved community gait.    Time 8    Period Weeks    Status New                   Plan - 04/10/21 2157     Clinical Impression Statement Provided pt handout on local PD resources for STG #5. Pt reporting that with incr dose of sinemet he has not been feeling or moving well, discussed that pt needs to talk with his neurologist. Worked on gait training with cane today in RUE with focus on slowed pace and incr stride length and R foot clearance, with pt able to improve with cues and incr distance. Will continue to progress towards LTGs.    Personal Factors and Comorbidities Comorbidity 3+    Comorbidities PMH:  neck and back pain, PD (no Sinemet), hx of ETOH abuse, CAD, COPD, emphysema, GERD, HTN, high cholesterol, PVD    Examination-Activity Limitations Locomotion Level;Transfers;Stand    Examination-Participation Restrictions Community Activity;Other   community fitness   Stability/Clinical Decision Making Evolving/Moderate complexity    Rehab Potential Good    PT Frequency 2x / week    PT Duration --   7 weeks, plus eval visit; total 8 weeks, including eval wee   PT Treatment/Interventions  ADLs/Self Care Home Management;Gait training;Stair training;Functional mobility training;Therapeutic activities;Therapeutic exercise;Balance training;Neuromuscular re-education;DME Instruction;Manual techniques;Patient/family education;Passive range of motion    PT Next Visit Plan check STGs. review seated PWR moves and lateral weight shifting at countertop. balance strategies. RLE foot clearance. gait training, continue with PD education, review tips to reduce festination with gait and turns    Consulted and Agree with Plan of Care Patient             Patient will benefit from skilled therapeutic intervention in order to improve the following deficits and impairments:  Abnormal gait, Difficulty walking, Decreased balance, Decreased mobility, Decreased strength, Pain, Postural dysfunction (stiffness noted through trunk)  Visit Diagnosis: Other abnormalities of gait and mobility  Muscle weakness (generalized)  Unsteadiness on feet  Abnormal posture     Problem List Patient Active Problem List   Diagnosis Date Noted   Parkinson's disease ([place]) 02/28/2021   Weakness of both legs 02/01/2021   Dysphagia 02/01/2021   Prostate cancer ([place]) 06/09/2020   Well adult exam 06/04/2019   BPH (benign prostatic hyperplasia) 06/04/2019   Bilateral carotid bruits 06/04/2019  Meniere disease, 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   Emphysema of lung ([place]) 06/04/2019   Spondylolisthesis at L4-L5 level 08/27/2017   Spinal stenosis in cervical region 08/27/2017   Tremor 06/02/2015   Cervical spondylosis without myelopathy 06/02/2015   Alcoholism in remission ([place]) 06/09/2014   HIP PAIN 11/27/2007   Essential hypertension 07/25/2007   Atrial fibrillation ([place]) 07/25/2007   GERD 07/25/2007   ERECTILE DYSFUNCTION 07/25/2007   LOW BACK PAIN 07/25/2007    Arliss Journey, PT, DPT  04/10/2021, 10:00 PM  [facility] [address] [place],  [place], [address] Phone: [phone]   Fax:  [phone]  Name: Lee [name] MRN: [id number] Date of Birth: May 29, 1946

## 2021-04-11 NOTE — Addendum Note (Signed)
Addended by: Kathrynn Ducking on: 04/11/2021 01:08 PM   Modules accepted: Orders

## 2021-04-11 NOTE — Telephone Encounter (Signed)
Pt called, carbidopa-levodopa (SINEMET IR) 25-100 MG tablet and gabapentin (NEURONTIN) 100 MG capsule may be causing some dizziness, fatigue. These medications are not helping. Can you prescribe something else? Would like a call from the nurse.

## 2021-04-11 NOTE — Telephone Encounter (Signed)
I called the patient.  The patient is having some fatigue, dizziness, and some sensation of depression.  He is on the Sinemet taking 1.5 tablets 3 times daily of the 25/100 mg Sinemet.  He takes gabapentin which was increased recently to 200 mg 3 times daily.  I have recommended that he taper off of the gabapentin as this could be resulting in all of his symptoms, certainly if the Sinemet may also be causing some fatigue and dizziness but this is necessary for his Parkinson's disease.  He reports that his main issue is he cannot get his right leg to do what he wants to do, this sounds more consistent with Parkinson's problems.  I have recommended physical therapy, he wants to hold off on this for now.

## 2021-04-11 NOTE — Telephone Encounter (Signed)
Any other suggestions?

## 2021-04-13 ENCOUNTER — Ambulatory Visit (INDEPENDENT_AMBULATORY_CARE_PROVIDER_SITE_OTHER): Payer: No Typology Code available for payment source | Admitting: Internal Medicine

## 2021-04-13 ENCOUNTER — Ambulatory Visit: Payer: No Typology Code available for payment source | Admitting: Physical Therapy

## 2021-04-13 ENCOUNTER — Telehealth: Payer: Self-pay | Admitting: Internal Medicine

## 2021-04-13 ENCOUNTER — Encounter: Payer: Self-pay | Admitting: Internal Medicine

## 2021-04-13 ENCOUNTER — Other Ambulatory Visit: Payer: Self-pay

## 2021-04-13 DIAGNOSIS — R293 Abnormal posture: Secondary | ICD-10-CM

## 2021-04-13 DIAGNOSIS — F329 Major depressive disorder, single episode, unspecified: Secondary | ICD-10-CM | POA: Diagnosis not present

## 2021-04-13 DIAGNOSIS — R2681 Unsteadiness on feet: Secondary | ICD-10-CM

## 2021-04-13 DIAGNOSIS — H8103 Meniere's disease, bilateral: Secondary | ICD-10-CM

## 2021-04-13 DIAGNOSIS — M6281 Muscle weakness (generalized): Secondary | ICD-10-CM

## 2021-04-13 DIAGNOSIS — F32A Depression, unspecified: Secondary | ICD-10-CM | POA: Insufficient documentation

## 2021-04-13 DIAGNOSIS — R29898 Other symptoms and signs involving the musculoskeletal system: Secondary | ICD-10-CM

## 2021-04-13 DIAGNOSIS — R2689 Other abnormalities of gait and mobility: Secondary | ICD-10-CM

## 2021-04-13 DIAGNOSIS — G20 Parkinson's disease: Secondary | ICD-10-CM

## 2021-04-13 NOTE — Therapy (Signed)
[facility] [address] [place], [place], [address] Phone: [phone]   Fax:  [phone]  Physical Therapy Treatment  Patient Details  Name: Lee [name] MRN: [id number] Date of Birth: 1946/03/29 Referring Provider (PT): Landry Mellow, MD ([facility])   Encounter Date: 04/13/2021   PT End of Session - 04/13/21 1530     Visit Number 5    Number of Visits 15    Date for PT Re-Evaluation 05/12/21    Authorization Type VA approved 15 visits    Authorization - Visit Number 5    Authorization - Number of Visits 15    PT Start Time 1319    PT Stop Time 1400    PT Time Calculation (min) 41 min    Activity Tolerance Patient tolerated treatment well    Behavior During Therapy [facility] for tasks assessed/performed             Past Medical History:  Diagnosis Date   Acid reflux    Alcohol abuse    quit 10 y ago   Arthritis    BPH (benign prostatic hyperplasia)    CAD (coronary artery disease)    Cervical spondylosis without myelopathy 06/02/2015   COPD (chronic obstructive pulmonary disease) ([place])    Emphysema lung ([place])    GERD (gastroesophageal reflux disease)    High cholesterol    Hypertension    Parkinson's disease ([place])    PVD (peripheral vascular disease) ([place])    Tremor 06/02/2015    Past Surgical History:  Procedure Laterality Date   TONSILLECTOMY     VASECTOMY      There were no vitals filed for this visit.   Subjective Assessment - 04/13/21 1325     Subjective Just feel like my legs are not functioning the right way.  I just don't feel like I am making any progress.    Pertinent History PMH:  neck and back pain, PD -dx in March 2022 per pt report.  (just started back on Sinemet), hx of ETOH abuse, CAD, COPD, emphysema, GERD, HTN, high cholesterol, PVD    How long can you walk comfortably? just walking around the house    Patient Stated Goals Pt's goals for therapy to get rid of the pain and  move better.    Currently in Pain? No/denies    Pain Onset More than a month ago                               [facility] Adult PT Treatment/Exercise - 04/13/21 0001       Transfers   Transfers Sit to Stand;Stand to Sit    Sit to Stand 6: Modified independent (Device/Increase time);Without upper extremity assist;From chair/3-in-1    Five time sit to stand comments  24.31; 2nd trial 18.12 sec    Stand to Sit 6: Modified independent (Device/Increase time);Without upper extremity assist;To chair/3-in-1      Ambulation/Gait   Ambulation/Gait Yes    Ambulation/Gait Assistance 5: Supervision    Ambulation/Gait Assistance Details Gait into and out of therapy, today with improved step length noted compared to last visit's notes, for straight line gait.  Pt begins to shuffle more/festinate more with gait approaching turns and with turns.    Ambulation Distance (Feet) 80 Feet   x 2; additional short distance gait, 10 ft x 4 reps   Assistive device Straight cane    Gait Pattern Step-through pattern;Decreased step  length - right;Decreased step length - left;Shuffle;Festinating;Narrow base of support;Poor foot clearance - left;Poor foot clearance - right    Ambulation Surface Level;Indoor    Gait Comments Discussed tips to reduce freezing with gait and provided handout/practiced in session today:  stop to reset posture, use wide BOS with lateral weightshifting to reset and start again with BIG steps and to try to slow pace/marching to turn, for improved foot clearance with turns.      Standardized Balance Assessment   Standardized Balance Assessment Timed Up and Go Test      Timed Up and Go Test   TUG Normal TUG    Normal TUG (seconds) 12.13   cane     Neuro Re-ed    Neuro Re-ed Details  Reviewed pt's HEP: seated PWR! Moves:  PWR! Up x 10 reps (cues for posture, position), PWR! Rock x 10 reps each side, PWR! Twist x 10 reps each side (cues to perform 2 sets of 5 at home); PWR!  Step, single step out and in, 2 sets x 5 reps.  Cues for increased step height and foot clearance.  Standing lateral weightshifting with UE support at chair x 5 reps, then with UE support at cane.  Marching in place 5 reps each leg, with BUE support at chair, cues for increased step height.                     PT Education - 04/13/21 1529     Education Details Rationale behind seated PWR! Moves, tips to reduce freezing/festination of gait; progress towards goals/POC    Person(s) Educated Patient    Methods Explanation    Comprehension Verbalized understanding              PT Short Term Goals - 04/13/21 1531       PT SHORT TERM GOAL #1   Title Pt will be independent with HEP for improved strength, balance, transfers, and gait.  TARGET 04/14/2021    Time 4    Period Weeks    Status Achieved      PT SHORT TERM GOAL #2   Title Pt will verbalize tips to reduce festination/freezing with gait and turns.    Baseline initated and practiced; needs reminder cues    Time 4    Period Weeks    Status Partially Met      PT SHORT TERM GOAL #3   Title Pt will improve 5x sit<>stand to less than or equal to 15 sec to demonstrate improved functional strength and transfer efficiency.    Baseline 17.93 sec; 18.12 sec 04/13/2021    Time 4    Period Weeks    Status Not Met      PT SHORT TERM GOAL #4   Title Pt will improve TUG score to less than or equal to 15 sec for decreased fall risk    Baseline 19.91 sec; 12/13 sec 04/13/21, but borderline hastening with gait    Time 4    Period Weeks    Status Achieved      PT SHORT TERM GOAL #5   Title Pt will verbalize understanding of local Parkinson's disease resources.    Baseline provided on 04/10/21, pt reports he will look at them later after having follow up visits with his physicians    Time 4    Period Weeks    Status Achieved               PT Long Term  Goals - 03/24/21 1437       PT LONG TERM GOAL #1   Title Pt will  be independent with progression of HEP for improved strength, balance, decreased pain, transfers, and gait.  TARGET 05/12/21    Time 8    Period Weeks    Status New      PT LONG TERM GOAL #2   Title Pt will improve gait velocity to at least 2.2 ft/sec for improved gait efficiency and safety.    Baseline 1.76 ft/sec    Time 8    Period Weeks    Status New      PT LONG TERM GOAL #3   Title Pt will improve 5x sit<>stand to less than or equal to 12.6 sec to demonstrate improved functional strength and transfer efficiency.    Time 8    Period Weeks    Status New      PT LONG TERM GOAL #4   Title Pt will improve TUG score to less than or equal to 13.5 sec for decreased fall risk.    Time 8    Period Weeks    Status New      PT LONG TERM GOAL #5   Title Pt will ambulate at least 1000 ft indoor and outdoor surfaces, appropriate assistive device, mod independently, for imrpoved community gait.    Time 8    Period Weeks    Status New                   Plan - 04/13/21 1531     Clinical Impression Statement Remaining STGs assessed this visit, with pt meeting STG 1 for HEP and STG 4 for TUG score.  Pt has met 3 of 5 STGs total; STG 2 partially met and STG 3 not met for improved 5x sit<>stand score.  Pt feels he is not making progress.  Extensive education/reinforcement provided today regarding importance of movement patterns, strategies to decrease freezing and festination episodes.  Pt has difficulty with foot clearance for turns today.  HE will continue to benefit from skilled PT to further address gait, balance, functional strength for overall improved funcitonal mobility and decreased fall risk.    Personal Factors and Comorbidities Comorbidity 3+    Comorbidities PMH:  neck and back pain, PD (no Sinemet), hx of ETOH abuse, CAD, COPD, emphysema, GERD, HTN, high cholesterol, PVD    Examination-Activity Limitations Locomotion Level;Transfers;Stand    Examination-Participation  Restrictions Community Activity;Other   community fitness   Stability/Clinical Decision Making Evolving/Moderate complexity    Rehab Potential Good    PT Frequency 2x / week    PT Duration --   7 weeks, plus eval visit; total 8 weeks, including eval wee   PT Treatment/Interventions ADLs/Self Care Home Management;Gait training;Stair training;Functional mobility training;Therapeutic activities;Therapeutic exercise;Balance training;Neuromuscular re-education;DME Instruction;Manual techniques;Patient/family education;Passive range of motion    PT Next Visit Plan Work on lateral and anterior/posterior weightshifting at [employer].  Step strategies, foot clearance for balance. RLE foot clearance. gait training, continue with PD education, review tips to reduce festination with gait and turns    Consulted and Agree with Plan of Care Patient             Patient will benefit from skilled therapeutic intervention in order to improve the following deficits and impairments:  Abnormal gait, Difficulty walking, Decreased balance, Decreased mobility, Decreased strength, Pain, Postural dysfunction (stiffness noted through trunk)  Visit Diagnosis: Other abnormalities of gait and mobility  Unsteadiness on  feet  Muscle weakness (generalized)  Abnormal posture     Problem List Patient Active Problem List   Diagnosis Date Noted   Depression 04/13/2021   Parkinson's disease ([place]) 02/28/2021   Weakness of both legs 02/01/2021   Dysphagia 02/01/2021   Prostate cancer ([place]) 06/09/2020   Well adult exam 06/04/2019   BPH (benign prostatic hyperplasia) 06/04/2019   Bilateral carotid bruits 06/04/2019   Meniere disease, 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   Emphysema of lung ([place]) 06/04/2019   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 [place], [place], [address] Phone: [phone]   Fax:  [phone]  Name: Shondale Quinley MRN: [id number] Date of Birth: 06/20/1946

## 2021-04-13 NOTE — Telephone Encounter (Signed)
Patient following up after appt 09.15.22  Patient says his right leg is swollen & giving him a lot of trouble  Patient not sure or cant remember if himi & provider discussed this but he wants to know what could possibly be causing the swelling  Please follow up w/ patient

## 2021-04-13 NOTE — Progress Notes (Addendum)
Subjective:  Patient ID: Lee [name], male    DOB: 10-11-1945  Age: 75 y.o. MRN: [id number]  CC: Follow-up (6 week f/u)   HPI Lee [name] presents for gait disorder, leg weakness R>L C/o depression, moodiness, PD Poor historian, vague answers. I'm not sure he is taking his meds correct."I'm taking 6 meds".  Outpatient Medications Prior to Visit  Medication Sig Dispense Refill   albuterol (PROVENTIL HFA;VENTOLIN HFA) 108 (90 Base) MCG/ACT inhaler Inhale 2 puffs into the lungs every 4 (four) hours as needed for wheezing or shortness of breath. 1 Inhaler 0   Capsaicin 0.1 % CREA APPLY THIN LAYER TO AFFECTED AREA TWICE A DAY AS NEEDED     carbidopa-levodopa (SINEMET IR) 25-100 MG tablet 1.5 tablets 3 times daily for 1 month, then take 2 tablets 3 times daily (Patient taking differently: 1 tablets 3 times daily for 1 month, then take 2 tablets 3 times daily) 540 tablet 1   CINNAMON PO TAKE 1 CAP/TAB BY MOUTH DAILY     diclofenac Sodium (VOLTAREN) 1 % GEL Apply topically.     Ergocalciferol (VITAMIN D2) 50 MCG (2000 UT) TABS Take by mouth.     FEROSUL 325 (65 Fe) MG tablet Take by mouth.     fluticasone (FLONASE) 50 MCG/ACT nasal spray INSTILL 1 SPRAY IN EACH NOSTRIL TWICE A DAY FOR DIZZINESS     folic acid (FOLVITE) 1 MG tablet Take 1 mg by mouth daily.     Ginger, Zingiber officinalis, (GINGER PO) TAKE 1 CAP/TAB BY MOUTH DAILY     mometasone-formoterol (DULERA) 100-5 MCG/ACT AERO INHALE 2 INHALATIONS BY MOUTH TWICE A DAY (RINSE MOUTH WELL WITH WATER AFTER EACH USE) FOR BREATHING, COPD, ASTHMA.  REPLACES [place].     Multiple Vitamin (QUINTABS) TABS Take by mouth.     mupirocin ointment (BACTROBAN) 2 % SMARTSIG:1 Application Topical 2-3 Times Daily     Omega-3 Fatty Acids (FISH OIL PO) Take by mouth.     oxybutynin (DITROPAN-XL) 10 MG 24 hr tablet Take 10 mg by mouth at bedtime.     pantoprazole (PROTONIX) 40 MG tablet Take by mouth.     rasagiline (AZILECT) 0.5 MG  TABS tablet Take by mouth. Take 1 by mouth daily     tamsulosin (FLOMAX) 0.4 MG CAPS capsule Take 0.4 mg by mouth.     Tiotropium Bromide Monohydrate 2.5 MCG/ACT AERS Inhale 1 puff into the lungs daily.     Turmeric (QC TUMERIC COMPLEX PO) Take by mouth.     Turmeric POWD Take 5 mLs by mouth daily.     vitamin C (ASCORBIC ACID) 500 MG tablet Take by mouth.     dextromethorphan-guaiFENesin (ROBITUSSIN-DM) 10-100 MG/5ML liquid TAKE 2 TEASPOONFULS BY MOUTH EVERY 6 HOURS AS NEEDED FOR COUGH AND CONGESTION     No facility-administered medications prior to visit.    ROS: Review of Systems  Constitutional:  Positive for fatigue. Negative for appetite change and unexpected weight change.  HENT:  Negative for congestion, nosebleeds, sneezing, sore throat and trouble swallowing.   Eyes:  Negative for itching and visual disturbance.  Respiratory:  Negative for cough.   Cardiovascular:  Negative for chest pain, palpitations and leg swelling.  Gastrointestinal:  Negative for abdominal distention, blood in stool, diarrhea and nausea.  Genitourinary:  Negative for frequency and hematuria.  Musculoskeletal:  Positive for gait problem. Negative for back pain, joint swelling and neck pain.  Skin:  Negative for rash.  Neurological:  Positive for  dizziness, tremors and weakness. Negative for speech difficulty.  Psychiatric/Behavioral:  Positive for dysphoric mood. Negative for agitation, sleep disturbance and suicidal ideas. The patient is nervous/anxious.    Objective:  BP 120/70 (BP Location: Left Arm)   Pulse 79   Temp 98 F (36.7 C) (Oral)   Ht 5' 10.5" (1.791 m)   Wt 154 lb 3.2 oz (69.9 kg)   SpO2 98%   BMI 21.81 kg/m   BP Readings from Last 3 Encounters:  04/13/21 120/70  04/10/21 126/66  03/01/21 118/62    Wt Readings from Last 3 Encounters:  04/13/21 154 lb 3.2 oz (69.9 kg)  03/01/21 152 lb 9.6 oz (69.2 kg)  02/28/21 152 lb (68.9 kg)    Physical Exam Constitutional:       General: He is not in acute distress.    Appearance: Normal appearance. He is well-developed.     Comments: NAD  Eyes:     Conjunctiva/sclera: Conjunctivae normal.     Pupils: Pupils are equal, round, and reactive to light.  Neck:     Thyroid: No thyromegaly.     Vascular: No JVD.  Cardiovascular:     Rate and Rhythm: Normal rate and regular rhythm.     Heart sounds: Normal heart sounds. No murmur heard.   No friction rub. No gallop.  Pulmonary:     Effort: Pulmonary effort is normal. No respiratory distress.     Breath sounds: Normal breath sounds. No wheezing or rales.  Chest:     Chest wall: No tenderness.  Abdominal:     General: Bowel sounds are normal. There is no distension.     Palpations: Abdomen is soft. There is no mass.     Tenderness: There is no abdominal tenderness. There is no guarding or rebound.  Musculoskeletal:        General: No tenderness. Normal range of motion.     Cervical back: Normal range of motion.  Lymphadenopathy:     Cervical: No cervical adenopathy.  Skin:    General: Skin is warm and dry.     Findings: No rash.  Neurological:     Mental Status: He is alert and oriented to person, place, and time.     Cranial Nerves: No cranial nerve deficit.     Motor: Weakness present. No abnormal muscle tone.     Coordination: Coordination abnormal.     Gait: Gait abnormal.     Deep Tendon Reflexes: Reflexes are normal and symmetric.  Psychiatric:        Behavior: Behavior normal.        Thought Content: Thought content normal.        Judgment: Judgment normal.  Using a cane - walking better  Lab Results  Component Value Date   WBC 3.4 (L) 02/01/2021   HGB 13.6 02/01/2021   HCT 39.8 02/01/2021   PLT 184.0 02/01/2021   GLUCOSE 95 02/01/2021   CHOL 225 (H) 02/01/2021   TRIG 56.0 02/01/2021   HDL 88.10 02/01/2021   LDLCALC 126 (H) 02/01/2021   ALT 14 02/01/2021   AST 18 02/01/2021   NA 140 02/01/2021   K 3.7 02/01/2021   CL 104 02/01/2021    CREATININE 1.02 02/01/2021   BUN 8 02/01/2021   CO2 29 02/01/2021   TSH 1.61 02/01/2021   PSA 5.09 (H) 02/01/2021    No results found.  Assessment & Plan:   Problem List Items Addressed This Visit     Depression  New Discussed options. SSRI suggested. Mr Lee [name] declined meds       Meniere disease, bilateral    Chronic vertigo      Parkinson's disease ([place])    In PT now. S/p Neuro eval by Dr [name] on 03/02/21 Poor historian, vague answers. I'm not sure he is taking his meds correct."I'm taking 6 meds".      Weakness of both legs      03/02/21 NCS/EMG IMPRESSION:  This study demonstrates: -Normal nerve conduction studies and EMG of right lower extremity, except isolated occasional positive sharp waves in right gastrocnemius.  -Normal nerve conduction studies of left lower extremity, except decreased amplitude of left peroneal motor response.  Patient denies any numbness, pain or weakness of left lower extremity and this finding is of unclear clinical significance.  In PT now. S/p Neuro eval by Dr [name] on 03/02/21 Poor historian, vague answers. I'm not sure he is taking his meds correct."I'm taking 6 meds".          Walker Kehr, MD

## 2021-04-13 NOTE — Patient Instructions (Signed)
Tips to reduce freezing episodes with standing or walking:  Stand tall with your feet wide, so that you can rock and weight shift through your hips. Don't try to fight the freeze: if you begin taking slower, faster, smaller steps, STOP, get your posture tall, and RESET your posture and balance.  Take a deep breath before taking the BIG step to start again. March in place, with high knee stepping, to get started walking again. Use auditory cues:  Count out loud, think of a familiar tune or song or cadence, use pocket metronome, to use rhythm to get started walking again. Use visual cues:  Use a line to step over, use laser pointer line to step over, (using BIG steps) to start walking again. Use visual targets to keep your posture tall (look ahead and focus on an object or target at eye level). As you approach where your destination with walking, count your steps out loud and/or focus on your target with your eyes until you are fully there. Use appropriate assistive device, as advised by your physical therapist to assist with taking longer, consistent steps.  

## 2021-04-13 NOTE — Assessment & Plan Note (Signed)
Chronic vertigo

## 2021-04-13 NOTE — Assessment & Plan Note (Signed)
  03/02/21 NCS/EMG IMPRESSION:  This study demonstrates: -Normal nerve conduction studies and EMG of right lower extremity, except isolated occasional positive sharp waves in right gastrocnemius.  -Normal nerve conduction studies of left lower extremity, except decreased amplitude of left peroneal motor response.  Patient denies any numbness, pain or weakness of left lower extremity and this finding is of unclear clinical significance.  In PT now. S/p Neuro eval by Dr [name] on 03/02/21 Poor historian, vague answers. I'm not sure he is taking his meds correct."I'm taking 6 meds".

## 2021-04-13 NOTE — Assessment & Plan Note (Addendum)
New Discussed options. SSRI suggested. Mr Lee [name] declined meds

## 2021-04-13 NOTE — Assessment & Plan Note (Signed)
In PT now. S/p Neuro eval by Dr [name] on 03/02/21 Poor historian, vague answers. I'm not sure he is taking his meds correct."I'm taking 6 meds".

## 2021-04-14 NOTE — Telephone Encounter (Signed)
I do not recall we had any discussion.  What part of leg is it?  Thanks

## 2021-04-14 NOTE — Telephone Encounter (Signed)
Notified pt w/ MD response. Pt states it his (R) knee and when he keep moving swelling goes ito his ankle.Marland KitchenJohny [name]

## 2021-04-15 NOTE — Telephone Encounter (Signed)
I would try over-the-counter ibuprofen 400 mg 2-3 times a day with food as needed.  Please come back to see me if not better.  Thanks

## 2021-04-17 ENCOUNTER — Other Ambulatory Visit: Payer: Self-pay

## 2021-04-17 ENCOUNTER — Ambulatory Visit: Payer: No Typology Code available for payment source | Admitting: Physical Therapy

## 2021-04-17 ENCOUNTER — Encounter: Payer: Self-pay | Admitting: Physical Therapy

## 2021-04-17 DIAGNOSIS — R2689 Other abnormalities of gait and mobility: Secondary | ICD-10-CM

## 2021-04-17 DIAGNOSIS — R2681 Unsteadiness on feet: Secondary | ICD-10-CM

## 2021-04-17 DIAGNOSIS — M6281 Muscle weakness (generalized): Secondary | ICD-10-CM

## 2021-04-17 NOTE — Telephone Encounter (Signed)
I called the patient.  The patient's had trouble with constipation, I indicated that gabapentin some time to do this, but he also is on oxybutynin which can cause constipation.  He went off of Sinemet and gabapentin, and his bowels start working better.  I indicated that he needs to get back on the Sinemet to help mobility, if he has constipation he can take MiraLAX.  He is to take the Sinemet 3 times daily.

## 2021-04-17 NOTE — Telephone Encounter (Signed)
Called pt no answer LMOM w/MD response.Marland KitchenLind Guest

## 2021-04-17 NOTE — Telephone Encounter (Signed)
Pt called to report constipation, decreased mobility, and depression symptoms have been noted over the last few days. Pt wanted to know if this could be related to his gabapentin and carb/levo usage?

## 2021-04-17 NOTE — Telephone Encounter (Signed)
Pt called, having constipation, mobility, depression. Believe a side effect of Gabapentin and Carbidopa-Levodopa. Would like a call from the nurse.

## 2021-04-17 NOTE — Therapy (Signed)
[facility] [address] [place], [place], [address] Phone: [phone]   Fax:  [phone]  Physical Therapy Treatment  Patient Details  Name: Lee [name] MRN: [id number] Date of Birth: [birth year] Referring Provider (PT): Landry Mellow, MD ([facility])   Encounter Date: 04/17/2021   PT End of Session - 04/17/21 1424     Visit Number 6    Number of Visits 15    Date for PT Re-Evaluation 05/12/21    Authorization Type [facility] approved 15 visits    Authorization - Visit Number 6    Authorization - Number of Visits 15    PT Start Time 1319    PT Stop Time 3546    PT Time Calculation (min) 44 min    Activity Tolerance Patient tolerated treatment well    Behavior During Therapy [facility] for tasks assessed/performed             Past Medical History:  Diagnosis Date   Acid reflux    Alcohol abuse    quit 10 y ago   Arthritis    BPH (benign prostatic hyperplasia)    CAD (coronary artery disease)    Cervical spondylosis without myelopathy 06/02/2015   COPD (chronic obstructive pulmonary disease) ([place])    Emphysema lung ([place])    GERD (gastroesophageal reflux disease)    High cholesterol    Hypertension    Parkinson's disease ([place])    PVD (peripheral vascular disease) ([place])    Tremor 06/02/2015    Past Surgical History:  Procedure Laterality Date   TONSILLECTOMY     VASECTOMY      There were no vitals filed for this visit.   Subjective Assessment - 04/17/21 1325     Subjective Stopped those medications, (Sinemet and ?Gabapentin) over the weekend.  Just didn't like the side effects.  Went to [place] today.  No falls    Pertinent History PMH:  neck and back pain, PD -dx in March 2022 per pt report.  (just started back on Sinemet), hx of ETOH abuse, CAD, COPD, emphysema, GERD, HTN, high cholesterol, PVD    How long can you walk comfortably? just walking around the house    Patient Stated Goals Pt's goals for therapy  to get rid of the pain and move better.    Currently in Pain? No/denies    Pain Onset More than a month ago                               [facility] Adult PT Treatment/Exercise - 04/17/21 0001       Transfers   Transfers Sit to Stand;Stand to Sit    Sit to Stand 6: Modified independent (Device/Increase time);Without upper extremity assist;From chair/3-in-1    Stand to Sit 6: Modified independent (Device/Increase time);Without upper extremity assist;To chair/3-in-1    Number of Reps Other reps (comment)   at least 5 reps through session   Transfer Cueing cues to steady and find his balance prior to      Ambulation/Gait   Ambulation/Gait Yes    Ambulation/Gait Assistance 5: Supervision    Ambulation/Gait Assistance Details Gait into and out of clinic session, also between activities, using cane    Ambulation Distance (Feet) 80 Feet   x 2, 100 ft x 2   Assistive device Straight cane    Gait Pattern Step-through pattern;Decreased step length - right;Decreased step length - left;Shuffle;Festinating;Narrow base of support;Poor foot  clearance - left;Poor foot clearance - right    Ambulation Surface Level;Indoor    Gait Comments Reviewed tips to reduce freezing/festination episodes with gait.  Pt able to verbalize rocking  and trying to march.  He reports he has more difficulty at home.      High Level Balance   High Level Balance Activities Other (comment)   Turning practice   High Level Balance Comments Standing at counter, practiced step and weight shift at 90 degree ankles (clock turn from 12:00 to 3:00 and return to 12:00, then from 12:00-9:00 then back to 12:00)  Practiced single step out and in x 5 reps, then bilateral step out and in x 5 reps to each side, then return to center.  Then practiced initiating turn to walk short distance to mat directly behind him, x 2, with cues for 90 turn to start.  Pt uses UE support at counter, then cane support      Self-Care    Self-Care Other Self-Care Comments    Other Self-Care Comments  Pt reports stopping his 2 PD medications that Dr. [name] prescribed for him.  PT advised pt to contact Dr. [name] with questions and concerns about medications.  PT discussed that generally pt should not stop taking medications all at once without getting MD advice.  Pt conitnues to state that he doesn't feel he is any better.  PT discussed that Parkinson's disease will not be fixed or go away with therapy, but that strategies that we teach him are meant to help him with increased awareness of more optimal movement patterns.      Exercises   Exercises Knee/Hip      Knee/Hip Exercises: Aerobic   Stepper Seated stepper, 4 extremities x 4 minutes, BLEs only x 2 minutes, cues to keep steps/min >70 for increased intensity, large amplitude movement patterns.                 Balance Exercises - 04/17/21 0001       Balance Exercises: Standing   Stepping Strategy Lateral;UE support;10 reps    Stepping Strategy Limitations Cues for increased foot clearance, step height.    Other Standing Exercises Stagger stance rocking forward/back at counter, 10 reps anterior/posterior weightshifting, then with wide BOS at counter, anterior/posterior weightshifting through hips x 10 reps.                  PT Short Term Goals - 04/13/21 1531       PT SHORT TERM GOAL #1   Title Pt will be independent with HEP for improved strength, balance, transfers, and gait.  TARGET 04/14/2021    Time 4    Period Weeks    Status Achieved      PT SHORT TERM GOAL #2   Title Pt will verbalize tips to reduce festination/freezing with gait and turns.    Baseline initated and practiced; needs reminder cues    Time 4    Period Weeks    Status Partially Met      PT SHORT TERM GOAL #3   Title Pt will improve 5x sit<>stand to less than or equal to 15 sec to demonstrate improved functional strength and transfer efficiency.    Baseline 17.93 sec; 18.12  sec 04/13/2021    Time 4    Period Weeks    Status Not Met      PT SHORT TERM GOAL #4   Title Pt will improve TUG score to less than or equal to  15 sec for decreased fall risk    Baseline 19.91 sec; 12/13 sec 04/13/21, but borderline hastening with gait    Time 4    Period Weeks    Status Achieved      PT SHORT TERM GOAL #5   Title Pt will verbalize understanding of local Parkinson's disease resources.    Baseline provided on 04/10/21, pt reports he will look at them later after having follow up visits with his physicians    Time 4    Period Weeks    Status Achieved               PT Long Term Goals - 03/24/21 1437       PT LONG TERM GOAL #1   Title Pt will be independent with progression of HEP for improved strength, balance, decreased pain, transfers, and gait.  TARGET 05/12/21    Time 8    Period Weeks    Status New      PT LONG TERM GOAL #2   Title Pt will improve gait velocity to at least 2.2 ft/sec for improved gait efficiency and safety.    Baseline 1.76 ft/sec    Time 8    Period Weeks    Status New      PT LONG TERM GOAL #3   Title Pt will improve 5x sit<>stand to less than or equal to 12.6 sec to demonstrate improved functional strength and transfer efficiency.    Time 8    Period Weeks    Status New      PT LONG TERM GOAL #4   Title Pt will improve TUG score to less than or equal to 13.5 sec for decreased fall risk.    Time 8    Period Weeks    Status New      PT LONG TERM GOAL #5   Title Pt will ambulate at least 1000 ft indoor and outdoor surfaces, appropriate assistive device, mod independently, for imrpoved community gait.    Time 8    Period Weeks    Status New                   Plan - 04/17/21 1425     Clinical Impression Statement Worked today on aerobic activity at beginning of session for increased flexibility as well as instruction on intensity of movement patterns.  Then focused on weightshifting and stepping/weightshifting  exercises, progressing to work on initiating turns.  Pt responds well to the quarter/clock turn method to initiate turn/change of direction from stopped position.  Of note, pt also reports that he has stopped both medications that Dr. [name] put him on for Parkinson's.  PT advised pt to let Dr. [name] know about this as soon as possible.    Personal Factors and Comorbidities Comorbidity 3+    Comorbidities PMH:  neck and back pain, PD (no Sinemet), hx of ETOH abuse, CAD, COPD, emphysema, GERD, HTN, high cholesterol, PVD    Examination-Activity Limitations Locomotion Level;Transfers;Stand    Examination-Participation Restrictions Community Activity;Other   community fitness   Stability/Clinical Decision Making Evolving/Moderate complexity    Rehab Potential Good    PT Frequency 2x / week    PT Duration --   7 weeks, plus eval visit; total 8 weeks, including eval wee   PT Treatment/Interventions ADLs/Self Care Home Management;Gait training;Stair training;Functional mobility training;Therapeutic activities;Therapeutic exercise;Balance training;Neuromuscular re-education;DME Instruction;Manual techniques;Patient/family education;Passive range of motion    PT Next Visit Plan Work on lateral and anterior/posterior  as well as clock/quarter turns at counter/cane.  Add quarter turns to HEP.  Step strategies, foot clearance for balance. RLE foot clearance. gait training, continue with PD education, review tips to reduce festination with gait and turns    Consulted and Agree with Plan of Care Patient             Patient will benefit from skilled therapeutic intervention in order to improve the following deficits and impairments:  Abnormal gait, Difficulty walking, Decreased balance, Decreased mobility, Decreased strength, Pain, Postural dysfunction (stiffness noted through trunk)  Visit Diagnosis: Other abnormalities of gait and mobility  Unsteadiness on feet  Muscle weakness  (generalized)     Problem List Patient Active Problem List   Diagnosis Date Noted   Depression 04/13/2021   Parkinson's disease ([place]) 02/28/2021   Weakness of both legs 02/01/2021   Dysphagia 02/01/2021   Prostate cancer ([place]) 06/09/2020   Well adult exam 06/04/2019   BPH (benign prostatic hyperplasia) 06/04/2019   Bilateral carotid bruits 06/04/2019   Meniere disease, 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 [place], [place], [address] Phone: [phone]   Fax:  [phone]  Name: Aleph Nickson MRN: [id number] Date of Birth: [birth year]

## 2021-04-20 ENCOUNTER — Encounter: Payer: Self-pay | Admitting: Physical Therapy

## 2021-04-20 ENCOUNTER — Other Ambulatory Visit: Payer: Self-pay

## 2021-04-20 ENCOUNTER — Ambulatory Visit: Payer: No Typology Code available for payment source | Admitting: Physical Therapy

## 2021-04-20 DIAGNOSIS — R2689 Other abnormalities of gait and mobility: Secondary | ICD-10-CM

## 2021-04-20 DIAGNOSIS — R2681 Unsteadiness on feet: Secondary | ICD-10-CM

## 2021-04-20 NOTE — Patient Instructions (Signed)
Access Code: [email or web address] URL: [email or web address] Date: 04/20/2021 Prepared by: Mady Haagensen  Exercises Lateral Weight Shift - 1 x daily - 5 x weekly - 5 sets - 10 reps Alternating Step Backward with Support - 1 x daily - 5 x weekly - 1 sets - 10 reps Alternating Step Forward with Support - 1 x daily - 5 x weekly - 1 sets - 10 reps Step Sideways - 1 x daily - 5 x weekly - 1 sets - 10 reps Standing Quarter Turn with Counter Support - 1 x daily - 5 x weekly - 1 sets - 5 reps

## 2021-04-20 NOTE — Therapy (Signed)
[facility] [address] [place], [place], [address] Phone: [phone]   Fax:  [phone]  Physical Therapy Treatment  Patient Details  Name: Lee [name] MRN: [id number] Date of Birth: 01/28/1946 Referring Provider (PT): Landry Mellow, MD ([facility])   Encounter Date: 04/20/2021   PT End of Session - 04/20/21 1544     Visit Number 7    Number of Visits 15    Date for PT Re-Evaluation 05/12/21    Authorization Type VA approved 15 visits    Authorization - Visit Number 7    Authorization - Number of Visits 15    PT Start Time 2951    PT Stop Time 1402    PT Time Calculation (min) 44 min    Activity Tolerance Patient tolerated treatment well    Behavior During Therapy [facility] for tasks assessed/performed             Past Medical History:  Diagnosis Date   Acid reflux    Alcohol abuse    quit 10 y ago   Arthritis    BPH (benign prostatic hyperplasia)    CAD (coronary artery disease)    Cervical spondylosis without myelopathy 06/02/2015   COPD (chronic obstructive pulmonary disease) ([place])    Emphysema lung ([place])    GERD (gastroesophageal reflux disease)    High cholesterol    Hypertension    Parkinson's disease ([place])    PVD (peripheral vascular disease) ([place])    Tremor 06/02/2015    Past Surgical History:  Procedure Laterality Date   TONSILLECTOMY     VASECTOMY      There were no vitals filed for this visit.   Subjective Assessment - 04/20/21 1324     Subjective Woke up very tired today.  Just worn out and all I did was get up and get ready.  Reports today that he talked with Dr. [name] and he is back on the Sinemet (do not see this in his chart?)    Pertinent History PMH:  neck and back pain, PD -dx in March 2022 per pt report.  (just started back on Sinemet), hx of ETOH abuse, CAD, COPD, emphysema, GERD, HTN, high cholesterol, PVD    How long can you walk comfortably? just walking around the  house    Patient Stated Goals Pt's goals for therapy to get rid of the pain and move better.    Currently in Pain? No/denies    Pain Onset More than a month ago                               [facility] Adult PT Treatment/Exercise - 04/20/21 0001       Transfers   Transfers Sit to Stand;Stand to Sit    Sit to Stand 6: Modified independent (Device/Increase time);Without upper extremity assist;From chair/3-in-1    Stand to Sit 6: Modified independent (Device/Increase time);Without upper extremity assist;To chair/3-in-1    Number of Reps Other reps (comment)   at least 5 reps throughout session     Ambulation/Gait   Ambulation/Gait Yes    Ambulation/Gait Assistance 5: Supervision    Ambulation Distance (Feet) 80 Feet   x 2, 60 x 2, 120   Assistive device Straight cane    Gait Pattern Step-through pattern;Decreased step length - right;Decreased step length - left;Shuffle;Festinating;Narrow base of support;Poor foot clearance - left;Poor foot clearance - right    Ambulation Surface Level;Indoor  Gait Comments Practiced wide U-turn walking for wider turns to decrease festination with turns. Practiced x 3 reps each direction around 2 cones approx 8 ft apart.      High Level Balance   High Level Balance Activities Other (comment)   Turning practice   High Level Balance Comments Quarter turns at counter, 3 reps each direction      Knee/Hip Exercises: Aerobic   Stepper Seated stepper, Level 1.8, 4 extremities x 6 minutes, BLEs only x 2 minutes, cues to keep steps/min >80 for increased intensity, large amplitude movement patterns.                 Balance Exercises - 04/20/21 0001       Balance Exercises: Standing   Stepping Strategy Anterior;Posterior;Lateral;UE support;Limitations    Stepping Strategy Limitations Cues for increased foot clearance, step height.            Increased time for technique for step and weightshift instruction; at least 10 reps plus  initial instruction.    PT Education - 04/20/21 1543     Education Details additions to HEP-see instructions    Person(s) Educated Patient    Methods Explanation;Demonstration;Handout    Comprehension Verbalized understanding;Returned demonstration              PT Short Term Goals - 04/13/21 1531       PT SHORT TERM GOAL #1   Title Pt will be independent with HEP for improved strength, balance, transfers, and gait.  TARGET 04/14/2021    Time 4    Period Weeks    Status Achieved      PT SHORT TERM GOAL #2   Title Pt will verbalize tips to reduce festination/freezing with gait and turns.    Baseline initated and practiced; needs reminder cues    Time 4    Period Weeks    Status Partially Met      PT SHORT TERM GOAL #3   Title Pt will improve 5x sit<>stand to less than or equal to 15 sec to demonstrate improved functional strength and transfer efficiency.    Baseline 17.93 sec; 18.12 sec 04/13/2021    Time 4    Period Weeks    Status Not Met      PT SHORT TERM GOAL #4   Title Pt will improve TUG score to less than or equal to 15 sec for decreased fall risk    Baseline 19.91 sec; 12/13 sec 04/13/21, but borderline hastening with gait    Time 4    Period Weeks    Status Achieved      PT SHORT TERM GOAL #5   Title Pt will verbalize understanding of local Parkinson's disease resources.    Baseline provided on 04/10/21, pt reports he will look at them later after having follow up visits with his physicians    Time 4    Period Weeks    Status Achieved               PT Long Term Goals - 03/24/21 1437       PT LONG TERM GOAL #1   Title Pt will be independent with progression of HEP for improved strength, balance, decreased pain, transfers, and gait.  TARGET 05/12/21    Time 8    Period Weeks    Status New      PT LONG TERM GOAL #2   Title Pt will improve gait velocity to at least 2.2 ft/sec for improved gait  efficiency and safety.    Baseline 1.76 ft/sec     Time 8    Period Weeks    Status New      PT LONG TERM GOAL #3   Title Pt will improve 5x sit<>stand to less than or equal to 12.6 sec to demonstrate improved functional strength and transfer efficiency.    Time 8    Period Weeks    Status New      PT LONG TERM GOAL #4   Title Pt will improve TUG score to less than or equal to 13.5 sec for decreased fall risk.    Time 8    Period Weeks    Status New      PT LONG TERM GOAL #5   Title Pt will ambulate at least 1000 ft indoor and outdoor surfaces, appropriate assistive device, mod independently, for imrpoved community gait.    Time 8    Period Weeks    Status New                   Plan - 04/20/21 1545     Clinical Impression Statement Pt continues to respond well to cues for turning practice; however, without cues, pt tends to revert back to shuffling pattern with gait.  PT provides verbal and tactile cues for standing step and weigthshfiting activities to help with foot clearance and smooth weigthshift to return to midline.  With repetition, pt is able to improve.  He will continue to benefit from skilled PT to further address balance and gait for improved overall functional mobility and decreased fall risk.    Personal Factors and Comorbidities Comorbidity 3+    Comorbidities PMH:  neck and back pain, PD (no Sinemet), hx of ETOH abuse, CAD, COPD, emphysema, GERD, HTN, high cholesterol, PVD    Examination-Activity Limitations Locomotion Level;Transfers;Stand    Examination-Participation Restrictions Community Activity;Other   community fitness   Stability/Clinical Decision Making Evolving/Moderate complexity    Rehab Potential Good    PT Frequency 2x / week    PT Duration --   7 weeks, plus eval visit; total 8 weeks, including eval wee   PT Treatment/Interventions ADLs/Self Care Home Management;Gait training;Stair training;Functional mobility training;Therapeutic activities;Therapeutic exercise;Balance training;Neuromuscular  re-education;DME Instruction;Manual techniques;Patient/family education;Passive range of motion    PT Next Visit Plan Review additions to HEP.  Step strategies, foot clearance for balance. RLE foot clearance. gait training, continue with PD education, review tips to reduce festination with gait and turns    Consulted and Agree with Plan of Care Patient             Patient will benefit from skilled therapeutic intervention in order to improve the following deficits and impairments:  Abnormal gait, Difficulty walking, Decreased balance, Decreased mobility, Decreased strength, Pain, Postural dysfunction (stiffness noted through trunk)  Visit Diagnosis: Other abnormalities of gait and mobility  Unsteadiness on feet     Problem List Patient Active Problem List   Diagnosis Date Noted   Depression 04/13/2021   Parkinson's disease ([place]) 02/28/2021   Weakness of both legs 02/01/2021   Dysphagia 02/01/2021   Prostate cancer ([place]) 06/09/2020   Well adult exam 06/04/2019   BPH (benign prostatic hyperplasia) 06/04/2019   Bilateral carotid bruits 06/04/2019   Meniere disease, 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   Emphysema of lung ([place]) 06/04/2019   Spondylolisthesis at L4-L5 level 08/27/2017   Spinal stenosis in cervical region 08/27/2017   Tremor 06/02/2015   Cervical spondylosis without myelopathy 06/02/2015   Alcoholism  in remission ([place]) 06/09/2014   HIP PAIN 11/27/2007   Essential hypertension 07/25/2007   Atrial fibrillation ([place]) 07/25/2007   GERD 07/25/2007   ERECTILE DYSFUNCTION 07/25/2007   LOW BACK PAIN 07/25/2007    Kimari Lienhard W., PT 04/20/2021, 3:50 PM  [facility] [address] [place], [place], [address] Phone: [phone]   Fax:  [phone]  Name: Lavin Petteway MRN: [id number] Date of Birth: 1945/10/17

## 2021-04-24 ENCOUNTER — Ambulatory Visit: Payer: No Typology Code available for payment source | Admitting: Physical Therapy

## 2021-04-24 ENCOUNTER — Encounter: Payer: Self-pay | Admitting: Physical Therapy

## 2021-04-24 ENCOUNTER — Other Ambulatory Visit: Payer: Self-pay

## 2021-04-24 DIAGNOSIS — M6281 Muscle weakness (generalized): Secondary | ICD-10-CM

## 2021-04-24 DIAGNOSIS — R2681 Unsteadiness on feet: Secondary | ICD-10-CM

## 2021-04-24 DIAGNOSIS — R2689 Other abnormalities of gait and mobility: Secondary | ICD-10-CM | POA: Diagnosis not present

## 2021-04-24 NOTE — Therapy (Signed)
[facility] [address] [place], [place], [address] Phone: [phone]   Fax:  [phone]  Physical Therapy Treatment  Patient Details  Name: Lee [name] MRN: [id number] Date of Birth: [birth year] Referring Provider (PT): Landry Mellow, MD ([facility])   Encounter Date: 04/24/2021   PT End of Session - 04/24/21 1505     Visit Number 8    Number of Visits 15    Date for PT Re-Evaluation 05/12/21    Authorization Type VA approved 15 visits    Authorization - Visit Number 7    Authorization - Number of Visits 15    PT Start Time 1320    PT Stop Time 1404    PT Time Calculation (min) 44 min    Activity Tolerance Patient tolerated treatment well    Behavior During Therapy [facility] for tasks assessed/performed             Past Medical History:  Diagnosis Date   Acid reflux    Alcohol abuse    quit 10 y ago   Arthritis    BPH (benign prostatic hyperplasia)    CAD (coronary artery disease)    Cervical spondylosis without myelopathy 06/02/2015   COPD (chronic obstructive pulmonary disease) ([place])    Emphysema lung ([place])    GERD (gastroesophageal reflux disease)    High cholesterol    Hypertension    Parkinson's disease ([place])    PVD (peripheral vascular disease) ([place])    Tremor 06/02/2015    Past Surgical History:  Procedure Laterality Date   TONSILLECTOMY     VASECTOMY      There were no vitals filed for this visit.   Subjective Assessment - 04/24/21 1322     Subjective Pt just feels like he is not making any progress with PT.  I did the exercises, just a little bit.    Pertinent History PMH:  neck and back pain, PD -dx in March 2022 per pt report.  (just started back on Sinemet), hx of ETOH abuse, CAD, COPD, emphysema, GERD, HTN, high cholesterol, PVD    How long can you walk comfortably? just walking around the house    Patient Stated Goals Pt's goals for therapy to get rid of the pain and move  better.    Currently in Pain? No/denies    Pain Onset More than a month ago                               [facility] Adult PT Treatment/Exercise - 04/24/21 0001       Self-Care   Self-Care Other Self-Care Comments    Other Self-Care Comments  Pt continues to feel that PT is not helping, he conitnues to feel wiped out-physically and mentally.  He wants to have an MRI to make sure nothing else is causing his movement difficulties.  PT discsused that if pt truly feels he is worsening, he needs to contact neurologist and he should journal his symptoms to better communicate with MD.  Provided handouts from [facility] [employer] Manual for pt to look over/journal prior to seeing MD again regarding PD symptoms.  Discussed again, that with PD, we are managing his symptoms through exercises and through repetition of strategies to help him move better.  Discussed putting him on hold for therapy until he can follow up with MD, since this is a conversation he brings up weekly in our sessions.  Ultimately, he decides to continue with and go through full POC at this time.      Knee/Hip Exercises: Aerobic   Stepper Seated stepper, Level 2, 4 extremities x 10 minutes, cues to keep steps/min >90 for increased intensity, large amplitude movement patterns.              Reviewed the exercises in bold and educated pt in the connection between each exercise and functional mobility/activities in his day.   Access Code: [email or web address] URL: [email or web address] Date: 04/20/2021 Prepared by: Mady Haagensen   Exercises-Reviewed most recent additions to HEP Lateral Weight Shift - 1 x daily - 5 x weekly - 5 sets - 10 reps Alternating Step Backward with Support - 1 x daily - 5 x weekly - 1 sets - 10 reps Alternating Step Forward with Support - 1 x daily - 5 x weekly - 1 sets - 10 reps Step Sideways - 1 x daily - 5 x weekly - 1 sets - 10 reps Standing Quarter Turn with Counter  Support - 1 x daily - 5 x weekly - 1 sets - 5 reps        PT Education - 04/24/21 1504     Education Details see self-care    Person(s) Educated Patient    Methods Explanation;Handout    Comprehension Verbalized understanding              PT Short Term Goals - 04/13/21 1531       PT SHORT TERM GOAL #1   Title Pt will be independent with HEP for improved strength, balance, transfers, and gait.  TARGET 04/14/2021    Time 4    Period Weeks    Status Achieved      PT SHORT TERM GOAL #2   Title Pt will verbalize tips to reduce festination/freezing with gait and turns.    Baseline initated and practiced; needs reminder cues    Time 4    Period Weeks    Status Partially Met      PT SHORT TERM GOAL #3   Title Pt will improve 5x sit<>stand to less than or equal to 15 sec to demonstrate improved functional strength and transfer efficiency.    Baseline 17.93 sec; 18.12 sec 04/13/2021    Time 4    Period Weeks    Status Not Met      PT SHORT TERM GOAL #4   Title Pt will improve TUG score to less than or equal to 15 sec for decreased fall risk    Baseline 19.91 sec; 12/13 sec 04/13/21, but borderline hastening with gait    Time 4    Period Weeks    Status Achieved      PT SHORT TERM GOAL #5   Title Pt will verbalize understanding of local Parkinson's disease resources.    Baseline provided on 04/10/21, pt reports he will look at them later after having follow up visits with his physicians    Time 4    Period Weeks    Status Achieved               PT Long Term Goals - 03/24/21 1437       PT LONG TERM GOAL #1   Title Pt will be independent with progression of HEP for improved strength, balance, decreased pain, transfers, and gait.  TARGET 05/12/21    Time 8    Period Weeks    Status New      PT LONG TERM  GOAL #2   Title Pt will improve gait velocity to at least 2.2 ft/sec for improved gait efficiency and safety.    Baseline 1.76 ft/sec    Time 8    Period  Weeks    Status New      PT LONG TERM GOAL #3   Title Pt will improve 5x sit<>stand to less than or equal to 12.6 sec to demonstrate improved functional strength and transfer efficiency.    Time 8    Period Weeks    Status New      PT LONG TERM GOAL #4   Title Pt will improve TUG score to less than or equal to 13.5 sec for decreased fall risk.    Time 8    Period Weeks    Status New      PT LONG TERM GOAL #5   Title Pt will ambulate at least 1000 ft indoor and outdoor surfaces, appropriate assistive device, mod independently, for imrpoved community gait.    Time 8    Period Weeks    Status New                   Plan - 04/24/21 1505     Clinical Impression Statement Had discussion with pt in response to his feeling that symptoms are worsening.  Provided him with handouts for journaling symptoms in preparation for follow up conversation/visit with neurologist.  Discussed and reviewed (from previous conversations) that PT is working on strategies to manage his PD symptoms and repetition, practice through PT sessions and carryover practice at home is going to be how he sees improvements.  Due to pt's continued concern about progress with PT, therapist gave pt the option to go on hold for PT at this time to try to follow up with neurologist, however, he does want to continue through current POC.  He will benefit from continued skilled PT to address movement patterns/strategies to assist with turns, initiation of gait to decrease fall risk and imrpove overall funcitonal moiblity.    Personal Factors and Comorbidities Comorbidity 3+    Comorbidities PMH:  neck and back pain, PD (no Sinemet), hx of ETOH abuse, CAD, COPD, emphysema, GERD, HTN, high cholesterol, PVD    Examination-Activity Limitations Locomotion Level;Transfers;Stand    Examination-Participation Restrictions Community Activity;Other   community fitness   Stability/Clinical Decision Making Evolving/Moderate complexity     Rehab Potential Good    PT Frequency 2x / week    PT Duration --   7 weeks, plus eval visit; total 8 weeks, including eval wee   PT Treatment/Interventions ADLs/Self Care Home Management;Gait training;Stair training;Functional mobility training;Therapeutic activities;Therapeutic exercise;Balance training;Neuromuscular re-education;DME Instruction;Manual techniques;Patient/family education;Passive range of motion    PT Next Visit Plan Review again recent additions to HEP and educate pt how these are helpful in fucntional activiteis..  Continue to work on turns, changes of direction; Step strategies, foot clearance for balance. RLE foot clearance. gait training, continue with PD education, review tips to reduce festination with gait and turns.  Connect therapy exercises with functional movement activities to help with [place] with pt.    Consulted and Agree with Plan of Care Patient             Patient will benefit from skilled therapeutic intervention in order to improve the following deficits and impairments:  Abnormal gait, Difficulty walking, Decreased balance, Decreased mobility, Decreased strength, Pain, Postural dysfunction (stiffness noted through trunk)  Visit Diagnosis: Other abnormalities of gait and mobility  Muscle weakness (generalized)  Unsteadiness on feet     Problem List Patient Active Problem List   Diagnosis Date Noted   Depression 04/13/2021   Parkinson's disease ([place]) 02/28/2021   Weakness of both legs 02/01/2021   Dysphagia 02/01/2021   Prostate cancer ([place]) 06/09/2020   Well adult exam 06/04/2019   BPH (benign prostatic hyperplasia) 06/04/2019   Bilateral carotid bruits 06/04/2019   Meniere disease, 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 [place], [place], [address] Phone: [phone]   Fax:  [phone]  Name: Lee [name] MRN: [id number] Date of Birth: 1946-06-14

## 2021-04-25 ENCOUNTER — Institutional Professional Consult (permissible substitution): Payer: No Typology Code available for payment source | Admitting: Neurology

## 2021-04-26 ENCOUNTER — Ambulatory Visit: Payer: No Typology Code available for payment source | Admitting: Physical Therapy

## 2021-04-26 ENCOUNTER — Encounter: Payer: Self-pay | Admitting: Physical Therapy

## 2021-04-26 ENCOUNTER — Other Ambulatory Visit: Payer: Self-pay

## 2021-04-26 DIAGNOSIS — M6281 Muscle weakness (generalized): Secondary | ICD-10-CM

## 2021-04-26 DIAGNOSIS — R2681 Unsteadiness on feet: Secondary | ICD-10-CM

## 2021-04-26 DIAGNOSIS — R2689 Other abnormalities of gait and mobility: Secondary | ICD-10-CM | POA: Diagnosis not present

## 2021-04-26 NOTE — Therapy (Addendum)
[facility] [address] [place], [place], [address] Phone: [phone]   Fax:  [phone]  Physical Therapy Treatment  Patient Details  Name: Lee [name] MRN: [id number] Date of Birth: 30-Jan-1946 Referring Provider (PT): Landry Mellow, MD ([place])   Encounter Date: 04/26/2021   PT End of Session - 04/26/21 1400     Visit Number 9    Number of Visits 15    Date for PT Re-Evaluation 05/12/21    Authorization Type VA approved 15 visits    Authorization - Visit Number 8    Authorization - Number of Visits 15    PT Start Time 3716    PT Stop Time 9678    PT Time Calculation (min) 41 min    Activity Tolerance Patient tolerated treatment well    Behavior During Therapy [facility] for tasks assessed/performed             Past Medical History:  Diagnosis Date   Acid reflux    Alcohol abuse    quit 10 y ago   Arthritis    BPH (benign prostatic hyperplasia)    CAD (coronary artery disease)    Cervical spondylosis without myelopathy 06/02/2015   COPD (chronic obstructive pulmonary disease) ([place])    Emphysema lung ([place])    GERD (gastroesophageal reflux disease)    High cholesterol    Hypertension    Parkinson's disease ([place])    PVD (peripheral vascular disease) ([place])    Tremor 06/02/2015    Past Surgical History:  Procedure Laterality Date   TONSILLECTOMY     VASECTOMY      There were no vitals filed for this visit.   Subjective Assessment - 04/26/21 1319     Subjective States he went on a walk after his PT session on Monday. Tried the exercises at home a little bit. Reports he has an appt with the doctor on the 4th.    Pertinent History PMH:  neck and back pain, PD -dx in March 2022 per pt report.  (just started back on Sinemet), hx of ETOH abuse, CAD, COPD, emphysema, GERD, HTN, high cholesterol, PVD    How long can you walk comfortably? just walking around the house    Patient Stated Goals Pt's goals  for therapy to get rid of the pain and move better.    Currently in Pain? No/denies    Pain Onset More than a month ago                    Access Code: [email or web address] URL: [email or web address] Date: 04/26/2021 Prepared by: Janann August   Reviewed bolded exercises and purpose of performing/how they relate to functional activity:  Exercises Lateral Weight Shift - 1 x daily - 5 x weekly - 5 sets - 10 reps Alternating Step Backward with Support - 1 x daily - 5 x weekly - 1 sets - 10 reps Alternating Step Forward with Support - 1 x daily - 5 x weekly - 1 sets - 10 reps - cues for foot clearance Step Sideways - 1 x daily - 5 x weekly - 1 sets - 10 reps - performed an additional x10 reps bilat over 4" obstacle for improved foot clearance  Standing Quarter Turn with Counter Support - 1 x daily - 5 x weekly - 1 sets - 5 reps      Reporting a 3/4 pain in R lateral knee after standing stepping exercises.       [place]  Adult PT Treatment/Exercise - 04/26/21 1325       High Level Balance   High Level Balance Activities Other (comment)    High Level Balance Comments Standing at counter, practiced step and weight shift for quarter turns (working in directions of a clock) - step to 3:00, back to 12:00, and then repeated to 9:00 direction, performed bilateral step out and then back to midline x10 reps bilat with initial cues for foot clearance and step length when beginning to initiate quarter turn. Needing single UE support when performing      Self-Care   Self-Care Other Self-Care Comments    Other Self-Care Comments  Pt asking again during this session if PT can "fix" his Parkinsons. Continued to discuss with pt that with PD in PT, we are working on managing his sx and working on his balance/optimal movement patterns to help him move better and learn strategies. Pt also asking therapist to look at his RLE as it is more swollen (more so at the end of the day). PT checked  pt's RLE and compared to LLE with no swelling noted. Discussed with pt that this would be something he should write down in his journal/log provided at last session to bring to MD      Knee/Hip Exercises: Aerobic   Stepper Seated stepper, Level 2, 4 extremities x 8 minutes, cues to keep steps/min >90 for increased intensity, large amplitude movement patterns. Needed 2 rest breaks at 3 minutes and then 6 minutes for pt to catch his breath due to emphysema, performing pursed lip breathing. O2 at 94% that increased up to 96-98% with HR at 100 bpm after activity.                 Balance Exercises - 04/26/21 1356       Balance Exercises: Standing   Retro Gait 3 reps;Limitations    Retro Gait Limitations down and back at countertop, cued for hip hinge, step length, slowing pace, use of clock turns with incr foot clearance when turning around to face opposite direction with UE support at counter                PT Education - 04/26/21 1358     Education Details see Self care section    Person(s) Educated Patient    Methods Explanation    Comprehension Verbalized understanding              PT Short Term Goals - 04/13/21 1531       PT SHORT TERM GOAL #1   Title Pt will be independent with HEP for improved strength, balance, transfers, and gait.  TARGET 04/14/2021    Time 4    Period Weeks    Status Achieved      PT SHORT TERM GOAL #2   Title Pt will verbalize tips to reduce festination/freezing with gait and turns.    Baseline initated and practiced; needs reminder cues    Time 4    Period Weeks    Status Partially Met      PT SHORT TERM GOAL #3   Title Pt will improve 5x sit<>stand to less than or equal to 15 sec to demonstrate improved functional strength and transfer efficiency.    Baseline 17.93 sec; 18.12 sec 04/13/2021    Time 4    Period Weeks    Status Not Met      PT SHORT TERM GOAL #4   Title Pt will improve TUG score to  less than or equal to 15 sec for  decreased fall risk    Baseline 19.91 sec; 12/13 sec 04/13/21, but borderline hastening with gait    Time 4    Period Weeks    Status Achieved      PT SHORT TERM GOAL #5   Title Pt will verbalize understanding of local Parkinson's disease resources.    Baseline provided on 04/10/21, pt reports he will look at them later after having follow up visits with his physicians    Time 4    Period Weeks    Status Achieved               PT Long Term Goals - 03/24/21 1437       PT LONG TERM GOAL #1   Title Pt will be independent with progression of HEP for improved strength, balance, decreased pain, transfers, and gait.  TARGET 05/12/21    Time 8    Period Weeks    Status New      PT LONG TERM GOAL #2   Title Pt will improve gait velocity to at least 2.2 ft/sec for improved gait efficiency and safety.    Baseline 1.76 ft/sec    Time 8    Period Weeks    Status New      PT LONG TERM GOAL #3   Title Pt will improve 5x sit<>stand to less than or equal to 12.6 sec to demonstrate improved functional strength and transfer efficiency.    Time 8    Period Weeks    Status New      PT LONG TERM GOAL #4   Title Pt will improve TUG score to less than or equal to 13.5 sec for decreased fall risk.    Time 8    Period Weeks    Status New      PT LONG TERM GOAL #5   Title Pt will ambulate at least 1000 ft indoor and outdoor surfaces, appropriate assistive device, mod independently, for imrpoved community gait.    Time 8    Period Weeks    Status New                   Plan - 04/27/21 0907     Clinical Impression Statement Continued to work balance strategies today with retro gait, stepping strategies, and turns - with cues for incr step length and foot clearance and tied them in with functional activities. Pt continues to mention during session today about if PT "can fix his PD" with therapist continuing to educate pt that we are working on optimal movement patterns and  strategies to help pt move better, help with PD sx and help with his balance. Pt more SOB today with seated SciFit due to pt's emphysema, needed 2 seated rest breaks and cues for pursed lip breathing with O2 at 94% and incr to 96-98%. Will continue to progress towards LTGs.    Personal Factors and Comorbidities Comorbidity 3+    Comorbidities PMH:  neck and back pain, PD (no Sinemet), hx of ETOH abuse, CAD, COPD, emphysema, GERD, HTN, high cholesterol, PVD    Examination-Activity Limitations Locomotion Level;Transfers;Stand    Examination-Participation Restrictions Community Activity;Other   community fitness   Stability/Clinical Decision Making Evolving/Moderate complexity    Rehab Potential Good    PT Frequency 2x / week    PT Duration --   7 weeks, plus eval visit; total 8 weeks, including eval wee   PT Treatment/Interventions ADLs/Self  Care Home Management;Gait training;Stair training;Functional mobility training;Therapeutic activities;Therapeutic exercise;Balance training;Neuromuscular re-education;DME Instruction;Manual techniques;Patient/family education;Passive range of motion    PT Next Visit Plan Continue to work on turns, changes of direction; Step strategies, foot clearance for balance. RLE foot clearance. gait training, continue with PD education, review tips to reduce festination with gait and turns.  Connect therapy exercises with functional movement activities to help with carrover with pt. SciFit for aerobic activity/bigger movement patterns.    Consulted and Agree with Plan of Care Patient             Patient will benefit from skilled therapeutic intervention in order to improve the following deficits and impairments:  Abnormal gait, Difficulty walking, Decreased balance, Decreased mobility, Decreased strength, Pain, Postural dysfunction (stiffness noted through trunk)  Visit Diagnosis: Other abnormalities of gait and mobility  Muscle weakness (generalized)  Unsteadiness  on feet     Problem List Patient Active Problem List   Diagnosis Date Noted   Depression 04/13/2021   Parkinson's disease ([place]) 02/28/2021   Weakness of both legs 02/01/2021   Dysphagia 02/01/2021   Prostate cancer ([place]) 06/09/2020   Well adult exam 06/04/2019   BPH (benign prostatic hyperplasia) 06/04/2019   Bilateral carotid bruits 06/04/2019   Meniere disease, 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   Emphysema of lung ([place]) 06/04/2019   Spondylolisthesis at L4-L5 level 08/27/2017   Spinal stenosis in cervical region 08/27/2017   Tremor 06/02/2015   Cervical spondylosis without myelopathy 06/02/2015   Alcoholism in remission ([place]) 06/09/2014   HIP PAIN 11/27/2007   Essential hypertension 07/25/2007   Atrial fibrillation ([place]) 07/25/2007   GERD 07/25/2007   ERECTILE DYSFUNCTION 07/25/2007   LOW BACK PAIN 07/25/2007    Arliss Journey, PT, DPT  04/27/2021, 9:12 AM  [facility] [address] [place], [place], [address] Phone: [phone]   Fax:  [phone]  Name: Lee [name] MRN: [id number] Date of Birth: 1946/05/15

## 2021-05-01 ENCOUNTER — Ambulatory Visit: Payer: No Typology Code available for payment source | Attending: Family Medicine

## 2021-05-01 ENCOUNTER — Other Ambulatory Visit: Payer: Self-pay

## 2021-05-01 DIAGNOSIS — M6281 Muscle weakness (generalized): Secondary | ICD-10-CM | POA: Diagnosis present

## 2021-05-01 DIAGNOSIS — R2689 Other abnormalities of gait and mobility: Secondary | ICD-10-CM | POA: Diagnosis present

## 2021-05-01 DIAGNOSIS — R2681 Unsteadiness on feet: Secondary | ICD-10-CM | POA: Diagnosis present

## 2021-05-01 DIAGNOSIS — R293 Abnormal posture: Secondary | ICD-10-CM | POA: Insufficient documentation

## 2021-05-01 NOTE — Therapy (Addendum)
[facility] [address] [place], [place], [address] Phone: [phone]   Fax:  [phone]  Physical Therapy Treatment  Patient Details  Name: Lee [name] MRN: [id number] Date of Birth: 06-Aug-1945 Referring Provider (PT): Landry Mellow, MD ([facility])   Encounter Date: 05/01/2021   PT End of Session - 05/01/21 1319     Visit Number 10    Number of Visits 15    Date for PT Re-Evaluation 05/12/21    Authorization Type VA approved 15 visits    Authorization - Visit Number 9    Authorization - Number of Visits 15    PT Start Time 1320    PT Stop Time 1400    PT Time Calculation (min) 40 min    Activity Tolerance Patient tolerated treatment well    Behavior During Therapy [facility] for tasks assessed/performed             Past Medical History:  Diagnosis Date   Acid reflux    Alcohol abuse    quit 10 y ago   Arthritis    BPH (benign prostatic hyperplasia)    CAD (coronary artery disease)    Cervical spondylosis without myelopathy 06/02/2015   COPD (chronic obstructive pulmonary disease) ([place])    Emphysema lung ([place])    GERD (gastroesophageal reflux disease)    High cholesterol    Hypertension    Parkinson's disease ([place])    PVD (peripheral vascular disease) ([place])    Tremor 06/02/2015    Past Surgical History:  Procedure Laterality Date   TONSILLECTOMY     VASECTOMY      There were no vitals filed for this visit.   Subjective Assessment - 05/01/21 1320     Subjective Pt reports    Pertinent History PMH:  neck and back pain, PD -dx in March 2022 per pt report.  (just started back on Sinemet), hx of ETOH abuse, CAD, COPD, emphysema, GERD, HTN, high cholesterol, PVD    How long can you walk comfortably? just walking around the house    Patient Stated Goals Pt's goals for therapy to get rid of the pain and move better.    Pain Onset More than a month ago                Discussed realistic  expectations of what to expect from physical therapy as patient is not experiencing gains from PT subjectively. And he is also experiencing some depression with this.We discussed in PT we want to educate patient on physical impairments experienced with PD and understanding on how to manage or overcome those impairments and improve safety.  Gait training: 1 x 460' no AD, cues for arm swings and step length Gait training: 1 x 575' with fast walking, sudden stops, slow walking, horizontal and vertical head turns, backwards walking 1 x 115' with walking backwards with increased step length to clear contralateral foot Stand up walk 10 feet turn around and come back and sit down: Cues for "Big" steps after standing to minimize shuffling and turning slowly with high marches and not pivoting to minimize LOB and increase stability.- practiced this 6x Pt offered resources on support group but patient declined at this time. Sit to stand: 10lbs 10x                       PT Short Term Goals - 04/13/21 1531       PT SHORT TERM GOAL #1  Title Pt will be independent with HEP for improved strength, balance, transfers, and gait.  TARGET 04/14/2021    Time 4    Period Weeks    Status Achieved      PT SHORT TERM GOAL #2   Title Pt will verbalize tips to reduce festination/freezing with gait and turns.    Baseline initated and practiced; needs reminder cues    Time 4    Period Weeks    Status Partially Met      PT SHORT TERM GOAL #3   Title Pt will improve 5x sit<>stand to less than or equal to 15 sec to demonstrate improved functional strength and transfer efficiency.    Baseline 17.93 sec; 18.12 sec 04/13/2021    Time 4    Period Weeks    Status Not Met      PT SHORT TERM GOAL #4   Title Pt will improve TUG score to less than or equal to 15 sec for decreased fall risk    Baseline 19.91 sec; 12/13 sec 04/13/21, but borderline hastening with gait    Time 4    Period Weeks    Status  Achieved      PT SHORT TERM GOAL #5   Title Pt will verbalize understanding of local Parkinson's disease resources.    Baseline provided on 04/10/21, pt reports he will look at them later after having follow up visits with his physicians    Time 4    Period Weeks    Status Achieved               PT Long Term Goals - 03/24/21 1437       PT LONG TERM GOAL #1   Title Pt will be independent with progression of HEP for improved strength, balance, decreased pain, transfers, and gait.  TARGET 05/12/21    Time 8    Period Weeks    Status New      PT LONG TERM GOAL #2   Title Pt will improve gait velocity to at least 2.2 ft/sec for improved gait efficiency and safety.    Baseline 1.76 ft/sec    Time 8    Period Weeks    Status New      PT LONG TERM GOAL #3   Title Pt will improve 5x sit<>stand to less than or equal to 12.6 sec to demonstrate improved functional strength and transfer efficiency.    Time 8    Period Weeks    Status New      PT LONG TERM GOAL #4   Title Pt will improve TUG score to less than or equal to 13.5 sec for decreased fall risk.    Time 8    Period Weeks    Status New      PT LONG TERM GOAL #5   Title Pt will ambulate at least 1000 ft indoor and outdoor surfaces, appropriate assistive device, mod independently, for imrpoved community gait.    Time 8    Period Weeks    Status New                   Plan - 05/01/21 1408     Clinical Impression Statement Today's skilled session was focused on patient education for PD pathology and management. We focused on higher level gait activities to reduce shuffling with stop and go.    Personal Factors and Comorbidities Comorbidity 3+    Comorbidities PMH:  neck and back pain, PD (no  Sinemet), hx of ETOH abuse, CAD, COPD, emphysema, GERD, HTN, high cholesterol, PVD    Examination-Activity Limitations Locomotion Level;Transfers;Stand    Examination-Participation Restrictions Community Activity;Other    community fitness   Stability/Clinical Decision Making Evolving/Moderate complexity    Rehab Potential Good    PT Frequency 2x / week    PT Duration --   7 weeks, plus eval visit; total 8 weeks, including eval wee   PT Treatment/Interventions ADLs/Self Care Home Management;Gait training;Stair training;Functional mobility training;Therapeutic activities;Therapeutic exercise;Balance training;Neuromuscular re-education;DME Instruction;Manual techniques;Patient/family education;Passive range of motion    PT Next Visit Plan Continue to work on turns, changes of direction; Step strategies, foot clearance for balance. RLE foot clearance. gait training, continue with PD education, review tips to reduce festination with gait and turns.  Connect therapy exercises with functional movement activities to help with carrover with pt. SciFit for aerobic activity/bigger movement patterns.    Consulted and Agree with Plan of Care Patient             Patient will benefit from skilled therapeutic intervention in order to improve the following deficits and impairments:  Abnormal gait, Difficulty walking, Decreased balance, Decreased mobility, Decreased strength, Pain, Postural dysfunction (stiffness noted through trunk)  Visit Diagnosis: Other abnormalities of gait and mobility  Muscle weakness (generalized)  Unsteadiness on feet  Abnormal posture     Problem List Patient Active Problem List   Diagnosis Date Noted   Depression 04/13/2021   Parkinson's disease ([place]) 02/28/2021   Weakness of both legs 02/01/2021   Dysphagia 02/01/2021   Prostate cancer ([place]) 06/09/2020   Well adult exam 06/04/2019   BPH (benign prostatic hyperplasia) 06/04/2019   Bilateral carotid bruits 06/04/2019   Meniere disease, 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 [place], [place], [address] Phone: [phone]   Fax:  [phone]  Name: Lee [name] MRN: [id number] Date of Birth: [birth year]

## 2021-05-02 ENCOUNTER — Telehealth: Payer: Self-pay

## 2021-05-02 NOTE — Chronic Care Management (AMB) (Signed)
Chronic Care Management Pharmacy Assistant   Name: Lee [name]  MRN: [id number] DOB: 04-15-1946  Lee [name] is an 75 y.o. year old male who presents for his initial CCM visit with the clinical pharmacist.  Recent office visits:  04/13/21 Lew Dawes MD PCP- pt was seen for weakness in both legs. Provider discontinued Dextromethorphan guaiFenesin 10-100 mg. F/u in 3 months.  03/01/21 Aleksei [name] MD- pt was seen for weakness in both legs. No labs or med changes and no follow ups are noted.  02/01/21 Tyrone Apple [name] MD- pt was seen for Parethesias. Labs were ordered and Neurology referral was placed. Provider discontinued Symbicort and no f/u was noted.   Recent consult visits:  04/26/21 Janann August PT- Pt seen for outpatient therapy due to parkinsons.  03/02/21 Penumalli, Earlean Polka, MD Neurology- pt was seen for NCS (NERVE CONDUCTION STUDY) WITH EMG (ELECTROMYOGRAPHY).  02/28/21 Kathrynn Ducking, MD Neurology- pt was seen for right leg weakness. Pthad NCS with EMG done and started on Gabapentin 100 mg 1 capsule TID for 1 week then 2 capsules TID. Follow up in 4 months.  Hospital visits:  Medication Reconciliation was completed by comparing discharge summary, patient's EMR and Pharmacy list, and upon discussion with patient.  Admitted to the hospital on 11/07/20 due to dog bite. Discharge date was 11/08/20. Discharged from [facility]?Medications Started at [facility] Discharge:?? -started none  Medication Changes at Hospital Discharge: -Changed none  Medications Discontinued at Hospital Discharge: -Stopped none  Medications that remain the same after Hospital Discharge:??  -All other medications will remain the same.    Medications: Outpatient Encounter Medications as of 05/02/2021  Medication Sig   albuterol (PROVENTIL HFA;VENTOLIN HFA) 108 (90 Base) MCG/ACT inhaler Inhale 2 puffs into the lungs every 4 (four) hours as  needed for wheezing or shortness of breath.   Capsaicin 0.1 % CREA APPLY THIN LAYER TO AFFECTED AREA TWICE A DAY AS NEEDED   carbidopa-levodopa (SINEMET IR) 25-100 MG tablet 1.5 tablets 3 times daily for 1 month, then take 2 tablets 3 times daily   CINNAMON PO TAKE 1 CAP/TAB BY MOUTH DAILY   diclofenac Sodium (VOLTAREN) 1 % GEL Apply topically.   Ergocalciferol (VITAMIN D2) 50 MCG (2000 UT) TABS Take by mouth.   FEROSUL 325 (65 Fe) MG tablet Take by mouth.   fluticasone (FLONASE) 50 MCG/ACT nasal spray INSTILL 1 SPRAY IN EACH NOSTRIL TWICE A DAY FOR DIZZINESS   folic acid (FOLVITE) 1 MG tablet Take 1 mg by mouth daily.   Ginger, Zingiber officinalis, (GINGER PO) TAKE 1 CAP/TAB BY MOUTH DAILY   mometasone-formoterol (DULERA) 100-5 MCG/ACT AERO INHALE 2 INHALATIONS BY MOUTH TWICE A DAY (RINSE MOUTH WELL WITH WATER AFTER EACH USE) FOR BREATHING, COPD, ASTHMA.  REPLACES [place].   Multiple Vitamin (QUINTABS) TABS Take by mouth.   mupirocin ointment (BACTROBAN) 2 % SMARTSIG:1 Application Topical 2-3 Times Daily   Omega-3 Fatty Acids (FISH OIL PO) Take by mouth.   oxybutynin (DITROPAN-XL) 10 MG 24 hr tablet Take 10 mg by mouth at bedtime.   pantoprazole (PROTONIX) 40 MG tablet Take by mouth.   rasagiline (AZILECT) 0.5 MG TABS tablet Take by mouth. Take 1 by mouth daily   tamsulosin (FLOMAX) 0.4 MG CAPS capsule Take 0.4 mg by mouth.   Tiotropium Bromide Monohydrate 2.5 MCG/ACT AERS Inhale 1 puff into the lungs daily.   Turmeric (QC TUMERIC COMPLEX PO) Take by mouth.   Turmeric  POWD Take 5 mLs by mouth daily.   vitamin C (ASCORBIC ACID) 500 MG tablet Take by mouth.   No facility-administered encounter medications on file as of 05/02/2021.    Current Medication List  carbidopa-levodopa (SINEMET IR) 25-100 MG tablet last filled 03/28/21 90 DS CINNAMON PO diclofenac Sodium (VOLTAREN) 1 % GEL last filled 08/30/2020 30 DS Ergocalciferol (VITAMIN D2) 50 MCG (2000 UT) TABS  FEROSUL 325  (65 Fe) MG tablet fluticasone (FLONASE) 50 MCG/ACT nasal spray last filled 16/10/960454 DS folic acid (FOLVITE) 1 MG tablet last filled 03/14/2021 10 DS Ginger, Zingiber officinalis, (GINGER PO) mometasone-formoterol 100-5 MCG/ACT AERO last filled 08/20/2020 90 DS Multiple Vitamin (QUINTABS) TABS mupirocin ointment (BACTROBAN) 2 % last fill 04/04/202230 DS Omega-3 Fatty Acids (FISH OIL PO) oxybutynin (DITROPAN-XL) 10 MG 24 hr tablet last filled 01/05/2021 90 DS pantoprazole (PROTONIX) 40 MG tablet last filled 05/11/2020 90 DS rasagiline (AZILECT) 0.5 MG TABS tablet last filled tamsulosin (FLOMAX) 0.4 MG CAPS capsule last filled 03/09/2021 90 DS Tiotropium Bromide Monohydrate 2.5 MCG/ACT last filled 01/25/2021 90 DS Turmeric (QC TUMERIC COMPLEX PO) Turmeric POWD vitamin C (ASCORBIC ACID) 500 MG tablet  [place] Pharmacist Assistant [phone]

## 2021-05-03 ENCOUNTER — Encounter: Payer: Self-pay | Admitting: Physical Therapy

## 2021-05-03 ENCOUNTER — Ambulatory Visit: Payer: No Typology Code available for payment source | Admitting: Physical Therapy

## 2021-05-03 ENCOUNTER — Other Ambulatory Visit: Payer: Self-pay

## 2021-05-03 DIAGNOSIS — R2689 Other abnormalities of gait and mobility: Secondary | ICD-10-CM | POA: Diagnosis not present

## 2021-05-03 DIAGNOSIS — M6281 Muscle weakness (generalized): Secondary | ICD-10-CM

## 2021-05-03 DIAGNOSIS — R2681 Unsteadiness on feet: Secondary | ICD-10-CM

## 2021-05-03 DIAGNOSIS — R293 Abnormal posture: Secondary | ICD-10-CM

## 2021-05-03 NOTE — Therapy (Addendum)
[facility] [address] [place], [place], [address] Phone: [phone]   Fax:  [phone]  Physical Therapy Treatment  Patient Details  Name: Lee [name] MRN: [id number] Date of Birth: 03/16/1946 Referring Provider (PT): Landry Mellow, MD ([facility])   Encounter Date: 05/03/2021   PT End of Session - 05/03/21 1357     Visit Number 11    Number of Visits 15    Date for PT Re-Evaluation 05/12/21    Authorization Type VA approved 15 visits    Authorization - Visit Number 10    Authorization - Number of Visits 15    PT Start Time 1696    PT Stop Time 1357    PT Time Calculation (min) 40 min    Activity Tolerance Patient tolerated treatment well    Behavior During Therapy [facility] for tasks assessed/performed             Past Medical History:  Diagnosis Date   Acid reflux    Alcohol abuse    quit 10 y ago   Arthritis    BPH (benign prostatic hyperplasia)    CAD (coronary artery disease)    Cervical spondylosis without myelopathy 06/02/2015   COPD (chronic obstructive pulmonary disease) ([place])    Emphysema lung ([place])    GERD (gastroesophageal reflux disease)    High cholesterol    Hypertension    Parkinson's disease ([place])    PVD (peripheral vascular disease) ([place])    Tremor 06/02/2015    Past Surgical History:  Procedure Laterality Date   TONSILLECTOMY     VASECTOMY      There were no vitals filed for this visit.   Subjective Assessment - 05/03/21 1319     Subjective Reports everything is the same. No falls. Exercises are going well at home.    Pertinent History PMH:  neck and back pain, PD -dx in March 2022 per pt report.  (just started back on Sinemet), hx of ETOH abuse, CAD, COPD, emphysema, GERD, HTN, high cholesterol, PVD    How long can you walk comfortably? just walking around the house    Patient Stated Goals Pt's goals for therapy to get rid of the pain and move better.    Currently in  Pain? No/denies    Pain Onset More than a month ago                               [facility] Adult PT Treatment/Exercise - 05/03/21 0001       Ambulation/Gait   Ambulation/Gait Yes    Ambulation/Gait Assistance 5: Supervision    Ambulation/Gait Assistance Details With no AD - working on incr step length and incr arm swing with opening up hands    Ambulation Distance (Feet) 230 Feet    Assistive device None    Gait Pattern Step-through pattern;Decreased step length - right;Decreased step length - left;Shuffle;Festinating;Narrow base of support;Poor foot clearance - left;Poor foot clearance - right              Pt performs PWR! Moves in standing position:   PWR! Up for improved posture x10 reps  PWR! Rock for improved weighshifting x10 reps through hips, x10 reps bilat with reaching and looking up towards hands  PWR! Twist for improved trunk rotation x7 reps bilat, cues to reset in the middle with tall posture  PWR! Step for improved step initiation 2 x 5 reps bilat, with  cues for big reach and foot clearance   Cues provided for technique, relation to function, and intensity of movement when performing.   O2 afterwards: 94%, HR 91 bpm     Balance Exercises - 05/03/21 1346       Balance Exercises: Standing   Stepping Strategy Posterior;Anterior;Foam/compliant surface;UE support;10 reps;Limitations    Stepping Strategy Limitations x10 reps alternating each direction, needs cues for posture and weight shifting    Marching Limitations    Marching Limitations Forwards marching down and back x2 reps working on slowed/controlled SLS, and then adding in reciprocal arm raise down and back 3 reps. Retro marching with reciprocal arm swing down and back 3 reps                 PT Short Term Goals - 04/13/21 1531       PT SHORT TERM GOAL #1   Title Pt will be independent with HEP for improved strength, balance, transfers, and gait.  TARGET 04/14/2021     Time 4    Period Weeks    Status Achieved      PT SHORT TERM GOAL #2   Title Pt will verbalize tips to reduce festination/freezing with gait and turns.    Baseline initated and practiced; needs reminder cues    Time 4    Period Weeks    Status Partially Met      PT SHORT TERM GOAL #3   Title Pt will improve 5x sit<>stand to less than or equal to 15 sec to demonstrate improved functional strength and transfer efficiency.    Baseline 17.93 sec; 18.12 sec 04/13/2021    Time 4    Period Weeks    Status Not Met      PT SHORT TERM GOAL #4   Title Pt will improve TUG score to less than or equal to 15 sec for decreased fall risk    Baseline 19.91 sec; 12/13 sec 04/13/21, but borderline hastening with gait    Time 4    Period Weeks    Status Achieved      PT SHORT TERM GOAL #5   Title Pt will verbalize understanding of local Parkinson's disease resources.    Baseline provided on 04/10/21, pt reports he will look at them later after having follow up visits with his physicians    Time 4    Period Weeks    Status Achieved               PT Long Term Goals - 03/24/21 1437       PT LONG TERM GOAL #1   Title Pt will be independent with progression of HEP for improved strength, balance, decreased pain, transfers, and gait.  TARGET 05/12/21    Time 8    Period Weeks    Status New      PT LONG TERM GOAL #2   Title Pt will improve gait velocity to at least 2.2 ft/sec for improved gait efficiency and safety.    Baseline 1.76 ft/sec    Time 8    Period Weeks    Status New      PT LONG TERM GOAL #3   Title Pt will improve 5x sit<>stand to less than or equal to 12.6 sec to demonstrate improved functional strength and transfer efficiency.    Time 8    Period Weeks    Status New      PT LONG TERM GOAL #4   Title Pt will improve  TUG score to less than or equal to 13.5 sec for decreased fall risk.    Time 8    Period Weeks    Status New      PT LONG TERM GOAL #5   Title Pt will  ambulate at least 1000 ft indoor and outdoor surfaces, appropriate assistive device, mod independently, for imrpoved community gait.    Time 8    Period Weeks    Status New                   Plan - 05/03/21 2057     Clinical Impression Statement Performed standing PWR moves for the first time today with pt performing well with large amplitude patterns and rating an intensity level as an 8/10 throughout. Does need cues for incr foot clearance with PWR Step as pt has tendency to slide feet. Will benefit from further practice and likely add to HEP. Remainder of session focused on stepping strategies and SLS tasks for balance. Will continue to progress towards LTGs.    Personal Factors and Comorbidities Comorbidity 3+    Comorbidities PMH:  neck and back pain, PD (no Sinemet), hx of ETOH abuse, CAD, COPD, emphysema, GERD, HTN, high cholesterol, PVD    Examination-Activity Limitations Locomotion Level;Transfers;Stand    Examination-Participation Restrictions Community Activity;Other   community fitness   Stability/Clinical Decision Making Evolving/Moderate complexity    Rehab Potential Good    PT Frequency 2x / week    PT Duration --   7 weeks, plus eval visit; total 8 weeks, including eval wee   PT Treatment/Interventions ADLs/Self Care Home Management;Gait training;Stair training;Functional mobility training;Therapeutic activities;Therapeutic exercise;Balance training;Neuromuscular re-education;DME Instruction;Manual techniques;Patient/family education;Passive range of motion    PT Next Visit Plan Review standing PWR and add to HEP. Continue to work on turns, changes of direction; Step strategies, foot clearance for balance. RLE foot clearance. gait training, continue with PD education, review tips to reduce festination with gait and turns.  Connect therapy exercises with functional movement activities to help with carrover with pt. SciFit for aerobic activity/bigger movement patterns.     Consulted and Agree with Plan of Care Patient             Patient will benefit from skilled therapeutic intervention in order to improve the following deficits and impairments:  Abnormal gait, Difficulty walking, Decreased balance, Decreased mobility, Decreased strength, Pain, Postural dysfunction (stiffness noted through trunk)  Visit Diagnosis: Muscle weakness (generalized)  Other abnormalities of gait and mobility  Unsteadiness on feet  Abnormal posture     Problem List Patient Active Problem List   Diagnosis Date Noted   Depression 04/13/2021   Parkinson's disease ([place]) 02/28/2021   Weakness of both legs 02/01/2021   Dysphagia 02/01/2021   Prostate cancer ([place]) 06/09/2020   Well adult exam 06/04/2019   BPH (benign prostatic hyperplasia) 06/04/2019   Bilateral carotid bruits 06/04/2019   Meniere disease, 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 [place], [place],  [address] Phone: [phone]   Fax:  [phone]  Name: Roddy Bellamy MRN: [id number] Date of Birth: Apr 03, 1946

## 2021-05-08 ENCOUNTER — Encounter: Payer: Self-pay | Admitting: Physical Therapy

## 2021-05-08 ENCOUNTER — Ambulatory Visit: Payer: No Typology Code available for payment source | Admitting: Physical Therapy

## 2021-05-08 ENCOUNTER — Other Ambulatory Visit: Payer: Self-pay

## 2021-05-08 DIAGNOSIS — M6281 Muscle weakness (generalized): Secondary | ICD-10-CM

## 2021-05-08 DIAGNOSIS — R2689 Other abnormalities of gait and mobility: Secondary | ICD-10-CM | POA: Diagnosis not present

## 2021-05-08 DIAGNOSIS — R2681 Unsteadiness on feet: Secondary | ICD-10-CM

## 2021-05-08 NOTE — Therapy (Addendum)
[facility] [address] [place], [place], [address] Phone: [phone]   Fax:  [phone]  Physical Therapy Treatment  Patient Details  Name: Lee [name] MRN: [id number] Date of Birth: 1946/04/18 Referring Provider (PT): Landry Mellow, MD ([facility])   Encounter Date: 05/08/2021   PT End of Session - 05/08/21 1400     Visit Number 12    Number of Visits 15    Date for PT Re-Evaluation 05/12/21    Authorization Type VA approved 15 visits    Authorization - Visit Number 12    Authorization - Number of Visits 15    PT Start Time 6962    PT Stop Time 9528    PT Time Calculation (min) 40 min    Equipment Utilized During Treatment Gait belt    Activity Tolerance Patient tolerated treatment well    Behavior During Therapy WFL for tasks assessed/performed             Past Medical History:  Diagnosis Date   Acid reflux    Alcohol abuse    quit 10 y ago   Arthritis    BPH (benign prostatic hyperplasia)    CAD (coronary artery disease)    Cervical spondylosis without myelopathy 06/02/2015   COPD (chronic obstructive pulmonary disease) ([place])    Emphysema lung ([place])    GERD (gastroesophageal reflux disease)    High cholesterol    Hypertension    Parkinson's disease ([place])    PVD (peripheral vascular disease) ([place])    Tremor 06/02/2015    Past Surgical History:  Procedure Laterality Date   TONSILLECTOMY     VASECTOMY      There were no vitals filed for this visit.   Subjective Assessment - 05/08/21 1320     Subjective No falls, just some stumbling. Goes to see his doctor on Wednesday.    Pertinent History PMH:  neck and back pain, PD -dx in March 2022 per pt report.  (just started back on Sinemet), hx of ETOH abuse, CAD, COPD, emphysema, GERD, HTN, high cholesterol, PVD    How long can you walk comfortably? just walking around the house    Patient Stated Goals Pt's goals for therapy to get rid of  the pain and move better.    Pain Score 5     Pain Location --   entire RLE and hip   Pain Orientation Right    Pain Descriptors / Indicators Sore    Pain Type Chronic pain    Pain Onset More than a month ago    Aggravating Factors  doing too much, the more it gets tired.    Pain Relieving Factors sitting down and doing nothing                               [facility] Adult PT Treatment/Exercise - 05/08/21 1349       Ambulation/Gait   Ambulation/Gait Yes    Ambulation/Gait Assistance 5: Supervision    Ambulation/Gait Assistance Details With no AD - working on wide U turns and incr stride length during turn    Ambulation Distance (Feet) 230 Feet    Assistive device None    Gait Pattern Step-through pattern;Decreased step length - right;Decreased step length - left;Shuffle;Festinating;Narrow base of support;Poor foot clearance - left;Poor foot clearance - right    Ambulation Surface Level;Indoor    Gait velocity 9.97 no cane = 3.29 ft/sec, 8.95  with cane = 3.66 ft/sec      Exercises   Exercises Other Exercises    Other Exercises  Pt reporting incr buttock discomfort at piriformis on R during gait, trialed seated figure 4 stretch and supine stretch for piriformis - each for 2 x 30 seconds on R. Pt reporting that seated felt better, discussed that this is a stretch that pt can be performing at home for R hip                 Pt performs PWR! Moves in standing position:   PWR! Up for improved posture x10 reps   PWR! Rock for improved weighshifting x10 reps bilat with reaching and looking up towards hands   PWR! Twist for improved trunk rotation x5 reps bilat, cues to reset in the middle with tall posture   PWR! Step for improved step initiation 2 x 5 reps bilat, with cues for big reach and foot clearance    Cues provided for technique, relation to function, and intensity of movement when performing.    Pt reporting effort level as a 7-8/10 when performing.    Provided handout for home.Discussed switching off days that pt does his seated PWR and then his standing PWR moves.     PT Education - 05/08/21 1359     Education Details Standing PWR moves addition to HEP. Results of gait speed goal and meaning of result in regards to functional mobility/decr fall risk.    Person(s) Educated Patient    Methods Explanation    Comprehension Verbalized understanding              PT Short Term Goals - 04/13/21 1531       PT SHORT TERM GOAL #1   Title Pt will be independent with HEP for improved strength, balance, transfers, and gait.  TARGET 04/14/2021    Time 4    Period Weeks    Status Achieved      PT SHORT TERM GOAL #2   Title Pt will verbalize tips to reduce festination/freezing with gait and turns.    Baseline initated and practiced; needs reminder cues    Time 4    Period Weeks    Status Partially Met      PT SHORT TERM GOAL #3   Title Pt will improve 5x sit<>stand to less than or equal to 15 sec to demonstrate improved functional strength and transfer efficiency.    Baseline 17.93 sec; 18.12 sec 04/13/2021    Time 4    Period Weeks    Status Not Met      PT SHORT TERM GOAL #4   Title Pt will improve TUG score to less than or equal to 15 sec for decreased fall risk    Baseline 19.91 sec; 12/13 sec 04/13/21, but borderline hastening with gait    Time 4    Period Weeks    Status Achieved      PT SHORT TERM GOAL #5   Title Pt will verbalize understanding of local Parkinson's disease resources.    Baseline provided on 04/10/21, pt reports he will look at them later after having follow up visits with his physicians    Time 4    Period Weeks    Status Achieved               PT Long Term Goals - 05/08/21 1354       PT LONG TERM GOAL #1   Title Pt will be independent  with progression of HEP for improved strength, balance, decreased pain, transfers, and gait.  TARGET 05/12/21    Time 8    Period Weeks    Status New       PT LONG TERM GOAL #2   Title Pt will improve gait velocity to at least 2.2 ft/sec for improved gait efficiency and safety.    Baseline 1.76 ft/sec; 9.97 no cane = 3.29 ft/sec, 8.95 with cane = 3.66 ft/sec    Time 8    Period Weeks    Status Achieved      PT LONG TERM GOAL #3   Title Pt will improve 5x sit<>stand to less than or equal to 12.6 sec to demonstrate improved functional strength and transfer efficiency.    Time 8    Period Weeks    Status New      PT LONG TERM GOAL #4   Title Pt will improve TUG score to less than or equal to 13.5 sec for decreased fall risk.    Time 8    Period Weeks    Status New      PT LONG TERM GOAL #5   Title Pt will ambulate at least 1000 ft indoor and outdoor surfaces, appropriate assistive device, mod independently, for imrpoved community gait.    Time 8    Period Weeks    Status New                   Plan - 05/08/21 1628     Clinical Impression Statement Performed standing PWR moves again today and added to pt's HEP. Pt performing with incr intensity movement patterns throughout and rating effort level at a 7-8/10. Began to check pt's goals with pt meeting LTG #2. 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 Pt's previous gait speed was 1.76 ft/sec (incr fall risk and limited community ambulator). Pt still reporting that he does not feel like he is "getting better". Continued to discuss with pt that PD won't go away, but with PT we are helping him with strategies to help him with more optimal movement patterns. Will continue to progress towards LTGs.    Personal Factors and Comorbidities Comorbidity 3+    Comorbidities PMH:  neck and back pain, PD (no Sinemet), hx of ETOH abuse, CAD, COPD, emphysema, GERD, HTN, high cholesterol, PVD    Examination-Activity Limitations Locomotion Level;Transfers;Stand    Examination-Participation Restrictions Community Activity;Other    community fitness   Stability/Clinical Decision Making Evolving/Moderate complexity    Rehab Potential Good    PT Frequency 2x / week    PT Duration --   7 weeks, plus eval visit; total 8 weeks, including eval wee   PT Treatment/Interventions ADLs/Self Care Home Management;Gait training;Stair training;Functional mobility training;Therapeutic activities;Therapeutic exercise;Balance training;Neuromuscular re-education;DME Instruction;Manual techniques;Patient/family education;Passive range of motion    PT Next Visit Plan check LTGs, and recert for one more week. Review standing PWR as needed. Continue to work on turns, changes of direction; Step strategies,  RLE foot clearance. Review tips to reduce festination with gait and turns . SciFit for aerobic activity/bigger movement patterns.    Consulted and Agree with Plan of Care Patient             Patient will benefit from skilled therapeutic intervention in order to improve the following deficits and impairments:  Abnormal gait, Difficulty walking, Decreased balance, Decreased mobility, Decreased strength, Pain, Postural dysfunction (  stiffness noted through trunk)  Visit Diagnosis: Other abnormalities of gait and mobility  Unsteadiness on feet  Muscle weakness (generalized)     Problem List Patient Active Problem List   Diagnosis Date Noted   Depression 04/13/2021   Parkinson's disease ([place]) 02/28/2021   Weakness of both legs 02/01/2021   Dysphagia 02/01/2021   Prostate cancer ([place]) 06/09/2020   Well adult exam 06/04/2019   BPH (benign prostatic hyperplasia) 06/04/2019   Bilateral carotid bruits 06/04/2019   Meniere disease, 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   Emphysema of lung ([place]) 06/04/2019   Spondylolisthesis at L4-L5 level 08/27/2017   Spinal stenosis in cervical region 08/27/2017   Tremor 06/02/2015   Cervical spondylosis without myelopathy 06/02/2015   Alcoholism in remission ([place]) 06/09/2014   HIP PAIN 11/27/2007   Essential  hypertension 07/25/2007   Atrial fibrillation ([place]) 07/25/2007   GERD 07/25/2007   ERECTILE DYSFUNCTION 07/25/2007   LOW BACK PAIN 07/25/2007    Arliss Journey, PT, DPT  05/08/2021, 4:32 PM  [facility] [address] [place], [place], [address] Phone: [phone]   Fax:  [phone]  Name: Lee [name] MRN: [id number] Date of Birth: February 27, 1946

## 2021-05-10 ENCOUNTER — Ambulatory Visit: Payer: No Typology Code available for payment source | Admitting: Physical Therapy

## 2021-05-10 ENCOUNTER — Other Ambulatory Visit: Payer: Self-pay

## 2021-05-10 ENCOUNTER — Ambulatory Visit: Payer: No Typology Code available for payment source

## 2021-05-10 DIAGNOSIS — M6281 Muscle weakness (generalized): Secondary | ICD-10-CM

## 2021-05-10 DIAGNOSIS — G20 Parkinson's disease: Secondary | ICD-10-CM

## 2021-05-10 DIAGNOSIS — N401 Enlarged prostate with lower urinary tract symptoms: Secondary | ICD-10-CM

## 2021-05-10 DIAGNOSIS — J439 Emphysema, unspecified: Secondary | ICD-10-CM

## 2021-05-10 DIAGNOSIS — R2681 Unsteadiness on feet: Secondary | ICD-10-CM

## 2021-05-10 DIAGNOSIS — R2689 Other abnormalities of gait and mobility: Secondary | ICD-10-CM

## 2021-05-10 NOTE — Patient Instructions (Signed)
Lee [name],  It was great to talk to you today!  Please call me with any questions or concerns.  Visit Information   PATIENT GOALS:   Goals Addressed             This Visit's Progress    Manage My Medicine       Timeframe:  Long-Range Goal Priority:  High Start Date:   05/10/2021                          Expected End Date:  11/08/2021                     Follow Up Date 08/10/2021   - call if I am sick and can't take my medicine - keep a list of all the medicines I take; vitamins and herbals too - learn to read medicine labels    Why is this important?   These steps will help you keep on track with your medicines        Consent to CCM Services: Lee [name] was given information about Chronic Care Management services including:  CCM service includes personalized support from designated clinical staff supervised by his physician, including individualized plan of care and coordination with other care providers 24/7 contact phone numbers for assistance for urgent and routine care needs. Service will only be billed when office clinical staff spend 20 minutes or more in a month to coordinate care. Only one practitioner may furnish and bill the service in a calendar month. The patient may stop CCM services at any time (effective at the end of the month) by phone call to the office staff. The patient will be responsible for cost sharing (co-pay) of up to 20% of the service fee (after annual deductible is met).  Patient agreed to services and verbal consent obtained.   Patient verbalizes understanding of instructions provided today and agrees to view in [place].   Telephone follow up appointment with care management team member scheduled for: 2 months The patient has been provided with contact information for the care management team and has been advised to call with any health related questions or concerns.   [name], PharmD Clinical Pharmacist, [place]    CLINICAL CARE PLAN: Patient Care Plan: CCM Care Plan     Problem Identified: BPH, Parkinson's Disease, Emphysema   Priority: High  Onset Date: 05/10/2021     Long-Range Goal: Disease Management   Start Date: 05/10/2021  Expected End Date: 11/08/2021  This Visit's Progress: On track  Priority: High  Note:   Current Barriers:  Unable to independently monitor therapeutic efficacy  Pharmacist Clinical Goal(s):  Patient will achieve adherence to monitoring guidelines and medication adherence to achieve therapeutic efficacy through collaboration with PharmD and provider.   Interventions: 1:1 collaboration with [name], [name] Lacks, MD regarding development and update of comprehensive plan of care as evidenced by provider attestation and co-signature Inter-disciplinary care team collaboration (see longitudinal plan of care) Comprehensive medication review performed; medication list updated in electronic medical record  Parkinson's Disease  (Goal: prevention of disease progression/ management of symptoms) -Controlled -Current treatment  Rasagiline 0.36m - 1 tablet daily  Carbidopa-Levodopa 25-1051m- 1.5 tablets 3 times daily  -Medications previously tried: gabapentin  -Recommended to continue current medication  Emphysema(Goal: control symptoms and prevent exacerbations) -Controlled -Current treatment  Dulera 100-51m54mact - 2 puffs twice daily  Spiriva Respimat 2.51mc551mct - 2 puffs daily  in AM Albuterol HFA 129mg/act - 2 puffs every 6 hours as needed  -Medications previously tried: n/a  -Exacerbations requiring treatment in last 6 months: 0 -Patient reports consistent use of maintenance inhaler -Frequency of rescue inhaler use: once a day - at most twice daily  -Counseled on Benefits of consistent maintenance inhaler use When to use rescue inhaler Differences between maintenance and rescue inhalers -Recommended to continue current medication   BPH (Goal:  prevention of disease progression/ management of urinary symptoms) -[facility]  Lab Results  Component Value Date   PSA 5.09 (H) 02/01/2021  -Current treatment  Oxybutynin XL 148m- 1 tablet daily  Tamsulosin 0.40m49m 1 capsule daily  Finasteride 5mg62m1 tablet daily -Medications previously tried: n/a  -Recommended to continue current medication  Health Maintenance -Vaccine gaps: COVID booster -Current therapy:  Diclofenac 1% gel - applied 4 times daily  Fluticasone 50mc65mt - 1 spray into each nostril twice daily  Vitamin C 500mg 40mtablet daily  Multivitamin - 1 tablet daily  Pantoprazole 40mg -23mablet daily  Vitamin D2 2000units - 1 tablet daily  Folic Acid 1mg - 177mblet daily  Ginger - Mixes as a powder for back pain (ginger, cinnamon, black pepper, turmeric) Turmeric+ Black Pepper - Mixes as a powder for back pain (ginger, cinnamon, black pepper, turmeric) Cinnamon - Mixes as a powder for back pain (ginger, cinnamon, black pepper, turmeric) Ferrous Sulfate 325mg - 138mlet daily  -Educated on Herbal supplement research is limited and benefits usually cannot be proven Cost vs benefit of each product must be carefully weighed by individual consumer Supplements may interfere with prescription drugs -Patient is satisfied with current therapy and denies issues -Recommended to continue current medication  Patient Goals/Self-Care Activities Patient will:  - take medications as prescribed  Follow Up Plan: Telephone follow up appointment with care management team member scheduled for: 2 months The patient has been provided with contact information for the care management team and has been advised to call with any health related questions or concerns.

## 2021-05-10 NOTE — Therapy (Addendum)
[facility] [address] [place], [place], [address] Phone: [phone]   Fax:  [phone]  Physical Therapy Treatment/Re-Cert  Patient Details  Name: Lee [name] MRN: [id number] Date of Birth: August 05, 1945 Referring Provider (PT): Landry Mellow, MD ([facility])   Encounter Date: 05/10/2021    05/10/21 1321  PT Visits / Re-Eval  Visit Number 13  Number of Visits 15  Date for PT Re-Evaluation 06/14/21  Authorization  Authorization Type [facility] approved 15 visits  Authorization - Visit Number 27  Authorization - Number of Visits 15  PT Time Calculation  PT Start Time 1320  PT Stop Time 1400  PT Time Calculation (min) 40 min  PT - End of Session  Equipment Utilized During Treatment Gait belt  Activity Tolerance Patient tolerated treatment well  Behavior During Therapy [facility] for tasks assessed/performed     Past Medical History:  Diagnosis Date   Acid reflux    Alcohol abuse    quit 10 y ago   Arthritis    BPH (benign prostatic hyperplasia)    CAD (coronary artery disease)    Cervical spondylosis without myelopathy 06/02/2015   COPD (chronic obstructive pulmonary disease) ([place])    Emphysema lung ([facility])    GERD (gastroesophageal reflux disease)    High cholesterol    Hypertension    Parkinson's disease ([place])    PVD (peripheral vascular disease) ([place])    Tremor 06/02/2015    Past Surgical History:  Procedure Laterality Date   TONSILLECTOMY     VASECTOMY      There were no vitals filed for this visit.   Subjective Assessment - 05/10/21 1322     Subjective Went to a funeral yesterday - had to sit and cross his legs for a long time and now RLE is feeling painful. Saw the [facility] yesterday - reports that they increased his Sinemet, taking 1 and half pills 3 times a day (vs. 1 pill 3 times a day). Haven't had a chance to try the new exercises.    Pertinent History PMH:  neck and back pain, PD -dx in March 2022 per  pt report.  (just started back on Sinemet), hx of ETOH abuse, CAD, COPD, emphysema, GERD, HTN, high cholesterol, PVD    How long can you walk comfortably? just walking around the house    Patient Stated Goals Pt's goals for therapy to get rid of the pain and move better.    Currently in Pain? Yes    Pain Score 3     Pain Location --   entire RLE   Pain Orientation Right    Pain Descriptors / Indicators Sore    Pain Type Chronic pain    Pain Onset More than a month ago                [facility] PT Assessment - 05/10/21 1334       Assessment   Medical Diagnosis Parkinson's, gait instability, low back pain    Referring Provider (PT) Landry Mellow, MD ([facility])    Onset Date/Surgical Date 03/20/21      Prior Function   Level of Independence Independent      High Level Balance   High Level Balance Comments Push and release test: forward direction = 1 step, posterior direction = 1 step      Timed Up and Go Test   Normal TUG (seconds) 7.65    Manual TUG (seconds) 7.85   shuffling with turns   Cognitive  TUG (seconds) 7.78   shuffling with turns, counting backwards by 1                          [facility] Adult PT Treatment/Exercise - 05/10/21 1334       Transfers   Transfers Sit to Stand;Stand to Sit    Sit to Stand 6: Modified independent (Device/Increase time);Without upper extremity assist;From chair/3-in-1    Five time sit to stand comments  12.6 seconds    Stand to Sit 6: Modified independent (Device/Increase time);Without upper extremity assist;To chair/3-in-1      Ambulation/Gait   Ambulation/Gait Yes    Ambulation/Gait Assistance 6: Modified independent (Device/Increase time)    Ambulation/Gait Assistance Details With [facility] over indoor/outdoor surfaces    Ambulation Distance (Feet) 1000 Feet    Assistive device None    Gait Pattern Step-through pattern;Decreased step length - right;Decreased step length - left;Shuffle;Festinating;Narrow base of support;Poor  foot clearance - left;Poor foot clearance - right    Ambulation Surface Level;Indoor;Unlevel;Outdoor;Paved      Therapeutic Activites    Therapeutic Activities Other Therapeutic Activities    Other Therapeutic Activities Discussed progress towards goals based on outcome measures and how it relates to function/pt's fall risk. Despite pt meeting his LTGs, he still feels like he has not improved since coming to therapy. Discussed that next week are pt's last 2 authorized visits from the [place], and talked about potential D/C at end of authorized visits with importance of pt continuing to perform PD specific HEP and how exercise is medicine. Also discussed will follow up with pt in 6 months at this clinic with free PD screen, with pt in agreement with plan.                     PT Education - 05/10/21 1356     Education Details See therapeutic activity.    Person(s) Educated Patient    Methods Explanation    Comprehension Verbalized understanding              PT Short Term Goals - 04/13/21 1531       PT SHORT TERM GOAL #1   Title Pt will be independent with HEP for improved strength, balance, transfers, and gait.  TARGET 04/14/2021    Time 4    Period Weeks    Status Achieved      PT SHORT TERM GOAL #2   Title Pt will verbalize tips to reduce festination/freezing with gait and turns.    Baseline initated and practiced; needs reminder cues    Time 4    Period Weeks    Status Partially Met      PT SHORT TERM GOAL #3   Title Pt will improve 5x sit<>stand to less than or equal to 15 sec to demonstrate improved functional strength and transfer efficiency.    Baseline 17.93 sec; 18.12 sec 04/13/2021    Time 4    Period Weeks    Status Not Met      PT SHORT TERM GOAL #4   Title Pt will improve TUG score to less than or equal to 15 sec for decreased fall risk    Baseline 19.91 sec; 12/13 sec 04/13/21, but borderline hastening with gait    Time 4    Period Weeks    Status  Achieved      PT SHORT TERM GOAL #5   Title Pt will verbalize understanding of local Parkinson's disease  resources.    Baseline provided on 04/10/21, pt reports he will look at them later after having follow up visits with his physicians    Time 4    Period Weeks    Status Achieved               PT Long Term Goals - 05/10/21 1336       PT LONG TERM GOAL #1   Title Pt will be independent with progression of HEP for improved strength, balance, decreased pain, transfers, and gait.  TARGET 05/12/21    Time 8    Period Weeks    Status New      PT LONG TERM GOAL #2   Title Pt will improve gait velocity to at least 2.2 ft/sec for improved gait efficiency and safety.    Baseline 1.76 ft/sec; 9.97 no cane = 3.29 ft/sec, 8.95 with cane = 3.66 ft/sec    Time 8    Period Weeks    Status Achieved      PT LONG TERM GOAL #3   Title Pt will improve 5x sit<>stand to less than or equal to 12.6 sec to demonstrate improved functional strength and transfer efficiency.    Baseline 12.6 seconds on 10/212/22    Time 8    Period Weeks    Status Achieved      PT LONG TERM GOAL #4   Title Pt will improve TUG score to less than or equal to 13.5 sec for decreased fall risk.    Baseline 7.65    Time 8    Period Weeks    Status Achieved      PT LONG TERM GOAL #5   Title Pt will ambulate at least 1000 ft indoor and outdoor surfaces, appropriate assistive device, mod independently, for imrpoved community gait.    Baseline met with [facility]PC    Time 8    Period Weeks    Status Achieved            Revised/ongoing LTGs:    PT Long Term Goals - 05/15/21 2024       PT LONG TERM GOAL #1   Title Pt will be independent with progression of HEP for improved strength, balance, decreased pain, transfers, and gait.  TARGET 06/12/21    Time 4    Period Weeks    Status On-going    Target Date 06/12/21                  05/10/21 1413  Plan  Clinical Impression Statement Assessed remainder of  pt's LTGs today with pt meeting LTGs #3-5. Did not have time to assess HEP goal today due to time constraints. Pt met LTG #2 at previous session in regards to  gait speed. 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 Pt improved TUG score to 7.65 seconds (previously 12.13 seconds). Pt improved cog TUG and manual TUG scores to decr fall risk, however pt with shuffling when turning with dual tasking. Discussed results of LTGs/how they relate to function with pt and pt reporting that he has not felt like he has made progress in therapy. Discussed will re-cert for an additional week to continue to work on [employer] and to finalize HEP. Pt in agreement with plan and will schedule for return screens in 6 months. LTGs revised/ongoing as appropriate  Personal Factors and Comorbidities Comorbidity 3+  Comorbidities PMH:  neck and back pain, PD (no Sinemet), hx of ETOH abuse, CAD, COPD, emphysema, GERD, HTN, high cholesterol, PVD  Examination-Activity Limitations Locomotion Level;Transfers;Stand  Examination-Participation Restrictions Community Activity;Other (community fitness)  Pt will benefit from skilled therapeutic intervention in order to improve on the following deficits Abnormal gait;Difficulty walking;Decreased balance;Decreased mobility;Decreased strength;Pain;Postural dysfunction (stiffness noted through trunk)  Stability/Clinical Decision Making Evolving/Moderate complexity  Rehab Potential Good  PT Frequency 2x / week  PT Duration 4 weeks  PT Treatment/Interventions ADLs/Self Care Home Management;Gait training;Stair training;Functional mobility training;Therapeutic activities;Therapeutic exercise;Balance training;Neuromuscular re-education;DME Instruction;Manual techniques;Patient/family education;Passive range of motion  PT Next Visit Plan review HEP. working on gait with turns. PD optimal  fitness program handout. plan for PD screens in 6 months.  Consulted and Agree with Plan of Care Patient      Patient will benefit from skilled therapeutic intervention in order to improve the following deficits and impairments:     Visit Diagnosis: Unsteadiness on feet  Other abnormalities of gait and mobility  Muscle weakness (generalized)     Problem List Patient Active Problem List   Diagnosis Date Noted   Depression 04/13/2021   Parkinson's disease ([place]) 02/28/2021   Weakness of both legs 02/01/2021   Dysphagia 02/01/2021   Prostate cancer ([place]) 06/09/2020   Well adult exam 06/04/2019   BPH (benign prostatic hyperplasia) 06/04/2019   Bilateral carotid bruits 06/04/2019   Meniere disease, 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   Emphysema of lung ([place]) 06/04/2019   Spondylolisthesis at L4-L5 level 08/27/2017   Spinal stenosis in cervical region 08/27/2017   Tremor 06/02/2015   Cervical spondylosis without myelopathy 06/02/2015   Alcoholism in remission ([place]) 06/09/2014   HIP PAIN 11/27/2007   Essential hypertension 07/25/2007   Atrial fibrillation ([place]) 07/25/2007   GERD 07/25/2007   ERECTILE DYSFUNCTION 07/25/2007   LOW BACK PAIN 07/25/2007    Arliss Journey, PT, DPT 05/10/2021, 2:08 PM  [facility] [address] [place], [place], [address] Phone: [phone]   Fax:  [phone]  Name: Lee [name] MRN: [id number] Date of Birth: 1945-12-12

## 2021-05-10 NOTE — Progress Notes (Addendum)
Chronic Care Management Pharmacy Note  05/10/2021 Name:  Lee [name] MRN:  [id number] DOB:  1946/07/24  Summary: -Patient reports that he has been following closely with [facility] providers, notes that he has stopped gabapentin, increased sinemet up to 1.5 tablets 3 times daily since stopping gabapentin -Patient reports that he has been having increased pain in his right leg recently, states it may be from the position he had been sitting in the previous day -Patient using a combination of cinnamon, turmeric, black pepper, and ginger to help keep back and shoulder pain under control -Noted that patient has diagnosis of Afib on his problem list, patient reports that he does not currently follow with a cardiology, per last EKG (2018) - was in SR, noted atrial premature complexes, and prolonged PR interval   Recommendations/Changes made from today's visit: -Recommending no medication changes at this time, would recommend for patient to have an updated EKG, patient's next PCP appointment rescheduled for an earlier date to address increased right leg pain and to update EKG - patient reports to no current issues/ concerns at this time, aware to reach out should he have any cardiac symptoms/ proceed to ER   Subjective: Lee [name] is an 75 y.o. year old male who is a primary patient of [name], [name] Lacks, MD.  The CCM team was consulted for assistance with disease management and care coordination needs.    Engaged with patient face to face for initial visit in response to provider referral for pharmacy case management and/or care coordination services.   Consent to Services:  The patient was given the following information about Chronic Care Management services today, agreed to services, and gave verbal consent: 1. CCM service includes personalized support from designated clinical staff supervised by the primary care provider, including individualized plan of care and coordination with other  care providers 2. 24/7 contact phone numbers for assistance for urgent and routine care needs. 3. Service will only be billed when office clinical staff spend 20 minutes or more in a month to coordinate care. 4. Only one practitioner may furnish and bill the service in a calendar month. 5.The patient may stop CCM services at any time (effective at the end of the month) by phone call to the office staff. 6. The patient will be responsible for cost sharing (co-pay) of up to 20% of the service fee (after annual deductible is met). Patient agreed to services and consent obtained.  Patient Care Team: [name], [name] Lacks, MD as PCP - General (Internal Medicine) Armour, Azalia Bilis, MD as Referring Physician (Internal Medicine) Rush Farmer, MD (Pulmonary Disease) Joelene Millin PA-C as Physician Assistant (Orthopedic Surgery) Feliberto Harts as Referring Physician (Optometry) Ansley Stanwood, Darnelle Maffucci, [facility] as Pharmacist (Pharmacist) Kathrynn Ducking, MD as Consulting Physician (Neurology)  Recent office visits:  04/13/21 Lew Dawes MD PCP- pt was seen for weakness in both legs/ depression. Patient declined SSRI initiation for depression. F/u in 3 months.   03/01/21 Aleksei [name] MD- pt was seen for weakness in both legs. To be set up for nerve conduction studies and EMG of right leg - rollator walker suggested - patient refused No labs or med changes and no follow ups are noted.   02/01/21 Tyrone Apple [name] MD- pt was seen for Parethesias. Labs were ordered and Neurology referral was placed. Provider discontinued Symbicort and no f/u was noted.     Recent consult visits:  04/26/21 Janann August PT- Pt seen for outpatient therapy due to parkinsons.  03/02/21 Penumalli, Earlean Polka, MD Neurology- pt was seen for NCS (NERVE CONDUCTION STUDY) WITH EMG (ELECTROMYOGRAPHY).   02/28/21 Kathrynn Ducking, MD Neurology- pt was seen for right leg weakness. Pthad NCS with EMG done and started on  Gabapentin 100 mg 1 capsule TID for 1 week then 2 capsules TID. Follow up in 4 months.   Hospital visits:  Medication Reconciliation was completed by comparing discharge summary, patient's EMR and Pharmacy list, and upon discussion with patient.   Admitted to the hospital on 11/07/20 due to dog bite. Discharge date was 11/08/20. Discharged from [facility]?Medications Started at [facility] Discharge:?? -started none   Medication Changes at Hospital Discharge: -Changed none   Medications Discontinued at Hospital Discharge: -Stopped none   Medications that remain the same after Hospital Discharge:??  -All other medications will remain the same.   Objective:  Lab Results  Component Value Date   CREATININE 1.02 02/01/2021   BUN 8 02/01/2021   GFR 72.89 02/01/2021   GFRNONAA >60 01/05/2017   GFRAA >60 01/05/2017   NA 140 02/01/2021   K 3.7 02/01/2021   CALCIUM 9.7 02/01/2021   CO2 29 02/01/2021   GLUCOSE 95 02/01/2021    Lab Results  Component Value Date/Time   GFR 72.89 02/01/2021 03:29 PM    Last diabetic Eye exam:  No results found for: HMDIABEYEEXA  Last diabetic Foot exam:  No results found for: HMDIABFOOTEX   Lab Results  Component Value Date   CHOL 225 (H) 02/01/2021   HDL 88.10 02/01/2021   LDLCALC 126 (H) 02/01/2021   TRIG 56.0 02/01/2021   CHOLHDL 3 02/01/2021    Hepatic Function Latest Ref Rng & Units 02/01/2021  Total Protein 6.0 - 8.3 g/dL 7.0  Albumin 3.5 - 5.2 g/dL 4.4  AST 0 - 37 U/L 18  ALT 0 - 53 U/L 14  Alk Phosphatase 39 - 117 U/L 53  Total Bilirubin 0.2 - 1.2 mg/dL 0.8    Lab Results  Component Value Date/Time   TSH 1.61 02/01/2021 03:29 PM   FREET4 0.86 02/01/2021 03:29 PM    CBC Latest Ref Rng & Units 02/01/2021 01/05/2017 09/20/2016  WBC 4.0 - 10.5 K/uL 3.4(L) 6.5 3.2(L)  Hemoglobin 13.0 - 17.0 g/dL 13.6 13.9 14.0  Hematocrit 39.0 - 52.0 % 39.8 40.5 40.4  Platelets 150.0 - 400.0 K/uL 184.0 199 172     Lab Results  Component Value Date/Time   VD25OH 57.33 02/01/2021 03:29 PM    Clinical ASCVD: No  The 10-year ASCVD risk score (Arnett DK, et al., 2019) is: 17%   Values used to calculate the score:     Age: 72 years     Sex: Male     Is Non-Hispanic African American: Yes     Diabetic: No     Tobacco smoker: Yes     Systolic Blood Pressure: 034 mmHg     Is BP treated: No     HDL Cholesterol: 88.1 mg/dL     Total Cholesterol: 225 mg/dL    Depression screen [facility] 2/9 03/23/2021 02/01/2021 09/21/2019  Decreased Interest 0 0 0  Down, Depressed, Hopeless 0 0 0  PHQ - 2 Score 0 0 0  Altered sleeping 0 0 0  Tired, decreased energy 0 0 0  Change in appetite 0 0 0  Feeling bad or failure about yourself  0 0 0  Trouble concentrating 0 0 0  Moving slowly or fidgety/restless 0  0 0  Suicidal thoughts 0 0 0  PHQ-9 Score 0 0 0  Difficult doing work/chores - - Not difficult at all     Social History   Tobacco Use  Smoking Status Former   Types: Cigarettes   Quit date: 06/09/2005   Years since quitting: 15.9  Smokeless Tobacco Never   BP Readings from Last 3 Encounters:  04/13/21 120/70  04/10/21 126/66  03/01/21 118/62   Pulse Readings from Last 3 Encounters:  04/13/21 79  04/10/21 88  03/01/21 80   Wt Readings from Last 3 Encounters:  04/13/21 154 lb 3.2 oz (69.9 kg)  03/01/21 152 lb 9.6 oz (69.2 kg)  02/28/21 152 lb (68.9 kg)   BMI Readings from Last 3 Encounters:  04/13/21 21.81 kg/m  03/01/21 21.59 kg/m  02/28/21 21.50 kg/m    Assessment/Interventions: Review of patient past medical history, allergies, medications, health status, including review of consultants reports, laboratory and other test data, was performed as part of comprehensive evaluation and provision of chronic care management services.   SDOH:  (Social Determinants of Health) assessments and interventions performed: Yes  SDOH Screenings   Alcohol Screen: Low Risk    Last Alcohol Screening  Score (AUDIT): 0  Depression (PHQ2-9): Low Risk    PHQ-2 Score: 0  Financial Resource Strain: Low Risk    Difficulty of Paying Living Expenses: Not hard at all  Food Insecurity: No Food Insecurity   Worried About Charity fundraiser in the Last Year: Never true   Ran Out of Food in the Last Year: Never true  Housing: Low Risk    Last Housing Risk Score: 0  Physical Activity: Inactive   Days of Exercise per Week: 0 days   Minutes of Exercise per Session: 0 min  Social Connections: Moderately Integrated   Frequency of Communication with Friends and Family: More than three times a week   Frequency of Social Gatherings with Friends and Family: Once a week   Attends Religious Services: More than 4 times per year   Active Member of [employer] or Organizations: Yes   Attends Music therapist: More than 4 times per year   Marital Status: Divorced  Stress: No Stress Concern Present   Feeling of Stress : Not at all  Tobacco Use: Medium Risk   Smoking Tobacco Use: Former   Smokeless Tobacco Use: Never  Transportation Needs: No Data processing manager (Medical): No   Lack of Transportation (Non-Medical): No    CCM Care Plan  Allergies  Allergen Reactions   Acyclovir Swelling   Enalapril Maleate Other (See Comments)    Reaction:  Unknown     Medications Reviewed Today     Reviewed by [name], [facility] (Pharmacist) on 05/10/21 at [place] List Status: <None>   Medication Order Taking? Sig Documenting Provider Last Dose Status Informant  albuterol (PROVENTIL HFA;VENTOLIN HFA) 108 (90 Base) MCG/ACT inhaler [id number] Yes Inhale 2 puffs into the lungs every 4 (four) hours as needed for wheezing or shortness of breath. Delora Fuel, MD Taking Active   BLACK [facility] PO [id number] Yes Take by mouth daily. Mixes as a powder for back pain (ginger, cinnamon, black pepper, turmeric) [provider] Taking Active   Capsaicin 0.1 % CREA [id number]  No APPLY THIN LAYER TO AFFECTED AREA TWICE A DAY AS NEEDED  Patient not taking: Reported on 05/10/2021   [provider] Not Taking Active   carbidopa-levodopa (SINEMET IR) 25-100  MG tablet [id number] Yes 1.5 tablets 3 times daily for 1 month, then take 2 tablets 3 times daily  Patient taking differently: Take 1.5 tablets by mouth 3 (three) times daily.   Kathrynn Ducking, MD Taking Active   CINNAMON PO [id number] Yes Mixes as a powder for back pain (ginger, cinnamon, black pepper, turmeric) [provider] Taking Active   diclofenac Sodium (VOLTAREN) 1 % GEL [id number] No Apply topically.  Patient not taking: Reported on 05/10/2021   [provider] Not Taking Active   Ergocalciferol (VITAMIN D2) 50 MCG (2000 UT) TABS [id number] Yes Take by mouth. [provider] Taking Active   FEROSUL 325 (65 Fe) MG tablet [id number] Yes Take by mouth. [provider] Taking Active   finasteride (PROSCAR) 5 MG tablet [id number] Yes Take 5 mg by mouth daily. [provider] Taking Active   fluticasone (FLONASE) 50 MCG/ACT nasal spray [id number] Yes INSTILL 1 SPRAY IN EACH NOSTRIL TWICE A DAY FOR DIZZINESS [provider] Taking Active   folic acid (FOLVITE) 1 MG tablet [id number] Yes Take 1 mg by mouth daily. [provider] Taking Active Self  Ginger, Zingiber officinalis, (GINGER PO) [id number] Yes Mixes as a powder for back pain (ginger, cinnamon, black pepper, turmeric) [provider] Taking Active   mometasone-formoterol (DULERA) 100-5 MCG/ACT AERO [id number] Yes INHALE 2 INHALATIONS BY MOUTH TWICE A DAY (RINSE MOUTH WELL WITH WATER AFTER EACH USE) FOR BREATHING, COPD, ASTHMA.  REPLACES [place]. [provider] Taking Active   Multiple Vitamin Artis Flock) TABS [id number] Yes Take by mouth. [provider] Taking Active   oxybutynin (DITROPAN-XL) 10 MG 24 hr tablet [id number] Yes Take 10 mg by mouth at  bedtime. [provider] Taking Active   pantoprazole (PROTONIX) 40 MG tablet [id number] Yes Take by mouth. [provider] Taking Active   rasagiline (AZILECT) 0.5 MG TABS tablet [id number] Yes Take by mouth. Take 1 by mouth daily [provider] Taking Active   tamsulosin (FLOMAX) 0.4 MG CAPS capsule [id number] Yes Take 0.4 mg by mouth. [provider] Taking Active   Tiotropium Bromide Monohydrate 2.5 MCG/ACT AERS [id number] Yes Inhale 2 puffs into the lungs daily. [provider] Taking Active Self  vitamin C (ASCORBIC ACID) 500 MG tablet [id number] Yes Take by mouth. [provider] Taking Active   Med List Note Corena Herter 01/05/17 6767): Pt also uses the [facility] in [place], [place].              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   HIP PAIN 11/27/2007   Essential hypertension 07/25/2007   Atrial fibrillation ([place]) 07/25/2007   GERD 07/25/2007   ERECTILE DYSFUNCTION 07/25/2007   LOW BACK PAIN 07/25/2007    Immunization History  Administered Date(s) Administered   Fluad Quad(high Dose 65+) 04/29/2020, 05/10/2020   Influenza Split 05/31/2015   Influenza Whole 07/25/2007   Influenza, High Dose Seasonal PF  03/30/2013, 04/15/2013, 04/29/2017, 04/23/2018, 04/27/2019, 05/21/2019   Influenza,inj,Quad PF,6+ Mos 05/28/2016   Influenza-Unspecified  05/31/2007, 06/18/2008, 05/16/2009, 04/11/2010, 04/29/2010, 07/27/2011, 05/15/2012, 05/12/2014   Moderna Sars-Covid-2 Vaccination 08/25/2019, 09/22/2019, 06/24/2020, 12/08/2020   Pneumococcal Conjugate-13 06/29/2014   Pneumococcal Polysaccharide-23 05/28/2013   Pneumococcal-Unspecified 05/06/2007   Rabies, IM 04/20/2020   Tdap 09/07/2011   Zoster Recombinat (Shingrix) 12/11/2019, 02/16/2020    Conditions to be addressed/monitored:  BPH and Parkinson's Disease  Care Plan : CCM Care Plan  Updates made by [name], RPH since 05/10/2021 12:00 AM     Problem: BPH, Parkinson's Disease, Emphysema   Priority: High  Onset Date: 05/10/2021     Long-Range Goal: Disease Management   Start Date: 05/10/2021  Expected End Date: 11/08/2021  This Visit's Progress: On track  Priority: High  Note:   Current Barriers:  Unable to independently monitor therapeutic efficacy  Pharmacist Clinical Goal(s):  Patient will achieve adherence to monitoring guidelines and medication adherence to achieve therapeutic efficacy through collaboration with PharmD and provider.   Interventions: 1:1 collaboration with [name], [name] Lacks, MD regarding development and update of comprehensive plan of care as evidenced by provider attestation and co-signature Inter-disciplinary care team collaboration (see longitudinal plan of care) Comprehensive medication review performed; medication list updated in electronic medical record  Parkinson's Disease  (Goal: prevention of disease progression/ management of symptoms) -Controlled -Current treatment  Rasagiline 0.19m - 1 tablet daily  Carbidopa-Levodopa 25-1057m- 1.5 tablets 3 times daily  -Medications previously tried: gabapentin  -Recommended to continue current medication  Emphysema(Goal: control symptoms and prevent exacerbations) -Controlled -Current treatment  Dulera 100-2m12mact - 2 puffs twice daily  Spiriva Respimat 2.2mc61mct - 2 puffs daily in  AM Albuterol HFA 108mc81mt - 2 puffs every 6 hours as needed  -Medications previously tried: n/a  -Exacerbations requiring treatment in last 6 months: 0 -Patient reports consistent use of maintenance inhaler -Frequency of rescue inhaler use: once a day - at most twice daily  -Counseled on Benefits of consistent maintenance inhaler use When to use rescue inhaler Differences between maintenance and rescue inhalers -Recommended to continue current medication   BPH (Goal: prevention of disease progression/ management of urinary symptoms) -Cont[facility]  Lab Results  Component Value Date   PSA 5.09 (H) 02/01/2021  -Current treatment  Oxybutynin XL 10mg 53mtablet daily  Tamsulosin 0.4mg - 54mapsule daily  Finasteride 2mg - 168mblet daily -Medications previously tried: n/a  -Recommended to continue current medication  Health Maintenance -Vaccine gaps: COVID booster -Current therapy:  Diclofenac 1% gel - applied 4 times daily  Fluticasone 50mcg/ac59m1 spray into each nostril twice daily  Vitamin C 500mg - 1 60met daily  Multivitamin - 1 tablet daily  Pantoprazole 40mg - 1 t82mt daily  Vitamin D2 2000units - 1 tablet daily  Folic Acid 1mg - 1 tab31m daily  Ginger - Mixes as a powder for back pain (ginger, cinnamon, black pepper, turmeric) Turmeric+ Black Pepper - Mixes as a powder for back pain (ginger, cinnamon, black pepper, turmeric) Cinnamon - Mixes as a powder for back pain (ginger, cinnamon, black pepper, turmeric) Ferrous Sulfate 322mg - 1 tab77mdaily  -Educated on Herbal supplement research is limited and benefits usually cannot be proven Cost vs benefit of each product must be carefully weighed by individual consumer Supplements may interfere with prescription drugs -Patient is satisfied with current therapy and denies issues -Recommended to continue current medication  Patient Goals/Self-Care Activities Patient will:  -  take medications as  prescribed  Follow Up Plan: Telephone follow up appointment with care management team member scheduled for: 2 months The patient has been provided with contact information for the care management team and has been advised to call with any health related questions or concerns.      Medication Assistance: None required.  Patient affirms current coverage meets needs.   Care Gaps: Hep C screening Colonoscopy COVID booster  Patient's preferred pharmacy is:  [place], [place] - [address] [place] [address] Phone: [phone] Fax: [phone]   Uses pill box? No - feels he is able to manage without  Pt endorses 100% compliance  Care Plan and Follow Up Patient Decision:  Patient agrees to Care Plan and Follow-up.  Plan: Telephone follow up appointment with care management team member scheduled for:  2 months, The patient has been provided with contact information for the care management team and has been advised to call with any health related questions or concerns. , and Follow up with provider re: updated EKG / right leg pain  [name], PharmD Clinical Pharmacist, [place] screening examination/treatment/procedure(s) were performed by non-physician practitioner and as supervising physician I was immediately available for consultation/collaboration.  I agree with above. Lew Dawes, MD

## 2021-05-15 ENCOUNTER — Ambulatory Visit: Payer: No Typology Code available for payment source | Admitting: Physical Therapy

## 2021-05-15 NOTE — Addendum Note (Signed)
Addended by: Arliss Journey on: 05/15/2021 08:26 PM   Modules accepted: Orders

## 2021-05-15 NOTE — Addendum Note (Signed)
Addended by: Arliss Journey on: 05/15/2021 07:48 PM   Modules accepted: Orders

## 2021-05-16 ENCOUNTER — Other Ambulatory Visit: Payer: Self-pay

## 2021-05-16 ENCOUNTER — Ambulatory Visit: Payer: No Typology Code available for payment source | Admitting: Physical Therapy

## 2021-05-16 ENCOUNTER — Encounter: Payer: Self-pay | Admitting: Physical Therapy

## 2021-05-16 DIAGNOSIS — M6281 Muscle weakness (generalized): Secondary | ICD-10-CM

## 2021-05-16 DIAGNOSIS — R2689 Other abnormalities of gait and mobility: Secondary | ICD-10-CM | POA: Diagnosis not present

## 2021-05-16 DIAGNOSIS — R2681 Unsteadiness on feet: Secondary | ICD-10-CM

## 2021-05-16 NOTE — Therapy (Signed)
[facility] [address] [place], [place], [address] Phone: [phone]   Fax:  [phone]  Physical Therapy Treatment  Patient Details  Name: Lee [name] MRN: [id number] Date of Birth: July 03, 1946 Referring Provider (PT): Landry Mellow, MD ([facility])   Encounter Date: 05/16/2021   PT End of Session - 05/16/21 1234     Visit Number 14    Number of Visits 15    Date for PT Re-Evaluation 06/14/21    Authorization Type VA approved 15 visits    Authorization - Visit Number 51    Authorization - Number of Visits 15    PT Start Time 2094    PT Stop Time 1310    PT Time Calculation (min) 39 min    Equipment Utilized During Treatment --    Activity Tolerance Patient tolerated treatment well    Behavior During Therapy [facility] for tasks assessed/performed             Past Medical History:  Diagnosis Date   Acid reflux    Alcohol abuse    quit 10 y ago   Arthritis    BPH (benign prostatic hyperplasia)    CAD (coronary artery disease)    Cervical spondylosis without myelopathy 06/02/2015   COPD (chronic obstructive pulmonary disease) ([place])    Emphysema lung (H[facility])    GERD (gastroesophageal reflux disease)    High cholesterol    Hypertension    Parkinson's disease ([place])    PVD (peripheral vascular disease) ([place])    Tremor 06/02/2015    Past Surgical History:  Procedure Laterality Date   TONSILLECTOMY     VASECTOMY      There were no vitals filed for this visit.   Subjective Assessment - 05/16/21 1233     Subjective Reports right leg is feeling better. Had to get up early this morning to drive his brother to a surgery.    Pertinent History PMH:  neck and back pain, PD -dx in March 2022 per pt report.  (just started back on Sinemet), hx of ETOH abuse, CAD, COPD, emphysema, GERD, HTN, high cholesterol, PVD    How long can you walk comfortably? just walking around the house    Patient Stated Goals Pt's  goals for therapy to get rid of the pain and move better.    Currently in Pain? No/denies    Pain Onset More than a month ago                               [facility] Adult PT Treatment/Exercise - 05/16/21 1243       Transfers   Comments TUG shuttle with cognitive challenge - naming states in the [place] x4 reps, with pt needing cues for stride length when performing performing turn      Ambulation/Gait   Ambulation/Gait Yes    Ambulation/Gait Assistance 6: Modified independent (Device/Increase time)    Ambulation/Gait Assistance Details Reviewed using lateral weight shifting initially in standing to help initiate gait    Assistive device None    Gait Pattern Step-through pattern;Decreased step length - right;Decreased step length - left;Shuffle;Festinating;Narrow base of support;Poor foot clearance - left;Poor foot clearance - right    Ambulation Surface Level;Indoor    Gait Comments Working on wide U-turns with cognitive challenge of naming foods in alphabetical order from A-Z down and back x3 reps over 40'      Therapeutic Activites  Therapeutic Activities Other Therapeutic Activities    Other Therapeutic Activities Discussed and provided handout for optimal fitness program for PD. Pt not a member of a gym and to bring in a picture of his bike that he has at home to determine if it is safe to use               Access Code: [email or web address] URL: [email or web address] Date: 05/16/2021 Prepared by: Janann August  Reviewed HEP and how each exercise relates to function:  Pt reports performing consistently at home.   Exercises Lateral Weight Shift - 1 x daily - 5 x weekly - 5 sets - 10 reps Alternating Step Backward with Support - 1 x daily - 5 x weekly - 1 sets - 10 reps Alternating Step Forward with Support - 1 x daily - 5 x weekly - 1 sets - 10 reps Step Sideways - 1 x daily - 5 x weekly - 1 sets - 10 reps Standing Quarter Turn with Counter Support -  1 x daily - 5 x weekly - 1 sets - 5 reps      PT Education - 05/16/21 1237     Education Details After D/C from PT this week will plan for return PD screens in 6 months.    Person(s) Educated Patient    Methods Explanation    Comprehension Verbalized understanding              PT Short Term Goals - 05/16/21 1427       PT SHORT TERM GOAL #1   Title ALL STGS = LTGS               PT Long Term Goals - 05/15/21 2024       PT LONG TERM GOAL #1   Title Pt will be independent with progression of HEP for improved strength, balance, decreased pain, transfers, and gait.  TARGET 06/12/21    Time 4    Period Weeks    Status On-going    Target Date 06/12/21                   Plan - 05/16/21 1429     Clinical Impression Statement Today's skilled session focused on reviewing pt's standing balance HEP and reviewed how these exercises relate to function and worked on turns and maintaining stride length/marching turns with cognitive dual tasking. Pt able to verbalize understanding of techniques to assist with turning and using lateral rocking to help initiate gait. Will review remainder of HEP at next session with plan for D/C at tomorrow's session. Pt in agreement with plan.    Personal Factors and Comorbidities Comorbidity 3+    Comorbidities PMH:  neck and back pain, PD (no Sinemet), hx of ETOH abuse, CAD, COPD, emphysema, GERD, HTN, high cholesterol, PVD    Examination-Activity Limitations Locomotion Level;Transfers;Stand    Examination-Participation Restrictions Community Activity;Other   community fitness   Stability/Clinical Decision Making Evolving/Moderate complexity    Rehab Potential Good    PT Frequency 2x / week    PT Duration 4 weeks    PT Treatment/Interventions ADLs/Self Care Home Management;Gait training;Stair training;Functional mobility training;Therapeutic activities;Therapeutic exercise;Balance training;Neuromuscular re-education;DME Instruction;Manual  techniques;Patient/family education;Passive range of motion    PT Next Visit Plan PD screens. check remainder of PWR moves. D/C    Consulted and Agree with Plan of Care Patient             Patient will benefit from skilled therapeutic intervention in order to  improve the following deficits and impairments:  Abnormal gait, Difficulty walking, Decreased balance, Decreased mobility, Decreased strength, Pain, Postural dysfunction (stiffness noted through trunk)  Visit Diagnosis: Other abnormalities of gait and mobility  Unsteadiness on feet  Muscle weakness (generalized)     Problem List Patient Active Problem List   Diagnosis Date Noted   Depression 04/13/2021   Parkinson's disease ([place]) 02/28/2021   Weakness of both legs 02/01/2021   Dysphagia 02/01/2021   Prostate cancer ([place]) 06/09/2020   Well adult exam 06/04/2019   BPH (benign prostatic hyperplasia) 06/04/2019   Bilateral carotid bruits 06/04/2019   Meniere disease, 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   Emphysema of lung ([place]) 06/04/2019   Spondylolisthesis at L4-L5 level 08/27/2017   Spinal stenosis in cervical region 08/27/2017   Tremor 06/02/2015   Cervical spondylosis without myelopathy 06/02/2015   Alcoholism in remission ([place]) 06/09/2014   HIP PAIN 11/27/2007   Essential hypertension 07/25/2007   Atrial fibrillation ([place]) 07/25/2007   GERD 07/25/2007   ERECTILE DYSFUNCTION 07/25/2007   LOW BACK PAIN 07/25/2007    Arliss Journey, PT, DPT  05/16/2021, 2:31 PM  [facility] [address] [place], [place], [address] Phone: [phone]   Fax:  [phone]  Name: Lee [name] MRN: [id number] Date of Birth: [birth year]

## 2021-05-16 NOTE — Patient Instructions (Signed)
Optimal Fitness Program after Therapy for People with Parkinson's Disease  1)  Therapy Home Exercise Program  -Do these Exercises DAILY as instructed by your therapist  -Big, deliberate effort with exercises  -These exercises are important to perform consistently, even when therapist has  finished, because these therapy exercises often address your specific  Parkinson's difficulties   2)  Walking  -  Work up to walking 3-5 times per week, 20-30 minutes per day  -This can be done at home, driveway, quiet street or an indoor track  -Focus should be on your Best posture, arm swing, step length for your best  walking pattern  3)  Aerobic Exercise  -Work up to 3-5 times per week, 30 minutes per day  -This can be stationary bike, seated stepper machine, elliptical machine  -Work up to 7-8/10 intensity during the exercise, at minimal to moderate     Resistance

## 2021-05-17 ENCOUNTER — Ambulatory Visit: Payer: No Typology Code available for payment source | Admitting: Physical Therapy

## 2021-05-17 DIAGNOSIS — R2689 Other abnormalities of gait and mobility: Secondary | ICD-10-CM

## 2021-05-17 DIAGNOSIS — M6281 Muscle weakness (generalized): Secondary | ICD-10-CM

## 2021-05-17 DIAGNOSIS — R2681 Unsteadiness on feet: Secondary | ICD-10-CM

## 2021-05-17 NOTE — Therapy (Signed)
[facility] [address] [place], [place], [address] Phone: [phone]   Fax:  [phone]  Physical Therapy Treatment/Discharge Summary  Patient Details  Name: Lee [name] MRN: [id number] Date of Birth: September 10, 1945 Referring Provider (PT): Landry Mellow, MD ([facility])   Encounter Date: 05/17/2021   PT End of Session - 05/17/21 1021     Visit Number 15    Number of Visits 15    Date for PT Re-Evaluation 06/14/21    Authorization Type VA approved 15 visits    Authorization - Visit Number 109    Authorization - Number of Visits 15    PT Start Time 1020    Activity Tolerance Patient tolerated treatment well    Behavior During Therapy [facility] for tasks assessed/performed             Past Medical History:  Diagnosis Date   Acid reflux    Alcohol abuse    quit 10 y ago   Arthritis    BPH (benign prostatic hyperplasia)    CAD (coronary artery disease)    Cervical spondylosis without myelopathy 06/02/2015   COPD (chronic obstructive pulmonary disease) ([place])    Emphysema lung ([place])    GERD (gastroesophageal reflux disease)    High cholesterol    Hypertension    Parkinson's disease ([place])    PVD (peripheral vascular disease) ([place])    Tremor 06/02/2015    Past Surgical History:  Procedure Laterality Date   TONSILLECTOMY     VASECTOMY      There were no vitals filed for this visit.   Subjective Assessment - 05/17/21 1021     Subjective No changes, no falls.    Pertinent History PMH:  neck and back pain, PD -dx in March 2022 per pt report.  (just started back on Sinemet), hx of ETOH abuse, CAD, COPD, emphysema, GERD, HTN, high cholesterol, PVD    How long can you walk comfortably? just walking around the house    Patient Stated Goals Pt's goals for therapy to get rid of the pain and move better.    Currently in Pain? No/denies    Pain Onset More than a month ago                                Reviewed PWR moves from HEP:    Pt performs PWR! Moves in sitting position:    PWR! Up for improved posture x10 reps each side  PWR! Rock for improved weighshifting x5 reps each side with kicking leg out  PWR! Twist for improved trunk rotation x5 reps each side  PWR! Step for improved step initiation x10 reps, single step out and in     Pt performs PWR! Moves in standing position:    PWR! Up for improved posture x10 reps   PWR! Rock for improved weighshifting x10 reps  PWR! Twist for improved trunk rotation x5 reps each side   PWR! Step for improved step initiation x5 reps each side    Reviewed intensity level when performing and continued importance of continuing HEP/exercise program after D/C.    PT Education - 05/17/21 1044     Education Details Provided updated handout on local PD resources    Person(s) Educated Patient    Methods Explanation;Handout    Comprehension Verbalized understanding              PT Short Term Goals - 05/16/21  1427       PT SHORT TERM GOAL #1   Title ALL STGS = LTGS               PT Long Term Goals - 05/17/21 1054       PT LONG TERM GOAL #1   Title Pt will be independent with progression of HEP for improved strength, balance, decreased pain, transfers, and gait.  TARGET 06/12/21    Baseline met on 05/17/21    Time 4    Period Weeks    Status Achieved              PHYSICAL THERAPY DISCHARGE SUMMARY  Visits from Start of Care: 15  Current functional level related to goals / functional outcomes: TUG: 7.65 seconds 5x sit <> stand: 12.6 seconds  Gait speed: no AD 3.29 ft/sec, 3.66 ft/sec with cane    Remaining deficits: Gait abnormalities, impaired balance.   Education / Equipment: HEP/handout on local PD resources    Patient agrees to discharge. Patient goals were met. Patient is being discharged due to meeting the stated rehab goals.      Plan - 05/17/21 1054      Clinical Impression Statement Provided updated handout for local PD resources. Reviewed pt's seated and standing PWR moves which pt reports performing consistently at home as well as balance HEP. Pt met LTG #1. Will D/C from PT at this time with return PD screens to be scheduled in 6 months, pt in agreement with plan.    Personal Factors and Comorbidities Comorbidity 3+    Comorbidities PMH:  neck and back pain, PD (no Sinemet), hx of ETOH abuse, CAD, COPD, emphysema, GERD, HTN, high cholesterol, PVD    Examination-Activity Limitations Locomotion Level;Transfers;Stand    Examination-Participation Restrictions Community Activity;Other   community fitness   Stability/Clinical Decision Making Evolving/Moderate complexity    Rehab Potential Good    PT Frequency 2x / week    PT Duration 4 weeks    PT Treatment/Interventions ADLs/Self Care Home Management;Gait training;Stair training;Functional mobility training;Therapeutic activities;Therapeutic exercise;Balance training;Neuromuscular re-education;DME Instruction;Manual techniques;Patient/family education;Passive range of motion    PT Next Visit Plan D/C    Consulted and Agree with Plan of Care Patient             Patient will benefit from skilled therapeutic intervention in order to improve the following deficits and impairments:  Abnormal gait, Difficulty walking, Decreased balance, Decreased mobility, Decreased strength, Pain, Postural dysfunction (stiffness noted through trunk)  Visit Diagnosis: Unsteadiness on feet  Other abnormalities of gait and mobility  Muscle weakness (generalized)     Problem List Patient Active Problem List   Diagnosis Date Noted   Depression 04/13/2021   Parkinson's disease ([place]) 02/28/2021   Weakness of both legs 02/01/2021   Dysphagia 02/01/2021   Prostate cancer ([place]) 06/09/2020   Well adult exam 06/04/2019   BPH (benign prostatic hyperplasia) 06/04/2019   Bilateral carotid bruits 06/04/2019    Meniere disease, 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   Emphysema of lung ([place]) 06/04/2019   Spondylolisthesis at L4-L5 level 08/27/2017   Spinal stenosis in cervical region 08/27/2017   Tremor 06/02/2015   Cervical spondylosis without myelopathy 06/02/2015   Alcoholism in remission ([place]) 06/09/2014   HIP PAIN 11/27/2007   Essential hypertension 07/25/2007   Atrial fibrillation ([place]) 07/25/2007   GERD 07/25/2007   ERECTILE DYSFUNCTION 07/25/2007   LOW BACK PAIN 07/25/2007    Arliss Journey, PT, DPT  05/17/2021, 10:56 AM  [facility]  [facility] [address] [place], [place], [address] Phone: [phone]   Fax:  [phone]  Name: Lee [name] MRN: [id number] Date of Birth: 11/08/1945

## 2021-05-25 ENCOUNTER — Encounter: Payer: Self-pay | Admitting: Internal Medicine

## 2021-05-25 ENCOUNTER — Other Ambulatory Visit: Payer: Self-pay

## 2021-05-25 ENCOUNTER — Ambulatory Visit (INDEPENDENT_AMBULATORY_CARE_PROVIDER_SITE_OTHER): Payer: Medicare Other | Admitting: Internal Medicine

## 2021-05-25 DIAGNOSIS — M5440 Lumbago with sciatica, unspecified side: Secondary | ICD-10-CM | POA: Diagnosis not present

## 2021-05-25 DIAGNOSIS — G20 Parkinson's disease: Secondary | ICD-10-CM

## 2021-05-25 DIAGNOSIS — G20A1 Parkinson's disease without dyskinesia, without mention of fluctuations: Secondary | ICD-10-CM

## 2021-05-25 DIAGNOSIS — G8929 Other chronic pain: Secondary | ICD-10-CM

## 2021-05-25 DIAGNOSIS — I480 Paroxysmal atrial fibrillation: Secondary | ICD-10-CM

## 2021-05-25 DIAGNOSIS — R29898 Other symptoms and signs involving the musculoskeletal system: Secondary | ICD-10-CM | POA: Diagnosis not present

## 2021-05-25 DIAGNOSIS — I10 Essential (primary) hypertension: Secondary | ICD-10-CM | POA: Diagnosis not present

## 2021-05-25 NOTE — Assessment & Plan Note (Signed)
Using a cane.  Not taking Sinemet

## 2021-05-25 NOTE — Patient Instructions (Signed)
Look at [place] or [employer]

## 2021-05-25 NOTE — Assessment & Plan Note (Signed)
No relapse. Not on ASA

## 2021-05-25 NOTE — Assessment & Plan Note (Addendum)
S/p Neuro eval by Dr [name] on 03/02/21.  Mr Lee [name] is a vague historian. Not taking Sinemet Look at [place] or [employer]

## 2021-05-25 NOTE — Assessment & Plan Note (Signed)
On NAS diet  

## 2021-05-25 NOTE — Progress Notes (Signed)
Subjective:  Patient ID: Lee [name], male    DOB: [birth year]  Age: 75 y.o. MRN: [id number]  CC: Follow-up (3 months f/u)   HPI Lee [name] presents for gait disorder, Parkinson disease, PAF f/u. Lee [name] is a vague historian. Not taking Sinemet  Outpatient Medications Prior to Visit  Medication Sig Dispense Refill   albuterol (PROVENTIL HFA;VENTOLIN HFA) 108 (90 Base) MCG/ACT inhaler Inhale 2 puffs into the lungs every 4 (four) hours as needed for wheezing or shortness of breath. 1 Inhaler 0   BLACK PEPPER-TURMERIC PO Take by mouth daily. Mixes as a powder for back pain (ginger, cinnamon, black pepper, turmeric)     carbidopa-levodopa (SINEMET IR) 25-100 MG tablet 1.5 tablets 3 times daily for 1 month, then take 2 tablets 3 times daily (Patient taking differently: Take 1.5 tablets by mouth 3 (three) times daily.) 540 tablet 1   CINNAMON PO Mixes as a powder for back pain (ginger, cinnamon, black pepper, turmeric)     Ergocalciferol (VITAMIN D2) 50 MCG (2000 UT) TABS Take by mouth.     FEROSUL 325 (65 Fe) MG tablet Take by mouth.     finasteride (PROSCAR) 5 MG tablet Take 5 mg by mouth daily.     fluticasone (FLONASE) 50 MCG/ACT nasal spray INSTILL 1 SPRAY IN EACH NOSTRIL TWICE A DAY FOR DIZZINESS     folic acid (FOLVITE) 1 MG tablet Take 1 mg by mouth daily.     Ginger, Zingiber officinalis, (GINGER PO) Mixes as a powder for back pain (ginger, cinnamon, black pepper, turmeric)     mometasone-formoterol (DULERA) 100-5 MCG/ACT AERO INHALE 2 INHALATIONS BY MOUTH TWICE A DAY (RINSE MOUTH WELL WITH WATER AFTER EACH USE) FOR BREATHING, COPD, ASTHMA.  REPLACES [place].     Multiple Vitamin (QUINTABS) TABS Take by mouth.     oxybutynin (DITROPAN-XL) 10 MG 24 hr tablet Take 10 mg by mouth at bedtime.     pantoprazole (PROTONIX) 40 MG tablet Take by mouth.     rasagiline (AZILECT) 0.5 MG TABS tablet Take by mouth. Take 1 by mouth daily     tamsulosin (FLOMAX) 0.4 MG CAPS  capsule Take 0.4 mg by mouth.     Tiotropium Bromide Monohydrate 2.5 MCG/ACT AERS Inhale 2 puffs into the lungs daily.     vitamin C (ASCORBIC ACID) 500 MG tablet Take by mouth.     Capsaicin 0.1 % CREA APPLY THIN LAYER TO AFFECTED AREA TWICE A DAY AS NEEDED (Patient not taking: No sig reported)     diclofenac Sodium (VOLTAREN) 1 % GEL Apply topically. (Patient not taking: No sig reported)     No facility-administered medications prior to visit.    ROS: Review of Systems  Constitutional:  Positive for fatigue. Negative for appetite change and unexpected weight change.  HENT:  Negative for congestion, nosebleeds, sneezing, sore throat and trouble swallowing.   Eyes:  Negative for itching and visual disturbance.  Respiratory:  Negative for cough.   Cardiovascular:  Negative for chest pain, palpitations and leg swelling.  Gastrointestinal:  Negative for abdominal distention, blood in stool, diarrhea and nausea.  Genitourinary:  Negative for frequency and hematuria.  Musculoskeletal:  Positive for gait problem. Negative for back pain, joint swelling and neck pain.  Skin:  Negative for rash.  Neurological:  Positive for tremors. Negative for dizziness, speech difficulty and weakness.  Psychiatric/Behavioral:  Positive for decreased concentration. Negative for agitation, dysphoric mood, sleep disturbance and suicidal ideas. The patient is nervous/anxious.  Objective:  BP 132/64 (BP Location: Left Arm)   Pulse 86   Temp 98.2 F (36.8 C) (Oral)   Wt 154 lb (69.9 kg)   SpO2 95%   BMI 21.78 kg/m   BP Readings from Last 3 Encounters:  05/25/21 132/64  04/13/21 120/70  04/10/21 126/66    Wt Readings from Last 3 Encounters:  05/25/21 154 lb (69.9 kg)  04/13/21 154 lb 3.2 oz (69.9 kg)  03/01/21 152 lb 9.6 oz (69.2 kg)    Physical Exam Constitutional:      General: He is not in acute distress.    Appearance: He is well-developed.     Comments: NAD  Eyes:     Conjunctiva/sclera:  Conjunctivae normal.     Pupils: Pupils are equal, round, and reactive to light.  Neck:     Thyroid: No thyromegaly.     Vascular: No JVD.  Cardiovascular:     Rate and Rhythm: Normal rate and regular rhythm.     Heart sounds: Normal heart sounds. No murmur heard.   No friction rub. No gallop.  Pulmonary:     Effort: Pulmonary effort is normal. No respiratory distress.     Breath sounds: Normal breath sounds. No wheezing or rales.  Chest:     Chest wall: No tenderness.  Abdominal:     General: Bowel sounds are normal. There is no distension.     Palpations: Abdomen is soft. There is no mass.     Tenderness: There is no abdominal tenderness. There is no guarding or rebound.  Musculoskeletal:        General: No tenderness. Normal range of motion.     Cervical back: Normal range of motion.  Lymphadenopathy:     Cervical: No cervical adenopathy.  Skin:    General: Skin is warm and dry.     Findings: No rash.  Neurological:     Mental Status: He is alert and oriented to person, place, and time. Mental status is at baseline.     Cranial Nerves: No cranial nerve deficit.     Motor: No abnormal muscle tone.     Coordination: Coordination abnormal.     Gait: Gait abnormal.     Deep Tendon Reflexes: Reflexes are normal and symmetric.  Psychiatric:        Behavior: Behavior normal.        Thought Content: Thought content normal.        Judgment: Judgment normal.  Using a cane.  He is walking better Mild hand tremor  Lab Results  Component Value Date   WBC 3.4 (L) 02/01/2021   HGB 13.6 02/01/2021   HCT 39.8 02/01/2021   PLT 184.0 02/01/2021   GLUCOSE 95 02/01/2021   CHOL 225 (H) 02/01/2021   TRIG 56.0 02/01/2021   HDL 88.10 02/01/2021   LDLCALC 126 (H) 02/01/2021   ALT 14 02/01/2021   AST 18 02/01/2021   NA 140 02/01/2021   K 3.7 02/01/2021   CL 104 02/01/2021   CREATININE 1.02 02/01/2021   BUN 8 02/01/2021   CO2 29 02/01/2021   TSH 1.61 02/01/2021   PSA 5.09 (H)  02/01/2021    No results found.  Assessment & Plan:   Problem List Items Addressed This Visit     Atrial fibrillation ([place])    No relapse. Not on ASA      Essential hypertension    On NAS diet      Parkinson's disease ([place])    S/p  Neuro eval by Dr [name] on 03/02/21.  Lee [name] is a vague historian. Not taking Sinemet Look at [place] or [employer] sports      Weakness of both legs    Using a cane.  Not taking Sinemet         No orders of the defined types were placed in this encounter.     Follow-up: Return in about 3 months (around 08/25/2021) for a follow-up visit.  Walker Kehr, MD

## 2021-05-28 NOTE — Assessment & Plan Note (Signed)
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  Using Tylenol as needed

## 2021-05-29 DIAGNOSIS — N401 Enlarged prostate with lower urinary tract symptoms: Secondary | ICD-10-CM | POA: Diagnosis not present

## 2021-05-29 DIAGNOSIS — R350 Frequency of micturition: Secondary | ICD-10-CM | POA: Diagnosis not present

## 2021-05-29 DIAGNOSIS — J439 Emphysema, unspecified: Secondary | ICD-10-CM | POA: Diagnosis not present

## 2021-05-31 ENCOUNTER — Encounter: Payer: Self-pay | Admitting: Pulmonary Disease

## 2021-05-31 ENCOUNTER — Other Ambulatory Visit: Payer: Self-pay

## 2021-05-31 ENCOUNTER — Ambulatory Visit (INDEPENDENT_AMBULATORY_CARE_PROVIDER_SITE_OTHER): Payer: No Typology Code available for payment source | Admitting: Pulmonary Disease

## 2021-05-31 VITALS — BP 108/60 | HR 90 | Temp 98.4°F | Ht 71.65 in | Wt 153.0 lb

## 2021-05-31 DIAGNOSIS — J449 Chronic obstructive pulmonary disease, unspecified: Secondary | ICD-10-CM

## 2021-05-31 MED ORDER — MONTELUKAST SODIUM 10 MG PO TABS: 10.0000 mg | ORAL_TABLET | Freq: Every day | ORAL | 6 refills | Status: DC

## 2021-05-31 MED ORDER — MOMETASONE FURO-FORMOTEROL FUM 200-5 MCG/ACT IN AERO: 2.0000 | INHALATION_SPRAY | Freq: Two times a day (BID) | RESPIRATORY_TRACT | 2 refills | Status: AC

## 2021-05-31 NOTE — Patient Instructions (Signed)
  COPD --ARRANGE for pulmonary function tests prior to next visit --START Dulera 200-5 mcg TWO puffs TWICE a day --CONTINUE Spiriva 2.5 mcg TWO puffs ONCE a day --CONTINUE Albuterol nebulizer AS NEEDED for shortness of breath or wheezing --CONTINUE Singulair 10 mg daily. REFILL  Follow-up with me after PFTs in 3 months

## 2021-05-31 NOTE — Progress Notes (Signed)
Subjective:   PATIENT ID: Lee [name] GENDER: male DOB: 1946-05-21, MRN: [id number]   HPI  Chief Complaint  Patient presents with   Consult    COPD    Reason for Visit: New consult for COPD  [name] is a 75 year old male former smoker with Parkinson's disease with gait and balance impairment, atrial fibrillation and HTN who presents as new consult. History difficult to recall for patient.  He was referred by [facility]. [facility]. Faxed notes reviewed. He was previously seen by Dr. Gwenette Greet at the [facility]. He is on [place]. Has a nebulizer machine. Has not been taking singulair on a routine basis. He was diagnosed with COPD >10 years. He reports shortness of breath requiring pursed lip breathing. Denies cough and wheezing. His gait issues are more limiting and has previously completed physical therapy.   Social History: 17 pack- years. 1 pack per day. Quit in 1982. Cold [place] Previously [place]. Not active smoker  [employer]. Two years in [place]  I have personally reviewed patient's past medical/family/social history, allergies, current medications.  Past Medical History:  Diagnosis Date   Acid reflux    Alcohol abuse    quit 10 y ago   Arthritis    BPH (benign prostatic hyperplasia)    CAD (coronary artery disease)    Cervical spondylosis without myelopathy 06/02/2015   COPD (chronic obstructive pulmonary disease) (HCC)    Emphysema lung (HCC)    GERD (gastroesophageal reflux disease)    High cholesterol    Hypertension    Parkinson's disease ([place])    PVD (peripheral vascular disease) ([place])    Tremor 06/02/2015     Family History  Problem Relation Age of Onset   Cancer Brother 48       prostate ca   Irregular heart beat Mother    Irregular heart beat Father    Tremor Father    Cancer Sister      Social History   Occupational History   Occupation: retired  Tobacco Use   Smoking status:  Former    Types: Cigarettes    Quit date: 06/09/2005    Years since quitting: 15.9   Smokeless tobacco: Never  Substance and Sexual Activity   Alcohol use: No    Comment: quit 10 years ago   Drug use: No   Sexual activity: Yes    Allergies  Allergen Reactions   Acyclovir Swelling   Enalapril Maleate Other (See Comments)    Reaction:  Unknown      Outpatient Medications Prior to Visit  Medication Sig Dispense Refill   albuterol (PROVENTIL HFA;VENTOLIN HFA) 108 (90 Base) MCG/ACT inhaler Inhale 2 puffs into the lungs every 4 (four) hours as needed for wheezing or shortness of breath. 1 Inhaler 0   BLACK PEPPER-TURMERIC PO Take by mouth daily. Mixes as a powder for back pain (ginger, cinnamon, black pepper, turmeric)     CINNAMON PO Mixes as a powder for back pain (ginger, cinnamon, black pepper, turmeric)     Ergocalciferol (VITAMIN D2) 50 MCG (2000 UT) TABS Take by mouth.     FEROSUL 325 (65 Fe) MG tablet Take by mouth.     finasteride (PROSCAR) 5 MG tablet Take 5 mg by mouth daily.     fluticasone (FLONASE) 50 MCG/ACT nasal spray INSTILL 1 SPRAY IN EACH NOSTRIL TWICE A DAY FOR DIZZINESS     folic acid (FOLVITE) 1 MG tablet Take  1 mg by mouth daily.     Ginger, Zingiber officinalis, (GINGER PO) Mixes as a powder for back pain (ginger, cinnamon, black pepper, turmeric)     mometasone-formoterol (DULERA) 100-5 MCG/ACT AERO INHALE 2 INHALATIONS BY MOUTH TWICE A DAY (RINSE MOUTH WELL WITH WATER AFTER EACH USE) FOR BREATHING, COPD, ASTHMA.  REPLACES [place].     Multiple Vitamin (QUINTABS) TABS Take by mouth.     oxybutynin (DITROPAN-XL) 10 MG 24 hr tablet Take 10 mg by mouth at bedtime.     pantoprazole (PROTONIX) 40 MG tablet Take by mouth.     carbidopa-levodopa (SINEMET IR) 25-100 MG tablet 1.5 tablets 3 times daily for 1 month, then take 2 tablets 3 times daily (Patient taking differently: Take 1.5 tablets by mouth 3 (three) times daily.) 540 tablet 1   rasagiline  (AZILECT) 0.5 MG TABS tablet Take by mouth. Take 1 by mouth daily     tamsulosin (FLOMAX) 0.4 MG CAPS capsule Take 0.4 mg by mouth.     Tiotropium Bromide Monohydrate 2.5 MCG/ACT AERS Inhale 2 puffs into the lungs daily.     vitamin C (ASCORBIC ACID) 500 MG tablet Take by mouth.     No facility-administered medications prior to visit.    Review of Systems  Constitutional:  Negative for chills, diaphoresis, fever, malaise/fatigue and weight loss.  HENT:  Negative for congestion, ear pain and sore throat.   Respiratory:  Positive for shortness of breath. Negative for cough, hemoptysis, sputum production and wheezing.   Cardiovascular:  Negative for chest pain, palpitations and leg swelling.  Gastrointestinal:  Negative for abdominal pain, heartburn and nausea.  Genitourinary:  Negative for frequency.  Musculoskeletal:  Negative for joint pain and myalgias.  Skin:  Negative for itching and rash.  Neurological:  Negative for dizziness, weakness and headaches.  Endo/Heme/Allergies:  Does not bruise/bleed easily.  Psychiatric/Behavioral:  Negative for depression. The patient is not nervous/anxious.     Objective:   Vitals:   05/31/21 1033  BP: 108/60  Pulse: 90  Temp: 98.4 F (36.9 C)  TempSrc: Oral  SpO2: 98%  Weight: 153 lb (69.4 kg)  Height: 5' 11.65" (1.82 m)     Physical Exam: General: Well-appearing, no acute distress HENT: [place], AT Eyes: EOMI, no scleral icterus Respiratory: Clear to auscultation bilaterally.  No crackles, wheezing or rales Cardiovascular: RRR, -M/R/G, no JVD Extremities:-Edema,-tenderness Neuro: AAO x4, CNII-XII grossly intact Psych: Normal mood, normal affect  Data Reviewed:  Imaging: CXR 02/01/21 - Normal. No infiltrate, effusion or edema.  PFT: None on file  Labs: CBC    Component Value Date/Time   WBC 3.4 (L) 02/01/2021 1529   RBC 4.12 (L) 02/01/2021 1529   HGB 13.6 02/01/2021 1529   HCT 39.8 02/01/2021 1529   PLT 184.0 02/01/2021 1529    MCV 96.6 02/01/2021 1529   MCH 32.9 01/05/2017 0250   MCHC 34.2 02/01/2021 1529   RDW 12.9 02/01/2021 1529   LYMPHSABS 0.9 02/01/2021 1529   MONOABS 0.4 02/01/2021 1529   EOSABS 0.3 02/01/2021 1529   BASOSABS 0.0 02/01/2021 1529   Absolute 02/01/21 - 300    Assessment & Plan:   Discussion: 75 year old male former smoker with Parkinson's disease with gait and balance impairment, atrial fibrillation and HTN who presents for new consult. For persistent shortness of breath, will increase ICS strength. Counseled on potential side effects including thrush, hoarseness. We reviewed his bronchodilator regimen and counseled on course of COPD and management of exacerbations.  COPD --ARRANGE for pulmonary function tests prior to next visit --START Dulera 200-5 mcg TWO puffs TWICE a day --CONTINUE Spiriva 2.5 mcg TWO puffs ONCE a day --CONTINUE Albuterol nebulizer AS NEEDED for shortness of breath or wheezing --CONTINUE Singulair 10 mg daily. REFILL  Action Plan For worsening shortness of breath, wheezing and cough that do not improve in 24-48 hours, please our office for evaluation and/or prednisone taper.  Health Maintenance Immunization History  Administered Date(s) Administered   Fluad Quad(high Dose 65+) 04/29/2020, 05/10/2020, 05/02/2021   Influenza Split 05/31/2015   Influenza Whole 07/25/2007   Influenza, High Dose Seasonal PF 03/30/2013, 04/15/2013, 04/29/2017, 04/23/2018, 04/27/2019, 05/21/2019   Influenza,inj,Quad PF,6+ Mos 05/28/2016   Influenza-Unspecified 05/31/2007, 06/18/2008, 05/16/2009, 04/11/2010, 04/29/2010, 07/27/2011, 05/15/2012, 05/12/2014   Moderna Sars-Covid-2 Vaccination 08/25/2019, 09/22/2019, 06/24/2020, 12/08/2020   Pneumococcal Conjugate-13 06/29/2014   Pneumococcal Polysaccharide-23 05/28/2013   Pneumococcal-Unspecified 05/06/2007   Rabies, IM 04/20/2020   Tdap 09/07/2011   Zoster Recombinat (Shingrix) 12/11/2019, 02/16/2020   CT Lung Screen - not  qualified. >15 years since quit  No orders of the defined types were placed in this encounter. Meds ordered this encounter  Medications   mometasone-formoterol (DULERA) 200-5 MCG/ACT AERO    Sig: Inhale 2 puffs into the lungs in the morning and at bedtime.    Dispense:  3 each    Refill:  2   montelukast (SINGULAIR) 10 MG tablet    Sig: Take 1 tablet (10 mg total) by mouth at bedtime.    Dispense:  30 tablet    Refill:  6    Return in about 3 months (around 08/31/2021).  I have spent a total time of 45-minutes on the day of the appointment reviewing prior documentation, coordinating care and discussing medical diagnosis and plan with the patient/family. Imaging, labs and tests included in this note have been reviewed and interpreted independently by me.  [place], MD [facility] 05/31/2021 10:33 AM  Office Number [phone]

## 2021-07-05 ENCOUNTER — Encounter: Payer: Self-pay | Admitting: Diagnostic Neuroimaging

## 2021-07-05 ENCOUNTER — Ambulatory Visit (INDEPENDENT_AMBULATORY_CARE_PROVIDER_SITE_OTHER): Payer: Medicare Other | Admitting: Diagnostic Neuroimaging

## 2021-07-05 ENCOUNTER — Other Ambulatory Visit: Payer: Self-pay

## 2021-07-05 VITALS — BP 112/68 | HR 88 | Ht 70.5 in | Wt 151.2 lb

## 2021-07-05 DIAGNOSIS — R269 Unspecified abnormalities of gait and mobility: Secondary | ICD-10-CM

## 2021-07-05 DIAGNOSIS — K5900 Constipation, unspecified: Secondary | ICD-10-CM | POA: Diagnosis not present

## 2021-07-05 DIAGNOSIS — G20 Parkinson's disease: Secondary | ICD-10-CM | POA: Diagnosis not present

## 2021-07-05 DIAGNOSIS — F419 Anxiety disorder, unspecified: Secondary | ICD-10-CM | POA: Diagnosis not present

## 2021-07-05 MED ORDER — MIRTAZAPINE 7.5 MG PO TABS: 7.5000 mg | ORAL_TABLET | Freq: Every day | ORAL | 6 refills | Status: DC

## 2021-07-05 MED ORDER — CARBIDOPA-LEVODOPA 25-100 MG PO TABS: 2.0000 | ORAL_TABLET | Freq: Three times a day (TID) | ORAL | 4 refills | Status: DC

## 2021-07-05 NOTE — Progress Notes (Signed)
[facility]  PATIENT: Lee [name] DOB: [birth year]  REFERRING CLINICIAN: [name], [name] Lacks, MD HISTORY FROM: PATIENT  REASON FOR VISIT: NEW CONSULT   HISTORICAL  CHIEF COMPLAINT:  Chief Complaint  Patient presents with   Right leg weakness    Rm 6 New Pt, pt of Dr [name]  "no improvement on anything- my right leg, my tremors"    Parkinson's disease    HISTORY OF PRESENT ILLNESS:   UPDATE (07/05/21, [facility]): Since last visit, doing about the same. Tremor in [place] in 2016, dx'd with PD. Carb/levo not helping that much. Not on rasagiline. More anxiety, insomnia, constipation, poor appetite, weight loss.  PRIOR HPI (02/28/21, [place]): Lee [name] is a 75 year old right-handed black male with a history of neck and low back pain.  The patient was seen through this office in 2016 with a right-sided tremor.  The patient since has been diagnosed with Parkinson's disease and is followed through the [facility] hospital for this, but he has had a lot of difficulty tolerating Sinemet and currently he is not on the medication.  He has had some difficulty walking since around 2018 or 2019, more recently he has had to use a cane to get around.  He used to do quite a bit of walking but he is no longer able to do this.  The patient has had some right-sided low back pain, he underwent an injection in the back on 19 January 2021.  Following the injection he had worsening pain that now is on the left and right side of the back, he has some discomfort into the right buttock and hip and into the right knee area.  He feels that his ability to ambulate has deteriorated, the right leg feels weaker.  The patient does have some tremor involving the right arm but not the right leg.  He notes that his handwriting is sloppy.  He has noted some problems with swallowing at times, solids and liquids but denies drooling.  He apparently had a DaTscan that is consistent with parkinsonism.  He reports some numbness in  the feet at this point and does have some problems with constipation.  He has occasional episodes of vertigo and some slight headache at times.  He reports no weakness in the arms.  Given the change in his ability to walk, he is sent to this office for further evaluation.    REVIEW OF SYSTEMS: Full 14 system review of systems performed and negative with exception of: as per HPI.  ALLERGIES: Allergies  Allergen Reactions   Acyclovir Swelling   Enalapril Maleate Other (See Comments)    Reaction:  Unknown     HOME MEDICATIONS: Outpatient Medications Prior to Visit  Medication Sig Dispense Refill   albuterol (PROVENTIL HFA;VENTOLIN HFA) 108 (90 Base) MCG/ACT inhaler Inhale 2 puffs into the lungs every 4 (four) hours as needed for wheezing or shortness of breath. 1 Inhaler 0   BLACK PEPPER-TURMERIC PO Take by mouth daily. Mixes as a powder for back pain (ginger, cinnamon, black pepper, turmeric)     CINNAMON PO Mixes as a powder for back pain (ginger, cinnamon, black pepper, turmeric)     Ergocalciferol (VITAMIN D2) 50 MCG (2000 UT) TABS Take by mouth.     FEROSUL 325 (65 Fe) MG tablet Take by mouth.     finasteride (PROSCAR) 5 MG tablet Take 5 mg by mouth daily.     fluticasone (FLONASE) 50 MCG/ACT nasal spray INSTILL 1 SPRAY IN EACH NOSTRIL  TWICE A DAY FOR DIZZINESS     folic acid (FOLVITE) 1 MG tablet Take 1 mg by mouth daily.     Ginger, Zingiber officinalis, (GINGER PO) Mixes as a powder for back pain (ginger, cinnamon, black pepper, turmeric)     mometasone-formoterol (DULERA) 200-5 MCG/ACT AERO Inhale 2 puffs into the lungs in the morning and at bedtime. 3 each 2   montelukast (SINGULAIR) 10 MG tablet Take 1 tablet (10 mg total) by mouth at bedtime. 30 tablet 6   Multiple Vitamin (QUINTABS) TABS Take by mouth.     oxybutynin (DITROPAN-XL) 10 MG 24 hr tablet Take 10 mg by mouth at bedtime.     pantoprazole (PROTONIX) 40 MG tablet Take by mouth.     tamsulosin (FLOMAX) 0.4 MG CAPS  capsule Take 0.4 mg by mouth.     Tiotropium Bromide Monohydrate 2.5 MCG/ACT AERS Inhale 2 puffs into the lungs daily.     vitamin C (ASCORBIC ACID) 500 MG tablet Take by mouth.     carbidopa-levodopa (SINEMET IR) 25-100 MG tablet 1.5 tablets 3 times daily for 1 month, then take 2 tablets 3 times daily (Patient taking differently: Take 1.5 tablets by mouth 3 (three) times daily.) 540 tablet 1   rasagiline (AZILECT) 0.5 MG TABS tablet Take by mouth. Take 1 by mouth daily (Patient not taking: Reported on 07/05/2021)     No facility-administered medications prior to visit.    PAST MEDICAL HISTORY: Past Medical History:  Diagnosis Date   Acid reflux    Alcohol abuse    quit 10 y ago   Arthritis    BPH (benign prostatic hyperplasia)    CAD (coronary artery disease)    Cervical spondylosis without myelopathy 06/02/2015   COPD (chronic obstructive pulmonary disease) (HCC)    Emphysema lung (HCC)    GERD (gastroesophageal reflux disease)    High cholesterol    Hypertension    Parkinson's disease ([place])    PVD (peripheral vascular disease) ([place])    Tremor 06/02/2015    PAST SURGICAL HISTORY: Past Surgical History:  Procedure Laterality Date   TONSILLECTOMY     VASECTOMY      FAMILY HISTORY: Family History  Problem Relation Age of Onset   Cancer Brother 64       prostate ca   Irregular heart beat Mother    Irregular heart beat Father    Tremor Father    Cancer Sister     SOCIAL HISTORY: Social History   Socioeconomic History   Marital status: Single    Spouse name: Not on file   Number of children: 0   Years of education: 14   Highest education level: Not on file  Occupational History   Occupation: retired  Tobacco Use   Smoking status: Former    Types: Cigarettes    Quit date: 06/09/2005    Years since quitting: 16.0   Smokeless tobacco: Never  Substance and Sexual Activity   Alcohol use: No    Comment: quit 10 years ago   Drug use: No   Sexual activity: Yes   Other Topics Concern   Not on file  Social History Narrative   Lives alone   Patient does not drink caffeine.   Patient is right handed.    Social Determinants of Health   Financial Resource Strain: Low Risk    Difficulty of Paying Living Expenses: Not hard at all  Food Insecurity: No Food Insecurity   Worried About Charity fundraiser  in the Last Year: Never true   [place] in the Last Year: Never true  Transportation Needs: No Transportation Needs   Lack of Transportation (Medical): No   Lack of Transportation (Non-Medical): No  Physical Activity: Inactive   Days of Exercise per Week: 0 days   Minutes of Exercise per Session: 0 min  Stress: No Stress Concern Present   Feeling of Stress : Not at all  Social Connections: Moderately Integrated   Frequency of Communication with Friends and Family: More than three times a week   Frequency of Social Gatherings with Friends and Family: Once a week   Attends Religious Services: More than 4 times per year   Active Member of [employer] or Organizations: Yes   Attends Music therapist: More than 4 times per year   Marital Status: Divorced  Human resources officer Violence: Not At Risk   Fear of Current or Ex-Partner: No   Emotionally Abused: No   Physically Abused: No   Sexually Abused: No     PHYSICAL EXAM  GENERAL EXAM/CONSTITUTIONAL: Vitals:  Vitals:   07/05/21 1048  BP: 112/68  Pulse: 88  Weight: 151 lb 3.2 oz (68.6 kg)  Height: 5' 10.5" (1.791 m)   Body mass index is 21.39 kg/m. Wt Readings from Last 3 Encounters:  07/05/21 151 lb 3.2 oz (68.6 kg)  05/31/21 153 lb (69.4 kg)  05/25/21 154 lb (69.9 kg)   Patient is in no distress; well developed, nourished and groomed; neck is supple  CARDIOVASCULAR: Examination of carotid arteries is normal; no carotid bruits Regular rate and rhythm, no murmurs Examination of peripheral vascular system by observation and palpation is normal  EYES: Ophthalmoscopic  exam of optic discs and posterior segments is normal; no papilledema or hemorrhages No results found.  MUSCULOSKELETAL: Gait, strength, tone, movements noted in Neurologic exam below  NEUROLOGIC: MENTAL STATUS:  No flowsheet data found. awake, alert, oriented to person, place and time recent and remote memory intact normal attention and concentration language fluent, comprehension intact, naming intact fund of knowledge appropriate  CRANIAL NERVE:  2nd - no papilledema on fundoscopic exam 2nd, 3rd, 4th, 6th - pupils equal and reactive to light, visual fields full to confrontation, extraocular muscles intact, no nystagmus 5th - facial sensation symmetric 7th - facial strength symmetric 8th - hearing intact 9th - palate elevates symmetrically, uvula midline 11th - shoulder shrug symmetric 12th - tongue protrusion midline STUTTERING SPEECH  MOTOR:  RARE INTERMITENT TREMOR IN RUE NORMAL TONE MOD BRADYKINESIA IN BUE AND BLE normal bulk and tone, full strength in the BUE, BLE  SENSORY:  normal and symmetric to light touch, temperature, vibration  COORDINATION:  finger-nose-finger, fine finger movements normal  REFLEXES:  deep tendon reflexes present and symmetric  GAIT/STATION:  narrow based gait     DIAGNOSTIC DATA (LABS, IMAGING, TESTING) - I reviewed patient records, labs, notes, testing and imaging myself where available.  Lab Results  Component Value Date   WBC 3.4 (L) 02/01/2021   HGB 13.6 02/01/2021   HCT 39.8 02/01/2021   MCV 96.6 02/01/2021   PLT 184.0 02/01/2021      Component Value Date/Time   NA 140 02/01/2021 1529   K 3.7 02/01/2021 1529   CL 104 02/01/2021 1529   CO2 29 02/01/2021 1529   GLUCOSE 95 02/01/2021 1529   BUN 8 02/01/2021 1529   CREATININE 1.02 02/01/2021 1529   CALCIUM 9.7 02/01/2021 1529   PROT 7.0 02/01/2021 1529  ALBUMIN 4.4 02/01/2021 1529   AST 18 02/01/2021 1529   ALT 14 02/01/2021 1529   ALKPHOS 53 02/01/2021 1529    BILITOT 0.8 02/01/2021 1529   GFRNONAA >60 01/05/2017 0250   GFRAA >60 01/05/2017 0250   Lab Results  Component Value Date   CHOL 225 (H) 02/01/2021   HDL 88.10 02/01/2021   LDLCALC 126 (H) 02/01/2021   TRIG 56.0 02/01/2021   CHOLHDL 3 02/01/2021   No results found for: HGBA1C Lab Results  Component Value Date   VITAMINB12 441 02/01/2021   Lab Results  Component Value Date   TSH 1.61 02/01/2021    10/12/20 datscan - Abnormal image grade 2: Significant bilateral reduction in putamen uptake with activity largely confined to the caudate nuclei.  - This pattern of activity can be seen with Parkinson's disease or related syndromes.    ASSESSMENT AND PLAN  75 y.o. year old male here with:   Dx:  1. Parkinson's disease ([place])   2. Gait abnormality   3. Anxiety   4. Constipation, unspecified constipation type       PLAN:  PARKINSON'S DISEASE - increase carb/levo to 2 tabs three times a day  - start mitrazepine 7.5mg  at bedtime (for insomnia, anxiety, poor appetite) - use cane; consider rollator walker - continue PT exercises  Meds ordered this encounter  Medications   carbidopa-levodopa (SINEMET IR) 25-100 MG tablet    Sig: Take 2 tablets by mouth 3 (three) times daily.    Dispense:  540 tablet    Refill:  4   mirtazapine (REMERON) 7.5 MG tablet    Sig: Take 1 tablet (7.5 mg total) by mouth at bedtime.    Dispense:  30 tablet    Refill:  6   Return in about 6 months (around 01/03/2022).    Penni Bombard, MD 24/02/2499, 37:04 AM Certified in Neurology, Neurophysiology and Neuroimaging  [facility] [address], [place], [place] [address] [phone]

## 2021-07-05 NOTE — Patient Instructions (Addendum)
  PARKINSON'S DISEASE  - increase carb/levo to 2 tabs three times a day   - start mitrazepine 7.5mg  at bedtime (for insomnia, anxiety, poor appetite)  - use cane; consider rollator walker  - continue PT exercises

## 2021-07-10 ENCOUNTER — Other Ambulatory Visit: Payer: Self-pay

## 2021-07-10 ENCOUNTER — Ambulatory Visit: Payer: No Typology Code available for payment source

## 2021-07-10 DIAGNOSIS — G20 Parkinson's disease: Secondary | ICD-10-CM

## 2021-07-10 DIAGNOSIS — J439 Emphysema, unspecified: Secondary | ICD-10-CM

## 2021-07-10 NOTE — Progress Notes (Addendum)
Chronic Care Management Pharmacy Note  07/10/2021 Name:  Lee [name] MRN:  [id number] DOB:  11-15-1945  Summary: -Patient was recently to neurology - was prescribed mirtazapine and increased sinement - patient has not started mirtazapine yet as he has not received from [place] yet- has increased sinemet - denies any major changes since increase -Notes that he was to see [place] PCP last week (note not available at this time) - patient reports that 3 medications were added - could not recall the medications that had been started - but due to vertigo that developed after starting the medication he had to stop - has follow up with [place] PCP later this week to discuss -dulera increased to 200/13mg - 2 puffs twice daily - patient notes that he feels like breathing has been slightly harder - possible that colder weather has impacted this as well -Medication list was updated to the best of my ability - patient was not in his home and was not aware of all of his current medications at time of appointment   Recommendations/Changes made from today's visit: -Recommending no changes to medications at this time, patient to continue with increased dose of sinemet per neurology - patient will start mirtazapine once received - will update with any issues or concerns    Subjective: NSamaad [name] an 75y.o. year old male who is a primary patient of [name], [name].  The CCM team was consulted for assistance with disease management and care coordination needs.    Engaged with patient by telephone for follow up visit in response to provider referral for pharmacy case management and/or care coordination services.   Consent to Services:  The patient was given the following information about Chronic Care Management services today, agreed to services, and gave verbal consent: 1. CCM service includes personalized support from designated clinical staff supervised by the primary care provider,  including individualized plan of care and coordination with other care providers 2. 24/7 contact phone numbers for assistance for urgent and routine care needs. 3. Service will only be billed when office clinical staff spend 20 minutes or more in a month to coordinate care. 4. Only one practitioner may furnish and bill the service in a calendar month. 5.The patient may stop CCM services at any time (effective at the end of the month) by phone call to the office staff. 6. The patient will be responsible for cost sharing (co-pay) of up to 20% of the service fee (after annual deductible is met). Patient agreed to services and consent obtained.  Patient Care Team: [name], [name] as PCP - General (Internal Medicine) Armour, RAzalia Bilis MD as Referring Physician (Internal Medicine) YRush Farmer MD (Pulmonary Disease) PJoelene MillinPA-C as Physician Assistant (Orthopedic Surgery) MFeliberto Hartsas Referring Physician (Optometry) Kaitlin Ardito, DDarnelle Maffucci RLake [facility] LLCas Pharmacist (Pharmacist) WKathrynn Ducking MD as Consulting Physician (Neurology)  Recent office visits:  05/25/2021 - Dr. PChristen Bame- referred dto neurology for evaluation of PD - stopped capsaicin cream and diclofenac gel     Recent consult visits:  07/05/2021 - Dr. PLeta Baptist- Neurology - increase carb/levo to 2 tablets TID, start mirtazapine at bedtime  05/31/2021 - Dr. ELoanne Drilling- Pulmonology - increased dulera - continue spiriva, albuterol, and sBrightiside Surgicalvisits:  None since last visit   Objective:  Lab Results  Component Value Date   CREATININE 1.02 02/01/2021   BUN 8 02/01/2021   GFR 72.89 02/01/2021  GFRNONAA >60 01/05/2017   GFRAA >60 01/05/2017   NA 140 02/01/2021   K 3.7 02/01/2021   CALCIUM 9.7 02/01/2021   CO2 29 02/01/2021   GLUCOSE 95 02/01/2021    Lab Results  Component Value Date/Time   GFR 72.89 02/01/2021 03:29 PM    Last diabetic Eye exam:  No results found for: HMDIABEYEEXA   Last diabetic Foot exam:  No results found for: HMDIABFOOTEX   Lab Results  Component Value Date   CHOL 225 (H) 02/01/2021   HDL 88.10 02/01/2021   LDLCALC 126 (H) 02/01/2021   TRIG 56.0 02/01/2021   CHOLHDL 3 02/01/2021    Hepatic Function Latest Ref Rng & Units 02/01/2021  Total Protein 6.0 - 8.3 g/dL 7.0  Albumin 3.5 - 5.2 g/dL 4.4  AST 0 - 37 U/L 18  ALT 0 - 53 U/L 14  Alk Phosphatase 39 - 117 U/L 53  Total Bilirubin 0.2 - 1.2 mg/dL 0.8    Lab Results  Component Value Date/Time   TSH 1.61 02/01/2021 03:29 PM   FREET4 0.86 02/01/2021 03:29 PM    CBC Latest Ref Rng & Units 02/01/2021 01/05/2017 09/20/2016  WBC 4.0 - 10.5 K/uL 3.4(L) 6.5 3.2(L)  Hemoglobin 13.0 - 17.0 g/dL 13.6 13.9 14.0  Hematocrit 39.0 - 52.0 % 39.8 40.5 40.4  Platelets 150.0 - 400.0 K/uL 184.0 199 172    Lab Results  Component Value Date/Time   VD25OH 57.33 02/01/2021 03:29 PM    Clinical ASCVD: No  The 10-year ASCVD risk score (Arnett DK, et al., 2019) is: 15.2%   Values used to calculate the score:     Age: 75 years     Sex: Male     Is Non-Hispanic African American: Yes     Diabetic: No     Tobacco smoker: Yes     Systolic Blood Pressure: 952 mmHg     Is BP treated: No     HDL Cholesterol: 88.1 mg/dL     Total Cholesterol: 225 mg/dL    Depression screen [facility] 2/9 03/23/2021 02/01/2021 09/21/2019  Decreased Interest 0 0 0  Down, Depressed, Hopeless 0 0 0  PHQ - 2 Score 0 0 0  Altered sleeping 0 0 0  Tired, decreased energy 0 0 0  Change in appetite 0 0 0  Feeling bad or failure about yourself  0 0 0  Trouble concentrating 0 0 0  Moving slowly or fidgety/restless 0 0 0  Suicidal thoughts 0 0 0  PHQ-9 Score 0 0 0  Difficult doing work/chores - - Not difficult at all     Social History   Tobacco Use  Smoking Status Former   Types: Cigarettes   Quit date: 06/09/2005   Years since quitting: 16.0  Smokeless Tobacco Never   BP Readings from Last 3 Encounters:  07/05/21 112/68   05/31/21 108/60  05/25/21 132/64   Pulse Readings from Last 3 Encounters:  07/05/21 88  05/31/21 90  05/25/21 86   Wt Readings from Last 3 Encounters:  07/05/21 151 lb 3.2 oz (68.6 kg)  05/31/21 153 lb (69.4 kg)  05/25/21 154 lb (69.9 kg)   BMI Readings from Last 3 Encounters:  07/05/21 21.39 kg/m  05/31/21 20.95 kg/m  05/25/21 21.78 kg/m    Assessment/Interventions: Review of patient past medical history, allergies, medications, health status, including review of consultants reports, laboratory and other test data, was performed as part of comprehensive evaluation and provision of chronic care management services.  SDOH:  (Social Determinants of Health) assessments and interventions performed: Yes  SDOH Screenings   Alcohol Screen: Low Risk    Last Alcohol Screening Score (AUDIT): 0  Depression (PHQ2-9): Low Risk    PHQ-2 Score: 0  Financial Resource Strain: Low Risk    Difficulty of Paying Living Expenses: Not hard at all  Food Insecurity: No Food Insecurity   Worried About Charity fundraiser in the Last Year: Never true   Ran Out of Food in the Last Year: Never true  Housing: Low Risk    Last Housing Risk Score: 0  Physical Activity: Inactive   Days of Exercise per Week: 0 days   Minutes of Exercise per Session: 0 min  Social Connections: Moderately Integrated   Frequency of Communication with Friends and Family: More than three times a week   Frequency of Social Gatherings with Friends and Family: Once a week   Attends Religious Services: More than 4 times per year   Active Member of [employer] or Organizations: Yes   Attends Music therapist: More than 4 times per year   Marital Status: Divorced  Stress: No Stress Concern Present   Feeling of Stress : Not at all  Tobacco Use: Medium Risk   Smoking Tobacco Use: Former   Smokeless Tobacco Use: Never   Passive Exposure: Not on Pensions consultant Needs: No Transportation Needs   Lack of  Transportation (Medical): No   Lack of Transportation (Non-Medical): No    CCM Care Plan  Allergies  Allergen Reactions   Acyclovir Swelling   Enalapril Maleate Other (See Comments)    Reaction:  Unknown     Medications Reviewed Today     Reviewed by Penni Bombard, MD (Physician) on 07/05/21 at 1129  Med List Status: <None>   Medication Order Taking? Sig Documenting Provider Last Dose Status Informant  albuterol (PROVENTIL HFA;VENTOLIN HFA) 108 (90 Base) MCG/ACT inhaler [id number] Yes Inhale 2 puffs into the lungs every 4 (four) hours as needed for wheezing or shortness of breath. Delora Fuel, MD Taking Active   BLACK [facility] PO [id number] Yes Take by mouth daily. Mixes as a powder for back pain (ginger, cinnamon, black pepper, turmeric) [provider] Taking Active   carbidopa-levodopa (SINEMET IR) 25-100 MG tablet [id number]  Take 2 tablets by mouth 3 (three) times daily. Penni Bombard, MD  Active   CINNAMON PO [id number] Yes Mixes as a powder for back pain (ginger, cinnamon, black pepper, turmeric) [provider] Taking Active   Ergocalciferol (VITAMIN D2) 50 MCG (2000 UT) TABS [id number] Yes Take by mouth. [provider] Taking Active   FEROSUL 325 (65 Fe) MG tablet [id number] Yes Take by mouth. [provider] Taking Active   finasteride (PROSCAR) 5 MG tablet [id number] Yes Take 5 mg by mouth daily. [provider] Taking Active   fluticasone (FLONASE) 50 MCG/ACT nasal spray [id number] Yes INSTILL 1 SPRAY IN EACH NOSTRIL TWICE A DAY FOR DIZZINESS [provider] Taking Active   folic acid (FOLVITE) 1 MG tablet [id number] Yes Take 1 mg by mouth daily. [provider] Taking Active Self  Ginger, Zingiber officinalis, (GINGER PO) [id number] Yes Mixes as a powder for back pain (ginger, cinnamon, black pepper, turmeric) [provider] Taking Active   mirtazapine (REMERON) 7.5 MG tablet [id number] Yes  Take 1 tablet (7.5 mg total) by mouth at bedtime. Penni Bombard, MD  Active   mometasone-formoterol [facility]) 200-5 MCG/ACT AERO [id number] Yes Inhale  2 puffs into the lungs in the morning and at bedtime. Margaretha Seeds, MD Taking Active   montelukast (SINGULAIR) 10 MG tablet [id number] Yes Take 1 tablet (10 mg total) by mouth at bedtime. Margaretha Seeds, MD Taking Active   Multiple Vitamin Artis Flock) TABS [id number] Yes Take by mouth. [provider] Taking Active   oxybutynin (DITROPAN-XL) 10 MG 24 hr tablet [id number] Yes Take 10 mg by mouth at bedtime. [provider] Taking Active   pantoprazole (PROTONIX) 40 MG tablet [id number] Yes Take by mouth. [provider] Taking Active   tamsulosin (FLOMAX) 0.4 MG CAPS capsule [id number] Yes Take 0.4 mg by mouth. [provider] Taking Active   Tiotropium Bromide Monohydrate 2.5 MCG/ACT AERS [id number] Yes Inhale 2 puffs into the lungs daily. [provider] Taking Active Self  vitamin C (ASCORBIC ACID) 500 MG tablet [id number] Yes Take by mouth. [provider] Taking Active   Med List Note Corena Herter 01/05/17 0354): Pt also uses the [facility] in [place], [place].              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   Emphysema of lung ([place]) 06/04/2019   Spondylolisthesis at L4-L5 level 08/27/2017   Spinal stenosis in cervical region 08/27/2017   Tremor 06/02/2015   Cervical spondylosis without myelopathy 06/02/2015   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   HIP PAIN 11/27/2007   Essential  hypertension 07/25/2007   Atrial fibrillation ([place]) 07/25/2007   GERD 07/25/2007   ERECTILE DYSFUNCTION 07/25/2007   LOW BACK PAIN 07/25/2007    Immunization History  Administered Date(s) Administered   Fluad Quad(high Dose 65+) 04/29/2020, 05/10/2020, 05/02/2021   Influenza Split 05/31/2015   Influenza Whole 07/25/2007   Influenza, High Dose Seasonal PF 03/30/2013, 04/15/2013, 04/29/2017, 04/23/2018, 04/27/2019, 05/21/2019   Influenza,inj,Quad PF,6+ Mos 05/28/2016   Influenza-Unspecified 05/31/2007, 06/18/2008, 05/16/2009, 04/11/2010, 04/29/2010, 07/27/2011, 05/15/2012, 05/12/2014   Moderna Sars-Covid-2 Vaccination 08/25/2019, 09/22/2019, 06/24/2020, 12/08/2020   Pneumococcal Conjugate-13 06/29/2014   Pneumococcal Polysaccharide-23 05/28/2013   Pneumococcal-Unspecified 05/06/2007   Rabies, IM 04/20/2020   Tdap 09/07/2011   Zoster Recombinat (Shingrix) 12/11/2019, 02/16/2020    Conditions to be addressed/monitored:  BPH and Parkinson's Disease  There are no care plans that you recently modified to display for this patient.   Medication Assistance: None required.  Patient affirms current coverage meets needs.   Care Gaps: Hep C screening Colonoscopy COVID booster  Patient's preferred pharmacy is:  [facility], [place] - [address] [place] [address] Phone: [phone] Fax: [phone]   Uses pill box? No - feels he is able to manage without  Pt endorses 100% compliance  Care Plan and Follow Up Patient Decision:  Patient agrees to Care Plan and Follow-up.  Plan: Telephone follow up appointment with care management team member scheduled for:  3 months, The patient has been provided with contact information for the care management team and has been advised to call with any health related questions or concerns.  [name], PharmD Clinical Pharmacist, [place]  screening examination/treatment/procedure(s) were performed by non-physician practitioner and as supervising physician I was immediately available for consultation/collaboration.  I agree with above. Lew Dawes, MD

## 2021-07-10 NOTE — Patient Instructions (Signed)
Visit Information  Following are the goals we discussed today:   Manage My Medications   Timeframe:  Long-Range Goal Priority:  High Start Date:   05/10/2021                          Expected End Date:  07/10/2022                     Follow Up Date 10/08/2021   - call if I am sick and can't take my medicine - keep a list of all the medicines I take; vitamins and herbals too - learn to read medicine labels    Why is this important?   These steps will help you keep on track with your medicines  Plan: Telephone follow up appointment with care management team member scheduled for:  3 months The patient has been provided with contact information for the care management team and has been advised to call with any health related questions or concerns.   [name], PharmD Clinical Pharmacist, [facility]   Please call the care guide team at [phone] if you need to cancel or reschedule your appointment.   Patient verbalizes understanding of instructions provided today and agrees to view in [place].

## 2021-07-13 ENCOUNTER — Ambulatory Visit: Payer: No Typology Code available for payment source | Admitting: Internal Medicine

## 2021-07-18 IMAGING — DX DG CHEST 2V
2 series · 2 of 2 positions shown · non-contrast
Comparison: January 05, 2017.

CLINICAL DATA: Shortness of breath.

EXAM:
CHEST - 2 VIEW

[chest pa]
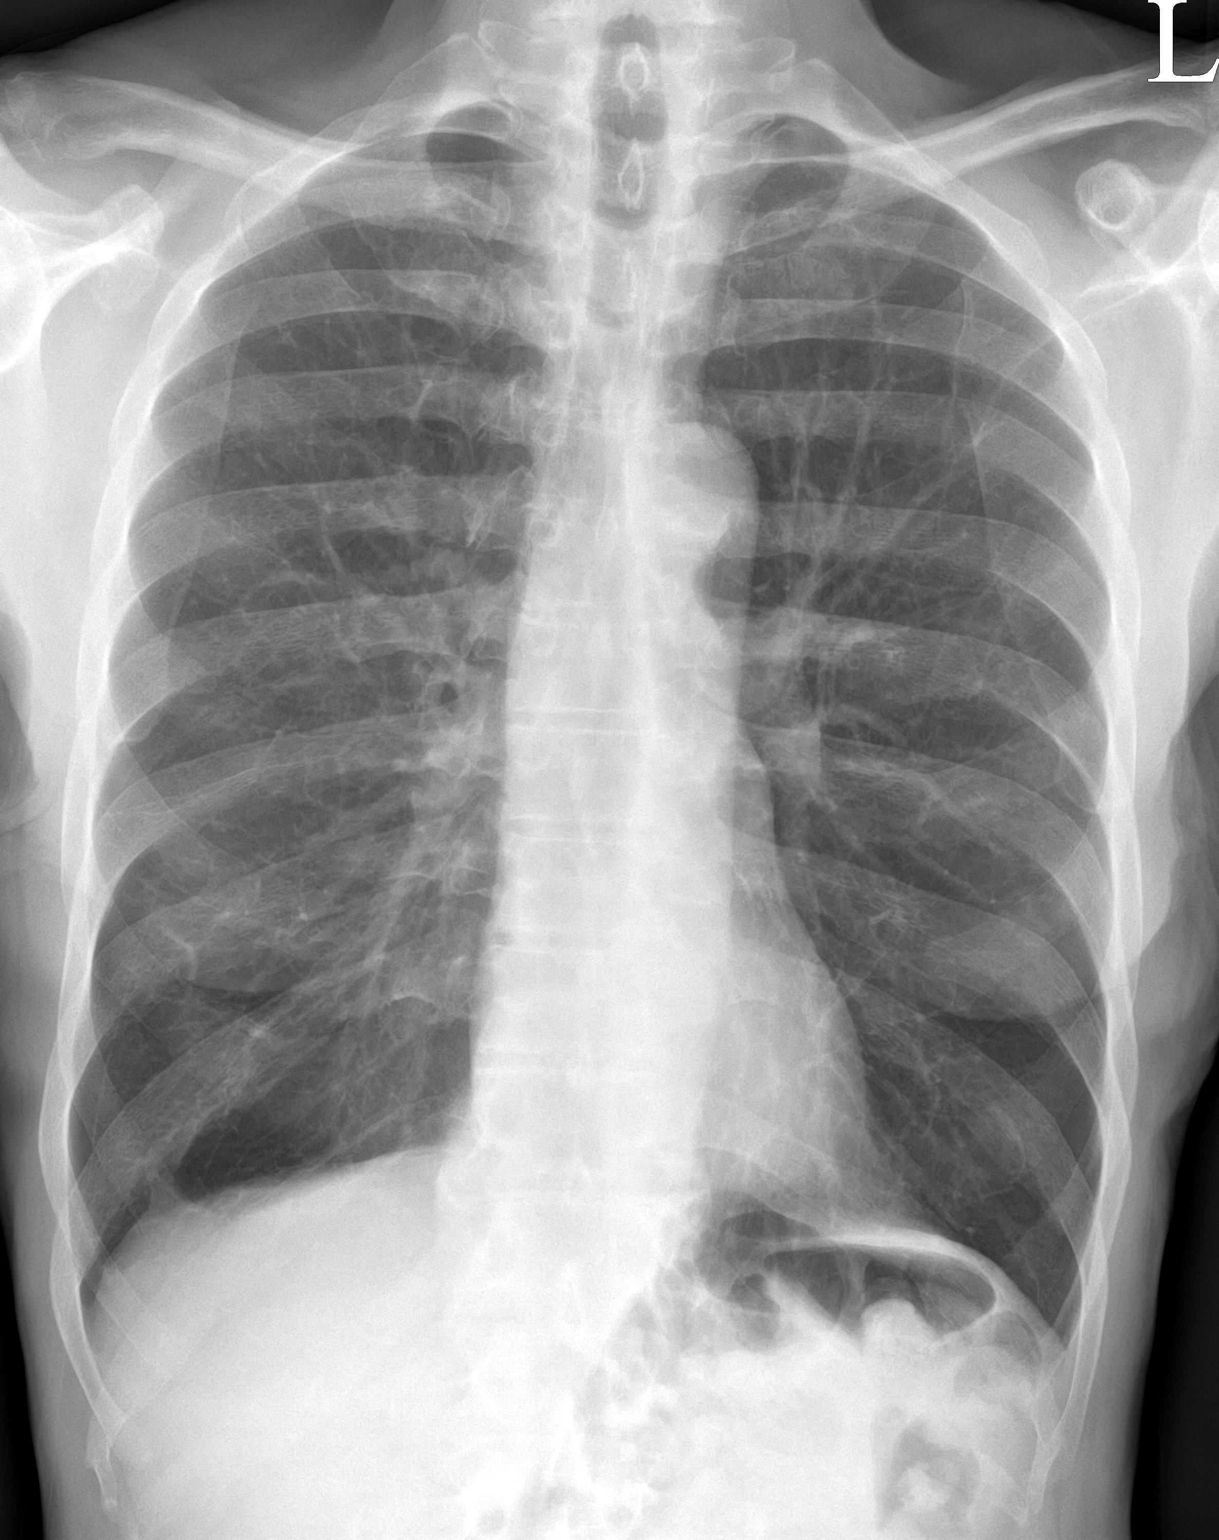

[chest lat]
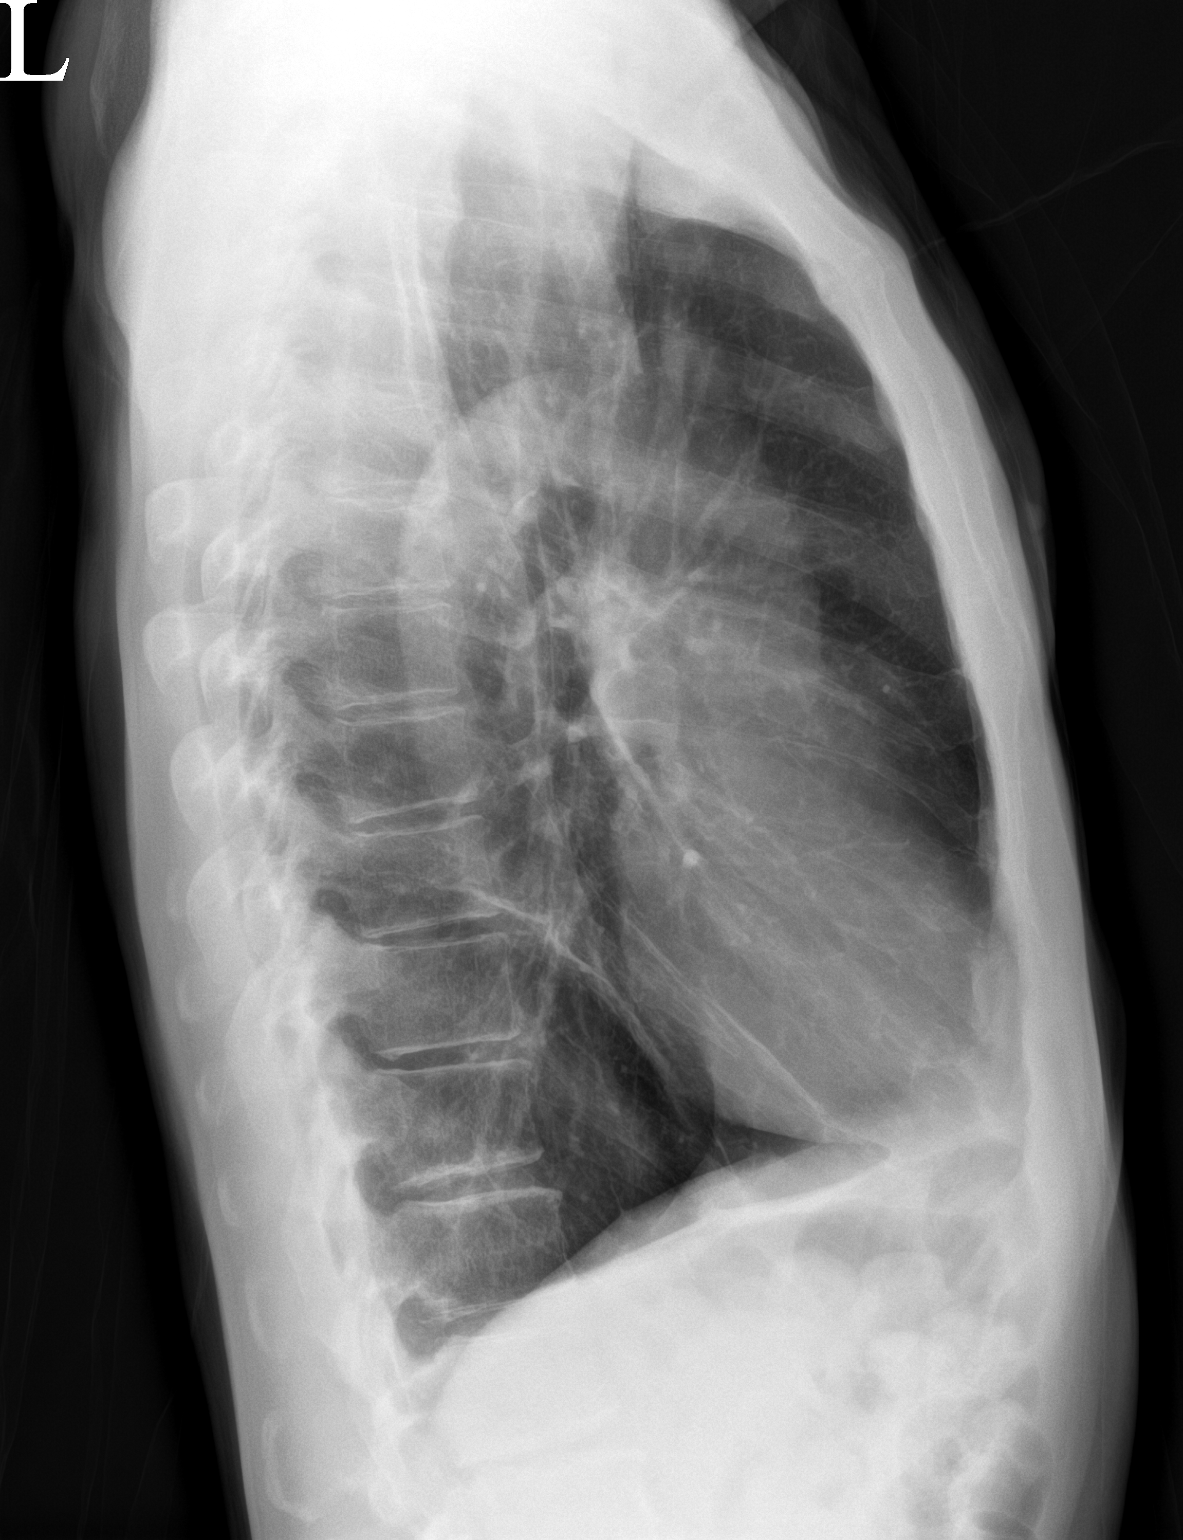

[2 of 2 positions shown; findings below may reference images not displayed]

FINDINGS: The heart size and mediastinal contours are within normal limits.
Both lungs are clear. Hyperinflation of the lungs is again noted.
The visualized skeletal structures are unremarkable.
IMPRESSION: No active cardiopulmonary disease.  Hyperinflation of the lungs.

## 2021-08-28 ENCOUNTER — Other Ambulatory Visit: Payer: Self-pay

## 2021-08-28 ENCOUNTER — Ambulatory Visit (INDEPENDENT_AMBULATORY_CARE_PROVIDER_SITE_OTHER): Payer: No Typology Code available for payment source | Admitting: Internal Medicine

## 2021-08-28 ENCOUNTER — Encounter: Payer: Self-pay | Admitting: Internal Medicine

## 2021-08-28 DIAGNOSIS — K5903 Drug induced constipation: Secondary | ICD-10-CM | POA: Diagnosis not present

## 2021-08-28 DIAGNOSIS — K5900 Constipation, unspecified: Secondary | ICD-10-CM | POA: Insufficient documentation

## 2021-08-28 DIAGNOSIS — I10 Essential (primary) hypertension: Secondary | ICD-10-CM | POA: Diagnosis not present

## 2021-08-28 DIAGNOSIS — G8929 Other chronic pain: Secondary | ICD-10-CM

## 2021-08-28 DIAGNOSIS — R29898 Other symptoms and signs involving the musculoskeletal system: Secondary | ICD-10-CM | POA: Diagnosis not present

## 2021-08-28 DIAGNOSIS — G20 Parkinson's disease: Secondary | ICD-10-CM | POA: Diagnosis not present

## 2021-08-28 DIAGNOSIS — M5440 Lumbago with sciatica, unspecified side: Secondary | ICD-10-CM

## 2021-08-28 LAB — TSH: TSH: 2.04 u[IU]/mL (ref 0.35–5.50)

## 2021-08-28 LAB — CBC WITH DIFFERENTIAL/PLATELET
Basophils Absolute: 0 10*3/uL (ref 0.0–0.1)
Basophils Relative: 0.4 % (ref 0.0–3.0)
Eosinophils Absolute: 0.2 10*3/uL (ref 0.0–0.7)
Eosinophils Relative: 3.6 % (ref 0.0–5.0)
HCT: 41.4 % (ref 39.0–52.0)
Hemoglobin: 13.7 g/dL (ref 13.0–17.0)
Lymphocytes Relative: 15.2 % (ref 12.0–46.0)
Lymphs Abs: 0.6 10*3/uL — ABNORMAL LOW (ref 0.7–4.0)
MCHC: 33 g/dL (ref 30.0–36.0)
MCV: 99.7 fl (ref 78.0–100.0)
Monocytes Absolute: 0.5 10*3/uL (ref 0.1–1.0)
Monocytes Relative: 11.5 % (ref 3.0–12.0)
Neutro Abs: 2.9 10*3/uL (ref 1.4–7.7)
Neutrophils Relative %: 69.3 % (ref 43.0–77.0)
Platelets: 195 10*3/uL (ref 150.0–400.0)
RBC: 4.16 Mil/uL — ABNORMAL LOW (ref 4.22–5.81)
RDW: 13.3 % (ref 11.5–15.5)
WBC: 4.2 10*3/uL (ref 4.0–10.5)

## 2021-08-28 LAB — COMPREHENSIVE METABOLIC PANEL
ALT: 19 U/L (ref 0–53)
AST: 20 U/L (ref 0–37)
Albumin: 4.3 g/dL (ref 3.5–5.2)
Alkaline Phosphatase: 56 U/L (ref 39–117)
BUN: 20 mg/dL (ref 6–23)
CO2: 28 mEq/L (ref 19–32)
Calcium: 9.7 mg/dL (ref 8.4–10.5)
Chloride: 103 mEq/L (ref 96–112)
Creatinine, Ser: 1.06 mg/dL (ref 0.40–1.50)
GFR: 69.32 mL/min (ref >60.00)
Glucose, Bld: 101 mg/dL — ABNORMAL HIGH (ref 70–99)
Potassium: 3.8 mEq/L (ref 3.5–5.1)
Sodium: 139 mEq/L (ref 135–145)
Total Bilirubin: 0.6 mg/dL (ref 0.2–1.2)
Total Protein: 7.3 g/dL (ref 6.0–8.3)

## 2021-08-28 NOTE — Assessment & Plan Note (Signed)
Pt thinks Sinemet is making it worse Use LOC

## 2021-08-28 NOTE — Assessment & Plan Note (Signed)
Dr Rolena Infante saw the pt 6 mo ago

## 2021-08-28 NOTE — Assessment & Plan Note (Addendum)
Using a cane F/u w/Dr Penumalli Pt refused PT

## 2021-08-28 NOTE — Assessment & Plan Note (Signed)
On NAS diet  

## 2021-08-28 NOTE — Assessment & Plan Note (Signed)
The patient be set up for nerve conduction studies on both legs and EMG on the right leg. On Sinemet. Dr Leta Baptist

## 2021-08-28 NOTE — Progress Notes (Signed)
Subjective:  Patient ID: Lee [name], male    DOB: 1946-02-13  Age: 76 y.o. MRN: [id number]  CC: Follow-up (3 month f/u)   HPI Lee [name] presents for gait disorder, tremors, probable Parkinson's - not better per pt. He is seeing Dr at the [facility]  Outpatient Medications Prior to Visit  Medication Sig Dispense Refill   albuterol (PROVENTIL HFA;VENTOLIN HFA) 108 (90 Base) MCG/ACT inhaler Inhale 2 puffs into the lungs every 4 (four) hours as needed for wheezing or shortness of breath. 1 Inhaler 0   BLACK PEPPER-TURMERIC PO Take by mouth daily. Mixes as a powder for back pain (ginger, cinnamon, black pepper, turmeric)     carbidopa-levodopa (SINEMET IR) 25-100 MG tablet Take 2 tablets by mouth 3 (three) times daily. 540 tablet 4   CINNAMON PO Mixes as a powder for back pain (ginger, cinnamon, black pepper, turmeric)     Ergocalciferol (VITAMIN D2) 50 MCG (2000 UT) TABS Take by mouth.     FEROSUL 325 (65 Fe) MG tablet Take by mouth.     fluticasone (FLONASE) 50 MCG/ACT nasal spray INSTILL 1 SPRAY IN EACH NOSTRIL TWICE A DAY FOR DIZZINESS     folic acid (FOLVITE) 1 MG tablet Take 1 mg by mouth daily.     Ginger, Zingiber officinalis, (GINGER PO) Mixes as a powder for back pain (ginger, cinnamon, black pepper, turmeric)     mirtazapine (REMERON) 7.5 MG tablet Take 1 tablet (7.5 mg total) by mouth at bedtime. 30 tablet 6   mometasone-formoterol (DULERA) 200-5 MCG/ACT AERO Inhale 2 puffs into the lungs in the morning and at bedtime. 3 each 2   montelukast (SINGULAIR) 10 MG tablet Take 1 tablet (10 mg total) by mouth at bedtime. 30 tablet 6   Multiple Vitamin (QUINTABS) TABS Take by mouth.     oxybutynin (DITROPAN-XL) 10 MG 24 hr tablet Take 10 mg by mouth at bedtime.     pantoprazole (PROTONIX) 40 MG tablet Take by mouth.     tamsulosin (FLOMAX) 0.4 MG CAPS capsule Take 0.4 mg by mouth.     Tiotropium Bromide Monohydrate 2.5 MCG/ACT AERS Inhale 2 puffs into the lungs daily.     vitamin C  (ASCORBIC ACID) 500 MG tablet Take by mouth.     finasteride (PROSCAR) 5 MG tablet Take 5 mg by mouth daily. (Patient not taking: Reported on 07/10/2021)     No facility-administered medications prior to visit.    ROS: Review of Systems  Constitutional:  Positive for fatigue. Negative for appetite change and unexpected weight change.  HENT:  Negative for congestion, nosebleeds, sneezing, sore throat and trouble swallowing.   Eyes:  Negative for itching and visual disturbance.  Respiratory:  Negative for cough.   Cardiovascular:  Negative for chest pain, palpitations and leg swelling.  Gastrointestinal:  Positive for constipation. Negative for abdominal distention, blood in stool, diarrhea and nausea.  Genitourinary:  Negative for frequency and hematuria.  Musculoskeletal:  Positive for gait problem. Negative for back pain, joint swelling and neck pain.  Skin:  Negative for rash.  Neurological:  Positive for tremors. Negative for dizziness, speech difficulty and weakness.  Psychiatric/Behavioral:  Negative for agitation, dysphoric mood, sleep disturbance and suicidal ideas. The patient is nervous/anxious.    Objective:  BP (!) 142/70 (BP Location: Left Arm)    Pulse (!) 101    Temp 98.3 F (36.8 C) (Oral)    Ht 5' 10.5" (1.791 m)    Wt 152 lb 6.4 oz (69.1  kg)    SpO2 97%    BMI 21.56 kg/m   BP Readings from Last 3 Encounters:  08/28/21 (!) 142/70  07/05/21 112/68  05/31/21 108/60    Wt Readings from Last 3 Encounters:  08/28/21 152 lb 6.4 oz (69.1 kg)  07/05/21 151 lb 3.2 oz (68.6 kg)  05/31/21 153 lb (69.4 kg)    Physical Exam Constitutional:      General: He is not in acute distress.    Appearance: He is well-developed.     Comments: NAD  Eyes:     Conjunctiva/sclera: Conjunctivae normal.     Pupils: Pupils are equal, round, and reactive to light.  Neck:     Thyroid: No thyromegaly.     Vascular: No JVD.  Cardiovascular:     Rate and Rhythm: Normal rate and regular  rhythm.     Heart sounds: Normal heart sounds. No murmur heard.   No friction rub. No gallop.  Pulmonary:     Effort: Pulmonary effort is normal. No respiratory distress.     Breath sounds: Normal breath sounds. No wheezing or rales.  Chest:     Chest wall: No tenderness.  Abdominal:     General: Bowel sounds are normal. There is no distension.     Palpations: Abdomen is soft. There is no mass.     Tenderness: There is no abdominal tenderness. There is no guarding or rebound.  Musculoskeletal:        General: No tenderness. Normal range of motion.     Cervical back: Normal range of motion.  Lymphadenopathy:     Cervical: No cervical adenopathy.  Skin:    General: Skin is warm and dry.     Findings: No rash.  Neurological:     Mental Status: He is alert and oriented to person, place, and time.     Cranial Nerves: No cranial nerve deficit.     Motor: Weakness present. No abnormal muscle tone.     Coordination: Coordination abnormal.     Gait: Gait abnormal.     Deep Tendon Reflexes: Reflexes are normal and symmetric.  Psychiatric:        Behavior: Behavior normal.        Thought Content: Thought content normal.        Judgment: Judgment normal.  Ataxic Using a cane Tremor of hands  Lab Results  Component Value Date   WBC 3.4 (L) 02/01/2021   HGB 13.6 02/01/2021   HCT 39.8 02/01/2021   PLT 184.0 02/01/2021   GLUCOSE 95 02/01/2021   CHOL 225 (H) 02/01/2021   TRIG 56.0 02/01/2021   HDL 88.10 02/01/2021   LDLCALC 126 (H) 02/01/2021   ALT 14 02/01/2021   AST 18 02/01/2021   NA 140 02/01/2021   K 3.7 02/01/2021   CL 104 02/01/2021   CREATININE 1.02 02/01/2021   BUN 8 02/01/2021   CO2 29 02/01/2021   TSH 1.61 02/01/2021   PSA 5.09 (H) 02/01/2021    No results found.  Assessment & Plan:   Problem List Items Addressed This Visit     Constipation    Pt thinks Sinemet is making it worse Use LOC      Relevant Orders   CBC with Differential/Platelet    Comprehensive metabolic panel   TSH   Essential hypertension    On NAS diet      LOW BACK PAIN    Dr Lee [name] saw the pt 6 mo ago  Parkinson's disease ([place])    The patient be set up for nerve conduction studies on both legs and EMG on the right leg. On Sinemet. Dr Leta Baptist      Relevant Orders   CBC with Differential/Platelet   Comprehensive metabolic panel   TSH   Weakness of both legs    Using a cane F/u w/Dr Penumalli Pt refused PT      Relevant Orders   CBC with Differential/Platelet   Comprehensive metabolic panel   TSH      No orders of the defined types were placed in this encounter.     Follow-up: Return in about 3 months (around 11/26/2021) for a follow-up visit.  Walker Kehr, MD

## 2021-10-09 ENCOUNTER — Ambulatory Visit (INDEPENDENT_AMBULATORY_CARE_PROVIDER_SITE_OTHER): Payer: No Typology Code available for payment source

## 2021-10-09 DIAGNOSIS — G20 Parkinson's disease: Secondary | ICD-10-CM

## 2021-10-09 DIAGNOSIS — N401 Enlarged prostate with lower urinary tract symptoms: Secondary | ICD-10-CM

## 2021-10-09 NOTE — Patient Instructions (Signed)
Visit Information ? ?Following are the goals we discussed today:  ? ?Manage My Medicine  ? ?Timeframe:  Long-Range Goal ?Priority:  High ?Start Date:   05/10/2021                          ?Expected End Date:  07/10/2022                    ? ?Follow Up Date 03/2022 ?  ?- call if I am sick and can't take my medicine ?- keep a list of all the medicines I take; vitamins and herbals too ?- learn to read medicine labels  ?  ?Why is this important?   ?These steps will help you keep on track with your medicines ? ?Plan: Telephone follow up appointment with care management team member scheduled for:  6 months  ?The patient has been provided with contact information for the care management team and has been advised to call with any health related questions or concerns.  ? ?[name], PharmD ?Clinical Pharmacist, [place]  ? ?Please call the care guide team at [phone] if you need to cancel or reschedule your appointment.  ? ?Patient verbalizes understanding of instructions and care plan provided today and agrees to view in [place]. Active MyChart status confirmed with patient.   ? ?

## 2021-10-09 NOTE — Progress Notes (Cosign Needed)
Chronic Care Management Pharmacy Note  10/09/2021 Name:  Lee [name] MRN:  [id number] DOB:  [birth year]  Summary: -Patient reports that he stopped carbidopa-levodopa and mirtazapine about 3-4 weeks ago as he did not feel the medication were helpful  - did not communicate stopping medications with his neurology providers - denies any changes since stopping  -Reports that he has been compliant with his other medications - denies any new issues or concerns   Recommendations/Changes made from today's visit: -Recommending no changes to medications at this time - advised for patient to make neurology aware that he has stopped carbidopa-levodopa and mirtazapine - recommended to make earlier follow up to discuss options  Subjective: Lee [name] is an 76 y.o. year old male who is a primary patient of [name], [name] Lacks, MD.  The CCM team was consulted for assistance with disease management and care coordination needs.    Engaged with patient by telephone for follow up visit in response to provider referral for pharmacy case management and/or care coordination services.   Consent to Services:  The patient was given the following information about Chronic Care Management services today, agreed to services, and gave verbal consent: 1. CCM service includes personalized support from designated clinical staff supervised by the primary care provider, including individualized plan of care and coordination with other care providers 2. 24/7 contact phone numbers for assistance for urgent and routine care needs. 3. Service will only be billed when office clinical staff spend 20 minutes or more in a month to coordinate care. 4. Only one practitioner may furnish and bill the service in a calendar month. 5.The patient may stop CCM services at any time (effective at the end of the month) by phone call to the office staff. 6. The patient will be responsible for cost sharing (co-pay) of up to 20% of the  service fee (after annual deductible is met). Patient agreed to services and consent obtained.  Patient Care Team: [name], [name] Lacks, MD as PCP - General (Internal Medicine) Armour, Azalia Bilis, MD as Referring Physician (Internal Medicine) Rush Farmer, MD (Pulmonary Disease) Joelene Millin PA-C as Physician Assistant (Orthopedic Surgery) Webb Laws, [place] as Referring Physician (Optometry) Emilygrace Grothe, Darnelle Maffucci, [facility] as Pharmacist (Pharmacist) Kathrynn Ducking, MD (Inactive) as Consulting Physician (Neurology) Melina Schools, MD as Consulting Physician (Orthopedic Surgery) Penni Bombard, MD as Consulting Physician (Neurology)  Recent office visits:  08/28/2021 - Dr. Alain Marion - no changes to medications - f/u in 3 months    Recent consult visits:  10/06/2021 - Dr. Dara Lords - note not available  09/12/2021 - Dr. Valeta Harms - note not available  08/31/2021 - Dr. Landry Mellow - note not available  08/23/2021 - Dr[facility] D[facility] Lords [facility] - right hip pain - orders placed for acupuncture - declined injections at this time - f/u in 2 months  08/03/2021 - Dr. Landry Mellow-  note not available  07/26/2021 - Dr. Anderson Malta Page - note not available  07/13/2021 - Dr. Landry Mellow - note not available    Hospital visits:  None since last visit   Objective:  Lab Results  Component Value Date   CREATININE 1.06 08/28/2021   BUN 20 08/28/2021   GFR 69.32 08/28/2021   GFRNONAA >60 01/05/2017   GFRAA >60 01/05/2017   NA 139 08/28/2021   K 3.8 08/28/2021   CALCIUM 9.7 08/28/2021   CO2 28 08/28/2021   GLUCOSE 101 (H) 08/28/2021    Lab Results  Component Value Date/Time   GFR 69.32 08/28/2021 11:21 AM   GFR 72.89 02/01/2021 03:29 PM    Last diabetic Eye exam:  No results found for: HMDIABEYEEXA  Last diabetic Foot exam:  No results found for: HMDIABFOOTEX   Lab Results  Component Value Date   CHOL 225 (H) 02/01/2021   HDL 88.10 02/01/2021    LDLCALC 126 (H) 02/01/2021   TRIG 56.0 02/01/2021   CHOLHDL 3 02/01/2021    Hepatic Function Latest Ref Rng & Units 08/28/2021 02/01/2021  Total Protein 6.0 - 8.3 g/dL 7.3 7.0  Albumin 3.5 - 5.2 g/dL 4.3 4.4  AST 0 - 37 U/L 20 18  ALT 0 - 53 U/L 19 14  Alk Phosphatase 39 - 117 U/L 56 53  Total Bilirubin 0.2 - 1.2 mg/dL 0.6 0.8    Lab Results  Component Value Date/Time   TSH 2.04 08/28/2021 11:21 AM   TSH 1.61 02/01/2021 03:29 PM   FREET4 0.86 02/01/2021 03:29 PM    CBC Latest Ref Rng & Units 08/28/2021 02/01/2021 01/05/2017  WBC 4.0 - 10.5 K/uL 4.2 3.4(L) 6.5  Hemoglobin 13.0 - 17.0 g/dL 13.7 13.6 13.9  Hematocrit 39.0 - 52.0 % 41.4 39.8 40.5  Platelets 150.0 - 400.0 K/uL 195.0 184.0 199    Lab Results  Component Value Date/Time   VD25OH 57.33 02/01/2021 03:29 PM    Clinical ASCVD: No  The 10-year ASCVD risk score (Arnett DK, et al., 2019) is: 21.7%*   Values used to calculate the score:     Age: 72 years     Sex: Male     Is Non-Hispanic African American: Yes     Diabetic: No     Tobacco smoker: Yes     Systolic Blood Pressure: 272 mmHg     Is BP treated: No     HDL Cholesterol: 99 mg/dL*     Total Cholesterol: 204 mg/dL*     * - Cholesterol units were assumed for this score calculation    Depression screen [facility] 2/9 03/23/2021 02/01/2021 09/21/2019  Decreased Interest 0 0 0  Down, Depressed, Hopeless 0 0 0  PHQ - 2 Score 0 0 0  Altered sleeping 0 0 0  Tired, decreased energy 0 0 0  Change in appetite 0 0 0  Feeling bad or failure about yourself  0 0 0  Trouble concentrating 0 0 0  Moving slowly or fidgety/restless 0 0 0  Suicidal thoughts 0 0 0  PHQ-9 Score 0 0 0  Difficult doing work/chores - - Not difficult at all     Social History   Tobacco Use  Smoking Status Former   Types: Cigarettes   Quit date: 06/09/2005   Years since quitting: 16.3  Smokeless Tobacco Never   BP Readings from Last 3 Encounters:  08/28/21 (!) 142/70  07/05/21 112/68  05/31/21  108/60   Pulse Readings from Last 3 Encounters:  08/28/21 (!) 101  07/05/21 88  05/31/21 90   Wt Readings from Last 3 Encounters:  08/28/21 152 lb 6.4 oz (69.1 kg)  07/05/21 151 lb 3.2 oz (68.6 kg)  05/31/21 153 lb (69.4 kg)   BMI Readings from Last 3 Encounters:  08/28/21 21.56 kg/m  07/05/21 21.39 kg/m  05/31/21 20.95 kg/m    Assessment/Interventions: Review of patient past medical history, allergies, medications, health status, including review of consultants reports, laboratory and other test data, was performed as part of comprehensive evaluation and provision of chronic care management services.  SDOH:  (Social Determinants of Health) assessments and interventions performed: Yes  SDOH Screenings   Alcohol Screen: Low Risk    Last Alcohol Screening Score (AUDIT): 0  Depression (PHQ2-9): Low Risk    PHQ-2 Score: 0  Financial Resource Strain: Low Risk    Difficulty of Paying Living Expenses: Not hard at all  Food Insecurity: No Food Insecurity   Worried About Charity fundraiser in the Last Year: Never true   Ran Out of Food in the Last Year: Never true  Housing: Low Risk    Last Housing Risk Score: 0  Physical Activity: Inactive   Days of Exercise per Week: 0 days   Minutes of Exercise per Session: 0 min  Social Connections: Moderately Integrated   Frequency of Communication with Friends and Family: More than three times a week   Frequency of Social Gatherings with Friends and Family: Once a week   Attends Religious Services: More than 4 times per year   Active Member of [employer] or Organizations: Yes   Attends Music therapist: More than 4 times per year   Marital Status: Divorced  Stress: No Stress Concern Present   Feeling of Stress : Not at all  Tobacco Use: Medium Risk   Smoking Tobacco Use: Former   Smokeless Tobacco Use: Never   Passive Exposure: Not on Pensions consultant Needs: No Transportation Needs   Lack of Transportation (Medical):  No   Lack of Transportation (Non-Medical): No    CCM Care Plan  Allergies  Allergen Reactions   Acyclovir Swelling   Enalapril Maleate Other (See Comments)    Reaction:  Unknown     Medications Reviewed Today     Reviewed by [name], [facility] (Pharmacist) on 10/09/21 at 1313  Med List Status: <None>   Medication Order Taking? Sig Documenting Provider Last Dose Status Informant  albuterol (PROVENTIL HFA;VENTOLIN HFA) 108 (90 Base) MCG/ACT inhaler [id number] Yes Inhale 2 puffs into the lungs every 4 (four) hours as needed for wheezing or shortness of breath. Delora Fuel, MD Taking Active   BLACK [facility] PO [id number] Yes Take by mouth daily. Mixes as a powder for back pain (ginger, cinnamon, black pepper, turmeric) [provider] Taking Active   carbidopa-levodopa (SINEMET IR) 25-100 MG tablet [id number] No Take 2 tablets by mouth 3 (three) times daily.  Patient not taking: Reported on 10/09/2021   Penni Bombard, MD Not Taking Active   CINNAMON PO [id number] Yes Mixes as a powder for back pain (ginger, cinnamon, black pepper, turmeric) [provider] Taking Active   Ergocalciferol (VITAMIN D2) 50 MCG (2000 UT) TABS [id number] Yes Take by mouth. [provider] Taking Active   FEROSUL 325 (65 Fe) MG tablet [id number] Yes Take by mouth. [provider] Taking Active   fluticasone (FLONASE) 50 MCG/ACT nasal spray [id number] Yes INSTILL 1 SPRAY IN EACH NOSTRIL TWICE A DAY FOR DIZZINESS [provider] Taking Active   folic acid (FOLVITE) 1 MG tablet [id number] Yes Take 1 mg by mouth daily. [provider] Taking Active Self  Ginger, Zingiber officinalis, (GINGER PO) [id number] Yes Mixes as a powder for back pain (ginger, cinnamon, black pepper, turmeric) [provider] Taking Active   mirtazapine (REMERON) 7.5 MG tablet [id number] No Take 1 tablet (7.5 mg total) by mouth at bedtime.  Patient not taking: Reported on  10/09/2021   Penni Bombard, MD Not Taking Active   mometasone-formoterol [facility]) 200-5 MCG/ACT AERO [id number] Yes Inhale  2 puffs into the lungs in the morning and at bedtime. Margaretha Seeds, MD Taking Active   montelukast (SINGULAIR) 10 MG tablet [id number] No Take 1 tablet (10 mg total) by mouth at bedtime.  Patient not taking: Reported on 10/09/2021   Margaretha Seeds, MD Not Taking Active   Multiple Vitamin [facility] Fl[facility]) TABS [id number] Yes Take by mouth. [provider] Taking Active   OVER THE [place] [id number] Yes NeuroQ daily [provider] Taking Active   oxybutynin (DITROPAN-XL) 10 MG 24 hr tablet [id number] Yes Take 10 mg by mouth at bedtime. [provider] Taking Active   pantoprazole (PROTONIX) 40 MG tablet [id number] Yes Take by mouth. [provider] Taking Active   tamsulosin (FLOMAX) 0.4 MG CAPS capsule [id number] Yes Take 0.4 mg by mouth. [provider] Taking Active   Tiotropium Bromide Monohydrate 2.5 MCG/ACT AERS [id number] Yes Inhale 2 puffs into the lungs daily. [provider] Taking Active Self  vitamin C (ASCORBIC ACID) 500 MG tablet [id number] Yes Take by mouth. [provider] Taking Active   Med List Note Corena Herter 01/05/17 6803): Pt also uses the [place] in [place], [place].              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   Emphysema of lung ([place]) 06/04/2019   Spondylolisthesis at L4-L5 level 08/27/2017   Spinal stenosis in cervical region 08/27/2017   Tremor  06/02/2015   Cervical spondylosis without myelopathy 06/02/2015   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   HIP PAIN 11/27/2007   Essential hypertension 07/25/2007   Atrial fibrillation ([place]) 07/25/2007   GERD 07/25/2007   ERECTILE DYSFUNCTION 07/25/2007   LOW BACK PAIN 07/25/2007    Immunization History  Administered Date(s) Administered   Fluad Quad(high Dose 65+) 04/29/2020, 05/10/2020, 05/02/2021   Influenza Split 05/31/2015   Influenza Whole 07/25/2007   Influenza, High Dose Seasonal PF 03/30/2013, 04/15/2013, 04/29/2017, 04/23/2018, 04/27/2019, 05/21/2019   Influenza,inj,Quad PF,6+ Mos 05/28/2016   Influenza-Unspecified 05/31/2007, 06/18/2008, 05/16/2009, 04/11/2010, 04/29/2010, 07/27/2011, 05/15/2012, 05/12/2014   Moderna Covid-19 Vaccine Bivalent Booster 58yr & up 07/26/2021   Moderna Sars-Covid-2 Vaccination 08/25/2019, 09/22/2019, 06/24/2020, 12/08/2020   Pneumococcal Conjugate-13 06/29/2014   Pneumococcal Polysaccharide-23 05/28/2013   Pneumococcal-Unspecified 05/06/2007   [place], IM 04/20/2020   Td,absorbed, Preservative Free, Adult Use, Lf Unspecified 04/17/2021   Tdap 09/07/2011   Zoster Recombinat (Shingrix) 12/11/2019, 02/16/2020    Conditions to be addressed/monitored:  BPH and Parkinson's Disease  Care Plan : CCM Care Plan  Updates made by STomasa Blase RPH since 10/09/2021 12:00 AM     Problem: BPH, Parkinson's Disease, Emphysema   Priority: High  Onset Date: 05/10/2021     Long-Range Goal: Disease Management   Start Date: 05/10/2021  Expected End Date: 07/10/2022  This Visit's Progress: Not on track  Recent Progress: On track  Priority: High  Note:   Current Barriers:  Unable to independently monitor therapeutic efficacy  Pharmacist Clinical Goal(s):  Patient will achieve adherence to monitoring guidelines and medication adherence to achieve therapeutic efficacy through collaboration with PharmD and provider.  Interventions: 1:1  collaboration with [name], [name] Lacks, MD regarding development and update of comprehensive plan of care as evidenced by provider attestation and co-signature Inter-disciplinary care team collaboration (see longitudinal plan of care) Comprehensive medication review performed; medication list updated in electronic medical record  Parkinson's Disease  (Goal: prevention of disease progression/ management of symptoms) - [facility]  -Not ideally controlled - stopped mirtazapine and carbidopa-levodopa due to feeling they were ineffective at controlling symptoms  -Current treatment  Mirtazapine 7.146m - 1 tablet daily - not currently taking  Carbidopa-Levodopa 25-1073m- 2 tablets 3 times daily - not currently taking  -Medications previously tried: gabapentin  -Recommended to follow up with neurology to discuss options   Emphysema(Goal: control symptoms and prevent exacerbations) -Controlled -Current treatment  Dulera 200-46m41mact - 2 puffs twice daily  Spiriva Respimat 2.46mc10mct - 2 puffs daily in AM Albuterol HFA 108mc146mt - 2 puffs every 6 hours as needed  Montelukast 10mg 31mtablet daily  -Medications previously tried: n/a  -Exacerbations requiring treatment in last 6 months: 0 -Patient reports consistent use of maintenance inhaler -Frequency of rescue inhaler use: once a day - at most twice daily  -Counseled on Benefits of consistent maintenance inhaler use When to use rescue inhaler Differences between maintenance and rescue inhalers -Recommended to continue current medication, reach out to pulmonology should breathing become harder / issues with SOB, DOE, or cough    BPH (Goal: prevention of disease progression/ management of urinary symptoms) -Controlled - [facility]  Lab Results  Component Value Date   PSA 5.09 (H) 02/01/2021  -Current treatment  Oxybutynin XL 10mg -51mablet daily  Tamsulosin 0.4mg - 129mpsule daily  -Medications previously  tried: n/a  -Recommended to continue current medication  Health Maintenance -Vaccine gaps: COVID booster -Current therapy:  Fluticasone 50mcg/ac32m1 spray into each nostril twice daily  Vitamin C 500mg - 1 27met daily  Multivitamin - 1 tablet daily  Pantoprazole 40mg - 1 t77mt daily  Vitamin D2 2000units - 1 tablet daily  Folic Acid 1mg - 1 tab34m daily  Ginger - Mixes as a powder for back pain (ginger, cinnamon, black pepper, turmeric) Turmeric+ Black Pepper - Mixes as a powder for back pain (ginger, cinnamon, black pepper, turmeric) Cinnamon - Mixes as a powder for back pain (ginger, cinnamon, black pepper, turmeric) Ferrous Sulfate 3246mg - 1 tab64mdaily  Neuroq -Educated on Herbal supplement research is limited and benefits usually cannot be proven Cost vs benefit of each product must be carefully weighed by individual consumer Supplements may interfere with prescription drugs -Patient is satisfied with current therapy and denies issues -Recommended to continue current medication  Patient Goals/Self-Care Activities Patient will:  - take medications as prescribed  Follow Up Plan: Telephone follow up appointment with care management team member scheduled for: 3 months The patient has been provided with contact information for the care management team and has been advised to call with any health related questions or concerns.       Medication Assistance: None required.  Patient affirms current coverage meets needs.   Care Gaps: Hep C screening Colonoscopy COVID booster  [phone] box? No - feels he is able to manage without  Pt endorses 100% compliance  Care Plan and Follow Up Patient  Decision:  Patient agrees to Care Plan and Follow-up.  Plan:  Telephone follow up appointment with care management team member scheduled for:  6-8 months, The patient has been provided with contact information for the care management team and has been advised to call with any health related questions or concerns.  [name], PharmD Clinical Pharmacist, [place]

## 2021-10-27 DIAGNOSIS — R350 Frequency of micturition: Secondary | ICD-10-CM

## 2021-10-27 DIAGNOSIS — N401 Enlarged prostate with lower urinary tract symptoms: Secondary | ICD-10-CM | POA: Diagnosis not present

## 2021-11-21 ENCOUNTER — Telehealth: Payer: Self-pay | Admitting: Occupational Therapy

## 2021-11-21 ENCOUNTER — Ambulatory Visit: Payer: No Typology Code available for payment source

## 2021-11-21 ENCOUNTER — Ambulatory Visit: Payer: No Typology Code available for payment source | Attending: Internal Medicine | Admitting: Physical Therapy

## 2021-11-21 ENCOUNTER — Ambulatory Visit: Payer: No Typology Code available for payment source | Admitting: Occupational Therapy

## 2021-11-21 DIAGNOSIS — R1310 Dysphagia, unspecified: Secondary | ICD-10-CM | POA: Insufficient documentation

## 2021-11-21 DIAGNOSIS — R498 Other voice and resonance disorders: Secondary | ICD-10-CM

## 2021-11-21 DIAGNOSIS — R2689 Other abnormalities of gait and mobility: Secondary | ICD-10-CM

## 2021-11-21 DIAGNOSIS — R41841 Cognitive communication deficit: Secondary | ICD-10-CM

## 2021-11-21 DIAGNOSIS — Z7409 Other reduced mobility: Secondary | ICD-10-CM

## 2021-11-21 DIAGNOSIS — R1319 Other dysphagia: Secondary | ICD-10-CM

## 2021-11-21 DIAGNOSIS — R2681 Unsteadiness on feet: Secondary | ICD-10-CM | POA: Insufficient documentation

## 2021-11-21 DIAGNOSIS — R29818 Other symptoms and signs involving the nervous system: Secondary | ICD-10-CM

## 2021-11-21 DIAGNOSIS — R471 Dysarthria and anarthria: Secondary | ICD-10-CM

## 2021-11-21 DIAGNOSIS — Z789 Other specified health status: Secondary | ICD-10-CM

## 2021-11-21 NOTE — Therapy (Signed)
[place] ?[place] ?[place], [place], [address] ?Phone: [phone]   Fax:  [phone] ? ?Patient Details  ?Name: Lee [name] ?MRN: [id number] ?Date of Birth: [birth year] ?Referring Provider:  Cassandria Anger, MD ? ?Encounter Date: 11/21/2021 ? ?Occupational Therapy Parkinson's Disease Screen ? ?Hand dominance:  right  ? ?Physical Performance Test item #4 (donning/doffing jacket):  29.97 sec ? ?Fastening/unfastening 3 buttons in:  80 sec ? ?Change in ability to perform ADLs/IADLs:  pt reports increased difficulty with ADLs and that he fatigues quickly with ADLs  ? ?Pt would benefit from occupational therapy evaluation due to  difficulty with ADLs.   ? ? ? ? Vianne Bulls, [place] ?11/21/2021, 1:55 PM ? ?[place] ?[place] ?[place], [place], [address] ?Phone: [phone]   Fax:  [phone] ? ?Vianne Bulls, OTR/L ?[place] ?[place], [place]  [address] ?[phone] phone ?[phone] ?11/21/21 1:55 PM ? ? ?

## 2021-11-21 NOTE — Therapy (Signed)
[place] ?[place] ?[place], [place], [address] ?Phone: [phone]   Fax:  [phone] ? ?Patient Details  ?Name: Lee [name] ?MRN: [id number] ?Date of Birth: May 30, 1946 ?Referring Provider:  Cassandria Anger, MD ? ?Encounter Date: 11/21/2021 ? ?Physical Therapy Parkinson's Disease Screen ? ? ?Timed Up and Go test:16.88 seconds with SPC (previously 7.65 seconds) ? ?10 meter walk test:17.81 seconds with cane = 1.84 ft/sec (previously 3.66 ft/sec with cane) - pt needing min guard for safety.  ? ?5 time sit to stand test: 37.6 seconds (needs to rock to stand up) - previously 12.6 seconds ? ?Reports his stability has not been good. Has been falling. Has gotten a lot worse since he was last here for PT. Has been doing his exercises. Gets tired a lot more easily. R leg is still giving him the most problems.  ? ?Patient would benefit from Physical Therapy evaluation due to significant slowing of outcome measures and pt having more falls. Will need to send referral request to the [facility] [place].  ? ? ? ? ?Arliss Journey, PT, DPT  ?11/21/2021, 1:15 PM ? ?[place] ?[place] ?[place], [place], [address] ?Phone: [phone]   Fax:  [phone] ?

## 2021-11-21 NOTE — Patient Instructions (Addendum)
? ? ? ?  Lee [name] was seen for Parkinsons OT, PT, and ST screens 11/21/21.   Pt may benefit from OT referral (due to decline/difficulty with ADLs), speech therapy referral (due to voice volume, changes in cognition, and swallowing), and PT referral (due to decline balance/mobility).   If you are in agreement, please send updated order for Speech therapy, Occupational therapy, and Physical therapy via epic. ? ?Thank you, ? ? ?Vianne Bulls, OTR/L ?[place] ?[place], [place]  [address] ?[phone] phone ?[phone] ?11/21/21 2:11 PM ? ? ? ? ?

## 2021-11-21 NOTE — Therapy (Signed)
[place] ?[place] ?[place], [place], [address] ?Phone: [phone]   Fax:  [phone] ? ?Patient Details  ?Name: Lee [name] ?MRN: [id number] ?Date of Birth: 13-Jun-1946 ?Referring Provider: Landry Mellow, MD ([place]) ? ?Encounter Date: 11/21/2021 ? ?Speech Therapy Parkinson's Disease Screen  ? ?Decibel Level today: mid 60s dB  (WNL=70-72 dB) with sound level meter 30cm away from pt's mouth. Pt's conversational volume has reportedly declined in last several months to year. Intelligibility reduced to ~85% in quiet environment. Some stuttering also reported.  ? ?Pt reported changes in cognition, including short term recall (such as names), as well as difficulty with swallowing warranting further evaluation. 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 Indicated trouble with both solids and liquids requiring careful use of compensations (neutral head positioning, smaller bites/sips) to avoid choking.  ? ?Pt would benefit from modified barium swallow to objectively evaluate swallow as well as dysarthria/cognitive evaluation - please order via EPIC or call [phone] to schedule ? ?Marzetta Board, CCC-SLP ?11/21/2021, 1:06 PM ? ?[place] ?[place] ?[place], [place], [address] ?Phone: [phone]   Fax:  [phone] ?

## 2021-11-21 NOTE — Telephone Encounter (Signed)
    Lee [name] was seen for Parkinsons OT, PT, and ST screens 11/21/21.   Pt may benefit from OT referral (due to decline/difficulty with ADLs), speech therapy referral (due to voice volume, changes in cognition, and swallowing), and PT referral (due to decline balance/mobility).   If you are in agreement, please send updated order for Speech therapy, Occupational therapy, and Physical therapy via epic.   Thank you,     Vianne Bulls, OTR/L [facility] [address] [place], [place]  [address] [phone] phone [phone] 11/21/21 2:11 PM

## 2021-11-27 ENCOUNTER — Encounter: Payer: Self-pay | Admitting: Internal Medicine

## 2021-11-27 ENCOUNTER — Ambulatory Visit (INDEPENDENT_AMBULATORY_CARE_PROVIDER_SITE_OTHER): Payer: No Typology Code available for payment source | Admitting: Internal Medicine

## 2021-11-27 DIAGNOSIS — M25519 Pain in unspecified shoulder: Secondary | ICD-10-CM | POA: Insufficient documentation

## 2021-11-27 DIAGNOSIS — K030 Excessive attrition of teeth: Secondary | ICD-10-CM | POA: Insufficient documentation

## 2021-11-27 DIAGNOSIS — M7061 Trochanteric bursitis, right hip: Secondary | ICD-10-CM | POA: Insufficient documentation

## 2021-11-27 DIAGNOSIS — Z8042 Family history of malignant neoplasm of prostate: Secondary | ICD-10-CM | POA: Insufficient documentation

## 2021-11-27 DIAGNOSIS — R251 Tremor, unspecified: Secondary | ICD-10-CM

## 2021-11-27 DIAGNOSIS — M1992 Post-traumatic osteoarthritis, unspecified site: Secondary | ICD-10-CM | POA: Insufficient documentation

## 2021-11-27 DIAGNOSIS — Z7409 Other reduced mobility: Secondary | ICD-10-CM | POA: Insufficient documentation

## 2021-11-27 DIAGNOSIS — F4312 Post-traumatic stress disorder, chronic: Secondary | ICD-10-CM | POA: Insufficient documentation

## 2021-11-27 DIAGNOSIS — F329 Major depressive disorder, single episode, unspecified: Secondary | ICD-10-CM | POA: Diagnosis not present

## 2021-11-27 DIAGNOSIS — W19XXXA Unspecified fall, initial encounter: Secondary | ICD-10-CM | POA: Insufficient documentation

## 2021-11-27 DIAGNOSIS — H9319 Tinnitus, unspecified ear: Secondary | ICD-10-CM | POA: Insufficient documentation

## 2021-11-27 DIAGNOSIS — M2060 Acquired deformities of toe(s), unspecified, unspecified foot: Secondary | ICD-10-CM | POA: Insufficient documentation

## 2021-11-27 DIAGNOSIS — R209 Unspecified disturbances of skin sensation: Secondary | ICD-10-CM | POA: Insufficient documentation

## 2021-11-27 DIAGNOSIS — E785 Hyperlipidemia, unspecified: Secondary | ICD-10-CM | POA: Insufficient documentation

## 2021-11-27 DIAGNOSIS — G20 Parkinson's disease: Secondary | ICD-10-CM | POA: Diagnosis not present

## 2021-11-27 DIAGNOSIS — F331 Major depressive disorder, recurrent, moderate: Secondary | ICD-10-CM | POA: Insufficient documentation

## 2021-11-27 DIAGNOSIS — H26493 Other secondary cataract, bilateral: Secondary | ICD-10-CM | POA: Insufficient documentation

## 2021-11-27 DIAGNOSIS — E559 Vitamin D deficiency, unspecified: Secondary | ICD-10-CM | POA: Insufficient documentation

## 2021-11-27 DIAGNOSIS — D1039 Benign neoplasm of other parts of mouth: Secondary | ICD-10-CM | POA: Insufficient documentation

## 2021-11-27 DIAGNOSIS — K594 Anal spasm: Secondary | ICD-10-CM | POA: Insufficient documentation

## 2021-11-27 DIAGNOSIS — R531 Weakness: Secondary | ICD-10-CM | POA: Insufficient documentation

## 2021-11-27 DIAGNOSIS — Z7689 Persons encountering health services in other specified circumstances: Secondary | ICD-10-CM | POA: Insufficient documentation

## 2021-11-27 DIAGNOSIS — L84 Corns and callosities: Secondary | ICD-10-CM | POA: Insufficient documentation

## 2021-11-27 DIAGNOSIS — M25561 Pain in right knee: Secondary | ICD-10-CM | POA: Insufficient documentation

## 2021-11-27 DIAGNOSIS — D509 Iron deficiency anemia, unspecified: Secondary | ICD-10-CM | POA: Insufficient documentation

## 2021-11-27 DIAGNOSIS — H40022 Open angle with borderline findings, high risk, left eye: Secondary | ICD-10-CM | POA: Insufficient documentation

## 2021-11-27 DIAGNOSIS — R7309 Other abnormal glucose: Secondary | ICD-10-CM | POA: Insufficient documentation

## 2021-11-27 DIAGNOSIS — R1031 Right lower quadrant pain: Secondary | ICD-10-CM | POA: Insufficient documentation

## 2021-11-27 DIAGNOSIS — Z809 Family history of malignant neoplasm, unspecified: Secondary | ICD-10-CM | POA: Insufficient documentation

## 2021-11-27 DIAGNOSIS — M79651 Pain in right thigh: Secondary | ICD-10-CM | POA: Insufficient documentation

## 2021-11-27 DIAGNOSIS — L603 Nail dystrophy: Secondary | ICD-10-CM | POA: Insufficient documentation

## 2021-11-27 DIAGNOSIS — M791 Myalgia, unspecified site: Secondary | ICD-10-CM | POA: Insufficient documentation

## 2021-11-27 DIAGNOSIS — M542 Cervicalgia: Secondary | ICD-10-CM | POA: Insufficient documentation

## 2021-11-27 DIAGNOSIS — J45909 Unspecified asthma, uncomplicated: Secondary | ICD-10-CM | POA: Insufficient documentation

## 2021-11-27 DIAGNOSIS — R972 Elevated prostate specific antigen [PSA]: Secondary | ICD-10-CM | POA: Insufficient documentation

## 2021-11-27 DIAGNOSIS — M1611 Unilateral primary osteoarthritis, right hip: Secondary | ICD-10-CM | POA: Insufficient documentation

## 2021-11-27 NOTE — Progress Notes (Signed)
? ?Subjective:  ?Patient ID: Lee [name], male    DOB: 28-Dec-1945  Age: 76 y.o. MRN: [id number] ? ?CC: No chief complaint on file. ? ? ?HPI ?Lee [name] presents for Parkinson's, gait problems, depression - worse. Pt declined labs ? ?Outpatient Medications Prior to Visit  ?Medication Sig Dispense Refill  ? albuterol (PROVENTIL HFA;VENTOLIN HFA) 108 (90 Base) MCG/ACT inhaler Inhale 2 puffs into the lungs every 4 (four) hours as needed for wheezing or shortness of breath. 1 Inhaler 0  ? Alum Hydroxide-Mag Carbonate 160-105 MG CHEW CHEW 1 TABLET BY MOUTH AS DIRECTED BY YOUR PROVIDER FOR STOMACH UPSET/DYSPEPSIA UP TO THREE TIMES DAILY    ? ascorbic acid (VITAMIN C) 500 MG tablet TAKE ONE TABLET BY MOUTH DAILY TO HELP WITH IRON STORES AND IMMUNE SYSTEM. PLEASE TAKE EVERY DAY AND ESPECIALLY WHEN YOU TAKE FERROUS SULFATE.    ? BLACK PEPPER-TURMERIC PO Take by mouth daily. Mixes as a powder for back pain (ginger, cinnamon, black pepper, turmeric)    ? CINNAMON PO Mixes as a powder for back pain (ginger, cinnamon, black pepper, turmeric)    ? FEROSUL 325 (65 Fe) MG tablet Take by mouth.    ? fluticasone (FLONASE) 50 MCG/ACT nasal spray INSTILL 1 SPRAY IN EACH NOSTRIL TWICE A DAY FOR DIZZINESS    ? folic acid (FOLVITE) 1 MG tablet Take 1 mg by mouth daily.    ? Ginger, Zingiber officinalis, (GINGER PO) Mixes as a powder for back pain (ginger, cinnamon, black pepper, turmeric)    ? mirtazapine (REMERON) 7.5 MG tablet Take 1 tablet (7.5 mg total) by mouth at bedtime. 30 tablet 6  ? mometasone-formoterol (DULERA) 200-5 MCG/ACT AERO Inhale 2 puffs into the lungs in the morning and at bedtime. 3 each 2  ? montelukast (SINGULAIR) 10 MG tablet Take 1 tablet (10 mg total) by mouth at bedtime. 30 tablet 6  ? Multiple Vitamin (QUINTABS) TABS Take by mouth.    ? OVER THE COUNTER MEDICATION NeuroQ daily    ? oxybutynin (DITROPAN-XL) 10 MG 24 hr tablet Take 10 mg by mouth at bedtime.    ? pantoprazole (PROTONIX) 40 MG tablet  Take by mouth.    ? tamsulosin (FLOMAX) 0.4 MG CAPS capsule Take 0.4 mg by mouth.    ? Tiotropium Bromide Monohydrate 2.5 MCG/ACT AERS Inhale 2 puffs into the lungs daily.    ? vitamin C (ASCORBIC ACID) 500 MG tablet Take by mouth.    ? Ergocalciferol (VITAMIN D2) 50 MCG (2000 UT) TABS Take by mouth.    ? carbidopa-levodopa (SINEMET IR) 25-100 MG tablet Take 2 tablets by mouth 3 (three) times daily. (Patient not taking: Reported on 10/09/2021) 540 tablet 4  ? ?No facility-administered medications prior to visit.  ? ? ?ROS: ?Review of Systems  ?Constitutional:  Positive for fatigue. Negative for appetite change and unexpected weight change.  ?HENT:  Negative for congestion, nosebleeds, sneezing, sore throat and trouble swallowing.   ?Eyes:  Negative for itching and visual disturbance.  ?Respiratory:  Negative for cough.   ?Cardiovascular:  Negative for chest pain, palpitations and leg swelling.  ?Gastrointestinal:  Negative for abdominal distention, blood in stool, diarrhea and nausea.  ?Genitourinary:  Negative for frequency and hematuria.  ?Musculoskeletal:  Positive for arthralgias and gait problem. Negative for back pain, joint swelling and neck pain.  ?Skin:  Negative for rash.  ?Neurological:  Positive for dizziness, tremors, speech difficulty, weakness and light-headedness. Negative for syncope.  ?Psychiatric/Behavioral:  Positive for decreased concentration and  dysphoric mood. Negative for agitation, confusion, sleep disturbance and suicidal ideas. The patient is nervous/anxious.   ? ?Objective:  ?BP 90/62 (BP Location: Left Arm, Patient Position: Sitting, Cuff Size: Normal)   Pulse 85   Temp 98.1 ?F (36.7 ?C) (Oral)   Ht 5' 10.5" (1.791 m)   Wt 154 lb (69.9 kg)   SpO2 93%   BMI 21.78 kg/m?  ? ?BP Readings from Last 3 Encounters:  ?11/27/21 90/62  ?08/28/21 (!) 142/70  ?07/05/21 112/68  ? ? ?Wt Readings from Last 3 Encounters:  ?11/27/21 154 lb (69.9 kg)  ?08/28/21 152 lb 6.4 oz (69.1 kg)  ?07/05/21 151  lb 3.2 oz (68.6 kg)  ? ? ?Physical Exam ?Constitutional:   ?   General: He is not in acute distress. ?   Appearance: Normal appearance. He is well-developed.  ?   Comments: NAD  ?Eyes:  ?   Conjunctiva/sclera: Conjunctivae normal.  ?   Pupils: Pupils are equal, round, and reactive to light.  ?Neck:  ?   Thyroid: No thyromegaly.  ?   Vascular: No JVD.  ?Cardiovascular:  ?   Rate and Rhythm: Normal rate and regular rhythm.  ?   Heart sounds: Normal heart sounds. No murmur heard. ?  No friction rub. No gallop.  ?Pulmonary:  ?   Effort: Pulmonary effort is normal. No respiratory distress.  ?   Breath sounds: Normal breath sounds. No wheezing or rales.  ?Chest:  ?   Chest wall: No tenderness.  ?Abdominal:  ?   General: Bowel sounds are normal. There is no distension.  ?   Palpations: Abdomen is soft. There is no mass.  ?   Tenderness: There is no abdominal tenderness. There is no guarding or rebound.  ?Musculoskeletal:     ?   General: No tenderness. Normal range of motion.  ?   Cervical back: Normal range of motion.  ?   Right lower leg: No edema.  ?   Left lower leg: No edema.  ?Lymphadenopathy:  ?   Cervical: No cervical adenopathy.  ?Skin: ?   General: Skin is warm and dry.  ?   Findings: No rash.  ?Neurological:  ?   Mental Status: He is alert and oriented to person, place, and time. Mental status is at baseline.  ?   Cranial Nerves: No cranial nerve deficit.  ?   Motor: Weakness present. No abnormal muscle tone.  ?   Coordination: Coordination abnormal.  ?   Gait: Gait abnormal.  ?   Deep Tendon Reflexes: Reflexes abnormal.  ?Psychiatric:     ?   Behavior: Behavior normal.     ?   Thought Content: Thought content normal.     ?   Judgment: Judgment normal.  ?Tremors ?Using a cane ?Less animated face ?Looks depressed ? ?Lab Results  ?Component Value Date  ? WBC 4.2 08/28/2021  ? HGB 13.7 08/28/2021  ? HCT 41.4 08/28/2021  ? PLT 195.0 08/28/2021  ? GLUCOSE 101 (H) 08/28/2021  ? CHOL 225 (H) 02/01/2021  ? TRIG 56.0  02/01/2021  ? HDL 88.10 02/01/2021  ? LDLCALC 126 (H) 02/01/2021  ? ALT 19 08/28/2021  ? AST 20 08/28/2021  ? NA 139 08/28/2021  ? K 3.8 08/28/2021  ? CL 103 08/28/2021  ? CREATININE 1.06 08/28/2021  ? BUN 20 08/28/2021  ? CO2 28 08/28/2021  ? TSH 2.04 08/28/2021  ? PSA 5.09 (H) 02/01/2021  ? ? ?No results found. ? ?Assessment &  Plan:  ? ?Problem List Items Addressed This Visit   ? ? Tremor  ?  Declined meds in the past. ?F/u w/Neurology - Dr Leta Baptist in 12/2021 ?In PT ?Pt refused meds for Parkinson's - "There are foods that can help" ?  ?  ? Parkinson's disease ([place])  ?  In PT ?I suggested to start using a rollator walker ?Not using meds. I suggested he tries other meds ? F/u w/Neurology - Dr Leta Baptist in 12/2021 ?Pt refused meds for Parkinson's - "There are foods that can help".  I suggested he tries other meds ?  ?  ? Depression  ?  Worse ?Declined meds in the past. ?On Mirtazapine - I'm not sure if using it ? F/u w/Neurology - Dr Leta Baptist in 12/2021 ?  ?  ?  ? ? ?No orders of the defined types were placed in this encounter. ?  ? ? ?Follow-up: Return in about 3 months (around 02/27/2022) for a follow-up visit. ? ?Walker Kehr, MD ?

## 2021-11-27 NOTE — Assessment & Plan Note (Addendum)
Worse ?Declined meds in the past. ?On Mirtazapine - I'm not sure if using it ? F/u w/Neurology - Dr Leta Baptist in 12/2021 ?

## 2021-11-27 NOTE — Assessment & Plan Note (Addendum)
In PT ?I suggested to start using a rollator walker ?Not using meds. I suggested he tries other meds ? F/u w/Neurology - Dr Leta Baptist in 12/2021 ?Pt refused meds for Parkinson's - "There are foods that can help".  I suggested he tries other meds ?

## 2021-11-27 NOTE — Assessment & Plan Note (Addendum)
Declined meds in the past. ?F/u w/Neurology - Dr Leta Baptist in 12/2021 ?In PT ?Pt refused meds for Parkinson's - "There are foods that can help" ?

## 2021-12-04 NOTE — Therapy (Signed)
?OUTPATIENT OCCUPATIONAL THERAPY PARKINSON'S EVALUATION ? ?Patient Name: Lee [name] ?MRN: [id number] ?DOB:08/07/1945, 76 y.o., male ?Today's Date: 12/07/2021 ? ?PCP: Dr. Tyrone Apple [name] ?REFERRING PROVIDER: Penni Bombard, MD  ? ? OT End of Session - 12/07/21 1221   ? ? Visit Number 1   ? Number of Visits 25   ? Date for OT Re-Evaluation 03/07/22   ? Authorization Type ?VA auth; if no referral from the [place],  patient's [facility] Medicare benefits cover PT $6000 oop, has been met  Patient is covered 100%  VL: Follows Medicare guidelines   ? Authorization - Number of Visits 1   ? Progress Note Due on Visit 10   ? OT Start Time 1020   ? OT Stop Time 1100   ? OT Time Calculation (min) 40 min   ? Activity Tolerance Patient tolerated treatment well   ? Behavior During Therapy --   easily distracted  ? ?  ?  ? ?  ? ? ?Past Medical History:  ?Diagnosis Date  ? Acid reflux   ? Alcohol abuse   ? quit 10 y ago  ? Arthritis   ? BPH (benign prostatic hyperplasia)   ? CAD (coronary artery disease)   ? Cervical spondylosis without myelopathy 06/02/2015  ? COPD (chronic obstructive pulmonary disease) ([place])   ? Emphysema lung ([place])   ? GERD (gastroesophageal reflux disease)   ? High cholesterol   ? Hypertension   ? Parkinson's disease ([place])   ? PVD (peripheral vascular disease) ([place])   ? Tremor 06/02/2015  ? ?Past Surgical History:  ?Procedure Laterality Date  ? TONSILLECTOMY    ? VASECTOMY    ? ?Patient Active Problem List  ? Diagnosis Date Noted  ? Acquired deformities of toe(s), unspecified, unspecified foot 11/27/2021  ? Asthma 11/27/2021  ? Cervicalgia 11/27/2021  ? Corns and callosities 11/27/2021  ? Disturbance of skin sensation 11/27/2021  ? Elevated prostate specific antigen (PSA) 11/27/2021  ? Excessive attrition of teeth 11/27/2021  ? 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 Family history of cancer 11/27/2021  ? Family history of malignant neoplasm of prostate 11/27/2021  ? Hyperlipidemia 11/27/2021  ? Iron deficiency  anemia, unspecified 11/27/2021  ? 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 Myalgia 11/27/2021  ? Nail dystrophy 11/27/2021  ? Benign neoplasm of other parts of mouth 11/27/2021  ? Open angle with borderline findings, high risk, left eye 11/27/2021  ? Other abnormal glucose 11/27/2021  ? Other reduced mobility 11/27/2021  ? Other secondary cataract, bilateral 11/27/2021  ? Pain in right thigh 11/27/2021  ? Pain in right knee 11/27/2021  ? Pain in unspecified shoulder 11/27/2021  ? Persons encountering health services in other specified circumstances 11/27/2021  ? 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  ? 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 Right lower quadrant pain 11/27/2021  ? Tinnitus 11/27/2021  ? Trochanteric bursitis, right hip 11/27/2021  ? Unilateral primary osteoarthritis, right hip 11/27/2021  ? Unspecified fall, initial encounter 11/27/2021  ? Vitamin D deficiency, unspecified 11/27/2021  ? Weakness 11/27/2021  ? Constipation 08/28/2021  ? Chronic obstructive pulmonary disease ([place]) 05/31/2021  ? Depression 04/13/2021  ? Parkinson's disease ([name]) 02/28/2021  ? Weakness of both legs 02/01/2021  ? Dysphagia 02/01/2021  ? Weakness of right leg 01/06/2021  ? Lumbar radiculopathy 12/30/2020  ? Prostate cancer ([place]) 06/09/2020  ? Well adult exam 06/04/2019  ? BPH (benign prostatic hyperplasia) 06/04/2019  ? Bilateral carotid bruits 06/04/2019  ? Meniere disease,  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  ? Emphysema of lung ([place]) 06/04/2019  ? Spondylolisthesis at L4-L5 level 08/27/2017  ? Spinal stenosis in cervical region 08/27/2017  ? Tremor 06/02/2015  ? Cervical spondylosis without myelopathy 06/02/2015  ? 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 Coronary arteriosclerosis 07/30/2009  ? History of colonoscopy 07/30/2009  ? Neutropenia ([place]) 07/30/2008  ? HIP PAIN 11/27/2007  ? Essential hypertension 07/25/2007  ? Atrial fibrillation ([place]) 07/25/2007  ? GERD  07/25/2007  ? ERECTILE DYSFUNCTION 07/25/2007  ? LOW BACK PAIN 07/25/2007  ? ? ?ONSET DATE: 11/21/21 ? ?REFERRING DIAG: Parkinson's disease; Z74.09,Z78.9 (ICD-10-CM) - Impaired mobility and ADLs  ? ?THERAPY DIAG:  ?Other symptoms and signs involving the nervous system ? ?Abnormal posture ? ?Other symptoms and signs involving the musculoskeletal system ? ?Unsteadiness on feet ? ?Other lack of coordination ? ?Tremor ? ?Attention and concentration deficit ? ?Frontal lobe and executive function deficit ? ?SUBJECTIVE:  ? ?SUBJECTIVE STATEMENT: ?Pt reports that he stopped taking Sinemet a couple months ago due to side effects (confusion, dizziness).  Pt reports [place] doctor is aware (recommended pt call neurologist).  Pt reports that he is receiving acupuncture and ?energy work.  Pt is also doing dietary changes.  Pt reports ability to move fluctuates during the day and right now is a "good time"  ? ?Pt accompanied by: self ? ?PERTINENT HISTORY:   Parkinson's disease--dx in March 2022 per pt report.  (just started back on Sinemet) ? ?Other PMH:  neck and back pain, arthritis, hx of ETOH abuse, CAD, COPD, emphysema, GERD, HTN, high cholesterol, PVD, chronic vertigo per pt  ? ?PRECAUTIONS: Fall ? ?WEIGHT BEARING RESTRICTIONS No ? ?PAIN:  ?Are you having pain? No ? ?FALLS: Has patient fallen in last 6 months? multiple "lost count"   pt reports that he falls daily, but can usually catch himself--bumps and bruises ? ?LIVING ENVIRONMENT: ?Lives with: lives alone.  Family help is limited ?Lives in: House/apartment ?Stairs: Yes: Internal: ) steps; none and External: 3 steps; can reach both ?Has following equipment at home: Single point cane ? ?PLOF: Independent ? ?PATIENT GOALS move better, stay independent ? ?OBJECTIVE:  ? ?HAND DOMINANCE: Right ? ?ADLs: ? ?Transfers/ambulation related to ADLs: ?Eating: decr appetite, 1 meal/day and then fruit t/o the day and ensure, mod I  ?Grooming: difficulty trimming facial hair and brushing  teeth (sometimes uses 2 hands) ?UB Dressing: mod I, but difficulty/fatigue/incr time ?LB Dressing: difficulty/fatigue/incr time, difficulty with buttons ?Toileting: raised seat, mod I ?Bathing: mod I ?Tub Shower transfers: has tub with grab bar (difficulty), always takes a bath ?Equipment: Grab bars ? ? ?IADLs: ?Light housekeeping: significant incr time to thread needed, incr time/difficulty ?Meal Prep: rarely uses knife, difficulty screwing lid on bottle ?Community mobility: independent, but feels that RLE reaction time is decr  ?Medication management: mod I ?Financial management: mod I ?Handwriting:  pt reports significant difficulty/decr legibility ? ?MOBILITY STATUS: Hx of falls, Freezing, Festination, difficulty with turns, start hesitation, and "R foot stuck to ground ".  Pt also reports chronic vertigo with head movements ? ?POSTURE COMMENTS:  ?rounded shoulders ? ?ACTIVITY TOLERANCE: ?Activity tolerance: fatigue with dressing ? ?FUNCTIONAL OUTCOME MEASURES: ?Standing functional reach: Right: NT inches; Left: NT inches ?Fastening/unfastening 3 buttons: NT due to time constraints  ?Physical performance test: PPT#2 (simulated eating) 23.66sec, holds spoon at end & PPT#4 (donning/doffing jacket): 18.28sec  ? ?COORDINATION: ?9 Hole Peg test: Right: 38.63 sec; Left: 35.56 sec ?Box and Blocks:  Right 39blocks, Left 44blocks ?Tremors: Resting  and Right ? ?UE ROM:   R shoulder flex 130* with -15* elbow ext, L shoulder flex 140* with elbow ext WNL ? ?UE MMT:   Not tested ? ?SENSATION: ?Not tested ? ? ? ?MUSCLE TONE: RUE: Mild and Rigidity and LUE: Within functional limits ? ?COGNITION: ?Overall cognitive status: Impaired: per pt report, will assess further in functional context prn .   ? ?OBSERVATIONS: Bradykinesia and Hypokinesia ? ? ?TODAY'S TREATMENT:  ?See pt education ? ? ?PATIENT EDUCATION: ?Education details: Importance of optimal medication + therapy/exercise for best outcomes with PD--recommended pt call  neurologist as he stopped taking Sinemet a couple of months ago due to side effects and has had incr functional difficulties.  Discussed OT eval results and POC ?Person educated: Patient ?Education method: Expl

## 2021-12-07 ENCOUNTER — Ambulatory Visit: Payer: Medicare Other | Attending: Internal Medicine | Admitting: Occupational Therapy

## 2021-12-07 ENCOUNTER — Encounter: Payer: Self-pay | Admitting: Occupational Therapy

## 2021-12-07 ENCOUNTER — Telehealth: Payer: Self-pay | Admitting: Internal Medicine

## 2021-12-07 DIAGNOSIS — R251 Tremor, unspecified: Secondary | ICD-10-CM | POA: Diagnosis not present

## 2021-12-07 DIAGNOSIS — R29898 Other symptoms and signs involving the musculoskeletal system: Secondary | ICD-10-CM | POA: Diagnosis not present

## 2021-12-07 DIAGNOSIS — R41840 Attention and concentration deficit: Secondary | ICD-10-CM

## 2021-12-07 DIAGNOSIS — R41844 Frontal lobe and executive function deficit: Secondary | ICD-10-CM

## 2021-12-07 DIAGNOSIS — R29818 Other symptoms and signs involving the nervous system: Secondary | ICD-10-CM | POA: Diagnosis not present

## 2021-12-07 DIAGNOSIS — R293 Abnormal posture: Secondary | ICD-10-CM

## 2021-12-07 DIAGNOSIS — R2681 Unsteadiness on feet: Secondary | ICD-10-CM | POA: Diagnosis not present

## 2021-12-07 DIAGNOSIS — R278 Other lack of coordination: Secondary | ICD-10-CM

## 2021-12-07 NOTE — Telephone Encounter (Signed)
[facility] called in and states they received a referral for the pt by his neurologist.  ? ?However, there is no VA authorization. Requesting that MD contact [facility] to request services for PT, OT & Speech.  ? ?Please call with any questions. ?

## 2021-12-10 NOTE — Telephone Encounter (Signed)
I think he has to do it through his [facility].  Probably needs to make an appointment to see his primary care [facility] physician.  Thanks ?

## 2021-12-12 NOTE — Telephone Encounter (Signed)
LVM for pt of Dr. Judeen Hammans instructions to set up an apptmtn with his [facility] PCP for this referral. ?

## 2021-12-13 ENCOUNTER — Ambulatory Visit: Payer: Medicare Other | Admitting: Occupational Therapy

## 2021-12-15 NOTE — Therapy (Incomplete)
OUTPATIENT OCCUPATIONAL THERAPY PARKINSON'S TREATMENT  Patient Name: Lee [name] MRN: [id number] DOB:[birth year], 76 y.o., male Today's Date: 12/15/2021  PCP: Dr. Lew Dawes REFERRING PROVIDER: Penni Bombard, MD      Past Medical History:  Diagnosis Date   Acid reflux    Alcohol abuse    quit 10 y ago   Arthritis    BPH (benign prostatic hyperplasia)    CAD (coronary artery disease)    Cervical spondylosis without myelopathy 06/02/2015   COPD (chronic obstructive pulmonary disease) (HCC)    Emphysema lung (HCC)    GERD (gastroesophageal reflux disease)    High cholesterol    Hypertension    Parkinson's disease ([place])    PVD (peripheral vascular disease) ([place])    Tremor 06/02/2015   Past Surgical History:  Procedure Laterality Date   TONSILLECTOMY     VASECTOMY     Patient Active Problem List   Diagnosis Date Noted   Acquired deformities of toe(s), unspecified, unspecified foot 11/27/2021   Asthma 11/27/2021   Cervicalgia 11/27/2021   Corns and callosities 11/27/2021   Disturbance of skin sensation 11/27/2021   Elevated prostate specific antigen (PSA) 11/27/2021   Excessive attrition of teeth 11/27/2021   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   Family history of cancer 11/27/2021   Family history of malignant neoplasm of prostate 11/27/2021   Hyperlipidemia 11/27/2021   Iron deficiency anemia, unspecified 11/27/2021   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[place]) 11/27/2021   Myalgia 11/27/2021   Nail dystrophy 11/27/2021   Benign neoplasm of other parts of mouth 11/27/2021   Open angle with borderline findings, high risk, left eye 11/27/2021   Other abnormal glucose 11/27/2021   Other reduced mobility 11/27/2021   Other secondary cataract, bilateral 11/27/2021   Pain in right thigh 11/27/2021   Pain in right knee 11/27/2021   Pain in unspecified shoulder 11/27/2021   Persons encountering health services in other specified circumstances  11/27/2021   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/27/2021   Right lower quadrant pain 11/27/2021   Tinnitus 11/27/2021   Trochanteric bursitis, right hip 11/27/2021   Unilateral primary osteoarthritis, right hip 11/27/2021   Unspecified fall, initial encounter 11/27/2021   Vitamin D deficiency, unspecified 11/27/2021   Weakness 11/27/2021   Constipation 08/28/2021   Chronic obstructive pulmonary disease ([place]) 05/31/2021   Depression 04/13/2021   Parkinson's disease ([place]) 02/28/2021   Weakness of both legs 02/01/2021   Dysphagia 02/01/2021   Weakness of right leg 01/06/2021   Lumbar radiculopathy 12/30/2020   Prostate cancer ([place]) 06/09/2020   Well adult exam 06/04/2019   BPH (benign prostatic hyperplasia) 06/04/2019   Bilateral carotid bruits 06/04/2019   Meniere disease, 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   Emphysema of lung ([place]) 06/04/2019   Spondylolisthesis at L4-L5 level 08/27/2017   Spinal stenosis in cervical region 08/27/2017   Tremor 06/02/2015   Cervical spondylosis without myelopathy 06/02/2015   Alcoholism in remission ([place]) 06/09/2014   Coronary arteriosclerosis 07/30/2009   History of colonoscopy 07/30/2009   Neutropenia ([place]) 07/30/2008   HIP PAIN 11/27/2007   Essential hypertension 07/25/2007   Atrial fibrillation ([place]) 07/25/2007   GERD 07/25/2007   ERECTILE DYSFUNCTION 07/25/2007   LOW BACK PAIN 07/25/2007    ONSET DATE: 11/21/21  REFERRING DIAG: Parkinson's disease; Z74.09,Z78.9 (ICD-10-CM) - Impaired mobility and ADLs   THERAPY DIAG:  No diagnosis found.  SUBJECTIVE:   SUBJECTIVE STATEMENT: ***  Pt reports that he stopped taking Sinemet a couple months ago  due to side effects (confusion, dizziness).  Pt reports [place] doctor is aware (recommended pt call neurologist).  Pt reports that he is receiving acupuncture and ?energy work.  Pt is also doing dietary changes.  Pt reports ability to  move fluctuates during the day and right now is a "good time"   Pt accompanied by: self  PERTINENT HISTORY:   Parkinson's disease--dx in March 2022 per pt report.  (just started back on Sinemet)  Other PMH:  neck and back pain, arthritis, hx of ETOH abuse, CAD, COPD, emphysema, GERD, HTN, high cholesterol, PVD, chronic vertigo per pt   PRECAUTIONS: Fall  WEIGHT BEARING RESTRICTIONS No  PAIN:  Are you having pain? No  PATIENT GOALS move better, stay independent    TODAY'S TREATMENT:  ***   PATIENT EDUCATION: Education details: I*** mportance of optimal medication + therapy/exercise for best outcomes with PD--recommended pt call neurologist as he stopped taking Sinemet a couple of months ago due to side effects and has had incr functional difficulties.  Discussed OT eval results and POC Person educated: Patient Education method: Explanation Education comprehension: verbalized understanding   HOME EXERCISE PROGRAM: Not yet issued  ASSESSMENT:  CLINICAL IMPRESSION: *** Patient is a 76 y.o. male who was seen today for occupational therapy evaluation for Parkinson's Disease.  Pt participated in [place] on 11/21/21 and OT, PT, and ST was recommended due to functional decline.  Pt reports that he was diagnosed 09/2020 and has no prior occupational therapy.  Pt reports that he quit taking Sinemet due to side effects and that PCP at [facility] was aware and was going to contact neurologist.  Pt also with PMH that includes:  neck and back pain, PD -dx in March 2022 per pt report, hx of ETOH abuse, CAD, COPD, emphysema, GERD, HTN, high cholesterol, PVD.  Pt presents today with bradykinesia, rigidity, decr coordination, decr ROM, decr posture, tremor, and decr balance/functional mobility.  Pt would benefit from occupational therapy to improve ease and safety with ADLs/IADLs, decr risk for future complications, and establish PD-specific HEP.  PERFORMANCE DEFICITS in functional skills  including ADLs, IADLs, coordination, dexterity, tone, ROM, FMC, GMC, mobility, balance, decreased knowledge of precautions, decreased knowledge of use of DME, and UE functional use, cognitive skills including attention and memory, and psychosocial skills including habits and routines and behaviors.   IMPAIRMENTS are limiting patient from ADLs and IADLs.   COMORBIDITIES has co-morbidities such as CAD, COPD, HTN, hx of neck and back pain  that affects occupational performance. Patient will benefit from skilled OT to address above impairments and improve overall function.  MODIFICATION OR ASSISTANCE TO COMPLETE EVALUATION: Min-Moderate modification of tasks or assist with assess necessary to complete an evaluation.  OT OCCUPATIONAL PROFILE AND HISTORY: Detailed assessment: Review of records and additional review of physical, cognitive, psychosocial history related to current functional performance.  CLINICAL DECISION MAKING: Moderate - several treatment options, min-mod task modification necessary  REHAB POTENTIAL: Good  EVALUATION COMPLEXITY: Moderate     GOALS: Potential Goals reviewed with patient? Yes  SHORT TERM GOALS: Target date: 01/04/2022  Pt will be independent with PD-specific HEP.   Goal status: INITIAL  2.  Pt will verbalize understanding of ways to decr risk of future complications and appropriate community resources.  Goal status: INITIAL  3.  Pt will demo at least 140* shoulder flex with at least -15* elbow ext for improved RUE functional reach  Baseline: 130* shoulder flex with -15* elbow ext Goal status: INITIAL  4.  Pt demo incr ease with eating as shown by improving time on PPT#2 (simulated eating ) to 19sec or less. Baseline: 23.66 Goal status: INITIAL  5.  Pt will report incr ease with grooming tasks and ability to use only RUE.  Goal status: INITIAL  6.  Pt will report dressing without fatigue.  Goal status: INITIAL  LONG TERM GOALS: Target date:  03/09/2022  Pt will verbalize understanding of adaptive strategies/AE to incr ease and safety with ADLs/IADLs.  Goal status: INITIAL  2.  Pt will improve R hand coordination for ADLs as shown by completing 9-hole peg test in 34 sec or less. Baseline: 38.63sec Goal status: INITIAL  3.  Pt will improve RUE coordination and functional reaching for ADLs as shown by improving score on box and blocks test by at least 5. Baseline: 39 blocks Goal status: INITIAL  4.  Pt will be able to write at least 3 sentences with good legibility and only min decr in size.  Goal status: INITIAL  5.  Assess standing functional reach and set goal as appropriate. Baseline: Not tested due to time constraints Goal status: INITIAL  6.  Assess fastening/unfastening 3 buttons and set goal as appropriate for incr ease with dressing/bilateral hand coordination. Baseline: Not tested due to time constraints Goal status: INITIAL  PLAN: OT FREQUENCY: 2x/week  OT DURATION: 12 weeks, may modify due to scheduling/progress  PLANNED INTERVENTIONS: self care/ADL training, therapeutic exercise, therapeutic activity, neuromuscular re-education, manual therapy, passive range of motion, balance training, functional mobility training, aquatic therapy, ultrasound, paraffin, fluidotherapy, moist heat, cryotherapy, patient/family education, cognitive remediation/compensation, visual/perceptual remediation/compensation, energy conservation, coping strategies training, and DME and/or AE instructions  RECOMMENDED OTHER SERVICES: scheduled for evals with PT and ST  CONSULTED AND AGREED WITH PLAN OF CARE: Patient  PLAN FOR NEXT SESSION: *** assess standing functional reach and fastening/unfastening 3 buttons and set goals prn; initiate HEP (PWR! In sitting, PWR! Hands)   Vianne Bulls, [place] 12/15/2021, 4:49 PM  Vianne Bulls, OTR/L [facility] [address] [place], [place]   [address] [phone] phone [phone] 12/15/21 4:49 PM

## 2021-12-18 ENCOUNTER — Ambulatory Visit: Payer: Medicare Other | Admitting: Occupational Therapy

## 2021-12-19 NOTE — Therapy (Incomplete)
OUTPATIENT OCCUPATIONAL THERAPY TREATMENT NOTE   Patient Name: Lee [name] MRN: [id number] DOB:[birth year], 76 y.o., male Today's Date: 12/19/2021  PCP: Dr. Alain Marion REFERRING PROVIDER: Dr. Leta Baptist  END OF SESSION:    Past Medical History:  Diagnosis Date   Acid reflux    Alcohol abuse    quit 10 y ago   Arthritis    BPH (benign prostatic hyperplasia)    CAD (coronary artery disease)    Cervical spondylosis without myelopathy 06/02/2015   COPD (chronic obstructive pulmonary disease) (HCC)    Emphysema lung (HCC)    GERD (gastroesophageal reflux disease)    High cholesterol    Hypertension    Parkinson's disease ([place])    PVD (peripheral vascular disease) ([place])    Tremor 06/02/2015   Past Surgical History:  Procedure Laterality Date   TONSILLECTOMY     VASECTOMY     Patient Active Problem List   Diagnosis Date Noted   Acquired deformities of toe(s), unspecified, unspecified foot 11/27/2021   Asthma 11/27/2021   Cervicalgia 11/27/2021   Corns and callosities 11/27/2021   Disturbance of skin sensation 11/27/2021   Elevated prostate specific antigen (PSA) 11/27/2021   Excessive attrition of teeth 11/27/2021   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   Family history of cancer 11/27/2021   Family history of malignant neoplasm of prostate 11/27/2021   Hyperlipidemia 11/27/2021   Iron deficiency anemia, unspecified 11/27/2021   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[place]) 11/27/2021   Myalgia 11/27/2021   Nail dystrophy 11/27/2021   Benign neoplasm of other parts of mouth 11/27/2021   Open angle with borderline findings, high risk, left eye 11/27/2021   Other abnormal glucose 11/27/2021   Other reduced mobility 11/27/2021   Other secondary cataract, bilateral 11/27/2021   Pain in right thigh 11/27/2021   Pain in right knee 11/27/2021   Pain in unspecified shoulder 11/27/2021   Persons encountering health services in other specified circumstances 11/27/2021    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/27/2021   Right lower quadrant pain 11/27/2021   Tinnitus 11/27/2021   Trochanteric bursitis, right hip 11/27/2021   Unilateral primary osteoarthritis, right hip 11/27/2021   Unspecified fall, initial encounter 11/27/2021   Vitamin D deficiency, unspecified 11/27/2021   Weakness 11/27/2021   Constipation 08/28/2021   Chronic obstructive pulmonary disease ([place]) 05/31/2021   Depression 04/13/2021   Parkinson's disease ([place]) 02/28/2021   Weakness of both legs 02/01/2021   Dysphagia 02/01/2021   Weakness of right leg 01/06/2021   Lumbar radiculopathy 12/30/2020   Prostate cancer ([place]) 06/09/2020   Well adult exam 06/04/2019   BPH (benign prostatic hyperplasia) 06/04/2019   Bilateral carotid bruits 06/04/2019   Meniere disease, 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   Emphysema of lung ([place]) 06/04/2019   Spondylolisthesis at L4-L5 level 08/27/2017   Spinal stenosis in cervical region 08/27/2017   Tremor 06/02/2015   Cervical spondylosis without myelopathy 06/02/2015   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  Rationale for Evaluation and Treatment Rehabilitation  PERTINENT HISTORY: ***Parkinson's disease--dx in March 2022 per pt report.  (  just started back on Sinemet)   Other PMH:  neck and back pain, arthritis, hx of ETOH abuse, CAD, COPD, emphysema, GERD, HTN, high cholesterol, PVD, chronic vertigo per pt     PRECAUTIONS: fall   SUBJECTIVE:  ***  PAIN:  Are you having pain? {OPRCPAIN:[id number]}     OBJECTIVE:   TODAY'S TREATMENT: ***  (Copy Today's treatment to Plan section here) PATIENT EDUCATION: Education details: Importance of optimal medication + therapy/exercise for best outcomes with PD--recommended pt call neurologist as he stopped taking Sinemet a couple of months ago due to side effects and has had incr functional difficulties.  Discussed OT eval results and POC Person educated: Patient Education method: Explanation Education comprehension: verbalized understanding     HOME EXERCISE PROGRAM: Not yet issued   ASSESSMENT:   CLINICAL IMPRESSION: Patient is a 76 y.o. male who was seen today for occupational therapy evaluation for Parkinson's Disease.  Pt participated in [place] on 11/21/21 and OT, PT, and ST was recommended due to functional decline.  Pt reports that he was diagnosed 09/2020 and has no prior occupational therapy.  Pt reports that he quit taking Sinemet due to side effects and that PCP at [facility] was aware and was going to contact neurologist.  Pt also with PMH that includes:  neck and back pain, PD -dx in March 2022 per pt report, hx of ETOH abuse, CAD, COPD, emphysema, GERD, HTN, high cholesterol, PVD.  Pt presents today with bradykinesia, rigidity, decr coordination, decr ROM, decr posture, tremor, and decr balance/functional mobility.  Pt would benefit from occupational therapy to improve ease and safety with ADLs/IADLs, decr risk for future complications, and establish PD-specific HEP.   PERFORMANCE DEFICITS in functional skills including ADLs, IADLs, coordination, dexterity, tone, ROM, FMC, GMC, mobility, balance, decreased knowledge of precautions, decreased knowledge of use of DME, and UE functional use, cognitive skills including attention and memory, and psychosocial skills including habits and routines and behaviors.    IMPAIRMENTS are limiting patient from ADLs and IADLs.     COMORBIDITIES has co-morbidities such as CAD, COPD, HTN, hx of neck and back pain  that affects occupational performance. Patient will benefit from skilled OT to address above impairments and improve overall function.   MODIFICATION OR ASSISTANCE TO COMPLETE EVALUATION: Min-Moderate modification of tasks or assist with assess necessary to complete an evaluation.   OT OCCUPATIONAL PROFILE AND HISTORY: Detailed assessment: Review of records and additional review of physical, cognitive, psychosocial history related to current functional performance.   CLINICAL DECISION MAKING: Moderate - several treatment options, min-mod task modification necessary   REHAB POTENTIAL: Good   EVALUATION COMPLEXITY: Moderate       GOALS: Potential Goals reviewed with patient? Yes   SHORT TERM GOALS: Target date: 01/04/2022   Pt will be independent with PD-specific HEP.    Goal status: INITIAL   2.  Pt will verbalize understanding of ways to decr risk of future complications and appropriate community resources.   Goal status: INITIAL   3.  Pt will demo at least 140* shoulder flex with at least -15* elbow ext for improved RUE functional reach  Baseline: 130* shoulder flex with -15* elbow ext Goal status: INITIAL   4.  Pt demo incr ease with eating as shown by improving time on PPT#2 (simulated eating ) to 19sec or less. Baseline: 23.66 Goal status: INITIAL   5.  Pt will report incr ease with grooming tasks and ability to use only RUE.   Goal status:  INITIAL   6.  Pt will report dressing without fatigue.   Goal status: INITIAL   LONG TERM GOALS: Target date: 03/01/2022   Pt will verbalize understanding of adaptive strategies/AE to incr ease and safety with ADLs/IADLs.   Goal status: INITIAL   2.  Pt will improve R hand coordination for ADLs as shown by completing 9-hole peg test in 34 sec or less. Baseline: 38.63sec Goal status: INITIAL   3.  Pt will improve RUE coordination and functional  reaching for ADLs as shown by improving score on box and blocks test by at least 5. Baseline: 39 blocks Goal status: INITIAL   4.  Pt will be able to write at least 3 sentences with good legibility and only min decr in size.   Goal status: INITIAL   5.  Assess standing functional reach and set goal as appropriate. Baseline: Not tested due to time constraints Goal status: INITIAL   6.  Assess fastening/unfastening 3 buttons and set goal as appropriate for incr ease with dressing/bilateral hand coordination. Baseline: Not tested due to time constraints Goal status: INITIAL   PLAN: OT FREQUENCY: 2x/week   OT DURATION: 12 weeks, may modify due to scheduling/progress   PLANNED INTERVENTIONS: self care/ADL training, therapeutic exercise, therapeutic activity, neuromuscular re-education, manual therapy, passive range of motion, balance training, functional mobility training, aquatic therapy, ultrasound, paraffin, fluidotherapy, moist heat, cryotherapy, patient/family education, cognitive remediation/compensation, visual/perceptual remediation/compensation, energy conservation, coping strategies training, and DME and/or AE instructions   RECOMMENDED OTHER SERVICES: scheduled for evals with PT and ST   CONSULTED AND AGREED WITH PLAN OF CARE: Patient   PLAN FOR NEXT SESSION: assess standing functional reach and fastening/unfastening 3 buttons and set goals prn; initiate HEP (PWR! In sitting, PWR! Hands)    Day Deery, OT 12/19/2021, 12:41 PM

## 2021-12-20 ENCOUNTER — Ambulatory Visit: Payer: Medicare Other | Admitting: Occupational Therapy

## 2021-12-22 ENCOUNTER — Ambulatory Visit: Payer: Medicare Other | Admitting: Physical Therapy

## 2021-12-22 ENCOUNTER — Ambulatory Visit: Payer: Medicare Other

## 2021-12-29 ENCOUNTER — Ambulatory Visit (INDEPENDENT_AMBULATORY_CARE_PROVIDER_SITE_OTHER): Payer: No Typology Code available for payment source | Admitting: Pulmonary Disease

## 2021-12-29 ENCOUNTER — Encounter: Payer: Self-pay | Admitting: Pulmonary Disease

## 2021-12-29 VITALS — BP 118/68 | HR 87 | Ht 70.5 in | Wt 153.2 lb

## 2021-12-29 DIAGNOSIS — J449 Chronic obstructive pulmonary disease, unspecified: Secondary | ICD-10-CM

## 2021-12-29 LAB — PULMONARY FUNCTION TEST
DL/VA % pred: 56 %
DL/VA: 2.24 ml/min/mmHg/L
DLCO cor % pred: 43 %
DLCO cor: 11.06 ml/min/mmHg
DLCO unc % pred: 43 %
DLCO unc: 11.06 ml/min/mmHg
FEF 25-75 Post: 0.45 L/sec
FEF 25-75 Pre: 0.43 L/sec
FEF2575-%Change-Post: 5 %
FEF2575-%Pred-Post: 19 %
FEF2575-%Pred-Pre: 17 %
FEV1-%Change-Post: 1 %
FEV1-%Pred-Post: 38 %
FEV1-%Pred-Pre: 37 %
FEV1-Post: 1.1 L
FEV1-Pre: 1.07 L
FEV1FVC-%Change-Post: 3 %
FEV1FVC-%Pred-Pre: 62 %
FEV6-%Change-Post: 0 %
FEV6-%Pred-Post: 60 %
FEV6-%Pred-Pre: 59 %
FEV6-Post: 2.19 L
FEV6-Pre: 2.17 L
FEV6FVC-%Change-Post: 2 %
FEV6FVC-%Pred-Post: 103 %
FEV6FVC-%Pred-Pre: 100 %
FVC-%Change-Post: -1 %
FVC-%Pred-Post: 58 %
FVC-%Pred-Pre: 59 %
FVC-Post: 2.24 L
FVC-Pre: 2.27 L
Post FEV1/FVC ratio: 49 %
Post FEV6/FVC ratio: 98 %
Pre FEV1/FVC ratio: 47 %
Pre FEV6/FVC Ratio: 96 %
RV % pred: 173 %
RV: 4.44 L
TLC % pred: 98 %
TLC: 7.04 L

## 2021-12-29 NOTE — Patient Instructions (Signed)
COPD with emphysema --ORDER Spacer --CONTINUE Dulera 200-5 mcg TWO puffs TWICE a day. Use with spacer --CONTINUE Spiriva 2.5 mcg TWO puffs ONCE a day --CONTINUE Albuterol nebulizer AS NEEDED for shortness of breath or wheezing --CONTINUE Singulair 10 mg daily  Action Plan For worsening shortness of breath, wheezing and cough that do not improve in 24-48 hours, please our office for evaluation and/or prednisone taper.  Follow-up with me in 3 months

## 2021-12-29 NOTE — Progress Notes (Signed)
Subjective:   PATIENT ID: Lee [name] GENDER: male DOB: Nov 14, 1945, MRN: [id number]   HPI  Chief Complaint  Patient presents with   Follow-up    PFT performed today. Pt states since being diagnosed with Parkinson's disease, it is harder for him to get around.    Reason for Visit: PFT follow-up  [name] is a 76 year old male former smoker with Parkinson's disease with gait and balance impairment, atrial fibrillation and HTN who presents for follow-up.   Initial Consult He was referred by [facility]. [facility]. Faxed notes reviewed. He was previously seen by Dr. Gwenette Greet at the [facility]. He is on [place]. Has a nebulizer machine. Has not been taking singulair on a routine basis. He was diagnosed with COPD >10 years. He reports shortness of breath requiring pursed lip breathing. Denies cough and wheezing. His gait issues are more limiting and has previously completed physical therapy.   12/29/21 Since our last visit started on Dulera in addition to his Spiriva.  PFTs were completed today. After his diagnosis of Parkinson it has been more difficult for him to ambulate and requires a lot of effort. He has shortness of breath associated with his exertion so is unsure if his current inhalers are helping. The timing of using the inhalers is not consistent as well given his sometimes limited hand function. Denies cough and wheezing.  Social History: 17 pack- years. 1 pack per day. Quit in 1982. [place] Previously [place]. Not active smoker  [employer]. Two years in [place]  Past Medical History:  Diagnosis Date   Acid reflux    Alcohol abuse    quit 10 y ago   Arthritis    BPH (benign prostatic hyperplasia)    CAD (coronary artery disease)    Cervical spondylosis without myelopathy 06/02/2015   COPD (chronic obstructive pulmonary disease) (HCC)    Emphysema lung (HCC)    GERD (gastroesophageal reflux disease)    High  cholesterol    Hypertension    Parkinson's disease ([place])    PVD (peripheral vascular disease) ([place])    Tremor 06/02/2015     Family History  Problem Relation Age of Onset   Cancer Brother 26       prostate ca   Irregular heart beat Mother    Irregular heart beat Father    Tremor Father    Cancer Sister      Social History   Occupational History   Occupation: retired  Tobacco Use   Smoking status: Former    Types: Cigarettes    Quit date: 06/09/2005    Years since quitting: 16.5   Smokeless tobacco: Never  Substance and Sexual Activity   Alcohol use: No    Comment: quit 10 years ago   Drug use: No   Sexual activity: Yes    Allergies  Allergen Reactions   Acyclovir Swelling   Carbidopa-Levodopa     It made me angry   Enalapril Maleate Other (See Comments)    Reaction:  Unknown      Outpatient Medications Prior to Visit  Medication Sig Dispense Refill   albuterol (PROVENTIL HFA;VENTOLIN HFA) 108 (90 Base) MCG/ACT inhaler Inhale 2 puffs into the lungs every 4 (four) hours as needed for wheezing or shortness of breath. 1 Inhaler 0   Alum Hydroxide-Mag Carbonate 160-105 MG CHEW CHEW 1 TABLET BY MOUTH AS DIRECTED BY YOUR PROVIDER FOR STOMACH UPSET/DYSPEPSIA UP TO THREE TIMES  DAILY     ascorbic acid (VITAMIN C) 500 MG tablet TAKE ONE TABLET BY MOUTH DAILY TO HELP WITH IRON STORES AND IMMUNE SYSTEM. PLEASE TAKE EVERY DAY AND ESPECIALLY WHEN YOU TAKE FERROUS SULFATE.     BLACK PEPPER-TURMERIC PO Take by mouth daily. Mixes as a powder for back pain (ginger, cinnamon, black pepper, turmeric)     CINNAMON PO Mixes as a powder for back pain (ginger, cinnamon, black pepper, turmeric)     FEROSUL 325 (65 Fe) MG tablet Take by mouth.     fluticasone (FLONASE) 50 MCG/ACT nasal spray INSTILL 1 SPRAY IN EACH NOSTRIL TWICE A DAY FOR DIZZINESS     folic acid (FOLVITE) 1 MG tablet Take 1 mg by mouth daily.     Ginger, Zingiber officinalis, (GINGER PO) Mixes as a powder for back pain  (ginger, cinnamon, black pepper, turmeric)     mirtazapine (REMERON) 7.5 MG tablet Take 1 tablet (7.5 mg total) by mouth at bedtime. 30 tablet 6   mometasone-formoterol (DULERA) 200-5 MCG/ACT AERO Inhale 2 puffs into the lungs in the morning and at bedtime. 3 each 2   montelukast (SINGULAIR) 10 MG tablet Take 1 tablet (10 mg total) by mouth at bedtime. 30 tablet 6   Multiple Vitamin (QUINTABS) TABS Take by mouth.     OVER THE COUNTER MEDICATION NeuroQ daily     oxybutynin (DITROPAN-XL) 10 MG 24 hr tablet Take 10 mg by mouth at bedtime.     pantoprazole (PROTONIX) 40 MG tablet Take by mouth.     tamsulosin (FLOMAX) 0.4 MG CAPS capsule Take 0.4 mg by mouth.     Tiotropium Bromide Monohydrate 2.5 MCG/ACT AERS Inhale 2 puffs into the lungs daily.     vitamin C (ASCORBIC ACID) 500 MG tablet Take by mouth.     No facility-administered medications prior to visit.    Review of Systems  Constitutional:  Negative for chills, diaphoresis, fever, malaise/fatigue and weight loss.  HENT:  Negative for congestion.   Respiratory:  Positive for shortness of breath. Negative for cough, hemoptysis, sputum production and wheezing.   Cardiovascular:  Negative for chest pain, palpitations and leg swelling.    Objective:   Vitals:   12/29/21 1124  BP: 118/68  Pulse: 87  SpO2: 96%  Weight: 69.5 kg  Height: 5' 10.5" (1.791 m)    SpO2: 96 % (RA) Physical Exam: General: Well-appearing, no acute distress HENT: [place], AT Eyes: EOMI, no scleral icterus Respiratory: Diminished breath sounds bilaterally.  No crackles, wheezing or rales Cardiovascular: RRR, -M/R/G, no JVD Extremities:-Edema,-tenderness Neuro: AAO x4, CNII-XII grossly intact Psych: Normal mood, normal affect  Data Reviewed:  Imaging: CXR 02/01/21 - Normal. No infiltrate, effusion or edema.  PFT: 12/29/21 FVC 2.24 (58%) FEV1 1.10 (38%) Ratio 47  TLC 98% RV 153% RV/TLC 173% DLCO 43% Interpretation: Severe obstructive lung defect with air  trapping and reduced DLCO consistent with emphysema.   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  He is symptomatic on triple therapy however may be exacerbated by his deconditioning.  PFTs reviewed today which demonstrated severe emphysema. Provided teaching on inhaler technique and use of spacer  COPD with emphysema --ORDER Spacer --CONTINUE Dulera 200-5 mcg TWO puffs TWICE a day. Use with spacer --CONTINUE Spiriva 2.5 mcg TWO puffs ONCE a day --CONTINUE Albuterol nebulizer AS NEEDED for shortness of breath or wheezing --CONTINUE Singulair 10 mg daily  Action Plan For worsening shortness of breath, wheezing and cough that do not improve in 24-48 hours, please our office for evaluation and/or prednisone taper.  Health Maintenance Immunization History  Administered Date(s) Administered   Fluad Quad(high Dose 65+) 04/29/2020, 05/10/2020, 05/02/2021   Influenza Split 05/31/2015   Influenza Whole 07/25/2007   Influenza, High Dose Seasonal PF 03/30/2013, 04/15/2013, 04/29/2017, 04/23/2018, 04/27/2019, 05/21/2019   Influenza,inj,Quad PF,6+ Mos 05/28/2016   Influenza-Unspecified 05/31/2007, 06/18/2008, 05/16/2009, 04/11/2010, 04/29/2010, 07/27/2011, 05/15/2012, 05/12/2014   Moderna Covid-19 Vaccine Bivalent Booster 36yr & up 07/26/2021   Moderna Sars-Covid-2 Vaccination 08/25/2019, 09/22/2019,  06/24/2020, 12/08/2020   Pneumococcal Conjugate-13 06/29/2014   Pneumococcal Polysaccharide-23 05/28/2013   Pneumococcal-Unspecified 05/06/2007   Rabies, IM 04/20/2020   Td,absorbed, Preservative Free, Adult Use, Lf Unspecified 04/17/2021   Tdap 09/07/2011   Zoster Recombinat (Shingrix) 12/11/2019, 02/16/2020   CT Lung Screen - not qualified. >15 years since quit  No orders of the defined types were placed in this encounter. No orders of the defined types were placed in this encounter.   Return in about 3 months (around 03/31/2022).  I have spent a total time of 32-minutes on the day of the appointment reviewing prior documentation, coordinating care and discussing medical diagnosis and plan with the patient/family. Past medical history, allergies, medications were reviewed. Pertinent imaging, labs and tests included in this note have been reviewed and interpreted independently by me.  CVilla Rica MD [facility] 12/29/2021 10:50 PM  Office Number [phone]

## 2021-12-29 NOTE — Progress Notes (Signed)
PFT done today. 

## 2022-01-09 ENCOUNTER — Encounter: Payer: Self-pay | Admitting: Diagnostic Neuroimaging

## 2022-01-09 ENCOUNTER — Ambulatory Visit (INDEPENDENT_AMBULATORY_CARE_PROVIDER_SITE_OTHER): Payer: Medicare Other | Admitting: Diagnostic Neuroimaging

## 2022-01-09 VITALS — BP 105/68 | HR 77 | Ht 70.5 in | Wt 155.0 lb

## 2022-01-09 DIAGNOSIS — R269 Unspecified abnormalities of gait and mobility: Secondary | ICD-10-CM

## 2022-01-09 DIAGNOSIS — G20 Parkinson's disease: Secondary | ICD-10-CM

## 2022-01-09 DIAGNOSIS — F419 Anxiety disorder, unspecified: Secondary | ICD-10-CM

## 2022-01-09 NOTE — Progress Notes (Signed)
[facility]  PATIENT: Rudolfo Brandow DOB: July 26, 1946  REFERRING CLINICIAN: [name], [name] Lacks, MD HISTORY FROM: PATIENT  REASON FOR VISIT: follow up   HISTORICAL  CHIEF COMPLAINT:  Chief Complaint  Patient presents with   Parkinson's disease    Rm 6, 6 month FU  "I don't like the way my Parkinson's is going; talked to [place] yesterday about getting OT; trying to eat better and exercise"     HISTORY OF PRESENT ILLNESS:   UPDATE (01/09/22, VRP): Since last visit, parkinson sxs worsening. Now off parkinson's meds. Trying supplements via  chiropractor from [place]. Trying to optimize nutrition and exercise.  UPDATE (07/05/21, VRP): Since last visit, doing about the same. Tremor in [place] in 2016, dx'd with PD. Carb/levo not helping that much. Not on rasagiline. More anxiety, insomnia, constipation, poor appetite, weight loss.  PRIOR HPI (02/28/21, [place]): Mr. Brooks is a 76 year old right-handed black male with a history of neck and low back pain.  The patient was seen through this office in 2016 with a right-sided tremor.  The patient since has been diagnosed with Parkinson's disease and is followed through the [facility] hospital for this, but he has had a lot of difficulty tolerating Sinemet and currently he is not on the medication.  He has had some difficulty walking since around 2018 or 2019, more recently he has had to use a cane to get around.  He used to do quite a bit of walking but he is no longer able to do this.  The patient has had some right-sided low back pain, he underwent an injection in the back on 19 January 2021.  Following the injection he had worsening pain that now is on the left and right side of the back, he has some discomfort into the right buttock and hip and into the right knee area.  He feels that his ability to ambulate has deteriorated, the right leg feels weaker.  The patient does have some tremor involving the right arm but not the right leg.  He notes that  his handwriting is sloppy.  He has noted some problems with swallowing at times, solids and liquids but denies drooling.  He apparently had a DaTscan that is consistent with parkinsonism.  He reports some numbness in the feet at this point and does have some problems with constipation.  He has occasional episodes of vertigo and some slight headache at times.  He reports no weakness in the arms.  Given the change in his ability to walk, he is sent to this office for further evaluation.    REVIEW OF SYSTEMS: Full 14 system review of systems performed and negative with exception of: as per HPI.  ALLERGIES: Allergies  Allergen Reactions   Acyclovir Swelling   Carbidopa-Levodopa     It made me angry   Enalapril Maleate Other (See Comments)    Reaction:  Unknown     HOME MEDICATIONS: Outpatient Medications Prior to Visit  Medication Sig Dispense Refill   albuterol (PROVENTIL HFA;VENTOLIN HFA) 108 (90 Base) MCG/ACT inhaler Inhale 2 puffs into the lungs every 4 (four) hours as needed for wheezing or shortness of breath. 1 Inhaler 0   Alum Hydroxide-Mag Carbonate 160-105 MG CHEW CHEW 1 TABLET BY MOUTH AS DIRECTED BY YOUR PROVIDER FOR STOMACH UPSET/DYSPEPSIA UP TO THREE TIMES DAILY     ascorbic acid (VITAMIN C) 500 MG tablet TAKE ONE TABLET BY MOUTH DAILY TO HELP WITH IRON STORES AND IMMUNE SYSTEM. PLEASE TAKE EVERY DAY  AND ESPECIALLY WHEN YOU TAKE FERROUS SULFATE.     BLACK PEPPER-TURMERIC PO Take by mouth daily. Mixes as a powder for back pain (ginger, cinnamon, black pepper, turmeric)     CINNAMON PO Mixes as a powder for back pain (ginger, cinnamon, black pepper, turmeric)     FEROSUL 325 (65 Fe) MG tablet Take by mouth.     fluticasone (FLONASE) 50 MCG/ACT nasal spray INSTILL 1 SPRAY IN EACH NOSTRIL TWICE A DAY FOR DIZZINESS     folic acid (FOLVITE) 1 MG tablet Take 1 mg by mouth daily.     Ginger, Zingiber officinalis, (GINGER PO) Mixes as a powder for back pain (ginger, cinnamon, black pepper,  turmeric)     mirtazapine (REMERON) 7.5 MG tablet Take 1 tablet (7.5 mg total) by mouth at bedtime. 30 tablet 6   mometasone-formoterol (DULERA) 200-5 MCG/ACT AERO Inhale 2 puffs into the lungs in the morning and at bedtime. 3 each 2   montelukast (SINGULAIR) 10 MG tablet Take 1 tablet (10 mg total) by mouth at bedtime. 30 tablet 6   Multiple Vitamin (QUINTABS) TABS Take by mouth.     OVER THE COUNTER MEDICATION NeuroQ daily     oxybutynin (DITROPAN-XL) 10 MG 24 hr tablet Take 10 mg by mouth at bedtime.     pantoprazole (PROTONIX) 40 MG tablet Take by mouth.     tamsulosin (FLOMAX) 0.4 MG CAPS capsule Take 0.4 mg by mouth.     Tiotropium Bromide Monohydrate 2.5 MCG/ACT AERS Inhale 2 puffs into the lungs daily.     vitamin C (ASCORBIC ACID) 500 MG tablet Take by mouth.     No facility-administered medications prior to visit.    PAST MEDICAL HISTORY: Past Medical History:  Diagnosis Date   Acid reflux    Alcohol abuse    quit 10 y ago   Arthritis    BPH (benign prostatic hyperplasia)    CAD (coronary artery disease)    Cervical spondylosis without myelopathy 06/02/2015   COPD (chronic obstructive pulmonary disease) (HCC)    Emphysema lung (HCC)    GERD (gastroesophageal reflux disease)    High cholesterol    Hypertension    Parkinson's disease ([place])    PVD (peripheral vascular disease) ([place])    Tremor 06/02/2015    PAST SURGICAL HISTORY: Past Surgical History:  Procedure Laterality Date   TONSILLECTOMY     VASECTOMY      FAMILY HISTORY: Family History  Problem Relation Age of Onset   Cancer Brother 51       prostate ca   Irregular heart beat Mother    Irregular heart beat Father    Tremor Father    Cancer Sister     SOCIAL HISTORY: Social History   Socioeconomic History   Marital status: Single    Spouse name: Not on file   Number of children: 0   Years of education: 14   Highest education level: Not on file  Occupational History   Occupation: retired   Tobacco Use   Smoking status: Former    Types: Cigarettes    Quit date: 06/09/2005    Years since quitting: 16.5   Smokeless tobacco: Never  Substance and Sexual Activity   Alcohol use: No    Comment: quit 10 years ago   Drug use: No   Sexual activity: Yes  Other Topics Concern   Not on file  Social History Narrative   01/09/22 Lives alone   Patient does not drink caffeine.  Patient is right handed.    Social Determinants of Health   Financial Resource Strain: Low Risk  (03/23/2021)   Overall Financial Resource Strain (CARDIA)    Difficulty of Paying Living Expenses: Not hard at all  Food Insecurity: No Food Insecurity (03/23/2021)   Hunger Vital Sign    Worried About Running Out of Food in the Last Year: Never true    [place] in the Last Year: Never true  Transportation Needs: No Transportation Needs (03/23/2021)   PRAPARE - Hydrologist (Medical): No    Lack of Transportation (Non-Medical): No  Physical Activity: Inactive (03/23/2021)   Exercise Vital Sign    Days of Exercise per Week: 0 days    Minutes of Exercise per Session: 0 min  Stress: No Stress Concern Present (03/23/2021)   [place]    Feeling of Stress : Not at all  Social Connections: Moderately Integrated (03/23/2021)   Social Connection and Isolation Panel [NHANES]    Frequency of Communication with Friends and Family: More than three times a week    Frequency of Social Gatherings with Friends and Family: Once a week    Attends Religious Services: More than 4 times per year    Active Member of [employer] or Organizations: Yes    Attends Archivist Meetings: More than 4 times per year    Marital Status: Divorced  Intimate Partner Violence: Not At Risk (03/23/2021)   Humiliation, Afraid, Rape, and Kick questionnaire    Fear of Current or Ex-Partner: No    Emotionally Abused: No    Physically Abused: No     Sexually Abused: No     PHYSICAL EXAM  GENERAL EXAM/CONSTITUTIONAL: Vitals:  Vitals:   01/09/22 1104  BP: 105/68  Pulse: 77  Weight: 155 lb (70.3 kg)  Height: 5' 10.5" (1.791 m)   Body mass index is 21.93 kg/m. Wt Readings from Last 3 Encounters:  01/09/22 155 lb (70.3 kg)  12/29/21 153 lb 3.2 oz (69.5 kg)  11/27/21 154 lb (69.9 kg)   Patient is in no distress; well developed, nourished and groomed; neck is supple  CARDIOVASCULAR: Examination of carotid arteries is normal; no carotid bruits Regular rate and rhythm, no murmurs Examination of peripheral vascular system by observation and palpation is normal  EYES: Ophthalmoscopic exam of optic discs and posterior segments is normal; no papilledema or hemorrhages No results found.  MUSCULOSKELETAL: Gait, strength, tone, movements noted in Neurologic exam below  NEUROLOGIC: MENTAL STATUS:      No data to display         awake, alert, oriented to person, place and time recent and remote memory intact normal attention and concentration language fluent, comprehension intact, naming intact fund of knowledge appropriate  CRANIAL NERVE:  2nd - no papilledema on fundoscopic exam 2nd, 3rd, 4th, 6th - pupils equal and reactive to light, visual fields full to confrontation, extraocular muscles intact, no nystagmus 5th - facial sensation symmetric 7th - facial strength symmetric 8th - hearing intact 9th - palate elevates symmetrically, uvula midline 11th - shoulder shrug symmetric 12th - tongue protrusion midline SLOW, HESITANT, STUTTERING SPEECH  MOTOR:  RARE INTERMITENT TREMOR IN RUE > LUE NORMAL TONE MOD BRADYKINESIA IN BUE AND BLE normal bulk and tone, full strength in the BUE, BLE  SENSORY:  normal and symmetric to light touch, temperature, vibration  COORDINATION:  finger-nose-finger, fine finger movements normal  REFLEXES:  deep tendon reflexes present and symmetric  GAIT/STATION:  SLOW  UNSTEADY GAIT; USING CANE    DIAGNOSTIC DATA (LABS, IMAGING, TESTING) - I reviewed patient records, labs, notes, testing and imaging myself where available.  Lab Results  Component Value Date   WBC 4.2 08/28/2021   HGB 13.7 08/28/2021   HCT 41.4 08/28/2021   MCV 99.7 08/28/2021   PLT 195.0 08/28/2021      Component Value Date/Time   NA 139 08/28/2021 1121   K 3.8 08/28/2021 1121   CL 103 08/28/2021 1121   CO2 28 08/28/2021 1121   GLUCOSE 101 (H) 08/28/2021 1121   BUN 20 08/28/2021 1121   CREATININE 1.06 08/28/2021 1121   CALCIUM 9.7 08/28/2021 1121   PROT 7.3 08/28/2021 1121   ALBUMIN 4.3 08/28/2021 1121   AST 20 08/28/2021 1121   ALT 19 08/28/2021 1121   ALKPHOS 56 08/28/2021 1121   BILITOT 0.6 08/28/2021 1121   GFRNONAA >60 01/05/2017 0250   GFRAA >60 01/05/2017 0250   Lab Results  Component Value Date   CHOL 225 (H) 02/01/2021   HDL 88.10 02/01/2021   LDLCALC 126 (H) 02/01/2021   TRIG 56.0 02/01/2021   CHOLHDL 3 02/01/2021   No results found for: "HGBA1C" Lab Results  Component Value Date   VITAMINB12 441 02/01/2021   Lab Results  Component Value Date   TSH 2.04 08/28/2021    10/12/20 datscan - Abnormal image grade 2: Significant bilateral reduction in putamen uptake with activity largely confined to the caudate nuclei.  - This pattern of activity can be seen with Parkinson's disease or related syndromes.    ASSESSMENT AND PLAN  76 y.o. year old male here with:   Dx:  1. Parkinson's disease ([place])   2. Gait abnormality   3. Anxiety      PLAN:  PARKINSON'S DISEASE - patient tried carb/evo and rasagiline in past, but not much benefit; patient doesn't want to be on PD meds for now - use cane; consider rollator walker - optimize nutrition, hydration, exercises  Return for return to PCP, pending if symptoms worsen or fail to improve.  I spent 15 minutes of face-to-face and non-face-to-face time with patient.  This included previsit chart  review, lab review, study review, order entry, electronic health record documentation, patient education.    Penni Bombard, MD 0/34/7425, 95:63 AM Certified in Neurology, Neurophysiology and Neuroimaging  [facility] [address], [place], [place] [address] [phone]

## 2022-01-09 NOTE — Patient Instructions (Addendum)
  PARKINSON'S DISEASE - patient tried carb/levo and rasagiline in past, but not much benefit; patient doesn't want to be on those meds for now - use cane; consider rollator walker - continue PT exercises

## 2022-02-20 NOTE — Therapy (Signed)
OUTPATIENT OCCUPATIONAL THERAPY PARKINSON'S EVALUATION  Patient Name: Lee [name] MRN: [id number] DOB:05-21-1946, 76 y.o., male Today's Date: 02/22/2022  PCP: Dr. Lew Dawes REFERRING PROVIDER: Dr. Landry Mellow   OT End of Session - 02/22/22 1252     Visit Number 1    Number of Visits 25    Date for OT Re-Evaluation 05/17/22    Authorization Type VA    Authorization Time Period through 06/01/22- most likely approved for 15 visits still need to check    Authorization - Visit Number 1    Authorization - Number of Visits 15    Progress Note Due on Visit 10    OT Start Time 1405    OT Stop Time 1445    OT Time Calculation (min) 40 min    Activity Tolerance Patient tolerated treatment well    Behavior During Therapy --   easily distracted             Past Medical History:  Diagnosis Date   Acid reflux    Alcohol abuse    quit 10 y ago   Arthritis    BPH (benign prostatic hyperplasia)    CAD (coronary artery disease)    Cervical spondylosis without myelopathy 06/02/2015   COPD (chronic obstructive pulmonary disease) ([place])    Emphysema lung ([place])    GERD (gastroesophageal reflux disease)    High cholesterol    Hypertension    Parkinson's disease ([place])    PVD (peripheral vascular disease) ([place])    Tremor 06/02/2015   Past Surgical History:  Procedure Laterality Date   TONSILLECTOMY     VASECTOMY     Patient Active Problem List   Diagnosis Date Noted   Acquired deformities of toe(s), unspecified, unspecified foot 11/27/2021   Asthma 11/27/2021   Cervicalgia 11/27/2021   Corns and callosities 11/27/2021   Disturbance of skin sensation 11/27/2021   Elevated prostate specific antigen (PSA) 11/27/2021   Excessive attrition of teeth 11/27/2021   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   Family history of cancer 11/27/2021   Family history of malignant neoplasm of prostate 11/27/2021   Hyperlipidemia 11/27/2021   Iron deficiency anemia, unspecified  11/27/2021   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[place]) 11/27/2021   Myalgia 11/27/2021   Nail dystrophy 11/27/2021   Benign neoplasm of other parts of mouth 11/27/2021   Open angle with borderline findings, high risk, left eye 11/27/2021   Other abnormal glucose 11/27/2021   Other reduced mobility 11/27/2021   Other secondary cataract, bilateral 11/27/2021   Pain in right thigh 11/27/2021   Pain in right knee 11/27/2021   Pain in unspecified shoulder 11/27/2021   Persons encountering health services in other specified circumstances 11/27/2021   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/27/2021   Right lower quadrant pain 11/27/2021   Tinnitus 11/27/2021   Trochanteric bursitis, right hip 11/27/2021   Unilateral primary osteoarthritis, right hip 11/27/2021   Unspecified fall, initial encounter 11/27/2021   Vitamin D deficiency, unspecified 11/27/2021   Weakness 11/27/2021   Constipation 08/28/2021   Chronic obstructive pulmonary disease ([place]) 05/31/2021   Depression 04/13/2021   Parkinson's disease ([place]) 02/28/2021   Weakness of both legs 02/01/2021   Dysphagia 02/01/2021   Weakness of right leg 01/06/2021   Lumbar radiculopathy 12/30/2020   Prostate cancer ([place]) 06/09/2020   Well adult exam 06/04/2019   BPH (benign prostatic hyperplasia) 06/04/2019   Bilateral carotid bruits 06/04/2019   Meniere disease, 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  Emphysema of lung ([place]) 06/04/2019   Spondylolisthesis at L4-L5 level 08/27/2017   Spinal stenosis in cervical region 08/27/2017   Tremor 06/02/2015   Cervical spondylosis without myelopathy 06/02/2015   Alcoholism in remission ([place]) 06/09/2014   Coronary arteriosclerosis 07/30/2009   History of colonoscopy 07/30/2009   Neutropenia ([place]) 07/30/2008   HIP PAIN 11/27/2007   Essential hypertension 07/25/2007   Atrial fibrillation ([place]) 07/25/2007   GERD 07/25/2007   ERECTILE  DYSFUNCTION 07/25/2007   LOW BACK PAIN 07/25/2007    ONSET DATE: 02/06/22-referral date  REFERRING DIAG: Parkinson's disease; Z74.09,Z78.9 (ICD-10-CM) - Impaired mobility and ADLs   THERAPY DIAG:  Other symptoms and signs involving the nervous system - Plan: Ot plan of care cert/re-cert  Abnormal posture - Plan: Ot plan of care cert/re-cert  Other symptoms and signs involving the musculoskeletal system - Plan: Ot plan of care cert/re-cert  Unsteadiness on feet - Plan: Ot plan of care cert/re-cert  Other lack of coordination - Plan: Ot plan of care cert/re-cert  Tremor - Plan: Ot plan of care cert/re-cert  Frontal lobe and executive function deficit - Plan: Ot plan of care cert/re-cert  SUBJECTIVE:   SUBJECTIVE STATEMENT: Pt reports pain in hip  Pt accompanied by: self  PERTINENT HISTORY:   Parkinson's disease--dx in March 2022 per pt report.    Other PMH:  neck and back pain, arthritis, hx of ETOH abuse, CAD, COPD, emphysema, GERD, HTN, high cholesterol, PVD, chronic vertigo per pt   PRECAUTIONS: Fall  WEIGHT BEARING RESTRICTIONS No  PAIN:  Are you having pain? No  FALLS: Has patient fallen in last 6 months? multiple "lost count"   pt reports that he falls daily, but can usually catch himself--bumps and bruises  LIVING ENVIRONMENT: Lives with: lives alone.   Lives in: House/apartment Stairs: Yes: Internal: ) steps; none and External: 3 steps; can reach both Has following equipment at home: Single point cane  PLOF: Independent  PATIENT GOALS move better, stay independent  OBJECTIVE:   HAND DOMINANCE: Right  ADLs:  Transfers/ambulation related to ADLs: Eating:  mod I  Grooming: difficulty trimming facial hair  UB Dressing: mod I, but difficulty/fatigue/incr time LB Dressing: difficulty/fatigue/incr time, difficulty with buttons Toileting: raised seat, mod I Bathing: mod I Tub Shower transfers: has tub with grab bar (difficulty), takes a bath Equipment:  Grab bars   IADLs: Light housekeeping: sister helps, incr time/difficulty Meal Prep: rarely uses knife, difficulty screwing lid on bottle Community mobility: independent,decreased reaction time  Medication management: mod I Financial management: mod I Handwriting: pt demonstrates legible handwriting with mild micrographia  MOBILITY STATUS: Hx of falls, Freezing, Festination, difficulty with turns, start hesitation, and "R foot stuck to ground ".  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   POSTURE COMMENTS:  rounded shoulders  ACTIVITY TOLERANCE: Activity tolerance: fatigue with dressing  FUNCTIONAL OUTCOME MEASURES: Standing functional reach: Right: NT inches; Left: NT inches Fastening/unfastening 3 buttons: 35.41 Physical performance test: PPT#2 (simulated eating) 11.44sec, holds spoon at end & PPT#4 (donning/doffing jacket): 15.88sec   COORDINATION: 9 Hole Peg test: Right: 29.13 sec; Left: 32.38 sec Box and Blocks:  Right 53blocks, Left 49blocks Tremors: Resting and Right  UE ROM:   R shoulder flex 140* with -5* elbow ext, L shoulder flex 140* -5 elbow extension  UE MMT:   Not tested  SENSATION: Not tested    MUSCLE TONE: RUE: Mild and Rigidity and LUE: Within functional limits  COGNITION: Overall cognitive status: Impaired: per pt report, will assess further in  functional context prn .  Short term memory deficits  OBSERVATIONS: Bradykinesia and Hypokinesia   TODAY'S TREATMENT:  See pt education   PATIENT EDUCATION: Education details: role of OT Person educated: Patient Education method: Explanation Education comprehension: verbalized understanding   HOME EXERCISE PROGRAM: Not yet issued  ASSESSMENT:  CLINICAL IMPRESSION: Patient is a 76 y.o. male who was seen today for occupational therapy evaluation for Parkinson's Disease.  Pt participated in Parkinson's evaluation in May, however he did not have VA approval at that time. Pt returns to OT now that he has [place]  approval .Pt reports that he was diagnosed 09/2020 and has no prior occupational therapy.  Pt reports that he quit taking one of his meds.  Pt also with PMH that includes:  neck and back pain, PD -dx in March 2022 per pt report, hx of ETOH abuse, CAD, COPD, emphysema, GERD, HTN, high cholesterol, PVD.  Pt presents today with bradykinesia, rigidity, decr coordination, decr ROM, decr posture, tremor, and decr balance/functional mobility.  Pt would benefit from occupational therapy to improve ease and safety with ADLs/IADLs, decr risk for future complications, and establish PD-specific HEP.  PERFORMANCE DEFICITS in functional skills including ADLs, IADLs, coordination, dexterity, tone, ROM, FMC, GMC, mobility, balance, decreased knowledge of precautions, decreased knowledge of use of DME, and UE functional use, cognitive skills including attention and memory, and psychosocial skills including habits and routines and behaviors.   IMPAIRMENTS are limiting patient from ADLs and IADLs.   COMORBIDITIES has co-morbidities such as CAD, COPD, HTN, hx of neck and back pain  that affects occupational performance. Patient will benefit from skilled OT to address above impairments and improve overall function.  MODIFICATION OR ASSISTANCE TO COMPLETE EVALUATION: Min-Moderate modification of tasks or assist with assess necessary to complete an evaluation.  OT OCCUPATIONAL PROFILE AND HISTORY: Detailed assessment: Review of records and additional review of physical, cognitive, psychosocial history related to current functional performance.  CLINICAL DECISION MAKING: Moderate - several treatment options, min-mod task modification necessary  REHAB POTENTIAL: Good  EVALUATION COMPLEXITY: Moderate     GOALS: Potential Goals reviewed with patient? Yes  SHORT TERM GOALS: Target date: 01/04/2022  Pt will be independent with PD-specific HEP.   Goal status: INITIAL  2.  Pt will verbalize understanding of ways to decr  risk of future complications and appropriate community resources.  Goal status: INITIAL  3.  Baseline:Pt will report incr ease with grooming tasks and ability to use only RUE.  Goal status: INITIAL  4.  Pt will demonstrate improved ease with fastening buttons as evidenced by decreasing 3 button/ unbutton time to:33 secs or less    Baseline: 35.41 Goal status: INITIAL  5 Pt will demonstrate improved LUE functional use as evidenced by increasing box/ blocks score by 3 blocks.  Baseline: R 53, L 49  LONG TERM GOALS: Target date: 05/17/2022  Pt will verbalize understanding of adaptive strategies/AE to incr ease and safety with ADLs/IADLs.(Cutting food, opening containers)  Goal status: INITIAL  2.  Pt will demonstrate increased ease with dressing as eveidenced by decreasing PPT#4(don/ doff jacket) to 13 secs or less  Baseline: 15.88sec Goal status: INITIAL  3.  Pt will be able to write at least 3 sentences with good legibility and only  no significant decr. In letter size Goal status: INITIAL   4.  Assess standing functional reach and set goal as appropriate. Baseline: Not tested due to time constraints Goal status: INITIAL   PLAN: OT FREQUENCY: 2x/week  OT DURATION: 12 weeks, may modify due to scheduling/progress  PLANNED INTERVENTIONS: self care/ADL training, therapeutic exercise, therapeutic activity, neuromuscular re-education, manual therapy, passive range of motion, balance training, functional mobility training, aquatic therapy, ultrasound, paraffin, fluidotherapy, moist heat, cryotherapy, patient/family education, cognitive remediation/compensation, visual/perceptual remediation/compensation, energy conservation, coping strategies training, and DME and/or AE instructions  RECOMMENDED OTHER SERVICES: scheduled for evals with PT and ST  CONSULTED AND AGREED WITH PLAN OF CARE: Patient  PLAN FOR NEXT SESSION: assess standing functional reach  initiate HEP (PWR! In  sitting, PWR! Hands)   Lee [name], OT 02/22/2022, 1:14 PM  [facility] [place], [place]  [id number] [phone] phone [phone] 02/22/22 1:14 PM

## 2022-02-21 ENCOUNTER — Ambulatory Visit: Payer: No Typology Code available for payment source | Admitting: Physical Therapy

## 2022-02-21 ENCOUNTER — Other Ambulatory Visit: Payer: Self-pay

## 2022-02-21 ENCOUNTER — Encounter: Payer: Self-pay | Admitting: Physical Therapy

## 2022-02-21 ENCOUNTER — Ambulatory Visit: Payer: No Typology Code available for payment source | Admitting: Speech Pathology

## 2022-02-21 ENCOUNTER — Ambulatory Visit: Payer: No Typology Code available for payment source | Attending: Internal Medicine | Admitting: Occupational Therapy

## 2022-02-21 ENCOUNTER — Encounter: Payer: Self-pay | Admitting: Speech Pathology

## 2022-02-21 DIAGNOSIS — R278 Other lack of coordination: Secondary | ICD-10-CM | POA: Insufficient documentation

## 2022-02-21 DIAGNOSIS — M6281 Muscle weakness (generalized): Secondary | ICD-10-CM | POA: Insufficient documentation

## 2022-02-21 DIAGNOSIS — R1312 Dysphagia, oropharyngeal phase: Secondary | ICD-10-CM

## 2022-02-21 DIAGNOSIS — R2681 Unsteadiness on feet: Secondary | ICD-10-CM | POA: Diagnosis not present

## 2022-02-21 DIAGNOSIS — R29818 Other symptoms and signs involving the nervous system: Secondary | ICD-10-CM

## 2022-02-21 DIAGNOSIS — R251 Tremor, unspecified: Secondary | ICD-10-CM | POA: Insufficient documentation

## 2022-02-21 DIAGNOSIS — R471 Dysarthria and anarthria: Secondary | ICD-10-CM | POA: Insufficient documentation

## 2022-02-21 DIAGNOSIS — R29898 Other symptoms and signs involving the musculoskeletal system: Secondary | ICD-10-CM | POA: Insufficient documentation

## 2022-02-21 DIAGNOSIS — R1310 Dysphagia, unspecified: Secondary | ICD-10-CM | POA: Insufficient documentation

## 2022-02-21 DIAGNOSIS — R41844 Frontal lobe and executive function deficit: Secondary | ICD-10-CM | POA: Diagnosis present

## 2022-02-21 DIAGNOSIS — R293 Abnormal posture: Secondary | ICD-10-CM | POA: Insufficient documentation

## 2022-02-21 DIAGNOSIS — R41841 Cognitive communication deficit: Secondary | ICD-10-CM | POA: Insufficient documentation

## 2022-02-21 NOTE — Therapy (Deleted)
op

## 2022-02-21 NOTE — Therapy (Signed)
OUTPATIENT PHYSICAL THERAPY NEURO EVALUATION   Patient Name: Lee [name] MRN: [id number] DOB:11-03-1945, 76 y.o., male Today's Date: 02/21/2022   PCP: Cassandria Anger, MD REFERRING PROVIDER: Harvie Heck, MD    PT End of Session - 02/21/22 1601     Visit Number 1    Number of Visits 16    Date for PT Re-Evaluation 05/22/22    Authorization Type VA - 15 visits approved from 12/29/21 - 04/28/22    Authorization - Number of Visits 15    PT Start Time 1445    PT Stop Time 1530    PT Time Calculation (min) 45 min    Equipment Utilized During Treatment Gait belt    Activity Tolerance Patient tolerated treatment well    Behavior During Therapy --   easily distracted            Past Medical History:  Diagnosis Date   Acid reflux    Alcohol abuse    quit 10 y ago   Arthritis    BPH (benign prostatic hyperplasia)    CAD (coronary artery disease)    Cervical spondylosis without myelopathy 06/02/2015   COPD (chronic obstructive pulmonary disease) ([place])    Emphysema lung ([place])    GERD (gastroesophageal reflux disease)    High cholesterol    Hypertension    Parkinson's disease ([place])    PVD (peripheral vascular disease) ([place])    Tremor 06/02/2015   Past Surgical History:  Procedure Laterality Date   TONSILLECTOMY     VASECTOMY     Patient Active Problem List   Diagnosis Date Noted   Acquired deformities of toe(s), unspecified, unspecified foot 11/27/2021   Asthma 11/27/2021   Cervicalgia 11/27/2021   Corns and callosities 11/27/2021   Disturbance of skin sensation 11/27/2021   Elevated prostate specific antigen (PSA) 11/27/2021   Excessive attrition of teeth 11/27/2021   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   Family history of cancer 11/27/2021   Family history of malignant neoplasm of prostate 11/27/2021   Hyperlipidemia 11/27/2021   Iron deficiency anemia, unspecified 11/27/2021   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[place]) 11/27/2021   Myalgia  11/27/2021   Nail dystrophy 11/27/2021   Benign neoplasm of other parts of mouth 11/27/2021   Open angle with borderline findings, high risk, left eye 11/27/2021   Other abnormal glucose 11/27/2021   Other reduced mobility 11/27/2021   Other secondary cataract, bilateral 11/27/2021   Pain in right thigh 11/27/2021   Pain in right knee 11/27/2021   Pain in unspecified shoulder 11/27/2021   Persons encountering health services in other specified circumstances 11/27/2021   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/27/2021   Right lower quadrant pain 11/27/2021   Tinnitus 11/27/2021   Trochanteric bursitis, right hip 11/27/2021   Unilateral primary osteoarthritis, right hip 11/27/2021   Unspecified fall, initial encounter 11/27/2021   Vitamin D deficiency, unspecified 11/27/2021   Weakness 11/27/2021   Constipation 08/28/2021   Chronic obstructive pulmonary disease ([place]) 05/31/2021   Depression 04/13/2021   Parkinson's disease ([place]) 02/28/2021   Weakness of both legs 02/01/2021   Dysphagia 02/01/2021   Weakness of right leg 01/06/2021   Lumbar radiculopathy 12/30/2020   Prostate cancer ([place]) 06/09/2020   Well adult exam 06/04/2019   BPH (benign prostatic hyperplasia) 06/04/2019   Bilateral carotid bruits 06/04/2019   Meniere disease, 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   Emphysema of lung ([place]) 06/04/2019   Spondylolisthesis at L4-L5 level 08/27/2017  Spinal stenosis in cervical region 08/27/2017   Tremor 06/02/2015   Cervical spondylosis without myelopathy 06/02/2015   Alcoholism in remission ([place]) 06/09/2014   Coronary arteriosclerosis 07/30/2009   History of colonoscopy 07/30/2009   Neutropenia ([place]) 07/30/2008   HIP PAIN 11/27/2007   Essential hypertension 07/25/2007   Atrial fibrillation ([place]) 07/25/2007   GERD 07/25/2007   ERECTILE DYSFUNCTION 07/25/2007   LOW BACK PAIN 07/25/2007    ONSET DATE: 12/29/2021    REFERRING DIAG: G20 (ICD-10-CM) - Parkinson disease ([place])   THERAPY DIAG:  Other symptoms and signs involving the nervous system  Abnormal posture  Unsteadiness on feet  Muscle weakness (generalized)  Rationale for Evaluation and Treatment Rehabilitation  SUBJECTIVE:                                                                                                                                                                                              SUBJECTIVE STATEMENT: Reports he is not making progress, feels like he gets tired more easily. Reports he is always almost falling, but is able to catch himself - is just moving. The more that he uses the RLE, the worse it gets. It is dragging at the end of the day. Using his SPC all the time. Physician has recommended a walker, but pt does not want to be dependent on that. Reports he is always exercising. Still working on exercises from last bout of therapy. Started back on some PD meds about 1.5 months ago.   Pt accompanied by: self  PERTINENT HISTORY: PMH:  neck and back pain, PD -dx in March 2022 per pt report, hx of ETOH abuse, CAD, COPD, emphysema, GERD, HTN, high cholesterol, PVD, pt with hx of being non-compliant with meds  PAIN:  Are you having pain? Yes: NPRS scale: 3-5/10 Pain location: RLE - R knee/quad, R hip Pain description: Dull Aggravating factors: Depends how his body is like, pressure Relieving factors: Sitting up taller, tumeric  PRECAUTIONS: Fall   FALLS: Has patient fallen in last 6 months?  No falls to the ground, but stumbles all the time, can't keep count, normally can catch himself.   LIVING ENVIRONMENT: Lives with: lives alone Lives in: House/apartment Stairs: Yes: External: 3 steps; on right going up; one level  Has following equipment at home: Single point cane, Grab bars, and Has a tub shower.   PLOF: Independent with community mobility with device  Reports sister comes over to help with  cleaning.   PATIENT GOALS Wants to get rid of the PD and go back to where he was 2-3 years ago. [place]   OBJECTIVE:   COGNITION: Overall cognitive status: Impaired   SENSATION: Light touch: Reports tingling distal RLE   COORDINATION: Heel to shin: impaired bilat, more difficulty with RLE due to pain/bradykinesia, limited ROM    MUSCLE TONE: RLE: Rigidity    POSTURE: rounded shoulders and forward head    LOWER EXTREMITY MMT:    MMT Right Eval Left Eval  Hip flexion 3- 4+  Hip extension    Hip abduction    Hip adduction    Hip internal rotation    Hip external rotation    Knee flexion 4+ 4+  Knee extension 4+ 4+  Ankle dorsiflexion 5 5  Ankle plantarflexion    Ankle inversion    Ankle eversion    (Blank rows = not tested)  BED MOBILITY:  Reports having difficulty getting in and out of the bed.   TRANSFERS: Assistive device utilized: None  Sit to stand: SBA and CGA Stand to sit: SBA and CGA  SBA with BUE support, CGA when pt tried without UE support due to retropulsion.    GAIT: Gait pattern: step through pattern, decreased arm swing- Right, decreased arm swing- Left, decreased step length- Right, decreased step length- Left, decreased stride length, shuffling, festinating, and narrow BOS Distance walked: Clinic distances Assistive device utilized: Single point cane Level of assistance: SBA and CGA Comments: Pt needing min guard/CGA at times when turning, CGA with no AD during TUG due to freezing/festination episodes.   FUNCTIONAL TESTs:  5 times sit to stand: 26.57 seconds with BUE support  10 meter walk test: 14.96 seconds with SPC = 2.19 ft/sec   [facility] PT Assessment - 02/21/22 1528       Standardized Balance Assessment   Standardized Balance Assessment Timed Up and Go Test      Timed Up and Go Test   Normal TUG (seconds) 11.94   with SPC, 11.75 with no AD and min guard, freezing/festination with turns   Manual TUG  (seconds) 14.84   with no AD, initiation freezing   Cognitive TUG (seconds) 22.38   with SPC, shuffling/freezing when turning             TODAY'S TREATMENT:  N/A during eval.   PATIENT EDUCATION: Education details: Clinical findings, POC, importance of taking PD medication consistently, provided education about PD and how is is a progressive disease and importance of exercise (pt refuses to believe that it is progressive) Person educated: Patient Education method: Explanation Education comprehension: verbalized understanding   HOME EXERCISE PROGRAM: Will review from previous bout of therapy and update as appropriate.   [id number], Seated and Standing PWR moves     GOALS: Goals reviewed with patient? Yes  SHORT TERM GOALS: Target date: 03/21/2022  Pt will be independent with initial HEP for improved strength, balance, transfers, and gait.  Baseline: Goal status: INITIAL  2.  Pt will verbalize tips to reduce festination/freezing with gait and turns.  Baseline:  Goal status: INITIAL  3.  Pt will verbalize understanding of local Parkinson's disease resources.  Baseline:  Goal status: INITIAL  4.  Push and release test to be assessed with LTG written for improved balance recovery. Baseline:  Goal status: INITIAL  5.   Pt will improve 5x sit<>stand with BUE support to less than or equal to 22 sec to demonstrate improved functional strength and transfer efficiency.  Baseline: 26.57 seconds with BUE support  Goal status: INITIAL    LONG TERM GOALS: Target date: 04/18/2022  Pt will be independent with final HEP for improved strength, balance, transfers, and gait.  Baseline:  Goal status: INITIAL  2.   Pt will improve gait velocity to at least 2.4 ft/sec with LRAD for improved gait efficiency and safety.   Baseline: 14.96 seconds with SPC = 2.19 ft/sec Goal status: INITIAL  3.   Pt will ambulate at least 500' ft indoor and outdoor surfaces, appropriate assistive  device with supervision for imrpoved community gait.   Baseline:  Goal status: INITIAL  4.  Push and release test goal to be assessed and goal written for improved balance recovery. Baseline:  Goal status: INITIAL  5.  Pt will improve 5x sit<>stand with BUE support to less than or equal to 22 sec to demonstrate improved functional strength and transfer efficiency. Baseline: 26.57 seconds with BUE support  Goal status: INITIAL  6.  Pt will improve cog TUG to 17 seconds or less in order to demo improved dual tasking/decr fall risk.  Baseline: 22.38 sec with SPC  Goal status: INITIAL    ASSESSMENT:  CLINICAL IMPRESSION: Patient is a 76 year old male referred to Neuro OPPT for PD.   Pt had PD screens on 11/21/21 and PD eval was recommended due to slowing of outcome measures. The following deficits were present during the exam: bradykinesia, decr timing/coordination of gait, impaired balance, RLE pain, decr strength, postural abnormalities. Pt demonstrating freezing/festination during gait with no AD and with dual tasking with turns. Based on 5x sit <> stand, cog/manual TUG  pt is an incr risk for falls. Pt's gait speed indicates that pt is a limited community ambulator. Pt would benefit from skilled PT to address these impairments and functional limitations to maximize functional mobility independence and decr fall risk.    OBJECTIVE IMPAIRMENTS Abnormal gait, decreased activity tolerance, decreased balance, decreased cognition, decreased coordination, decreased endurance, decreased knowledge of use of DME, difficulty walking, decreased strength, decreased safety awareness, impaired flexibility, impaired sensation, impaired tone, postural dysfunction, and pain.   ACTIVITY LIMITATIONS stairs, transfers, bed mobility, and locomotion level  PARTICIPATION LIMITATIONS: cleaning and community activity  PERSONAL FACTORS Behavior pattern, Past/current experiences, Time since onset of  injury/illness/exacerbation, and 3+ comorbidities: neck and back pain, PD -dx in March 2022 per pt report, hx of ETOH abuse, CAD, COPD, emphysema, GERD, HTN, high cholesterol, PVD  are also affecting patient's functional outcome.   REHAB POTENTIAL: Good  CLINICAL DECISION MAKING: Evolving/moderate complexity  EVALUATION COMPLEXITY: Moderate  PLAN: PT FREQUENCY: 2x/week  PT DURATION: 12 weeks  PLANNED INTERVENTIONS: Therapeutic exercises, Therapeutic activity, Neuromuscular re-education, Balance training, Gait training, Patient/Family education, Self Care, Stair training, DME instructions, and Re-evaluation  PLAN FOR NEXT SESSION: perform push and release test and write goal. Review previous HEP.    Arliss Journey, PT, DPT  02/21/2022, 4:03 PM

## 2022-02-21 NOTE — Therapy (Signed)
OUTPATIENT SPEECH LANGUAGE PATHOLOGY PARKINSON'S EVALUATION   Patient Name: Lee [name] MRN: [id number] DOB:April 22, 1946, 76 y.o., male Today's Date: 02/21/2022  PCP:  Dr. Lew Dawes  REFERRING PROVIDER: Harvie Heck, MD    End of Session - 02/21/22 1545     Visit Number 1    Number of Visits 25    Date for SLP Re-Evaluation 05/16/22    SLP Start Time 1315    SLP Stop Time  1400    SLP Time Calculation (min) 45 min    Activity Tolerance Patient tolerated treatment well             Past Medical History:  Diagnosis Date   Acid reflux    Alcohol abuse    quit 10 y ago   Arthritis    BPH (benign prostatic hyperplasia)    CAD (coronary artery disease)    Cervical spondylosis without myelopathy 06/02/2015   COPD (chronic obstructive pulmonary disease) ([place])    Emphysema lung ([place])    GERD (gastroesophageal reflux disease)    High cholesterol    Hypertension    Parkinson's disease ([place])    PVD (peripheral vascular disease) ([place])    Tremor 06/02/2015   Past Surgical History:  Procedure Laterality Date   TONSILLECTOMY     VASECTOMY     Patient Active Problem List   Diagnosis Date Noted   Acquired deformities of toe(s), unspecified, unspecified foot 11/27/2021   Asthma 11/27/2021   Cervicalgia 11/27/2021   Corns and callosities 11/27/2021   Disturbance of skin sensation 11/27/2021   Elevated prostate specific antigen (PSA) 11/27/2021   Excessive attrition of teeth 11/27/2021   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   Family history of cancer 11/27/2021   Family history of malignant neoplasm of prostate 11/27/2021   Hyperlipidemia 11/27/2021   Iron deficiency anemia, unspecified 11/27/2021   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[place]) 11/27/2021   Myalgia 11/27/2021   Nail dystrophy 11/27/2021   Benign neoplasm of other parts of mouth 11/27/2021   Open angle with borderline findings, high risk, left eye 11/27/2021   Other abnormal glucose  11/27/2021   Other reduced mobility 11/27/2021   Other secondary cataract, bilateral 11/27/2021   Pain in right thigh 11/27/2021   Pain in right knee 11/27/2021   Pain in unspecified shoulder 11/27/2021   Persons encountering health services in other specified circumstances 11/27/2021   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/27/2021   Right lower quadrant pain 11/27/2021   Tinnitus 11/27/2021   Trochanteric bursitis, right hip 11/27/2021   Unilateral primary osteoarthritis, right hip 11/27/2021   Unspecified fall, initial encounter 11/27/2021   Vitamin D deficiency, unspecified 11/27/2021   Weakness 11/27/2021   Constipation 08/28/2021   Chronic obstructive pulmonary disease ([place]) 05/31/2021   Depression 04/13/2021   Parkinson's disease ([place]) 02/28/2021   Weakness of both legs 02/01/2021   Dysphagia 02/01/2021   Weakness of right leg 01/06/2021   Lumbar radiculopathy 12/30/2020   Prostate cancer ([place]) 06/09/2020   Well adult exam 06/04/2019   BPH (benign prostatic hyperplasia) 06/04/2019   Bilateral carotid bruits 06/04/2019   Meniere disease, 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10 (ICD-10-CM) - Dysphagia   THERAPY DIAG:  Dysarthria and anarthria  Dysphagia, oropharyngeal phase  Rationale for Evaluation and Treatment  Rehabilitation  SUBJECTIVE:   SUBJECTIVE STATEMENT: "I have trouble being heard" Pt accompanied by: self  PERTINENT HISTORY: Patient is a 76 y.o. male who was seen today for occupational therapy evaluation for Parkinson's Disease.  Pt participated in [place] on 11/21/21 and OT, PT, and ST was recommended due to functional decline.  Pt reports that he was diagnosed 09/2020 and has no prior occupational therapy.  Pt reports that he quit taking one of his meds.  Pt also with PMH that includes:  neck and back pain, PD -dx in March 2022 per pt report, hx of ETOH abuse, CAD, COPD, emphysema, GERD, HTN, high cholesterol, PVD.   PAIN:  Are you having pain? Yes: NPRS scale: 4/10 Pain location: leg right Pain description: ache Aggravating factors: walking, arthritis Relieving factors: rest, topicals  FALLS: Has patient fallen in last 6 months?  See PT evaluation for details  LIVING ENVIRONMENT: Lives with: lives alone Lives in: House/apartment  PLOF:  Level of assistance: Independent with ADLs, Independent with IADLs Employment: Retired  PATIENT GOALS "To talk better and be understood"  OBJECTIVE:    COGNITION: Overall cognitive status: Within functional limits for tasks assessed Areas of impairment: Attention and Memory   MOTOR SPEECH: Overall motor speech: impaired Level of impairment: Sentence Respiration: thoracic breathing Phonation: normal Resonance: WFL Articulation: Impaired: conversation Intelligibility: Intelligibility reduced Motor planning: Appears intact Motor speech errors: aware Dysfluencies noted 8x during conversation - initial sound repetitions - Effective technique: slow rate, increased vocal intensity, and over articulate  ORAL MOTOR EXAMINATION Overall status: WFL    OBJECTIVE VOICE ASSESSMENT: Sustained "ah" maximum phonation time: 6 seconds Sustained "ah" loudness average: 84 dB Oral reading (passage) loudness average: 72 dB Oral  reading loudness range: 69-74 dB Conversational loudness average: 64 dB Conversational loudness range: 62-67 dB Communicative Effectiveness Survey: Stimulability trials: Given SLP modeling and usual min cues, loudness average increased to 89dB (range of 87-93 t at loud "ah",   69 to 74 dB paragraph, level.     Pt does report difficulty with swallowing which does warrant further evaluation.  PATIENT REPORTED OUTCOME MEASURES (PROM): Communication Effectiveness Survey: 13/32  TODAY'S TREATMENT:  Initiated training in HEP for dysarthria and compensations for dysfluency. Educated pt re: recommendation for MBSS. Loud /a/ and oral reading with occasional min to mod A. Provided information on Speak Out and how to order the Speak out work book   PATIENT EDUCATION: Education details: See Skilled Treatment Person educated: Patient Education method: Consulting civil engineer, Media planner, Verbal cues, and Handouts Education comprehension: verbalized understanding, returned demonstration, verbal cues required, and needs further education   HOME EXERCISE PROGRAM: Speak Out   GOALS: Goals reviewed with patient? Yes  SHORT TERM GOALS: Target date: 03/26/22 Pt will complete HEP for dysarthria with occasional min A over 2 sessions Baseline: Goal status: INITIAL  2.  Pt will average 70dB 18/20 sentences with rare min A over 2 sessions Baseline:  Goal status: INITIAL  3.  Pt will average 70dB over 8 minute simple conversation with occasional min A over 2 sessions Baseline:  Goal status: INITIAL  4.  Pt will complete  MBSS Baseline:  Goal status: INITIAL  5.  Pt will follow diet modifications and swallow precautions after MBSS with occasional min A Baseline:  Goal status: INITIAL   LONG TERM GOALS: Target date: 05/16/22  Pt will complete HEP for dysarthria with rare min A over 2 sessions Baseline:  Goal status: INITIAL  2.  Pt will complete HEP for dysphagia if indicated s/p MBSS with  occasional min A Baseline:  Goal status: INITIAL  3.  Pt will average 70 dB during 15 minute conversation with rare min A over 2 sessions Baseline:  Goal status: INITIAL  4.  Pt will improve score on Communicative Effectiveness Survey Baseline: 13/32 Goal status: INITIAL   ASSESSMENT:  CLINICAL IMPRESSION: Patient is a 76 y.o. male who was seen today for dysphagia and dysarthria. 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 He rated his speech as "not at all effective" (1) for conversing in noisy place, speaking when upset or angry, conversing across a room. He rated a 2, limited effectiveness talking with family and friends, talking on the phone, talking in a car. He reports frustration with communication when asked to repeat himself. Stuttering also noted in conversation with initial sound repetitions. He endorses slowing down improves this. Conversation volume is low at 62-67dB. He endorses dysphagia. Recommend MBSS. Recommend skilled ST to maximize intelligibility for safety, QOL and safety of swallow.   OBJECTIVE IMPAIRMENTS  Objective impairments include memory and dysarthria. These impairments are limiting patient from effectively communicating at home and in community and safety when swallowing.Factors affecting potential to achieve goals and functional outcome are medical prognosis.. Patient will benefit from skilled SLP services to address above impairments and improve overall function.  REHAB POTENTIAL: Good  PLAN: SLP FREQUENCY: 2x/week  SLP DURATION: 12 weeks  PLANNED INTERVENTIONS: Aspiration precaution training, Pharyngeal strengthening exercises, Diet toleration management , Language facilitation, Environmental controls, Trials of upgraded texture/liquids, Cueing hierachy, Cognitive reorganization, Internal/external aids, Functional tasks, Multimodal communication approach, and SLP instruction and feedback    [place], Annye Rusk, [place] 02/21/2022, 3:46  PM

## 2022-02-21 NOTE — Patient Instructions (Addendum)
   Loud Ah! As loud and and long as you can without straining your voice  Count 1-20 with big breath and loud voice  Read aloud 5 minute focusing on volume

## 2022-02-22 ENCOUNTER — Other Ambulatory Visit (HOSPITAL_COMMUNITY): Payer: Self-pay

## 2022-02-22 DIAGNOSIS — R1310 Dysphagia, unspecified: Secondary | ICD-10-CM

## 2022-02-23 ENCOUNTER — Ambulatory Visit: Payer: No Typology Code available for payment source

## 2022-02-23 ENCOUNTER — Ambulatory Visit: Payer: No Typology Code available for payment source | Admitting: Physical Therapy

## 2022-02-23 ENCOUNTER — Encounter: Payer: Self-pay | Admitting: Physical Therapy

## 2022-02-23 ENCOUNTER — Telehealth: Payer: Self-pay | Admitting: Physical Therapy

## 2022-02-23 DIAGNOSIS — M6281 Muscle weakness (generalized): Secondary | ICD-10-CM | POA: Diagnosis not present

## 2022-02-23 DIAGNOSIS — R1312 Dysphagia, oropharyngeal phase: Secondary | ICD-10-CM | POA: Diagnosis not present

## 2022-02-23 DIAGNOSIS — R29818 Other symptoms and signs involving the nervous system: Secondary | ICD-10-CM | POA: Diagnosis not present

## 2022-02-23 DIAGNOSIS — R251 Tremor, unspecified: Secondary | ICD-10-CM | POA: Diagnosis not present

## 2022-02-23 DIAGNOSIS — R1310 Dysphagia, unspecified: Secondary | ICD-10-CM

## 2022-02-23 DIAGNOSIS — R471 Dysarthria and anarthria: Secondary | ICD-10-CM

## 2022-02-23 DIAGNOSIS — R293 Abnormal posture: Secondary | ICD-10-CM | POA: Diagnosis not present

## 2022-02-23 DIAGNOSIS — R2681 Unsteadiness on feet: Secondary | ICD-10-CM

## 2022-02-23 DIAGNOSIS — R29898 Other symptoms and signs involving the musculoskeletal system: Secondary | ICD-10-CM | POA: Diagnosis not present

## 2022-02-23 DIAGNOSIS — R41841 Cognitive communication deficit: Secondary | ICD-10-CM

## 2022-02-23 DIAGNOSIS — R278 Other lack of coordination: Secondary | ICD-10-CM | POA: Diagnosis not present

## 2022-02-23 NOTE — Telephone Encounter (Signed)
Dr. Leta Baptist,  I have been working with your patient Lee [name] in PT. I just wanted to make you aware that he has been beginning to take the Carbidopa-Levodopa again (2 tablets 3 times a day) that you prescribed to him back in December. I wanted to make you aware and I asked him to let you know that he was planning on re-starting this. I was wondering what specific times you want him to take it throughout the day.  Thank you, Janann August, PT, DPT 02/23/22 3:15 PM    [facility] [address] [place], [place]  [address] Phone:  [phone] Fax:  [phone]

## 2022-02-23 NOTE — Therapy (Signed)
OUTPATIENT PHYSICAL THERAPY NEURO TREATMENT   Patient Name: Lee [name] MRN: [id number] DOB:01/25/1946, 76 y.o., male Today's Date: 02/23/2022   PCP: Cassandria Anger, MD REFERRING PROVIDER: Harvie Heck, MD    PT End of Session - 02/23/22 1020     Visit Number 2    Number of Visits 16    Date for PT Re-Evaluation 05/22/22    Authorization Type VA - 15 visits approved from 12/29/21 - 04/28/22    Authorization - Visit Number 1    Authorization - Number of Visits 15    PT Start Time 3976    PT Stop Time 1058    PT Time Calculation (min) 40 min    Equipment Utilized During Treatment Gait belt    Activity Tolerance Patient tolerated treatment well    Behavior During Therapy WFL for tasks assessed/performed   easily distracted            Past Medical History:  Diagnosis Date   Acid reflux    Alcohol abuse    quit 10 y ago   Arthritis    BPH (benign prostatic hyperplasia)    CAD (coronary artery disease)    Cervical spondylosis without myelopathy 06/02/2015   COPD (chronic obstructive pulmonary disease) ([place])    Emphysema lung ([place])    GERD (gastroesophageal reflux disease)    High cholesterol    Hypertension    Parkinson's disease ([place])    PVD (peripheral vascular disease) ([place])    Tremor 06/02/2015   Past Surgical History:  Procedure Laterality Date   TONSILLECTOMY     VASECTOMY     Patient Active Problem List   Diagnosis Date Noted   Acquired deformities of toe(s), unspecified, unspecified foot 11/27/2021   Asthma 11/27/2021   Cervicalgia 11/27/2021   Corns and callosities 11/27/2021   Disturbance of skin sensation 11/27/2021   Elevated prostate specific antigen (PSA) 11/27/2021   Excessive attrition of teeth 11/27/2021   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   Family history of cancer 11/27/2021   Family history of malignant neoplasm of prostate 11/27/2021   Hyperlipidemia 11/27/2021   Iron deficiency anemia, unspecified 11/27/2021    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[place]) 11/27/2021   Myalgia 11/27/2021   Nail dystrophy 11/27/2021   Benign neoplasm of other parts of mouth 11/27/2021   Open angle with borderline findings, high risk, left eye 11/27/2021   Other abnormal glucose 11/27/2021   Other reduced mobility 11/27/2021   Other secondary cataract, bilateral 11/27/2021   Pain in right thigh 11/27/2021   Pain in right knee 11/27/2021   Pain in unspecified shoulder 11/27/2021   Persons encountering health services in other specified circumstances 11/27/2021   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/27/2021   Right lower quadrant pain 11/27/2021   Tinnitus 11/27/2021   Trochanteric bursitis, right hip 11/27/2021   Unilateral primary osteoarthritis, right hip 11/27/2021   Unspecified fall, initial encounter 11/27/2021   Vitamin D deficiency, unspecified 11/27/2021   Weakness 11/27/2021   Constipation 08/28/2021   Chronic obstructive pulmonary disease ([place]) 05/31/2021   Depression 04/13/2021   Parkinson's disease ([place]) 02/28/2021   Weakness of both legs 02/01/2021   Dysphagia 02/01/2021   Weakness of right leg 01/06/2021   Lumbar radiculopathy 12/30/2020   Prostate cancer ([place]) 06/09/2020   Well adult exam 06/04/2019   BPH (benign prostatic hyperplasia) 06/04/2019   Bilateral carotid bruits 06/04/2019   Meniere disease, 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   Emphysema of  lung ([place]) 06/04/2019   Spondylolisthesis at L4-L5 level 08/27/2017   Spinal stenosis in cervical region 08/27/2017   Tremor 06/02/2015   Cervical spondylosis without myelopathy 06/02/2015   Alcoholism in remission ([place]) 06/09/2014   Coronary arteriosclerosis 07/30/2009   History of colonoscopy 07/30/2009   Neutropenia ([place]) 07/30/2008   HIP PAIN 11/27/2007   Essential hypertension 07/25/2007   Atrial fibrillation ([place]) 07/25/2007   GERD 07/25/2007   ERECTILE DYSFUNCTION  07/25/2007   LOW BACK PAIN 07/25/2007    ONSET DATE: 12/29/2021   REFERRING DIAG: G20 (ICD-10-CM) - Parkinson disease ([place])   THERAPY DIAG:  Other symptoms and signs involving the nervous system  Abnormal posture  Unsteadiness on feet  Muscle weakness (generalized)  Rationale for Evaluation and Treatment Rehabilitation  SUBJECTIVE:                                                                                                                                                                                              SUBJECTIVE STATEMENT: Nothing new since he was last here. Brought in medication from home today. Forgot to take them this morning.   Pt accompanied by: self  PERTINENT HISTORY: PMH:  neck and back pain, PD -dx in March 2022 per pt report, hx of ETOH abuse, CAD, COPD, emphysema, GERD, HTN, high cholesterol, PVD, pt with hx of being non-compliant with meds  PAIN:  Are you having pain? Yes: NPRS scale: 3-5/10 Pain location: RLE - R knee/quad, R hip Pain description: Dull Aggravating factors: Depends how his body is like, pressure Relieving factors: Sitting up taller, tumeric  PRECAUTIONS: Fall  FALLS: Has patient fallen in last 6 months?  No falls to the ground, but stumbles all the time, can't keep count, normally can catch himself.   PLOF: Independent with community mobility with device  PATIENT GOALS Wants to get rid of the PD and go back to where he was 2-3 years ago. Get back to walking in the park.   OBJECTIVE:   TODAY'S TREATMENT:  Self-Care:  Pt brought in his medication in a bag to show to therapist. One medication (rasagaline) is expired (pt is not taking this anymore). Discussed pt would need to discard these meds at his pharmacy.  Pt reports he is only taking his Carbidopa Levodopa (2 tablets a day for 3 times a day) that he was prescribed from Dr. Leta Baptist last December. Pt has been taking them at inconsistent times each day. Discussed  importance of taking at the same time each day for max effectiveness and not taking medication with protein. PT looked to see what specific times pt would  need to take his medication, but was unable to find in med list or Dr. Gladstone Lighter note. From Dr. Gladstone Lighter last note in 12/2021, it reports that pt has stopped taking all of his PD meds. Discussed with pt that he will need to let Dr. Leta Baptist know that he wants to restart taking these meds and to ask what specific times he needs to take it daily. Provided phone number of office and what he will need to ask. PT to also send message as well.    NMR:  Push and release test: Anterior = 4-5 small steps Posterior = 5-6 small steps, min A for steadying Lateral = unable to elicit a step in either direction.   Educated on purpose of stepping strategies for balance.    Pt performs PWR! Moves in standing position (chair at edge of mat as needed for balance):    PWR! Up for improved posture x10 reps, initial cues for posture and scap retraction.   PWR! Rock for improved weighshifting x10 reps, cues to look up at hands and to have tall posture when going through midline.   PWR! Twist for improved trunk rotation x10 reps   PWR! Step for improved step initiation x10 reps, cues for picking up feet, pt needing intermittent UE support   Cues provided for technique and how it relates to function. Reviewed as HEP from previous bout of PT, provided new handout (per pt request).   PATIENT EDUCATION: Education details: See Self-Care, reviewed standing PWR moves from previous HEP.  Person educated: Patient Education method: Explanation, Demonstration, and Handouts Education comprehension: verbalized understanding, returned demonstration, and needs further education   HOME EXERCISE PROGRAM: Will review from previous bout of therapy and update as appropriate.   [id number], Seated and Standing PWR moves     GOALS: Goals reviewed with patient?  Yes  SHORT TERM GOALS: Target date: 03/21/2022  Pt will be independent with initial HEP for improved strength, balance, transfers, and gait.  Baseline: Goal status: INITIAL  2.  Pt will verbalize tips to reduce festination/freezing with gait and turns.  Baseline:  Goal status: INITIAL  3.  Pt will verbalize understanding of local Parkinson's disease resources.  Baseline:  Goal status: INITIAL  4.  Push and release test to be assessed with LTG written for improved balance recovery. Baseline: Anterior = 4-5 small steps Posterior = 5-6 small steps, min A for steadying Lateral = unable to elicit a step in either direction.  Goal status: MET  5.   Pt will improve 5x sit<>stand with BUE support to less than or equal to 22 sec to demonstrate improved functional strength and transfer efficiency.  Baseline: 26.57 seconds with BUE support  Goal status: INITIAL    LONG TERM GOALS: Target date: 04/18/2022  Pt will be independent with final HEP for improved strength, balance, transfers, and gait.  Baseline:  Goal status: INITIAL  2.   Pt will improve gait velocity to at least 2.4 ft/sec with LRAD for improved gait efficiency and safety.   Baseline: 14.96 seconds with SPC = 2.19 ft/sec Goal status: INITIAL  3.   Pt will ambulate at least 500' ft indoor and outdoor surfaces, appropriate assistive device with supervision for imrpoved community gait.   Baseline:  Goal status: INITIAL  4.  Pt will recover posterior and anterior balance in push and release test in 2-3 or less steps independently, for improved balance recovery.   Baseline: Anterior = 4-5 small steps Posterior = 5-6 small steps,  min A for steadying Goal status: INITIAL  5.  Pt will improve 5x sit<>stand with BUE support to less than or equal to 22 sec to demonstrate improved functional strength and transfer efficiency. Baseline: 26.57 seconds with BUE support  Goal status: INITIAL  6.  Pt will improve cog TUG to 17  seconds or less in order to demo improved dual tasking/decr fall risk.  Baseline: 22.38 sec with SPC  Goal status: INITIAL    ASSESSMENT:  CLINICAL IMPRESSION: See Self-Care section above for extensive discussion regarding pt's medications. Performed push and release test with pt demonstrating multiple small steps in the anterior and posterior direction in order to maintain balance and was unable to elicit a step in the lateral direction. LTG updated. Remainder of session focused on reviewing standing PWR moves for HEP (from previous bout of therapy. Educated for pt to have a chair in front of him to perform at home for safety with balance. Will continue to progress towards LTGs.    OBJECTIVE IMPAIRMENTS Abnormal gait, decreased activity tolerance, decreased balance, decreased cognition, decreased coordination, decreased endurance, decreased knowledge of use of DME, difficulty walking, decreased strength, decreased safety awareness, impaired flexibility, impaired sensation, impaired tone, postural dysfunction, and pain.   ACTIVITY LIMITATIONS stairs, transfers, bed mobility, and locomotion level  PARTICIPATION LIMITATIONS: cleaning and community activity  PERSONAL FACTORS Behavior pattern, Past/current experiences, Time since onset of injury/illness/exacerbation, and 3+ comorbidities: neck and back pain, PD -dx in March 2022 per pt report, hx of ETOH abuse, CAD, COPD, emphysema, GERD, HTN, high cholesterol, PVD  are also affecting patient's functional outcome.   REHAB POTENTIAL: Good  CLINICAL DECISION MAKING: Evolving/moderate complexity  EVALUATION COMPLEXITY: Moderate  PLAN: PT FREQUENCY: 2x/week  PT DURATION: 12 weeks  PLANNED INTERVENTIONS: Therapeutic exercises, Therapeutic activity, Neuromuscular re-education, Balance training, Gait training, Patient/Family education, Self Care, Stair training, DME instructions, and Re-evaluation  PLAN FOR NEXT SESSION: Did pt ever reach out  to Dr. Leta Baptist regarding his medication? Review remainder of HEP, stepping strategies for balance, weight shifting/SLS, education on techniques for freezing/festination episodes.    Arliss Journey, PT, DPT  02/23/2022, 2:59 PM

## 2022-02-23 NOTE — Therapy (Signed)
OUTPATIENT SPEECH LANGUAGE PATHOLOGY TREATMENT NOTE   Patient Name: Lee [name] MRN: [id number] DOB:June 30, 1946, 76 y.o., male Today's Date: 02/23/2022  PCP: Dr. Lew Dawes  REFERRING PROVIDER: Harvie Heck, MD   END OF SESSION:   End of Session - 02/23/22 1014     Visit Number 2    Number of Visits 25    Date for SLP Re-Evaluation 05/16/22    SLP Start Time 67    SLP Stop Time  6812    SLP Time Calculation (min) 45 min    Activity Tolerance Patient tolerated treatment well             Past Medical History:  Diagnosis Date   Acid reflux    Alcohol abuse    quit 10 y ago   Arthritis    BPH (benign prostatic hyperplasia)    CAD (coronary artery disease)    Cervical spondylosis without myelopathy 06/02/2015   COPD (chronic obstructive pulmonary disease) ([place])    Emphysema lung ([place])    GERD (gastroesophageal reflux disease)    High cholesterol    Hypertension    Parkinson's disease ([place])    PVD (peripheral vascular disease) ([place])    Tremor 06/02/2015   Past Surgical History:  Procedure Laterality Date   TONSILLECTOMY     VASECTOMY     Patient Active Problem List   Diagnosis Date Noted   Acquired deformities of toe(s), unspecified, unspecified foot 11/27/2021   Asthma 11/27/2021   Cervicalgia 11/27/2021   Corns and callosities 11/27/2021   Disturbance of skin sensation 11/27/2021   Elevated prostate specific antigen (PSA) 11/27/2021   Excessive attrition of teeth 11/27/2021   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   Family history of cancer 11/27/2021   Family history of malignant neoplasm of prostate 11/27/2021   Hyperlipidemia 11/27/2021   Iron deficiency anemia, unspecified 11/27/2021   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[place]) 11/27/2021   Myalgia 11/27/2021   Nail dystrophy 11/27/2021   Benign neoplasm of other parts of mouth 11/27/2021   Open angle with borderline findings, high risk, left eye 11/27/2021   Other abnormal  glucose 11/27/2021   Other reduced mobility 11/27/2021   Other secondary cataract, bilateral 11/27/2021   Pain in right thigh 11/27/2021   Pain in right knee 11/27/2021   Pain in unspecified shoulder 11/27/2021   Persons encountering health services in other specified circumstances 11/27/2021   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/27/2021   Right lower quadrant pain 11/27/2021   Tinnitus 11/27/2021   Trochanteric bursitis, right hip 11/27/2021   Unilateral primary osteoarthritis, right hip 11/27/2021   Unspecified fall, initial encounter 11/27/2021   Vitamin D deficiency, unspecified 11/27/2021   Weakness 11/27/2021   Constipation 08/28/2021   Chronic obstructive pulmonary disease ([place]) 05/31/2021   Depression 04/13/2021   Parkinson's disease ([place]) 02/28/2021   Weakness of both legs 02/01/2021   Dysphagia 02/01/2021   Weakness of right leg 01/06/2021   Lumbar radiculopathy 12/30/2020   Prostate cancer ([place]) 06/09/2020   Well adult exam 06/04/2019   BPH (benign prostatic hyperplasia) 06/04/2019   Bilateral carotid bruits 06/04/2019   Meniere disease, 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   Emphysema of lung ([place]) 06/04/2019   Spondylolisthesis at L4-L5 level 08/27/2017   Spinal stenosis in cervical region 08/27/2017   Tremor 06/02/2015   Cervical spondylosis without myelopathy 06/02/2015   Alcoholism in remission ([place]) 06/09/2014   Coronary arteriosclerosis 07/30/2009   History of colonoscopy 07/30/2009  Neutropenia ([place]) 07/30/2008   HIP PAIN 11/27/2007   Essential hypertension 07/25/2007   Atrial fibrillation ([place]) 07/25/2007   GERD 07/25/2007   ERECTILE DYSFUNCTION 07/25/2007   LOW BACK PAIN 07/25/2007    ONSET DATE: March 2022 PD dx  REFERRING DIAG:  R13.10 (ICD-10-CM) - Dysphagia   THERAPY DIAG:  Dysarthria and anarthria  Dysphagia, unspecified type  Cognitive communication deficit  Rationale  for Evaluation and Treatment Rehabilitation  SUBJECTIVE: "My mind is slow today"  PAIN:  Are you having pain? No  OBJECTIVE:   TODAY'S TREATMENT:  02-23-22: Pt demonstrating and reporting cognitive deficits today related to reduced short term recall, attention, and problem solving. Discussed some recommendations to aid recall of recent information and problem solving for medication management (call MD for specific instructions). Initiated education and instruction of Speak Out! 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 Targeted improving vocal quality and increasing intensity through progressively difficulty speech tasks using Speak Out! program, lesson 1. ST leads pt through exercises providing usual model prior to pt execution. Usual min-A required to achieve target dB this date. Averages this date: loud "ah" 92 dB; reading 77 dB; cognitive speech task 75 dB. Conversational sample of approx 3 minutes, pt averages 72 dB with occasional min-A.   02-21-22: Initiated training in HEP for dysarthria and compensations for dysfluency. Educated pt re: recommendation for MBSS. Loud /a/ and oral reading with occasional min to mod A. Provided information on Speak Out and how to order the Speak out work book     PATIENT EDUCATION: Education details: See above (and patient instructions) Person educated: Patient Education method: Explanation, Demonstration, Verbal cues, and Handouts Education comprehension: verbalized understanding, returned demonstration, verbal cues required, and needs further education     HOME EXERCISE PROGRAM: Speak Out     GOALS: Goals reviewed with patient? Yes   SHORT TERM GOALS: Target date: 03/26/22 Pt will complete HEP for dysarthria with occasional min A over 2 sessions Baseline: Goal status: ongoing   2.  Pt will average 70dB 18/20 sentences with rare min A over 2 sessions Baseline:  Goal status: ongoing   3.  Pt will  average 70dB over 8 minute simple conversation with occasional min A over 2 sessions Baseline:  Goal status: ongoing   4.  Pt will complete MBSS Baseline:  Goal status: ongoing   5.  Pt will follow diet modifications and swallow precautions after MBSS with occasional min A Baseline:  Goal status: ongoing     LONG TERM GOALS: Target date: 05/16/22   Pt will complete HEP for dysarthria with rare min A over 2 sessions Baseline:  Goal status: ongoing   2.  Pt will complete HEP for dysphagia if indicated s/p MBSS with occasional min A Baseline:  Goal status: ongoing   3.  Pt will average 70 dB during 15 minute conversation with rare min A over 2 sessions Baseline:  Goal status: ongoing   4.  Pt will improve score on Communicative Effectiveness Survey Baseline: 13/32 Goal status: ongoing     ASSESSMENT:   CLINICAL IMPRESSION: Patient is a 76 y.o. male who was seen today for dysphagia and dysarthria. Initiated education and training of Speak Out! Program to maximize patient vocal intensity and clarity. MBSS scheduled for next week to assess dysphagia given patient reports. Recommend skilled ST to maximize intelligibility for safety, QOL and safety of swallow.    OBJECTIVE IMPAIRMENTS  Objective impairments include memory, dysphagia, and dysarthria. These  impairments are limiting patient from effectively communicating at home and in community and safety when swallowing. Factors affecting potential to achieve goals and functional outcome are medical prognosis. Patient will benefit from skilled SLP services to address above impairments and improve overall function.   REHAB POTENTIAL: Good   PLAN: SLP FREQUENCY: 2x/week   SLP DURATION: 12 weeks   PLANNED INTERVENTIONS: Aspiration precaution training, Pharyngeal strengthening exercises, Diet toleration management , Language facilitation, Environmental controls, Trials of upgraded texture/liquids, Cueing hierachy, Cognitive  reorganization, Internal/external aids, Functional tasks, Multimodal communication approach, and SLP instruction and feedback    Marzetta Board, [place] 02/23/2022, 10:59 AM

## 2022-02-23 NOTE — Patient Instructions (Addendum)
Please call [facility] to order your "free" workbook.   Monday-Thursday 9 AM-4 PM  Closed on Fridays  Toll-free: [phone] Phone: [phone]  As you waiting for your workbook, practice these every single day:  Loud Ah! As loud and and long as you can without straining your voice Count 1-20 with big breath and loud voice Read aloud 5 minute focusing on volume

## 2022-02-26 ENCOUNTER — Telehealth: Payer: Self-pay | Admitting: Diagnostic Neuroimaging

## 2022-02-26 NOTE — Telephone Encounter (Signed)
Contacted pt back, he stated he restarted medication after last visit in June. Has been doing fine on it on his own. Wanted to know if and how he should take medication. He is scared to mix the medications after doing research on google. Informed him during last visit the reported he did not want to take it as it had no benefit to  him. He said he wanted to try it again and has been taking medications from the Rx prescribed in December 2022. Do you want pt to restart medication as previously prescribed, take 2 tablets 3 times daily?

## 2022-02-26 NOTE — Telephone Encounter (Signed)
Pt said started taking Carb/Levo 2 tablet am, 2 tablets midday  2 tablet pm on his own for a month and half. Would like a call from the nurse to discuss what time to take medication.

## 2022-02-27 ENCOUNTER — Ambulatory Visit: Payer: No Typology Code available for payment source | Admitting: Physical Therapy

## 2022-02-27 ENCOUNTER — Ambulatory Visit: Payer: No Typology Code available for payment source | Admitting: Internal Medicine

## 2022-02-28 ENCOUNTER — Ambulatory Visit (INDEPENDENT_AMBULATORY_CARE_PROVIDER_SITE_OTHER): Payer: No Typology Code available for payment source | Admitting: Internal Medicine

## 2022-02-28 ENCOUNTER — Encounter: Payer: Self-pay | Admitting: Internal Medicine

## 2022-02-28 VITALS — BP 100/58 | HR 92 | Temp 98.7°F | Ht 70.5 in | Wt 151.0 lb

## 2022-02-28 DIAGNOSIS — G20A1 Parkinson's disease without dyskinesia, without mention of fluctuations: Secondary | ICD-10-CM

## 2022-02-28 DIAGNOSIS — G20 Parkinson's disease: Secondary | ICD-10-CM

## 2022-02-28 DIAGNOSIS — R251 Tremor, unspecified: Secondary | ICD-10-CM

## 2022-02-28 DIAGNOSIS — Z7409 Other reduced mobility: Secondary | ICD-10-CM

## 2022-02-28 DIAGNOSIS — R202 Paresthesia of skin: Secondary | ICD-10-CM | POA: Diagnosis not present

## 2022-02-28 DIAGNOSIS — D72819 Decreased white blood cell count, unspecified: Secondary | ICD-10-CM | POA: Insufficient documentation

## 2022-02-28 LAB — CBC WITH DIFFERENTIAL/PLATELET
Basophils Absolute: 0 10*3/uL (ref 0.0–0.1)
Basophils Relative: 0.6 % (ref 0.0–3.0)
Eosinophils Absolute: 0.2 10*3/uL (ref 0.0–0.7)
Eosinophils Relative: 6.7 % — ABNORMAL HIGH (ref 0.0–5.0)
HCT: 40.7 % (ref 39.0–52.0)
Hemoglobin: 13.8 g/dL (ref 13.0–17.0)
Lymphocytes Relative: 23.4 % (ref 12.0–46.0)
Lymphs Abs: 0.8 10*3/uL (ref 0.7–4.0)
MCHC: 33.9 g/dL (ref 30.0–36.0)
MCV: 98.7 fl (ref 78.0–100.0)
Monocytes Absolute: 0.5 10*3/uL (ref 0.1–1.0)
Monocytes Relative: 15.1 % — ABNORMAL HIGH (ref 3.0–12.0)
Neutro Abs: 1.9 10*3/uL (ref 1.4–7.7)
Neutrophils Relative %: 54.2 % (ref 43.0–77.0)
Platelets: 192 10*3/uL (ref 150.0–400.0)
RBC: 4.12 Mil/uL — ABNORMAL LOW (ref 4.22–5.81)
RDW: 12.7 % (ref 11.5–15.5)
WBC: 3.5 10*3/uL — ABNORMAL LOW (ref 4.0–10.5)

## 2022-02-28 LAB — COMPREHENSIVE METABOLIC PANEL
ALT: 15 U/L (ref 0–53)
AST: 18 U/L (ref 0–37)
Albumin: 4.4 g/dL (ref 3.5–5.2)
Alkaline Phosphatase: 53 U/L (ref 39–117)
BUN: 12 mg/dL (ref 6–23)
CO2: 28 mEq/L (ref 19–32)
Calcium: 9.7 mg/dL (ref 8.4–10.5)
Chloride: 102 mEq/L (ref 96–112)
Creatinine, Ser: 1.01 mg/dL (ref 0.40–1.50)
GFR: 73.2 mL/min (ref >60.00)
Glucose, Bld: 94 mg/dL (ref 70–99)
Potassium: 3.9 mEq/L (ref 3.5–5.1)
Sodium: 137 mEq/L (ref 135–145)
Total Bilirubin: 0.6 mg/dL (ref 0.2–1.2)
Total Protein: 7.4 g/dL (ref 6.0–8.3)

## 2022-02-28 LAB — TSH: TSH: 1.98 u[IU]/mL (ref 0.35–5.50)

## 2022-02-28 LAB — VITAMIN B12: Vitamin B-12: 537 pg/mL (ref 211–911)

## 2022-02-28 NOTE — Assessment & Plan Note (Signed)
Not taking Sinemet. Saying he is doing worse.Marland Kitchen "I'm doing my research on my own, taking a MVI" Use a cane

## 2022-02-28 NOTE — Progress Notes (Signed)
Subjective:  Patient ID: Lee [name], male    DOB: 1946/01/12  Age: 76 y.o. MRN: [id number]  CC: No chief complaint on file.   HPI Add Dinapoli presents for Parkinson's. Not taking Sinemet. He is a difficult historian. Saying he is doing worse.Marland Kitchen "I'm doing my research on my own, taking a MVI"  Outpatient Medications Prior to Visit  Medication Sig Dispense Refill   albuterol (PROVENTIL HFA;VENTOLIN HFA) 108 (90 Base) MCG/ACT inhaler Inhale 2 puffs into the lungs every 4 (four) hours as needed for wheezing or shortness of breath. 1 Inhaler 0   Alum Hydroxide-Mag Carbonate 160-105 MG CHEW CHEW 1 TABLET BY MOUTH AS DIRECTED BY YOUR PROVIDER FOR STOMACH UPSET/DYSPEPSIA UP TO THREE TIMES DAILY     ascorbic acid (VITAMIN C) 500 MG tablet TAKE ONE TABLET BY MOUTH DAILY TO HELP WITH IRON STORES AND IMMUNE SYSTEM. PLEASE TAKE EVERY DAY AND ESPECIALLY WHEN YOU TAKE FERROUS SULFATE.     aspirin EC 81 MG tablet      BLACK PEPPER-TURMERIC PO Take by mouth daily. Mixes as a powder for back pain (ginger, cinnamon, black pepper, turmeric)     carbidopa-levodopa (SINEMET IR) 25-100 MG tablet Take 2 tablets by mouth 3 (three) times daily.     cetirizine (ZYRTEC) 10 MG tablet      Cholecalciferol 50 MCG (2000 UT) TABS      CINNAMON PO Mixes as a powder for back pain (ginger, cinnamon, black pepper, turmeric)     diclofenac Sodium (VOLTAREN) 1 % GEL      FEROSUL 325 (65 Fe) MG tablet Take by mouth.     fluticasone (FLONASE) 50 MCG/ACT nasal spray INSTILL 1 SPRAY IN EACH NOSTRIL TWICE A DAY FOR DIZZINESS     folic acid (FOLVITE) 1 MG tablet Take 1 mg by mouth daily.     Ginger, Zingiber officinalis, (GINGER PO) Mixes as a powder for back pain (ginger, cinnamon, black pepper, turmeric)     hydrochlorothiazide (HYDRODIURIL) 25 MG tablet      methocarbamol (ROBAXIN) 500 MG tablet TAKE ONE TABLET BY MOUTH AFTER SUPPER -- TAKE AFTER EVENING MEAL WHEN NEEDED.  NO DRIVING FOR 8 HOURS. FOR MUSCLE PAIN/SPASMS      mirtazapine (REMERON) 7.5 MG tablet Take 1 tablet (7.5 mg total) by mouth at bedtime. 30 tablet 6   mometasone-formoterol (DULERA) 200-5 MCG/ACT AERO Inhale 2 puffs into the lungs in the morning and at bedtime. 3 each 2   montelukast (SINGULAIR) 10 MG tablet Take 1 tablet (10 mg total) by mouth at bedtime. 30 tablet 6   Multiple Vitamin (QUINTABS) TABS Take by mouth.     naproxen (NAPROSYN) 500 MG tablet      OVER THE COUNTER MEDICATION NeuroQ daily     oxybutynin (DITROPAN-XL) 10 MG 24 hr tablet Take 10 mg by mouth at bedtime.     pantoprazole (PROTONIX) 40 MG tablet Take by mouth.     sildenafil (VIAGRA) 100 MG tablet      tamsulosin (FLOMAX) 0.4 MG CAPS capsule Take 0.4 mg by mouth.     Tiotropium Bromide Monohydrate 2.5 MCG/ACT AERS Inhale 2 puffs into the lungs daily.     vitamin C (ASCORBIC ACID) 500 MG tablet Take by mouth.     No facility-administered medications prior to visit.    ROS: Review of Systems  Constitutional:  Positive for fatigue. Negative for appetite change and unexpected weight change.  HENT:  Negative for congestion, nosebleeds, sneezing, sore throat and trouble  swallowing.   Eyes:  Negative for itching and visual disturbance.  Respiratory:  Negative for cough.   Cardiovascular:  Negative for chest pain, palpitations and leg swelling.  Gastrointestinal:  Negative for abdominal distention, blood in stool, diarrhea and nausea.  Genitourinary:  Negative for frequency and hematuria.  Musculoskeletal:  Positive for gait problem. Negative for back pain, joint swelling and neck pain.  Skin:  Negative for rash.  Neurological:  Positive for weakness. Negative for dizziness, tremors and speech difficulty.  Psychiatric/Behavioral:  Negative for agitation, dysphoric mood and sleep disturbance. The patient is not nervous/anxious.     Objective:  BP (!) 100/58 (BP Location: Left Arm, Patient Position: Sitting, Cuff Size: Normal)   Pulse 92   Temp 98.7 F (37.1 C)  (Oral)   Ht 5' 10.5" (1.791 m)   Wt 151 lb (68.5 kg)   SpO2 95%   BMI 21.36 kg/m   BP Readings from Last 3 Encounters:  02/28/22 (!) 100/58  01/09/22 105/68  12/29/21 118/68    Wt Readings from Last 3 Encounters:  02/28/22 151 lb (68.5 kg)  01/09/22 155 lb (70.3 kg)  12/29/21 153 lb 3.2 oz (69.5 kg)    Physical Exam Constitutional:      General: He is not in acute distress.    Appearance: He is well-developed.     Comments: NAD  Eyes:     Conjunctiva/sclera: Conjunctivae normal.     Pupils: Pupils are equal, round, and reactive to light.  Neck:     Thyroid: No thyromegaly.     Vascular: No JVD.  Cardiovascular:     Rate and Rhythm: Normal rate and regular rhythm.     Heart sounds: Normal heart sounds. No murmur heard.    No friction rub. No gallop.  Pulmonary:     Effort: Pulmonary effort is normal. No respiratory distress.     Breath sounds: Normal breath sounds. No wheezing or rales.  Chest:     Chest wall: No tenderness.  Abdominal:     General: Bowel sounds are normal. There is no distension.     Palpations: Abdomen is soft. There is no mass.     Tenderness: There is no abdominal tenderness. There is no guarding or rebound.  Musculoskeletal:        General: No tenderness. Normal range of motion.     Cervical back: Normal range of motion.  Lymphadenopathy:     Cervical: No cervical adenopathy.  Skin:    General: Skin is warm and dry.     Findings: No rash.  Neurological:     Mental Status: He is alert and oriented to person, place, and time.     Cranial Nerves: No cranial nerve deficit.     Motor: Weakness present. No abnormal muscle tone.     Coordination: Coordination normal.     Gait: Gait abnormal.     Deep Tendon Reflexes: Reflexes are normal and symmetric.  Psychiatric:        Behavior: Behavior normal.   Ataxic, stiff R hand tremor Using a cane Talkative    Lab Results  Component Value Date   WBC 4.2 08/28/2021   HGB 13.7 08/28/2021    HCT 41.4 08/28/2021   PLT 195.0 08/28/2021   GLUCOSE 101 (H) 08/28/2021   CHOL 225 (H) 02/01/2021   TRIG 56.0 02/01/2021   HDL 88.10 02/01/2021   LDLCALC 126 (H) 02/01/2021   ALT 19 08/28/2021   AST 20 08/28/2021   NA 139  08/28/2021   K 3.8 08/28/2021   CL 103 08/28/2021   CREATININE 1.06 08/28/2021   BUN 20 08/28/2021   CO2 28 08/28/2021   TSH 2.04 08/28/2021   PSA 5.09 (H) 02/01/2021    No results found.  Assessment & Plan:   Problem List Items Addressed This Visit     Other reduced mobility    Not taking Sinemet. Saying he is doing worse.Marland Kitchen "I'm doing my research on my own, taking a MVI" Use a cane; may need a Rolator walker - pt declined      Parkinson's disease ([place])    Not taking Sinemet. Saying he is doing worse.Marland Kitchen "I'm doing my research on my own, taking a MVI" Use a cane      Relevant Medications   carbidopa-levodopa (SINEMET IR) 25-100 MG tablet   Other Relevant Orders   TSH   CBC with Differential/Platelet   Comprehensive metabolic panel   Vitamin M35   Tremor    Not taking Sinemet. Saying he is doing worse.Marland Kitchen "I'm doing my research on my own, taking a MVI"      Relevant Orders   TSH   CBC with Differential/Platelet   Comprehensive metabolic panel   Vitamin D97   Other Visit Diagnoses     Paresthesias    -  Primary   Relevant Orders   Vitamin B12         No orders of the defined types were placed in this encounter.     Follow-up: Return in about 3 months (around 05/31/2022) for a follow-up visit.  Walker Kehr, MD

## 2022-02-28 NOTE — Assessment & Plan Note (Signed)
Not taking Sinemet. Saying he is doing worse.Marland Kitchen "I'm doing my research on my own, taking a MVI"

## 2022-02-28 NOTE — Assessment & Plan Note (Addendum)
Not taking Sinemet. Saying he is doing worse.Lee [name] "I'm doing my research on my own, taking a MVI" Use a cane; may need a Rolator walker - pt declined

## 2022-02-28 NOTE — Telephone Encounter (Signed)
Contacted pt back, went over Dr Leta Baptist recommendations and reiterated directions to him. Pt verbally understood and was appreciative.

## 2022-03-01 ENCOUNTER — Ambulatory Visit: Payer: No Typology Code available for payment source | Attending: Internal Medicine

## 2022-03-01 ENCOUNTER — Encounter: Payer: Self-pay | Admitting: Internal Medicine

## 2022-03-01 ENCOUNTER — Ambulatory Visit: Payer: No Typology Code available for payment source | Admitting: Physical Therapy

## 2022-03-01 DIAGNOSIS — M6281 Muscle weakness (generalized): Secondary | ICD-10-CM | POA: Diagnosis not present

## 2022-03-01 DIAGNOSIS — R29818 Other symptoms and signs involving the nervous system: Secondary | ICD-10-CM | POA: Insufficient documentation

## 2022-03-01 DIAGNOSIS — R471 Dysarthria and anarthria: Secondary | ICD-10-CM | POA: Insufficient documentation

## 2022-03-01 DIAGNOSIS — R2689 Other abnormalities of gait and mobility: Secondary | ICD-10-CM | POA: Diagnosis not present

## 2022-03-01 DIAGNOSIS — R251 Tremor, unspecified: Secondary | ICD-10-CM | POA: Diagnosis not present

## 2022-03-01 DIAGNOSIS — R2681 Unsteadiness on feet: Secondary | ICD-10-CM | POA: Diagnosis not present

## 2022-03-01 DIAGNOSIS — R278 Other lack of coordination: Secondary | ICD-10-CM | POA: Diagnosis not present

## 2022-03-01 DIAGNOSIS — R41841 Cognitive communication deficit: Secondary | ICD-10-CM | POA: Diagnosis present

## 2022-03-01 DIAGNOSIS — R293 Abnormal posture: Secondary | ICD-10-CM | POA: Insufficient documentation

## 2022-03-01 DIAGNOSIS — R1312 Dysphagia, oropharyngeal phase: Secondary | ICD-10-CM | POA: Insufficient documentation

## 2022-03-01 DIAGNOSIS — R41840 Attention and concentration deficit: Secondary | ICD-10-CM | POA: Diagnosis present

## 2022-03-01 DIAGNOSIS — R41844 Frontal lobe and executive function deficit: Secondary | ICD-10-CM | POA: Insufficient documentation

## 2022-03-01 DIAGNOSIS — R29898 Other symptoms and signs involving the musculoskeletal system: Secondary | ICD-10-CM | POA: Insufficient documentation

## 2022-03-01 NOTE — Therapy (Signed)
OUTPATIENT PHYSICAL THERAPY NEURO TREATMENT   Patient Name: Lee [name] MRN: [id number] DOB:[birth year], 76 y.o., male Today's Date: 03/01/2022   PCP: Cassandria Anger, MD REFERRING PROVIDER: Harvie Heck, MD    PT End of Session - 03/01/22 0809     Visit Number 3    Number of Visits 16    Date for PT Re-Evaluation 05/22/22    Authorization Type [facility] - 15 visits approved from 12/29/21 - 04/28/22    Authorization - Visit Number 1    Authorization - Number of Visits 15    PT Start Time 0808   Pt arrived late   PT Stop Time 0847    PT Time Calculation (min) 39 min    Equipment Utilized During Treatment --    Activity Tolerance Patient tolerated treatment well    Behavior During Therapy [facility] for tasks assessed/performed   easily distracted             Past Medical History:  Diagnosis Date   Acid reflux    Alcohol abuse    quit 10 y ago   Arthritis    BPH (benign prostatic hyperplasia)    CAD (coronary artery disease)    Cervical spondylosis without myelopathy 06/02/2015   COPD (chronic obstructive pulmonary disease) ([place])    Emphysema lung ([place])    GERD (gastroesophageal reflux disease)    High cholesterol    Hypertension    Parkinson's disease ([place])    PVD (peripheral vascular disease) ([place])    Tremor 06/02/2015   Past Surgical History:  Procedure Laterality Date   TONSILLECTOMY     VASECTOMY     Patient Active Problem List   Diagnosis Date Noted   Leukopenia 02/28/2022   Acquired deformities of toe(s), unspecified, unspecified foot 11/27/2021   Asthma 11/27/2021   Cervicalgia 11/27/2021   Corns and callosities 11/27/2021   Disturbance of skin sensation 11/27/2021   Elevated prostate specific antigen (PSA) 11/27/2021   Excessive attrition of teeth 11/27/2021   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   Family history of cancer 11/27/2021   Family history of malignant neoplasm of prostate 11/27/2021   Hyperlipidemia 11/27/2021   Iron deficiency  anemia, unspecified 11/27/2021   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[place]) 11/27/2021   Myalgia 11/27/2021   Nail dystrophy 11/27/2021   Benign neoplasm of other parts of mouth 11/27/2021   Open angle with borderline findings, high risk, left eye 11/27/2021   Other abnormal glucose 11/27/2021   Other reduced mobility 11/27/2021   Other secondary cataract, bilateral 11/27/2021   Pain in right thigh 11/27/2021   Pain in right knee 11/27/2021   Pain in unspecified shoulder 11/27/2021   Persons encountering health services in other specified circumstances 11/27/2021   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/27/2021   Right lower quadrant pain 11/27/2021   Tinnitus 11/27/2021   Trochanteric bursitis, right hip 11/27/2021   Unilateral primary osteoarthritis, right hip 11/27/2021   Unspecified fall, initial encounter 11/27/2021   Vitamin D deficiency, unspecified 11/27/2021   Weakness 11/27/2021   Constipation 08/28/2021   Chronic obstructive pulmonary disease ([place]) 05/31/2021   Depression 04/13/2021   Parkinson's disease ([place]) 02/28/2021   Weakness of both legs 02/01/2021   Dysphagia 02/01/2021   Weakness of right leg 01/06/2021   Lumbar radiculopathy 12/30/2020   Prostate cancer ([place]) 06/09/2020   Well adult exam 06/04/2019   BPH (benign prostatic hyperplasia) 06/04/2019   Bilateral carotid bruits 06/04/2019  Meniere disease, 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   Emphysema of lung ([place]) 06/04/2019   Spondylolisthesis at L4-L5 level 08/27/2017   Spinal stenosis in cervical region 08/27/2017   Tremor 06/02/2015   Cervical spondylosis without myelopathy 06/02/2015   Alcoholism in remission ([place]) 06/09/2014   Coronary arteriosclerosis 07/30/2009   History of colonoscopy 07/30/2009   Neutropenia ([place]) 07/30/2008   HIP PAIN 11/27/2007   Essential hypertension 07/25/2007   Atrial fibrillation ([place]) 07/25/2007   GERD  07/25/2007   ERECTILE DYSFUNCTION 07/25/2007   LOW BACK PAIN 07/25/2007    ONSET DATE: 12/29/2021   REFERRING DIAG: G20 (ICD-10-CM) - Parkinson disease ([place])   THERAPY DIAG:  Unsteadiness on feet  Other lack of coordination  Other symptoms and signs involving the nervous system  Rationale for Evaluation and Treatment Rehabilitation  SUBJECTIVE:                                                                                                                                                                                              SUBJECTIVE STATEMENT: Pt did not take his medications this morning, states that he has been "doing his own research" and has several questions regarding side effects/complications of Sinemet. Pt reports he has a fall on Monday in his home while walking out of bedroom into hallway (episode of freezing walking through doorway). Pt reports he hurt his RLE but "shook it off"   Pt accompanied by: self  PERTINENT HISTORY: PMH:  neck and back pain, PD -dx in March 2022 per pt report, hx of ETOH abuse, CAD, COPD, emphysema, GERD, HTN, high cholesterol, PVD, pt with hx of being non-compliant with meds  PAIN:  Are you having pain? Yes: NPRS scale: 2-3/10 Pain location: RLE - R knee/quad, R hip Pain description: Dull Aggravating factors: Depends how his body is like, pressure Relieving factors: Sitting up taller, tumeric  PRECAUTIONS: Fall  FALLS: Has patient fallen in last 6 months?  No falls to the ground, but stumbles all the time, can't keep count, normally can catch himself.  Had fall on 7/31 in home - first fall in which he was not able to catch himself   PLOF: Independent with community mobility with device  PATIENT GOALS Wants to get rid of the PD and go back to where he was 2-3 years ago. Get back to walking in the park.   OBJECTIVE:   TODAY'S TREATMENT:  Self-Care Pt reported he did not take his medication this morning and has done "extensive"  research regarding side effects of Sinemet and adverse reactions w/multi-vitamins. Therapist continuously encouraged pt to reach out to MD regarding medication  questions, as this is out of PT scope of practice. Pt reports he does not understand why he needs to take medication at the same time every day, as this does not "fit my lifestyle". Extensive education regarding importance of consistency w/medication for improved quality of movement and prediction of on/off periods for reduced fall risk. Also educated pt on research showing benefits of exercise on PD and how medication can assist pt w/exercise to reduce risk of sedentary lifestyle. Pt verbalized understanding.   Educated pt on minimizing intake of protein w/Sinemet and pt perseverating on whether it is best to eat organic vs non-organic foods. Referred pt to MD/Dietician regarding nutrition questions.   Pt reported he had a fall on Monday in his home (sounds as though he had freezing episode while ambulating through doorway and fell forward onto RLE and chest. Pt reports the fall "hurt" but he did not seriously get injured. Attempted to discuss freezing episodes and freezing strategies but pt not receptive.   Ther Ex  SciFit multi-peaks level 5 for 10 minutes using BUE/BLEs for neural priming for reciprocal movement, dynamic cardiovascular conditioning and increased amplitude of stepping. RPE of 9/10 following activity.     Gait pattern: step through pattern, decreased arm swing- Left, decreased step length- Right, decreased step length- Left, decreased stride length, festinating, and trunk flexed Distance walked: Various clinic distances  Assistive device utilized: Single point cane Level of assistance: SBA and CGA Comments: Pt ambulated into clinic w/SPC and had several episodes of freezing coming into doorway, passing other patients and in hallway, requiring CGA for safety. Pt not receptive to freezing techniques, as he does not feel as  though he is freezing. Pt could benefit from continued education on freezing episodes and strategies to reduce fall risk at home. Inquired about use of walker at home, in which pt immediately refused to even consider a more substantial AD.     PATIENT EDUCATION: Education details: See Self-Care, continue HEP Person educated: Patient Education method: Explanation, Demonstration, and Handouts Education comprehension: verbalized understanding, returned demonstration, and needs further education   HOME EXERCISE PROGRAM: Will review from previous bout of therapy and update as appropriate.   [id number], Seated and Standing PWR moves     GOALS: Goals reviewed with patient? Yes  SHORT TERM GOALS: Target date: 03/21/2022  Pt will be independent with initial HEP for improved strength, balance, transfers, and gait.  Baseline: Goal status: INITIAL  2.  Pt will verbalize tips to reduce festination/freezing with gait and turns.  Baseline:  Goal status: INITIAL  3.  Pt will verbalize understanding of local Parkinson's disease resources.  Baseline:  Goal status: INITIAL  4.  Push and release test to be assessed with LTG written for improved balance recovery. Baseline: Anterior = 4-5 small steps Posterior = 5-6 small steps, min A for steadying Lateral = unable to elicit a step in either direction.  Goal status: MET  5.   Pt will improve 5x sit<>stand with BUE support to less than or equal to 22 sec to demonstrate improved functional strength and transfer efficiency.  Baseline: 26.57 seconds with BUE support  Goal status: INITIAL    LONG TERM GOALS: Target date: 04/18/2022  Pt will be independent with final HEP for improved strength, balance, transfers, and gait.  Baseline:  Goal status: INITIAL  2.   Pt will improve gait velocity to at least 2.4 ft/sec with LRAD for improved gait efficiency and safety.   Baseline: 14.96 seconds with SPC =  2.19 ft/sec Goal status: INITIAL  3.    Pt will ambulate at least 500' ft indoor and outdoor surfaces, appropriate assistive device with supervision for imrpoved community gait.   Baseline:  Goal status: INITIAL  4.  Pt will recover posterior and anterior balance in push and release test in 2-3 or less steps independently, for improved balance recovery.   Baseline: Anterior = 4-5 small steps Posterior = 5-6 small steps, min A for steadying Goal status: INITIAL  5.  Pt will improve 5x sit<>stand with BUE support to less than or equal to 22 sec to demonstrate improved functional strength and transfer efficiency. Baseline: 26.57 seconds with BUE support  Goal status: INITIAL  6.  Pt will improve cog TUG to 17 seconds or less in order to demo improved dual tasking/decr fall risk.  Baseline: 22.38 sec with SPC  Goal status: INITIAL    ASSESSMENT:  CLINICAL IMPRESSION: Emphasis of skilled PT session on patient education and endurance. See self-care section for further details. Pt non-compliant w/Sinemet despite extensive education from MD and therapists regarding importance of medication. Pt had first fall earlier this week which has startled him, "maybe I need to take this more seriously". Pt continues to demonstrate significant festination w/freezing episodes (most notably in hallway and doorways). Pt will continue to benefit from extensive education and encouragement for medication compliance and PD management. Continue POC.    OBJECTIVE IMPAIRMENTS Abnormal gait, decreased activity tolerance, decreased balance, decreased cognition, decreased coordination, decreased endurance, decreased knowledge of use of DME, difficulty walking, decreased strength, decreased safety awareness, impaired flexibility, impaired sensation, impaired tone, postural dysfunction, and pain.   ACTIVITY LIMITATIONS stairs, transfers, bed mobility, and locomotion level  PARTICIPATION LIMITATIONS: cleaning and community activity  PERSONAL FACTORS  Behavior pattern, Past/current experiences, Time since onset of injury/illness/exacerbation, and 3+ comorbidities: neck and back pain, PD -dx in March 2022 per pt report, hx of ETOH abuse, CAD, COPD, emphysema, GERD, HTN, high cholesterol, PVD  are also affecting patient's functional outcome.   REHAB POTENTIAL: Good  CLINICAL DECISION MAKING: Evolving/moderate complexity  EVALUATION COMPLEXITY: Moderate  PLAN: PT FREQUENCY: 2x/week  PT DURATION: 12 weeks  PLANNED INTERVENTIONS: Therapeutic exercises, Therapeutic activity, Neuromuscular re-education, Balance training, Gait training, Patient/Family education, Self Care, Stair training, DME instructions, and Re-evaluation  PLAN FOR NEXT SESSION: Did pt take medication? Review remainder of HEP, stepping strategies for balance, weight shifting/SLS, education on techniques for freezing/festination episodes.    Cruzita Lederer Trayvon Trumbull, PT, DPT  03/01/2022, 8:53 AM

## 2022-03-01 NOTE — Therapy (Signed)
OUTPATIENT SPEECH LANGUAGE PATHOLOGY TREATMENT NOTE   Patient Name: Lee [name] MRN: [id number] DOB:[birth year], 76 y.o., male Today's Date: 03/01/2022  PCP: Dr. Lew Dawes  REFERRING PROVIDER: Harvie Heck, MD   END OF SESSION:   End of Session - 03/01/22 0827     Visit Number 3    Number of Visits 25    Date for SLP Re-Evaluation 05/16/22    SLP Start Time 0848    SLP Stop Time  0930    SLP Time Calculation (min) 42 min    Activity Tolerance Patient tolerated treatment well             Past Medical History:  Diagnosis Date   Acid reflux    Alcohol abuse    quit 10 y ago   Arthritis    BPH (benign prostatic hyperplasia)    CAD (coronary artery disease)    Cervical spondylosis without myelopathy 06/02/2015   COPD (chronic obstructive pulmonary disease) ([place])    Emphysema lung ([facility])    GERD (gastroesophageal reflux disease)    High cholesterol    Hypertension    Parkinson's disease ([place])    PVD (peripheral vascular disease) ([place])    Tremor 06/02/2015   Past Surgical History:  Procedure Laterality Date   TONSILLECTOMY     VASECTOMY     Patient Active Problem List   Diagnosis Date Noted   Leukopenia 02/28/2022   Acquired deformities of toe(s), unspecified, unspecified foot 11/27/2021   Asthma 11/27/2021   Cervicalgia 11/27/2021   Corns and callosities 11/27/2021   Disturbance of skin sensation 11/27/2021   Elevated prostate specific antigen (PSA) 11/27/2021   Excessive attrition of teeth 11/27/2021   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   Family history of cancer 11/27/2021   Family history of malignant neoplasm of prostate 11/27/2021   Hyperlipidemia 11/27/2021   Iron deficiency anemia, unspecified 11/27/2021   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[place]) 11/27/2021   Myalgia 11/27/2021   Nail dystrophy 11/27/2021   Benign neoplasm of other parts of mouth 11/27/2021   Open angle with borderline findings, high risk, left eye 11/27/2021    Other abnormal glucose 11/27/2021   Other reduced mobility 11/27/2021   Other secondary cataract, bilateral 11/27/2021   Pain in right thigh 11/27/2021   Pain in right knee 11/27/2021   Pain in unspecified shoulder 11/27/2021   Persons encountering health services in other specified circumstances 11/27/2021   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/27/2021   Right lower quadrant pain 11/27/2021   Tinnitus 11/27/2021   Trochanteric bursitis, right hip 11/27/2021   Unilateral primary osteoarthritis, right hip 11/27/2021   Unspecified fall, initial encounter 11/27/2021   Vitamin D deficiency, unspecified 11/27/2021   Weakness 11/27/2021   Constipation 08/28/2021   Chronic obstructive pulmonary disease ([place]) 05/31/2021   Depression 04/13/2021   Parkinson's disease ([place]) 02/28/2021   Weakness of both legs 02/01/2021   Dysphagia 02/01/2021   Weakness of right leg 01/06/2021   Lumbar radiculopathy 12/30/2020   Prostate cancer ([place]) 06/09/2020   Well adult exam 06/04/2019   BPH (benign prostatic hyperplasia) 06/04/2019   Bilateral carotid bruits 06/04/2019   Meniere disease, 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   Emphysema of lung ([place]) 06/04/2019   Spondylolisthesis at L4-L5 level 08/27/2017   Spinal stenosis in cervical region 08/27/2017   Tremor 06/02/2015   Cervical spondylosis without myelopathy 06/02/2015   Alcoholism in remission ([place]) 06/09/2014   Coronary arteriosclerosis 07/30/2009   History of  colonoscopy 07/30/2009   Neutropenia ([place]) 07/30/2008   HIP PAIN 11/27/2007   Essential hypertension 07/25/2007   Atrial fibrillation ([place]) 07/25/2007   GERD 07/25/2007   ERECTILE DYSFUNCTION 07/25/2007   LOW BACK PAIN 07/25/2007    ONSET DATE: March 2022 PD dx  REFERRING DIAG:  R13.10 (ICD-10-CM) - Dysphagia   THERAPY DIAG:  Dysarthria and anarthria  Cognitive communication deficit  Rationale for Evaluation  and Treatment Rehabilitation  SUBJECTIVE: "There has been so much on my plate that I haven't done any of the practice you gave me"  PAIN:  Are you having pain? No  OBJECTIVE:   TODAY'S TREATMENT:  03-01-22: Pt entered with decreased vocal clarity and intensity this session (mid to upper 60s dB) requiring intermittent cued repetition to optimize listener comprehension. Pt reported feeling fatigued and light headed following PT session, which improved with cued abdominal breathing and rest break. Pt able to recall conversation with MD re: when to take PD medications. SLP confirmed MBSS scheduled for tomorrow at 11:30 AM to aid recall. Targeted improving vocal quality and increasing intensity through progressively difficulty speech tasks using Speak Out! program, lesson 2. ST leads pt through exercises providing usual model prior to pt execution. Occasional min-A required to achieve target dB this date. Averages this date: loud "ah" 89 dB; reading 75 dB; cognitive speech task 75 dB. Side comments and conversational sample of approx 3-5 minutes, pt averages 70 dB with usual min-A.   02-23-22: Pt demonstrating and reporting cognitive deficits today related to reduced short term recall, attention, and problem solving. Discussed some recommendations to aid recall of recent information and problem solving for medication management (call MD for specific instructions). Initiated education and instruction of Speak Out! 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 Targeted improving vocal quality and increasing intensity through progressively difficulty speech tasks using Speak Out! program, lesson 1. ST leads pt through exercises providing usual model prior to pt execution. Usual min-A required to achieve target dB this date. Averages this date: loud "ah" 92 dB; reading 77 dB; cognitive speech task 75 dB. Conversational sample of approx 3 minutes, pt averages 72 dB with  occasional min-A.   02-21-22: Initiated training in HEP for dysarthria and compensations for dysfluency. Educated pt re: recommendation for MBSS. Loud /a/ and oral reading with occasional min to mod A. Provided information on Speak Out and how to order the Speak out work book     PATIENT EDUCATION: Education details: See above (and patient instructions) Person educated: Patient Education method: Explanation, Demonstration, Verbal cues, and Handouts Education comprehension: verbalized understanding, returned demonstration, verbal cues required, and needs further education     HOME EXERCISE PROGRAM: Speak Out     GOALS: Goals reviewed with patient? Yes   SHORT TERM GOALS: Target date: 03/26/22 Pt will complete HEP for dysarthria with occasional min A over 2 sessions Baseline: Goal status: ongoing   2.  Pt will average 70dB 18/20 sentences with rare min A over 2 sessions Baseline:  Goal status: ongoing   3.  Pt will average 70dB over 8 minute simple conversation with occasional min A over 2 sessions Baseline:  Goal status: ongoing   4.  Pt will complete MBSS Baseline:  Goal status: ongoing   5.  Pt will follow diet modifications and swallow precautions after MBSS with occasional min A Baseline:  Goal status: ongoing     LONG TERM GOALS: Target date: 05/16/22   Pt will complete HEP for  dysarthria with rare min A over 2 sessions Baseline:  Goal status: ongoing   2.  Pt will complete HEP for dysphagia if indicated s/p MBSS with occasional min A Baseline:  Goal status: ongoing   3.  Pt will average 70 dB during 15 minute conversation with rare min A over 2 sessions Baseline:  Goal status: ongoing   4.  Pt will improve score on Communicative Effectiveness Survey Baseline: 13/32 Goal status: ongoing     ASSESSMENT:   CLINICAL IMPRESSION: Patient is a 76 y.o. male who was seen today for dysphagia and dysarthria. Conducted ongoing education and training of Speak Out!  Program to maximize patient vocal intensity and clarity. MBSS scheduled for tomorrow to assess dysphagia given patient reports. Recommend skilled ST to maximize intelligibility for safety, QOL and safety of swallow.    OBJECTIVE IMPAIRMENTS  Objective impairments include memory, dysphagia, and dysarthria. These impairments are limiting patient from effectively communicating at home and in community and safety when swallowing. Factors affecting potential to achieve goals and functional outcome are medical prognosis. Patient will benefit from skilled SLP services to address above impairments and improve overall function.   REHAB POTENTIAL: Good   PLAN: SLP FREQUENCY: 2x/week   SLP DURATION: 12 weeks   PLANNED INTERVENTIONS: Aspiration precaution training, Pharyngeal strengthening exercises, Diet toleration management , Language facilitation, Environmental controls, Trials of upgraded texture/liquids, Cueing hierachy, Cognitive reorganization, Internal/external aids, Functional tasks, Multimodal communication approach, and SLP instruction and feedback    Marzetta Board, CCC-SLP 03/01/2022, 9:33 AM

## 2022-03-02 ENCOUNTER — Ambulatory Visit (HOSPITAL_COMMUNITY)
Admission: RE | Admit: 2022-03-02 | Discharge: 2022-03-02 | Disposition: A | Discharge status: Home / Self Care | Payer: No Typology Code available for payment source | Source: Ambulatory Visit | Attending: Internal Medicine | Admitting: Internal Medicine

## 2022-03-02 DIAGNOSIS — R1310 Dysphagia, unspecified: Secondary | ICD-10-CM

## 2022-03-02 DIAGNOSIS — R1312 Dysphagia, oropharyngeal phase: Secondary | ICD-10-CM | POA: Insufficient documentation

## 2022-03-02 NOTE — Progress Notes (Signed)
Modified Barium Swallow Progress Note  Patient Details  Name: Lee [name] MRN: [id number] Date of Birth: 28-Jul-1946  Today's Date: 03/02/2022  Modified Barium Swallow completed.  Full report located under Chart Review in the Imaging Section.  Brief recommendations include the following:  Clinical Impression  Pt presents with grossly functional oropharyngeal swallowing. He does have intermittent pharyngeal residue primarily with liquids, but it is a very small amount, located at either his valleculae or pyriform sinuses, and clears spontaneously as he continues to swallow. Pt does note that he thinks there are times when he is swallowing better than others. Question if this could be in part related to timing around his medications, as he says that he just recently learned about the specific times he is supposed to take his carbidopa-levidopa. Howver, also question if he could be having some more esophageal symptoms, given his reported hx as well as complaints with solids and possible regurgitation of juice recently. Could consider a more esophageal assessment if these symptoms persist.   Swallow Evaluation Recommendations       SLP Diet Recommendations: Regular solids;Thin liquid   Liquid Administration via: Cup;Straw   Medication Administration: Whole meds with liquid   Supervision: Patient able to self feed   Compensations: Slow rate;Small sips/bites   Postural Changes: Seated upright at 90 degrees;Remain semi-upright after after feeds/meals (Comment)   Oral Care Recommendations: Oral care BID        Osie Bond., M.A. [place] Office [phone]  Secure chat preferred  03/02/2022,3:05 PM

## 2022-03-05 ENCOUNTER — Encounter: Payer: Self-pay | Admitting: Speech Pathology

## 2022-03-05 ENCOUNTER — Ambulatory Visit: Payer: No Typology Code available for payment source | Admitting: Speech Pathology

## 2022-03-05 ENCOUNTER — Ambulatory Visit: Payer: No Typology Code available for payment source | Admitting: Physical Therapy

## 2022-03-05 DIAGNOSIS — R41841 Cognitive communication deficit: Secondary | ICD-10-CM

## 2022-03-05 DIAGNOSIS — R1312 Dysphagia, oropharyngeal phase: Secondary | ICD-10-CM

## 2022-03-05 DIAGNOSIS — R2681 Unsteadiness on feet: Secondary | ICD-10-CM

## 2022-03-05 DIAGNOSIS — M6281 Muscle weakness (generalized): Secondary | ICD-10-CM | POA: Diagnosis not present

## 2022-03-05 DIAGNOSIS — R29818 Other symptoms and signs involving the nervous system: Secondary | ICD-10-CM | POA: Diagnosis not present

## 2022-03-05 DIAGNOSIS — R278 Other lack of coordination: Secondary | ICD-10-CM

## 2022-03-05 DIAGNOSIS — R471 Dysarthria and anarthria: Secondary | ICD-10-CM

## 2022-03-05 DIAGNOSIS — R293 Abnormal posture: Secondary | ICD-10-CM

## 2022-03-05 DIAGNOSIS — R2689 Other abnormalities of gait and mobility: Secondary | ICD-10-CM | POA: Diagnosis not present

## 2022-03-05 DIAGNOSIS — R29898 Other symptoms and signs involving the musculoskeletal system: Secondary | ICD-10-CM | POA: Diagnosis not present

## 2022-03-05 DIAGNOSIS — R251 Tremor, unspecified: Secondary | ICD-10-CM | POA: Diagnosis not present

## 2022-03-05 NOTE — Therapy (Signed)
OUTPATIENT PHYSICAL THERAPY NEURO TREATMENT   Patient Name: Lee [name] MRN: [id number] DOB:09/19/1945, 76 y.o., male Today's Date: 03/05/2022   PCP: Cassandria Anger, MD REFERRING PROVIDER: Harvie Heck, MD    PT End of Session - 03/05/22 1324     Visit Number 4    Number of Visits 16    Date for PT Re-Evaluation 05/22/22    Authorization Type [facility] - 15 visits approved from 12/29/21 - 04/28/22    Authorization - Visit Number 1    Authorization - Number of Visits 15    PT Start Time 1322   Pt arrived late   PT Stop Time 1402    PT Time Calculation (min) 40 min    Activity Tolerance Patient tolerated treatment well;Patient limited by fatigue    Behavior During Therapy [facility] for tasks assessed/performed   easily distracted             Past Medical History:  Diagnosis Date   Acid reflux    Alcohol abuse    quit 10 y ago   Arthritis    BPH (benign prostatic hyperplasia)    CAD (coronary artery disease)    Cervical spondylosis without myelopathy 06/02/2015   COPD (chronic obstructive pulmonary disease) ([place])    Emphysema lung ([place])    GERD (gastroesophageal reflux disease)    High cholesterol    Hypertension    Parkinson's disease ([place])    PVD (peripheral vascular disease) ([place])    Tremor 06/02/2015   Past Surgical History:  Procedure Laterality Date   TONSILLECTOMY     VASECTOMY     Patient Active Problem List   Diagnosis Date Noted   Leukopenia 02/28/2022   Acquired deformities of toe(s), unspecified, unspecified foot 11/27/2021   Asthma 11/27/2021   Cervicalgia 11/27/2021   Corns and callosities 11/27/2021   Disturbance of skin sensation 11/27/2021   Elevated prostate specific antigen (PSA) 11/27/2021   Excessive attrition of teeth 11/27/2021   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   Family history of cancer 11/27/2021   Family history of malignant neoplasm of prostate 11/27/2021   Hyperlipidemia 11/27/2021   Iron deficiency anemia,  unspecified 11/27/2021   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[place]) 11/27/2021   Myalgia 11/27/2021   Nail dystrophy 11/27/2021   Benign neoplasm of other parts of mouth 11/27/2021   Open angle with borderline findings, high risk, left eye 11/27/2021   Other abnormal glucose 11/27/2021   Other reduced mobility 11/27/2021   Other secondary cataract, bilateral 11/27/2021   Pain in right thigh 11/27/2021   Pain in right knee 11/27/2021   Pain in unspecified shoulder 11/27/2021   Persons encountering health services in other specified circumstances 11/27/2021   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/27/2021   Right lower quadrant pain 11/27/2021   Tinnitus 11/27/2021   Trochanteric bursitis, right hip 11/27/2021   Unilateral primary osteoarthritis, right hip 11/27/2021   Unspecified fall, initial encounter 11/27/2021   Vitamin D deficiency, unspecified 11/27/2021   Weakness 11/27/2021   Constipation 08/28/2021   Chronic obstructive pulmonary disease ([place]) 05/31/2021   Depression 04/13/2021   Parkinson's disease ([place]) 02/28/2021   Weakness of both legs 02/01/2021   Dysphagia 02/01/2021   Weakness of right leg 01/06/2021   Lumbar radiculopathy 12/30/2020   Prostate cancer ([place]) 06/09/2020   Well adult exam 06/04/2019   BPH (benign prostatic hyperplasia) 06/04/2019   Bilateral carotid bruits 06/04/2019   Meniere disease, 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  Emphysema of lung ([place]) 06/04/2019   Spondylolisthesis at L4-L5 level 08/27/2017   Spinal stenosis in cervical region 08/27/2017   Tremor 06/02/2015   Cervical spondylosis without myelopathy 06/02/2015   Alcoholism in remission ([place]) 06/09/2014   Coronary arteriosclerosis 07/30/2009   History of colonoscopy 07/30/2009   Neutropenia ([place]) 07/30/2008   HIP PAIN 11/27/2007   Essential hypertension 07/25/2007   Atrial fibrillation ([place]) 07/25/2007   GERD 07/25/2007    ERECTILE DYSFUNCTION 07/25/2007   LOW BACK PAIN 07/25/2007    ONSET DATE: 12/29/2021   REFERRING DIAG: G20 (ICD-10-CM) - Parkinson disease ([place])   THERAPY DIAG:  Unsteadiness on feet  Other lack of coordination  Abnormal posture  Rationale for Evaluation and Treatment Rehabilitation  SUBJECTIVE:                                                                                                                                                                                              SUBJECTIVE STATEMENT: Pt reports he took his medication this morning but missed his most recent dosage due to "running around for Dr. Thomasene Lot". No new changes   Pt accompanied by: self  PERTINENT HISTORY: PMH:  neck and back pain, PD -dx in March 2022 per pt report, hx of ETOH abuse, CAD, COPD, emphysema, GERD, HTN, high cholesterol, PVD, pt with hx of being non-compliant with meds  PAIN:  Are you having pain? Yes: NPRS scale: 2-3/10 Pain location: RLE - R knee/quad, R hip Pain description: Dull Aggravating factors: Depends how his body is like, pressure Relieving factors: Sitting up taller, tumeric  PRECAUTIONS: Fall  FALLS: Has patient fallen in last 6 months?  No falls to the ground, but stumbles all the time, can't keep count, normally can catch himself.  Had fall on 7/31 in home - first fall in which he was not able to catch himself   PLOF: Independent with community mobility with device  PATIENT GOALS Wants to get rid of the PD and go back to where he was 2-3 years ago. Get back to walking in the park.   OBJECTIVE:   TODAY'S TREATMENT:  NMR  Reviewed and updated HEP from previous bout of therapy (see bolded below) for improved lateral weight shifting, turns and transfers:   - Alternating Step Backward with Support, x10 reps per side w/min cues for increased step length. Noted increased difficulty when performing on R>L   - Alternating Step Forward with Support, x10 reps per  side w/unilateral support on counter   - Step Sideways, down and back length of counter x2. Min cues for increased step length and clearance (RLE >  LLE)  - Standing Quarter Turn with Counter Support, x10 per side w/min cues for proper sequencing and increased step clearance, as pt sliding R foot on ground   - Sit to Stand Without Arm Support, x10 reps w/min multimodal cues for proper body mechanics and performance    Ther Ex  SciFit multi-peaks level 6 for 8 minutes using BUE/BLEs for neural priming for reciprocal movement, dynamic cardiovascular conditioning and increased amplitude of stepping. RPE of 10/10 following activity.  Gait pattern: step through pattern, decreased arm swing- Left, decreased step length- Right, decreased step length- Left, decreased stride length, festinating, and trunk flexed Distance walked: Various clinic distances  Assistive device utilized: Single point cane Level of assistance: SBA and CGA Comments: Noted decreased festination/freezing episodes today     PATIENT EDUCATION: Education details: Updates to HEP Person educated: Patient Education method: Consulting civil engineer, Media planner, and Handouts Education comprehension: verbalized understanding, returned demonstration, and needs further education   HOME EXERCISE PROGRAM: [email or web address], Seated and Standing PWR moves   Access Code: [email or web address] URL: [email or web address] Date: 03/05/2022 Prepared by: Mickie Bail Plaster  Exercises - Alternating Step Backward with Support  - 1 x daily - 5 x weekly - 1 sets - 10 reps - Alternating Step Forward with Support  - 1 x daily - 5 x weekly - 1 sets - 10 reps - Step Sideways  - 1 x daily - 5 x weekly - 1 sets - 10 reps - Standing Quarter Turn with Counter Support  - 1 x daily - 5 x weekly - 1 sets - 5 reps - Sit to Stand Without Arm Support  - 1 x daily - 7 x weekly - 3 sets - 10 reps    GOALS: Goals reviewed with patient? Yes  SHORT TERM GOALS: Target  date: 03/21/2022  Pt will be independent with initial HEP for improved strength, balance, transfers, and gait.  Baseline: Goal status: INITIAL  2.  Pt will verbalize tips to reduce festination/freezing with gait and turns.  Baseline:  Goal status: INITIAL  3.  Pt will verbalize understanding of local Parkinson's disease resources.  Baseline:  Goal status: INITIAL  4.  Push and release test to be assessed with LTG written for improved balance recovery. Baseline: Anterior = 4-5 small steps Posterior = 5-6 small steps, min A for steadying Lateral = unable to elicit a step in either direction.  Goal status: MET  5.   Pt will improve 5x sit<>stand with BUE support to less than or equal to 22 sec to demonstrate improved functional strength and transfer efficiency.  Baseline: 26.57 seconds with BUE support  Goal status: INITIAL    LONG TERM GOALS: Target date: 04/18/2022  Pt will be independent with final HEP for improved strength, balance, transfers, and gait.  Baseline:  Goal status: INITIAL  2.   Pt will improve gait velocity to at least 2.4 ft/sec with LRAD for improved gait efficiency and safety.   Baseline: 14.96 seconds with SPC = 2.19 ft/sec Goal status: INITIAL  3.   Pt will ambulate at least 500' ft indoor and outdoor surfaces, appropriate assistive device with supervision for imrpoved community gait.   Baseline:  Goal status: INITIAL  4.  Pt will recover posterior and anterior balance in push and release test in 2-3 or less steps independently, for improved balance recovery.   Baseline: Anterior = 4-5 small steps Posterior = 5-6 small steps, min A for steadying Goal status: INITIAL  5.  Pt will improve 5x sit<>stand  with BUE support to less than or equal to 22 sec to demonstrate improved functional strength and transfer efficiency. Baseline: 26.57 seconds with BUE support  Goal status: INITIAL  6.  Pt will improve cog TUG to 17 seconds or less in order to demo  improved dual tasking/decr fall risk.  Baseline: 22.38 sec with SPC  Goal status: INITIAL    ASSESSMENT:  CLINICAL IMPRESSION: Emphasis of skilled PT session on updating and reviewing HEP for improved turns, transfers and BLE coordination. Session limited due to pt arriving late and pt fatigue. Pt did not take medication prior to session due to having multiple appointments earlier in day but demonstrated reduced frequency of freezing today compared to previous sessions. Continue POC.    OBJECTIVE IMPAIRMENTS Abnormal gait, decreased activity tolerance, decreased balance, decreased cognition, decreased coordination, decreased endurance, decreased knowledge of use of DME, difficulty walking, decreased strength, decreased safety awareness, impaired flexibility, impaired sensation, impaired tone, postural dysfunction, and pain.   ACTIVITY LIMITATIONS stairs, transfers, bed mobility, and locomotion level  PARTICIPATION LIMITATIONS: cleaning and community activity  PERSONAL FACTORS Behavior pattern, Past/current experiences, Time since onset of injury/illness/exacerbation, and 3+ comorbidities: neck and back pain, PD -dx in March 2022 per pt report, hx of ETOH abuse, CAD, COPD, emphysema, GERD, HTN, high cholesterol, PVD  are also affecting patient's functional outcome.   REHAB POTENTIAL: Good  CLINICAL DECISION MAKING: Evolving/moderate complexity  EVALUATION COMPLEXITY: Moderate  PLAN: PT FREQUENCY: 2x/week  PT DURATION: 12 weeks  PLANNED INTERVENTIONS: Therapeutic exercises, Therapeutic activity, Neuromuscular re-education, Balance training, Gait training, Patient/Family education, Self Care, Stair training, DME instructions, and Re-evaluation  PLAN FOR NEXT SESSION: Did pt take medication? Pt has treadmill at home and wants to try it in clinic. Stepping strategies for balance, weight shifting/SLS, education on techniques for freezing/festination episodes.    Cruzita Lederer Kamariya Blevens, PT,  DPT  03/05/2022, 2:06 PM

## 2022-03-05 NOTE — Therapy (Deleted)
OUTPATIENT SPEECH LANGUAGE PATHOLOGY TREATMENT NOTE   Patient Name: Lee [name] MRN: [id number] DOB:1945/09/02, 76 y.o., male Today's Date: 03/05/2022  PCP: Dr. Lew Dawes  REFERRING PROVIDER: Harvie Heck, MD   END OF SESSION:     Past Medical History:  Diagnosis Date   Acid reflux    Alcohol abuse    quit 10 y ago   Arthritis    BPH (benign prostatic hyperplasia)    CAD (coronary artery disease)    Cervical spondylosis without myelopathy 06/02/2015   COPD (chronic obstructive pulmonary disease) ([place])    Emphysema lung ([place])    GERD (gastroesophageal reflux disease)    High cholesterol    Hypertension    Parkinson's disease ([place])    PVD (peripheral vascular disease) ([place])    Tremor 06/02/2015   Past Surgical History:  Procedure Laterality Date   TONSILLECTOMY     VASECTOMY     Patient Active Problem List   Diagnosis Date Noted   Leukopenia 02/28/2022   Acquired deformities of toe(s), unspecified, unspecified foot 11/27/2021   Asthma 11/27/2021   Cervicalgia 11/27/2021   Corns and callosities 11/27/2021   Disturbance of skin sensation 11/27/2021   Elevated prostate specific antigen (PSA) 11/27/2021   Excessive attrition of teeth 11/27/2021   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   Family history of cancer 11/27/2021   Family history of malignant neoplasm of prostate 11/27/2021   Hyperlipidemia 11/27/2021   Iron deficiency anemia, unspecified 11/27/2021   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[place]) 11/27/2021   Myalgia 11/27/2021   Nail dystrophy 11/27/2021   Benign neoplasm of other parts of mouth 11/27/2021   Open angle with borderline findings, high risk, left eye 11/27/2021   Other abnormal glucose 11/27/2021   Other reduced mobility 11/27/2021   Other secondary cataract, bilateral 11/27/2021   Pain in right thigh 11/27/2021   Pain in right knee 11/27/2021   Pain in unspecified shoulder 11/27/2021   Persons encountering health services  in other specified circumstances 11/27/2021   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/27/2021   Right lower quadrant pain 11/27/2021   Tinnitus 11/27/2021   Trochanteric bursitis, right hip 11/27/2021   Unilateral primary osteoarthritis, right hip 11/27/2021   Unspecified fall, initial encounter 11/27/2021   Vitamin D deficiency, unspecified 11/27/2021   Weakness 11/27/2021   Constipation 08/28/2021   Chronic obstructive pulmonary disease ([place]) 05/31/2021   Depression 04/13/2021   Parkinson's disease ([place]) 02/28/2021   Weakness of both legs 02/01/2021   Dysphagia 02/01/2021   Weakness of right leg 01/06/2021   Lumbar radiculopathy 12/30/2020   Prostate cancer ([place]) 06/09/2020   Well adult exam 06/04/2019   BPH (benign prostatic hyperplasia) 06/04/2019   Bilateral carotid bruits 06/04/2019   Meniere disease, 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   Emphysema of lung ([place]) 06/04/2019   Spondylolisthesis at L4-L5 level 08/27/2017   Spinal stenosis in cervical region 08/27/2017   Tremor 06/02/2015   Cervical spondylosis without myelopathy 06/02/2015   Alcoholism in remission ([place]) 06/09/2014   Coronary arteriosclerosis 07/30/2009   History of colonoscopy 07/30/2009   Neutropenia ([place]) 07/30/2008   HIP PAIN 11/27/2007   Essential hypertension 07/25/2007   Atrial fibrillation ([place]) 07/25/2007   GERD 07/25/2007   ERECTILE DYSFUNCTION 07/25/2007   LOW BACK PAIN 07/25/2007    ONSET DATE: March 2022 PD dx  REFERRING DIAG:  R13.10 (ICD-10-CM) - Dysphagia   THERAPY DIAG:  No diagnosis found.  Rationale for Evaluation and Treatment Rehabilitation  SUBJECTIVE: "***  PAIN:  Are you having pain? No  OBJECTIVE:      DIAGNOSTIC FINDINGS: MBSS 03-02-22 Pt presents with grossly functional oropharyngeal swallowing. He does have intermittent pharyngeal residue primarily with liquids, but it is a very small amount, located at  either his valleculae or pyriform sinuses, and clears spontaneously as he continues to swallow. Pt does note that he thinks there are times when he is swallowing better than others. Question if this could be in part related to timing around his medications, as he says that he just recently learned about the specific times he is supposed to take his carbidopa-levidopa. Howver, also question if he could be having some more esophageal symptoms, given his reported hx as well as complaints with solids and possible regurgitation of juice recently. Could consider a more esophageal assessment if these symptoms persist. SLP Diet Recommendations: Regular solids;Thin liquid  Liquid Administration via: Cup;Straw  Medication Administration: Whole meds with liquid  Supervision: Patient able to self feed  Compensations: Slow rate;Small sips/bites  Postural Changes: Seated upright at 90 degrees;Remain semi-upright after after feeds/meals   Oral Care Recommendations: Oral care BID   TODAY'S TREATMENT:  03-05-22:  Educated patient on results of MBSS completed 03-02-22, revealing grossly WNL swallow function for oropharyngeal phases. Discussed recommended compensatory swallow strategies to maximize safety during swallowing.      03-01-22: Pt entered with decreased vocal clarity and intensity this session (mid to upper 60s dB) requiring intermittent cued repetition to optimize listener comprehension. Pt reported feeling fatigued and light headed following PT session, which improved with cued abdominal breathing and rest break. Pt able to recall conversation with MD re: when to take PD medications. SLP confirmed MBSS scheduled for tomorrow at 11:30 AM to aid recall. Targeted improving vocal quality and increasing intensity through progressively difficulty speech tasks using Speak Out! program, lesson 2. ST leads pt through exercises providing usual model prior to pt execution. Occasional min-A required to achieve target dB this  date. Averages this date: loud "ah" 89 dB; reading 75 dB; cognitive speech task 75 dB. Side comments and conversational sample of approx 3-5 minutes, pt averages 70 dB with usual min-A.   02-23-22: Pt demonstrating and reporting cognitive deficits today related to reduced short term recall, attention, and problem solving. Discussed some recommendations to aid recall of recent information and problem solving for medication management (call MD for specific instructions). Initiated education and instruction of Speak Out! 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 Targeted improving vocal quality and increasing intensity through progressively difficulty speech tasks using Speak Out! program, lesson 1. ST leads pt through exercises providing usual model prior to pt execution. Usual min-A required to achieve target dB this date. Averages this date: loud "ah" 92 dB; reading 77 dB; cognitive speech task 75 dB. Conversational sample of approx 3 minutes, pt averages 72 dB with occasional min-A.   02-21-22: Initiated training in HEP for dysarthria and compensations for dysfluency. Educated pt re: recommendation for MBSS. Loud /a/ and oral reading with occasional min to mod A. Provided information on Speak Out and how to order the Speak out work book     PATIENT EDUCATION: Education details: See above (and patient instructions) Person educated: Patient Education method: Explanation, Demonstration, Verbal cues, and Handouts Education comprehension: verbalized understanding, returned demonstration, verbal cues required, and needs further education     HOME EXERCISE PROGRAM: Speak Out     GOALS: Goals reviewed with patient? Yes  SHORT TERM GOALS: Target date: 03/26/22 Pt will complete HEP for dysarthria with occasional min A over 2 sessions Baseline: Goal status: ongoing   2.  Pt will average 70dB 18/20 sentences with rare min A over 2 sessions Baseline:  Goal  status: ongoing   3.  Pt will average 70dB over 8 minute simple conversation with occasional min A over 2 sessions Baseline:  Goal status: ongoing   4.  Pt will complete MBSS Baseline:  Goal status: ongoing   5.  Pt will follow diet modifications and swallow precautions after MBSS with occasional min A Baseline:  Goal status: ongoing     LONG TERM GOALS: Target date: 05/16/22   Pt will complete HEP for dysarthria with rare min A over 2 sessions Baseline:  Goal status: ongoing   2.  Pt will complete HEP for dysphagia if indicated s/p MBSS with occasional min A Baseline:  Goal status: ongoing   3.  Pt will average 70 dB during 15 minute conversation with rare min A over 2 sessions Baseline:  Goal status: ongoing   4.  Pt will improve score on Communicative Effectiveness Survey Baseline: 13/32 Goal status: ongoing     ASSESSMENT:   CLINICAL IMPRESSION: Patient is a 76 y.o. male who was seen today for dysphagia and dysarthria. Conducted ongoing education and training of Speak Out! Program to maximize patient vocal intensity and clarity. MBSS scheduled for tomorrow to assess dysphagia given patient reports. Recommend skilled ST to maximize intelligibility for safety, QOL and safety of swallow.    OBJECTIVE IMPAIRMENTS  Objective impairments include memory, dysphagia, and dysarthria. These impairments are limiting patient from effectively communicating at home and in community and safety when swallowing. Factors affecting potential to achieve goals and functional outcome are medical prognosis. Patient will benefit from skilled SLP services to address above impairments and improve overall function.   REHAB POTENTIAL: Good   PLAN: SLP FREQUENCY: 2x/week   SLP DURATION: 12 weeks   PLANNED INTERVENTIONS: Aspiration precaution training, Pharyngeal strengthening exercises, Diet toleration management , Language facilitation, Environmental controls, Trials of upgraded  texture/liquids, Cueing hierachy, Cognitive reorganization, Internal/external aids, Functional tasks, Multimodal communication approach, and SLP instruction and feedback    Marzetta Board, CCC-SLP 03/05/2022, 8:55 AM

## 2022-03-05 NOTE — Therapy (Signed)
OUTPATIENT SPEECH LANGUAGE PATHOLOGY TREATMENT NOTE   Patient Name: Lee [name] MRN: [id number] DOB:07/03/1946, 76 y.o., male Today's Date: 03/05/2022  PCP: Dr. Lew Dawes  REFERRING PROVIDER: Harvie Heck, MD   END OF SESSION:   End of Session - 03/05/22 1407     Visit Number 4    Number of Visits 25    Date for SLP Re-Evaluation 05/16/22    SLP Start Time 35    SLP Stop Time  1430   pt left early to use restroom   SLP Time Calculation (min) 30 min    Activity Tolerance Patient tolerated treatment well;Patient limited by fatigue             Past Medical History:  Diagnosis Date   Acid reflux    Alcohol abuse    quit 10 y ago   Arthritis    BPH (benign prostatic hyperplasia)    CAD (coronary artery disease)    Cervical spondylosis without myelopathy 06/02/2015   COPD (chronic obstructive pulmonary disease) ([place])    Emphysema lung ([place])    GERD (gastroesophageal reflux disease)    High cholesterol    Hypertension    Parkinson's disease ([place])    PVD (peripheral vascular disease) ([place])    Tremor 06/02/2015   Past Surgical History:  Procedure Laterality Date   TONSILLECTOMY     VASECTOMY     Patient Active Problem List   Diagnosis Date Noted   Leukopenia 02/28/2022   Acquired deformities of toe(s), unspecified, unspecified foot 11/27/2021   Asthma 11/27/2021   Cervicalgia 11/27/2021   Corns and callosities 11/27/2021   Disturbance of skin sensation 11/27/2021   Elevated prostate specific antigen (PSA) 11/27/2021   Excessive attrition of teeth 11/27/2021   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   Family history of cancer 11/27/2021   Family history of malignant neoplasm of prostate 11/27/2021   Hyperlipidemia 11/27/2021   Iron deficiency anemia, unspecified 11/27/2021   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[place]) 11/27/2021   Myalgia 11/27/2021   Nail dystrophy 11/27/2021   Benign neoplasm of other parts of mouth 11/27/2021   Open angle  with borderline findings, high risk, left eye 11/27/2021   Other abnormal glucose 11/27/2021   Other reduced mobility 11/27/2021   Other secondary cataract, bilateral 11/27/2021   Pain in right thigh 11/27/2021   Pain in right knee 11/27/2021   Pain in unspecified shoulder 11/27/2021   Persons encountering health services in other specified circumstances 11/27/2021   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/27/2021   Right lower quadrant pain 11/27/2021   Tinnitus 11/27/2021   Trochanteric bursitis, right hip 11/27/2021   Unilateral primary osteoarthritis, right hip 11/27/2021   Unspecified fall, initial encounter 11/27/2021   Vitamin D deficiency, unspecified 11/27/2021   Weakness 11/27/2021   Constipation 08/28/2021   Chronic obstructive pulmonary disease ([place]) 05/31/2021   Depression 04/13/2021   Parkinson's disease ([place]) 02/28/2021   Weakness of both legs 02/01/2021   Dysphagia 02/01/2021   Weakness of right leg 01/06/2021   Lumbar radiculopathy 12/30/2020   Prostate cancer ([place]) 06/09/2020   Well adult exam 06/04/2019   BPH (benign prostatic hyperplasia) 06/04/2019   Bilateral carotid bruits 06/04/2019   Meniere disease, 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   Emphysema of lung ([place]) 06/04/2019   Spondylolisthesis at L4-L5 level 08/27/2017   Spinal stenosis in cervical region 08/27/2017   Tremor 06/02/2015   Cervical spondylosis without myelopathy 06/02/2015   Alcoholism in remission ([place])  06/09/2014   Coronary arteriosclerosis 07/30/2009   History of colonoscopy 07/30/2009   Neutropenia ([place]) 07/30/2008   HIP PAIN 11/27/2007   Essential hypertension 07/25/2007   Atrial fibrillation ([place]) 07/25/2007   GERD 07/25/2007   ERECTILE DYSFUNCTION 07/25/2007   LOW BACK PAIN 07/25/2007    ONSET DATE: March 2022 PD dx  REFERRING DIAG:  R13.10 (ICD-10-CM) - Dysphagia   THERAPY DIAG:  Dysarthria and  anarthria  Dysphagia, oropharyngeal phase  Cognitive communication deficit  Rationale for Evaluation and Treatment Rehabilitation  SUBJECTIVE: "I'm tired, I had an appointment at 8:00, then 11:00 then 1:15 here""  PAIN:  Are you having pain? Yes 4/10 right leg and back  OBJECTIVE:   TODAY'S TREATMENT:   03-05-22: Pt enters with reduced volume and intelligibility (mid 60's dB). Reviewed MBSS with [name], he verbalizes frustration with normal MBSS as he continues to get choked on water. General swallow precautions reviewed. Targeted improving vocal quality and increasing intensity through progressively difficulty speech tasks using Speak Out! program, lesson 2. ST leads pt through exercises providing usual model prior to pt execution. Occasional min-A required to achieve target dB this date. Averages this date: loud "ah" 89 dB; reading 78 dB; cognitive speech task 75 dB. Side comments and conversational sample of approx 3-5 minutes, pt averages 70 dB with usual min-A.  03-01-22: Pt entered with decreased vocal clarity and intensity this session (mid to upper 60s dB) requiring intermittent cued repetition to optimize listener comprehension. Pt reported feeling fatigued and light headed following PT session, which improved with cued abdominal breathing and rest break. Pt able to recall conversation with MD re: when to take PD medications. SLP confirmed MBSS scheduled for tomorrow at 11:30 AM to aid recall. Targeted improving vocal quality and increasing intensity through progressively difficulty speech tasks using Speak Out! program, lesson 2. ST leads pt through exercises providing usual model prior to pt execution. Occasional min-A required to achieve target dB this date. Averages this date: loud "ah" 89 dB; reading 75 dB; cognitive speech task 75 dB. Side comments and conversational sample of approx 3-5 minutes, pt averages 70 dB with usual min-A.   02-23-22: Pt demonstrating and reporting cognitive  deficits today related to reduced short term recall, attention, and problem solving. Discussed some recommendations to aid recall of recent information and problem solving for medication management (call MD for specific instructions). Initiated education and instruction of Speak Out! 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 Targeted improving vocal quality and increasing intensity through progressively difficulty speech tasks using Speak Out! program, lesson 1. ST leads pt through exercises providing usual model prior to pt execution. Usual min-A required to achieve target dB this date. Averages this date: loud "ah" 92 dB; reading 77 dB; cognitive speech task 75 dB. Conversational sample of approx 3 minutes, pt averages 72 dB with occasional min-A.   02-21-22: Initiated training in HEP for dysarthria and compensations for dysfluency. Educated pt re: recommendation for MBSS. Loud /a/ and oral reading with occasional min to mod A. Provided information on Speak Out and how to order the Speak out work book     PATIENT EDUCATION: Education details: See above (and patient instructions) Person educated: Patient Education method: Explanation, Demonstration, Verbal cues, and Handouts Education comprehension: verbalized understanding, returned demonstration, verbal cues required, and needs further education     HOME EXERCISE PROGRAM: Speak Out     GOALS: Goals reviewed with patient? Yes   SHORT TERM GOALS: Target date:  03/26/22 Pt will complete HEP for dysarthria with occasional min A over 2 sessions Baseline: Goal status: ongoing   2.  Pt will average 70dB 18/20 sentences with rare min A over 2 sessions Baseline:  Goal status: ongoing   3.  Pt will average 70dB over 8 minute simple conversation with occasional min A over 2 sessions Baseline:  Goal status: ongoing   4.  Pt will complete MBSS Baseline:  Goal status: ongoing   5.  Pt will follow diet  modifications and swallow precautions after MBSS with occasional min A Baseline:  Goal status: ongoing     LONG TERM GOALS: Target date: 05/16/22   Pt will complete HEP for dysarthria with rare min A over 2 sessions Baseline:  Goal status: ongoing   2.  Pt will complete HEP for dysphagia if indicated s/p MBSS with occasional min A Baseline:  Goal status: ongoing   3.  Pt will average 70 dB during 15 minute conversation with rare min A over 2 sessions Baseline:  Goal status: ongoing   4.  Pt will improve score on Communicative Effectiveness Survey Baseline: 13/32 Goal status: ongoing     ASSESSMENT:   CLINICAL IMPRESSION: Patient is a 76 y.o. male who was seen today for dysphagia and dysarthria. Conducted ongoing education and training of Speak Out! Program to maximize patient vocal intensity and clarity. MBSS scheduled for tomorrow to assess dysphagia given patient reports. Recommend skilled ST to maximize intelligibility for safety, QOL and safety of swallow.    OBJECTIVE IMPAIRMENTS  Objective impairments include memory, dysphagia, and dysarthria. These impairments are limiting patient from effectively communicating at home and in community and safety when swallowing. Factors affecting potential to achieve goals and functional outcome are medical prognosis. Patient will benefit from skilled SLP services to address above impairments and improve overall function.   REHAB POTENTIAL: Good   PLAN: SLP FREQUENCY: 2x/week   SLP DURATION: 12 weeks   PLANNED INTERVENTIONS: Aspiration precaution training, Pharyngeal strengthening exercises, Diet toleration management , Language facilitation, Environmental controls, Trials of upgraded texture/liquids, Cueing hierachy, Cognitive reorganization, Internal/external aids, Functional tasks, Multimodal communication approach, and SLP instruction and feedback    Eva, Annye Rusk, [place] 03/05/2022, 2:49 PM

## 2022-03-07 ENCOUNTER — Ambulatory Visit: Payer: No Typology Code available for payment source | Admitting: Physical Therapy

## 2022-03-07 ENCOUNTER — Ambulatory Visit: Payer: No Typology Code available for payment source

## 2022-03-07 DIAGNOSIS — R471 Dysarthria and anarthria: Secondary | ICD-10-CM | POA: Diagnosis not present

## 2022-03-07 DIAGNOSIS — M6281 Muscle weakness (generalized): Secondary | ICD-10-CM | POA: Diagnosis not present

## 2022-03-07 DIAGNOSIS — R1312 Dysphagia, oropharyngeal phase: Secondary | ICD-10-CM | POA: Diagnosis not present

## 2022-03-07 DIAGNOSIS — R2681 Unsteadiness on feet: Secondary | ICD-10-CM | POA: Diagnosis not present

## 2022-03-07 DIAGNOSIS — R2689 Other abnormalities of gait and mobility: Secondary | ICD-10-CM | POA: Diagnosis not present

## 2022-03-07 DIAGNOSIS — R251 Tremor, unspecified: Secondary | ICD-10-CM | POA: Diagnosis not present

## 2022-03-07 DIAGNOSIS — R29818 Other symptoms and signs involving the nervous system: Secondary | ICD-10-CM | POA: Diagnosis not present

## 2022-03-07 DIAGNOSIS — R29898 Other symptoms and signs involving the musculoskeletal system: Secondary | ICD-10-CM | POA: Diagnosis not present

## 2022-03-07 DIAGNOSIS — R278 Other lack of coordination: Secondary | ICD-10-CM | POA: Diagnosis not present

## 2022-03-07 DIAGNOSIS — R293 Abnormal posture: Secondary | ICD-10-CM | POA: Diagnosis not present

## 2022-03-07 NOTE — Patient Instructions (Addendum)
Things that may impact your swallowing:  Attention/focus --> you need to focus when you're eating drinking. Be intentional with using safe swallow habits  Medication schedule --> swallowing may be harder if your medication is phasing out of your system Fatigue --> fatigue can make swallowing harder   Swallow strategies:  Taking your time - don't rush  Small bites and chewing well  One sip at a time  Eliminate distractions - turn off television Take a sip of drink every couple bites  You need be sitting up straight to eat and drink  The SLP at the hospital mentioned possibly checking esophageal for any GI issues  Focus on your priorities (1-3 priorities per day) so you feel less stressed

## 2022-03-07 NOTE — Therapy (Signed)
OUTPATIENT SPEECH LANGUAGE PATHOLOGY TREATMENT NOTE   Patient Name: Lee [name] MRN: [id number] DOB:06-28-1946, 76 y.o., male Today's Date: 03/07/2022  PCP: Dr. Lew Dawes  REFERRING PROVIDER: Harvie Heck, MD   END OF SESSION:   End of Session - 03/07/22 1320     Visit Number 5    Number of Visits 25    Date for SLP Re-Evaluation 05/16/22    SLP Start Time 1316    SLP Stop Time  1400    SLP Time Calculation (min) 44 min    Activity Tolerance Patient tolerated treatment well;Patient limited by fatigue              Past Medical History:  Diagnosis Date   Acid reflux    Alcohol abuse    quit 10 y ago   Arthritis    BPH (benign prostatic hyperplasia)    CAD (coronary artery disease)    Cervical spondylosis without myelopathy 06/02/2015   COPD (chronic obstructive pulmonary disease) ([place])    Emphysema lung ([place])    GERD (gastroesophageal reflux disease)    High cholesterol    Hypertension    Parkinson's disease ([place])    PVD (peripheral vascular disease) ([place])    Tremor 06/02/2015   Past Surgical History:  Procedure Laterality Date   TONSILLECTOMY     VASECTOMY     Patient Active Problem List   Diagnosis Date Noted   Leukopenia 02/28/2022   Acquired deformities of toe(s), unspecified, unspecified foot 11/27/2021   Asthma 11/27/2021   Cervicalgia 11/27/2021   Corns and callosities 11/27/2021   Disturbance of skin sensation 11/27/2021   Elevated prostate specific antigen (PSA) 11/27/2021   Excessive attrition of teeth 11/27/2021   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   Family history of cancer 11/27/2021   Family history of malignant neoplasm of prostate 11/27/2021   Hyperlipidemia 11/27/2021   Iron deficiency anemia, unspecified 11/27/2021   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[place]) 11/27/2021   Myalgia 11/27/2021   Nail dystrophy 11/27/2021   Benign neoplasm of other parts of mouth 11/27/2021   Open angle with borderline findings,  high risk, left eye 11/27/2021   Other abnormal glucose 11/27/2021   Other reduced mobility 11/27/2021   Other secondary cataract, bilateral 11/27/2021   Pain in right thigh 11/27/2021   Pain in right knee 11/27/2021   Pain in unspecified shoulder 11/27/2021   Persons encountering health services in other specified circumstances 11/27/2021   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/27/2021   Right lower quadrant pain 11/27/2021   Tinnitus 11/27/2021   Trochanteric bursitis, right hip 11/27/2021   Unilateral primary osteoarthritis, right hip 11/27/2021   Unspecified fall, initial encounter 11/27/2021   Vitamin D deficiency, unspecified 11/27/2021   Weakness 11/27/2021   Constipation 08/28/2021   Chronic obstructive pulmonary disease ([place]) 05/31/2021   Depression 04/13/2021   Parkinson's disease ([place]) 02/28/2021   Weakness of both legs 02/01/2021   Dysphagia 02/01/2021   Weakness of right leg 01/06/2021   Lumbar radiculopathy 12/30/2020   Prostate cancer ([place]) 06/09/2020   Well adult exam 06/04/2019   BPH (benign prostatic hyperplasia) 06/04/2019   Bilateral carotid bruits 06/04/2019   Meniere disease, 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   Emphysema of lung ([place]) 06/04/2019   Spondylolisthesis at L4-L5 level 08/27/2017   Spinal stenosis in cervical region 08/27/2017   Tremor 06/02/2015   Cervical spondylosis without myelopathy 06/02/2015   Alcoholism in remission ([place]) 06/09/2014   Coronary arteriosclerosis 07/30/2009  History of colonoscopy 07/30/2009   Neutropenia ([place]) 07/30/2008   HIP PAIN 11/27/2007   Essential hypertension 07/25/2007   Atrial fibrillation ([place]) 07/25/2007   GERD 07/25/2007   ERECTILE DYSFUNCTION 07/25/2007   LOW BACK PAIN 07/25/2007    ONSET DATE: March 2022 PD dx  REFERRING DIAG:  R13.10 (ICD-10-CM) - Dysphagia   THERAPY DIAG:  Dysarthria and anarthria  Dysphagia, oropharyngeal  phase  Rationale for Evaluation and Treatment Rehabilitation  SUBJECTIVE: "I'm tired and stressed"  PAIN:  Are you having pain? Yes 4/10 right leg and back  OBJECTIVE:   TODAY'S TREATMENT:  03-07-22: Pt c/o feeling stressed related to MBSS results indicated WFL swallow. Again, reviewed results of MBSS, including hospital SLP indicating further esophageal assessment may be warranted given pt complaints. Provided education re: use of intent/focus to optimize swallow function as well as general safe swallow precautions. Pt indicated he does not consistently sit up for all PO intake and often does not drink during meals. SLP explained rationale for related strategies to optimize bolus flow, especially if esophogeal component present. Following discussion, pt noted to consume consecutive sips x4 after education for single, small sips given reports of choking on water. Pt debated SLP instructions given his varied swallowing. Educated possible factors to consider for optimal safety during swallowing (fatigue, medication scheduling, distraction). SLP re-iterated these are general swallow precautions and would benefit patient given significant difficulty self-reported. Pt also endorsed choking on saliva, in which SLP educated techniques for saliva management. Pt has not been completing speech HEP due to stress and fatigue. SLP educated prioritizing tasks for maximal benefits for speech and overall productivity.   03-05-22: Pt enters with reduced volume and intelligibility (mid 60's dB). Reviewed MBSS with [name], he verbalizes frustration with normal MBSS as he continues to get choked on water. General swallow precautions reviewed. Targeted improving vocal quality and increasing intensity through progressively difficulty speech tasks using Speak Out! program, lesson 2. ST leads pt through exercises providing usual model prior to pt execution. Occasional min-A required to achieve target dB this date. Averages this  date: loud "ah" 89 dB; reading 78 dB; cognitive speech task 75 dB. Side comments and conversational sample of approx 3-5 minutes, pt averages 70 dB with usual min-A.  03-01-22: Pt entered with decreased vocal clarity and intensity this session (mid to upper 60s dB) requiring intermittent cued repetition to optimize listener comprehension. Pt reported feeling fatigued and light headed following PT session, which improved with cued abdominal breathing and rest break. Pt able to recall conversation with MD re: when to take PD medications. SLP confirmed MBSS scheduled for tomorrow at 11:30 AM to aid recall. Targeted improving vocal quality and increasing intensity through progressively difficulty speech tasks using Speak Out! program, lesson 2. ST leads pt through exercises providing usual model prior to pt execution. Occasional min-A required to achieve target dB this date. Averages this date: loud "ah" 89 dB; reading 75 dB; cognitive speech task 75 dB. Side comments and conversational sample of approx 3-5 minutes, pt averages 70 dB with usual min-A.   02-23-22: Pt demonstrating and reporting cognitive deficits today related to reduced short term recall, attention, and problem solving. Discussed some recommendations to aid recall of recent information and problem solving for medication management (call MD for specific instructions). Initiated education and instruction of Speak Out! 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 Targeted improving vocal quality and increasing intensity through progressively difficulty speech tasks using Speak Out! program, lesson  1. ST leads pt through exercises providing usual model prior to pt execution. Usual min-A required to achieve target dB this date. Averages this date: loud "ah" 92 dB; reading 77 dB; cognitive speech task 75 dB. Conversational sample of approx 3 minutes, pt averages 72 dB with occasional min-A.   02-21-22: Initiated  training in HEP for dysarthria and compensations for dysfluency. Educated pt re: recommendation for MBSS. Loud /a/ and oral reading with occasional min to mod A. Provided information on Speak Out and how to order the Speak out work book     PATIENT EDUCATION: Education details: See above (and patient instructions) Person educated: Patient Education method: Explanation, Demonstration, Verbal cues, and Handouts Education comprehension: verbalized understanding, returned demonstration, verbal cues required, and needs further education     HOME EXERCISE PROGRAM: Speak Out     GOALS: Goals reviewed with patient? Yes   SHORT TERM GOALS: Target date: 03/26/22 Pt will complete HEP for dysarthria with occasional min A over 2 sessions Baseline: Goal status: ongoing   2.  Pt will average 70dB 18/20 sentences with rare min A over 2 sessions Baseline: 03-07-22 Goal status: ongoing   3.  Pt will average 70dB over 8 minute simple conversation with occasional min A over 2 sessions Baseline:  Goal status: ongoing   4.  Pt will complete MBSS Baseline:  Goal status: Met   5.  Pt will follow diet modifications and swallow precautions after MBSS with occasional min A Baseline:  Goal status: ongoing     LONG TERM GOALS: Target date: 05/16/22   Pt will complete HEP for dysarthria with rare min A over 2 sessions Baseline:  Goal status: ongoing   2.  Pt will complete HEP for dysphagia if indicated s/p MBSS with occasional min A Baseline:  Goal status: ongoing   3.  Pt will average 70 dB during 15 minute conversation with rare min A over 2 sessions Baseline:  Goal status: ongoing   4.  Pt will improve score on Communicative Effectiveness Survey Baseline: 13/32 Goal status: ongoing     ASSESSMENT:   CLINICAL IMPRESSION: Patient is a 76 y.o. male who was seen today for dysphagia and dysarthria. Conducted ongoing education and training of recommended swallow precautions to optimize  safety during swallowing as well as Speak Out! Program to maximize patient vocal intensity and clarity. Recommend skilled ST to maximize intelligibility for safety, QOL and safety of swallow.    OBJECTIVE IMPAIRMENTS  Objective impairments include memory, dysphagia, and dysarthria. These impairments are limiting patient from effectively communicating at home and in community and safety when swallowing. Factors affecting potential to achieve goals and functional outcome are medical prognosis. Patient will benefit from skilled SLP services to address above impairments and improve overall function.   REHAB POTENTIAL: Good   PLAN: SLP FREQUENCY: 2x/week   SLP DURATION: 12 weeks   PLANNED INTERVENTIONS: Aspiration precaution training, Pharyngeal strengthening exercises, Diet toleration management , Language facilitation, Environmental controls, Trials of upgraded texture/liquids, Cueing hierachy, Cognitive reorganization, Internal/external aids, Functional tasks, Multimodal communication approach, and SLP instruction and feedback    Marzetta Board, CCC-SLP 03/07/2022, 1:20 PM

## 2022-03-07 NOTE — Therapy (Signed)
OUTPATIENT PHYSICAL THERAPY NEURO TREATMENT   Patient Name: Lee [name] MRN: [id number] DOB:[birth year], 76 y.o., male Today's Date: 03/07/2022   PCP: Cassandria Anger, MD REFERRING PROVIDER: Harvie Heck, MD    PT End of Session - 03/07/22 1238     Visit Number 5    Number of Visits 16    Date for PT Re-Evaluation 05/22/22    Authorization Type [facility] - 15 visits approved from 12/29/21 - 04/28/22    Authorization - Visit Number 1    Authorization - Number of Visits 15    PT Start Time 1237   pt arrived late   PT Stop Time 1315    PT Time Calculation (min) 38 min    Equipment Utilized During Treatment Gait belt    Activity Tolerance Patient tolerated treatment well    Behavior During Therapy WFL for tasks assessed/performed;Impulsive   easily distracted             Past Medical History:  Diagnosis Date   Acid reflux    Alcohol abuse    quit 10 y ago   Arthritis    BPH (benign prostatic hyperplasia)    CAD (coronary artery disease)    Cervical spondylosis without myelopathy 06/02/2015   COPD (chronic obstructive pulmonary disease) (HCC)    Emphysema lung (HCC)    GERD (gastroesophageal reflux disease)    High cholesterol    Hypertension    Parkinson's disease ([place])    PVD (peripheral vascular disease) ([place])    Tremor 06/02/2015   Past Surgical History:  Procedure Laterality Date   TONSILLECTOMY     VASECTOMY     Patient Active Problem List   Diagnosis Date Noted   Leukopenia 02/28/2022   Acquired deformities of toe(s), unspecified, unspecified foot 11/27/2021   Asthma 11/27/2021   Cervicalgia 11/27/2021   Corns and callosities 11/27/2021   Disturbance of skin sensation 11/27/2021   Elevated prostate specific antigen (PSA) 11/27/2021   Excessive attrition of teeth 11/27/2021   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   Family history of cancer 11/27/2021   Family history of malignant neoplasm of prostate 11/27/2021   Hyperlipidemia 11/27/2021    Iron deficiency anemia, unspecified 11/27/2021   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[place]) 11/27/2021   Myalgia 11/27/2021   Nail dystrophy 11/27/2021   Benign neoplasm of other parts of mouth 11/27/2021   Open angle with borderline findings, high risk, left eye 11/27/2021   Other abnormal glucose 11/27/2021   Other reduced mobility 11/27/2021   Other secondary cataract, bilateral 11/27/2021   Pain in right thigh 11/27/2021   Pain in right knee 11/27/2021   Pain in unspecified shoulder 11/27/2021   Persons encountering health services in other specified circumstances 11/27/2021   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/27/2021   Right lower quadrant pain 11/27/2021   Tinnitus 11/27/2021   Trochanteric bursitis, right hip 11/27/2021   Unilateral primary osteoarthritis, right hip 11/27/2021   Unspecified fall, initial encounter 11/27/2021   Vitamin D deficiency, unspecified 11/27/2021   Weakness 11/27/2021   Constipation 08/28/2021   Chronic obstructive pulmonary disease ([place]) 05/31/2021   Depression 04/13/2021   Parkinson's disease ([place]) 02/28/2021   Weakness of both legs 02/01/2021   Dysphagia 02/01/2021   Weakness of right leg 01/06/2021   Lumbar radiculopathy 12/30/2020   Prostate cancer ([place]) 06/09/2020   Well adult exam 06/04/2019   BPH (benign prostatic hyperplasia) 06/04/2019   Bilateral carotid bruits 06/04/2019  Meniere disease, 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   Emphysema of lung ([place]) 06/04/2019   Spondylolisthesis at L4-L5 level 08/27/2017   Spinal stenosis in cervical region 08/27/2017   Tremor 06/02/2015   Cervical spondylosis without myelopathy 06/02/2015   Alcoholism in remission ([place]) 06/09/2014   Coronary arteriosclerosis 07/30/2009   History of colonoscopy 07/30/2009   Neutropenia ([place]) 07/30/2008   HIP PAIN 11/27/2007   Essential hypertension 07/25/2007   Atrial fibrillation ([place])  07/25/2007   GERD 07/25/2007   ERECTILE DYSFUNCTION 07/25/2007   LOW BACK PAIN 07/25/2007    ONSET DATE: 12/29/2021   REFERRING DIAG: G20 (ICD-10-CM) - Parkinson disease ([place])   THERAPY DIAG:  Unsteadiness on feet  Muscle weakness (generalized)  Other lack of coordination  Other abnormalities of gait and mobility  Rationale for Evaluation and Treatment Rehabilitation  SUBJECTIVE:                                                                                                                                                                                              SUBJECTIVE STATEMENT: Pt reports he is "tired and depressed". No new changes, has not been doing HEP. Pt with toothpick in mouth and difficult to understand at times  Pt accompanied by: self  PERTINENT HISTORY: PMH:  neck and back pain, PD -dx in March 2022 per pt report, hx of ETOH abuse, CAD, COPD, emphysema, GERD, HTN, high cholesterol, PVD, pt with hx of being non-compliant with meds  PAIN:  Are you having pain? Yes: NPRS scale: 2-3/10 Pain location: RLE - R knee/quad, R hip Pain description: Dull Aggravating factors: Depends how his body is like, pressure Relieving factors: Sitting up taller, tumeric  PRECAUTIONS: Fall  FALLS: Has patient fallen in last 6 months?  No falls to the ground, but stumbles all the time, can't keep count, normally can catch himself.  Had fall on 7/31 in home - first fall in which he was not able to catch himself   PLOF: Independent with community mobility with device  PATIENT GOALS Wants to get rid of the PD and go back to where he was 2-3 years ago. Get back to walking in the park.   OBJECTIVE:   TODAY'S TREATMENT:  Self-care/home management  Discussed the following: Importance of performing HEP regularly (pt reported he has been doing it but left his handout in clinic on Monday and could not tell therapist which exercises were on the handout)  Brief discussion and visual  demonstration of freezing strategies, as pt tends to fight through his freezing and lose his balance anteriorly. Pt not in agreement that he is experiencing freezing and  unwilling to listen to therapist regarding freezing strategies. Therapist demonstrated stopping, resetting and taking large step for pt who replied that he's "always done that" when he experiences freezing.    NMR  Gait pattern: step through pattern, decreased arm swing- Left, decreased stride length, decreased hip/knee flexion- Right, decreased hip/knee flexion- Left, decreased ankle dorsiflexion- Right, decreased ankle dorsiflexion- Left, festinating, trunk flexed, poor foot clearance- Right, and poor foot clearance- Left Distance walked: >200' around clinic in hallway and in/out of kitchen  Assistive device utilized: Single point cane Level of assistance: CGA Comments: Practiced ambulating in tight spaces and in doorways to trigger pt's freezing episodes and practice freezing strategies, but pt continues to fight through freezing episodes, requiring CGA for steadying assist due to anterior LOB.   In // bars for improved lateral weight shifting, increased step length/clearance, stepping strategy, set-switching and high amplitude movement: -Fwd 6" hurdle navigation w/BUE support, down and back x4 alternating leading foot each time. Attempted step-through pattern but pt demonstrated significant retropulsion, so regressed to step-to pattern. Mod verbal cues for reduction of circumduction as compensation strategy and to minimize retropulsion. Pt able to perform last rep w/o UE support but required min A for retropulsion correction  -Lateral 6" hurdle navigation w/BUE support, x4 down and back. Pt demonstrated increased difficulty when leading w/RLE compared to L and frequently circumducted LLE over hurdle, requiring min cues for increased hip flexion.  -alt fwd step over 4" beam w/OH slam ball using 2kg ball, x15 per side . Pt had  increased difficulty clearing beam w/RLE >LLE and required mod multimodal cues to maintain sequence of "step, slam, catch, step" throughout. Progressed to adding cog-dual task (naming animals w/each throw) and pt frequently would slam ball without stepping w/added task. CGA throughout for safety.    Gait pattern: step through pattern, decreased arm swing- Left, decreased step length- Right, decreased step length- Left, decreased stride length, festinating, and trunk flexed Distance walked: Various clinic distances  Assistive device utilized: Single point cane Level of assistance: SBA and CGA Comments: Noted increased freezing episodes when pt walking through waiting room    PATIENT EDUCATION: Education details: Continue HEP, see self-care section  Person educated: Patient Education method: Explanation, Demonstration, and Handouts Education comprehension: verbalized understanding, returned demonstration, and needs further education   HOME EXERCISE PROGRAM: [email or web address], Seated and Standing PWR moves   Access Code: [email or web address] URL: [email or web address] Date: 03/05/2022 Prepared by: Mickie Bail Roise Emert  Exercises - Alternating Step Backward with Support  - 1 x daily - 5 x weekly - 1 sets - 10 reps - Alternating Step Forward with Support  - 1 x daily - 5 x weekly - 1 sets - 10 reps - Step Sideways  - 1 x daily - 5 x weekly - 1 sets - 10 reps - Standing Quarter Turn with Counter Support  - 1 x daily - 5 x weekly - 1 sets - 5 reps - Sit to Stand Without Arm Support  - 1 x daily - 7 x weekly - 3 sets - 10 reps    GOALS: Goals reviewed with patient? Yes  SHORT TERM GOALS: Target date: 03/21/2022  Pt will be independent with initial HEP for improved strength, balance, transfers, and gait.  Baseline: Goal status: INITIAL  2.  Pt will verbalize tips to reduce festination/freezing with gait and turns.  Baseline:  Goal status: INITIAL  3.  Pt will verbalize understanding of local  Parkinson's disease resources.  Baseline:  Goal status: INITIAL  4.  Push and release test to be assessed with LTG written for improved balance recovery. Baseline: Anterior = 4-5 small steps Posterior = 5-6 small steps, min A for steadying Lateral = unable to elicit a step in either direction.  Goal status: MET  5.   Pt will improve 5x sit<>stand with BUE support to less than or equal to 22 sec to demonstrate improved functional strength and transfer efficiency.  Baseline: 26.57 seconds with BUE support  Goal status: INITIAL    LONG TERM GOALS: Target date: 04/18/2022  Pt will be independent with final HEP for improved strength, balance, transfers, and gait.  Baseline:  Goal status: INITIAL  2.   Pt will improve gait velocity to at least 2.4 ft/sec with LRAD for improved gait efficiency and safety.   Baseline: 14.96 seconds with SPC = 2.19 ft/sec Goal status: INITIAL  3.   Pt will ambulate at least 500' ft indoor and outdoor surfaces, appropriate assistive device with supervision for imrpoved community gait.   Baseline:  Goal status: INITIAL  4.  Pt will recover posterior and anterior balance in push and release test in 2-3 or less steps independently, for improved balance recovery.   Baseline: Anterior = 4-5 small steps Posterior = 5-6 small steps, min A for steadying Goal status: INITIAL  5.  Pt will improve 5x sit<>stand with BUE support to less than or equal to 22 sec to demonstrate improved functional strength and transfer efficiency. Baseline: 26.57 seconds with BUE support  Goal status: INITIAL  6.  Pt will improve cog TUG to 17 seconds or less in order to demo improved dual tasking/decr fall risk.  Baseline: 22.38 sec with SPC  Goal status: INITIAL    ASSESSMENT:  CLINICAL IMPRESSION: Emphasis of skilled PT session on freezing and stepping strategies, lateral weight shifting and dual tasking. Pt adamant that he does not experience freezing and knows how to  manage despite efforts from multiple therapists to educate pt. Pt demonstrates difficulty w/posterior stepping and dual-tasking but improves w/verbal cues and encouragement. Continue POC.    OBJECTIVE IMPAIRMENTS Abnormal gait, decreased activity tolerance, decreased balance, decreased cognition, decreased coordination, decreased endurance, decreased knowledge of use of DME, difficulty walking, decreased strength, decreased safety awareness, impaired flexibility, impaired sensation, impaired tone, postural dysfunction, and pain.   ACTIVITY LIMITATIONS stairs, transfers, bed mobility, and locomotion level  PARTICIPATION LIMITATIONS: cleaning and community activity  PERSONAL FACTORS Behavior pattern, Past/current experiences, Time since onset of injury/illness/exacerbation, and 3+ comorbidities: neck and back pain, PD -dx in March 2022 per pt report, hx of ETOH abuse, CAD, COPD, emphysema, GERD, HTN, high cholesterol, PVD  are also affecting patient's functional outcome.   REHAB POTENTIAL: Good  CLINICAL DECISION MAKING: Evolving/moderate complexity  EVALUATION COMPLEXITY: Moderate  PLAN: PT FREQUENCY: 2x/week  PT DURATION: 12 weeks  PLANNED INTERVENTIONS: Therapeutic exercises, Therapeutic activity, Neuromuscular re-education, Balance training, Gait training, Patient/Family education, Self Care, Stair training, DME instructions, and Re-evaluation  PLAN FOR NEXT SESSION: Did pt take medication? Pt has treadmill at home and wants to try it in clinic (unsure about this one). Stepping strategies for balance, weight shifting/SLS, continued education on techniques for freezing/festination episodes.    Cruzita Lederer Haydan Mansouri, PT, DPT  03/07/2022, 2:25 PM

## 2022-03-12 ENCOUNTER — Ambulatory Visit: Payer: No Typology Code available for payment source | Admitting: Speech Pathology

## 2022-03-12 ENCOUNTER — Ambulatory Visit: Payer: No Typology Code available for payment source | Admitting: Physical Therapy

## 2022-03-12 VITALS — BP 107/59 | HR 93

## 2022-03-12 DIAGNOSIS — M6281 Muscle weakness (generalized): Secondary | ICD-10-CM | POA: Diagnosis not present

## 2022-03-12 DIAGNOSIS — R2681 Unsteadiness on feet: Secondary | ICD-10-CM | POA: Diagnosis not present

## 2022-03-12 DIAGNOSIS — R251 Tremor, unspecified: Secondary | ICD-10-CM | POA: Diagnosis not present

## 2022-03-12 DIAGNOSIS — R293 Abnormal posture: Secondary | ICD-10-CM | POA: Diagnosis not present

## 2022-03-12 DIAGNOSIS — R2689 Other abnormalities of gait and mobility: Secondary | ICD-10-CM | POA: Diagnosis not present

## 2022-03-12 DIAGNOSIS — R1312 Dysphagia, oropharyngeal phase: Secondary | ICD-10-CM | POA: Diagnosis not present

## 2022-03-12 DIAGNOSIS — R29818 Other symptoms and signs involving the nervous system: Secondary | ICD-10-CM | POA: Diagnosis not present

## 2022-03-12 DIAGNOSIS — R471 Dysarthria and anarthria: Secondary | ICD-10-CM | POA: Diagnosis not present

## 2022-03-12 DIAGNOSIS — R278 Other lack of coordination: Secondary | ICD-10-CM | POA: Diagnosis not present

## 2022-03-12 DIAGNOSIS — R29898 Other symptoms and signs involving the musculoskeletal system: Secondary | ICD-10-CM | POA: Diagnosis not present

## 2022-03-12 NOTE — Therapy (Signed)
OUTPATIENT PHYSICAL THERAPY NEURO TREATMENT   Patient Name: Lee [name] MRN: [id number] DOB:[birth year], 76 y.o., male Today's Date: 03/12/2022   PCP: Cassandria Anger, MD REFERRING PROVIDER: Harvie Heck, MD    PT End of Session - 03/12/22 0856     Visit Number 6    Number of Visits 16    Date for PT Re-Evaluation 05/22/22    Authorization Type VA - 15 visits approved from 12/29/21 - 04/28/22    Authorization - Visit Number 1    Authorization - Number of Visits 15    PT Start Time 1594   Pt arrived late   PT Stop Time 0929    PT Time Calculation (min) 34 min    Equipment Utilized During Treatment --    Activity Tolerance Patient limited by fatigue    Behavior During Therapy [facility] for tasks assessed/performed   easily distracted              Past Medical History:  Diagnosis Date   Acid reflux    Alcohol abuse    quit 10 y ago   Arthritis    BPH (benign prostatic hyperplasia)    CAD (coronary artery disease)    Cervical spondylosis without myelopathy 06/02/2015   COPD (chronic obstructive pulmonary disease) ([place])    Emphysema lung ([place])    GERD (gastroesophageal reflux disease)    High cholesterol    Hypertension    Parkinson's disease ([place])    PVD (peripheral vascular disease) ([place])    Tremor 06/02/2015   Past Surgical History:  Procedure Laterality Date   TONSILLECTOMY     VASECTOMY     Patient Active Problem List   Diagnosis Date Noted   Leukopenia 02/28/2022   Acquired deformities of toe(s), unspecified, unspecified foot 11/27/2021   Asthma 11/27/2021   Cervicalgia 11/27/2021   Corns and callosities 11/27/2021   Disturbance of skin sensation 11/27/2021   Elevated prostate specific antigen (PSA) 11/27/2021   Excessive attrition of teeth 11/27/2021   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   Family history of cancer 11/27/2021   Family history of malignant neoplasm of prostate 11/27/2021   Hyperlipidemia 11/27/2021   Iron deficiency  anemia, unspecified 11/27/2021   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[place]) 11/27/2021   Myalgia 11/27/2021   Nail dystrophy 11/27/2021   Benign neoplasm of other parts of mouth 11/27/2021   Open angle with borderline findings, high risk, left eye 11/27/2021   Other abnormal glucose 11/27/2021   Other reduced mobility 11/27/2021   Other secondary cataract, bilateral 11/27/2021   Pain in right thigh 11/27/2021   Pain in right knee 11/27/2021   Pain in unspecified shoulder 11/27/2021   Persons encountering health services in other specified circumstances 11/27/2021   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/27/2021   Right lower quadrant pain 11/27/2021   Tinnitus 11/27/2021   Trochanteric bursitis, right hip 11/27/2021   Unilateral primary osteoarthritis, right hip 11/27/2021   Unspecified fall, initial encounter 11/27/2021   Vitamin D deficiency, unspecified 11/27/2021   Weakness 11/27/2021   Constipation 08/28/2021   Chronic obstructive pulmonary disease ([place]) 05/31/2021   Depression 04/13/2021   Parkinson's disease ([place]) 02/28/2021   Weakness of both legs 02/01/2021   Dysphagia 02/01/2021   Weakness of right leg 01/06/2021   Lumbar radiculopathy 12/30/2020   Prostate cancer ([place]) 06/09/2020   Well adult exam 06/04/2019   BPH (benign prostatic hyperplasia) 06/04/2019   Bilateral carotid bruits 06/04/2019  Meniere disease, 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) 07/25/2007   GERD  07/25/2007   ERECTILE DYSFUNCTION 07/25/2007   LOW BACK PAIN 07/25/2007    ONSET DATE: 12/29/2021   REFERRING DIAG: G20 (ICD-10-CM) - Parkinson disease ([place])   THERAPY DIAG:  Unsteadiness on feet  Muscle weakness (generalized)  Other abnormalities of gait and mobility  Rationale for Evaluation and Treatment Rehabilitation  SUBJECTIVE:                                                                                                                                                                                              SUBJECTIVE STATEMENT: "My leg is really bothering me today". Pt reports his weekend was alright, "I am grateful to be alive but it is not like I do anything enjoyable". Pt states he "was under the impression that those iron infusions would help me feel better", but still feeling very fatigued.   Pt accompanied by: self  PERTINENT HISTORY: PMH:  neck and back pain, PD -dx in March 2022 per pt report, hx of ETOH abuse, CAD, COPD, emphysema, GERD, HTN, high cholesterol, PVD, pt with hx of being non-compliant with meds  PAIN:  Are you having pain? Yes: NPRS scale: 5/10 Pain location: RLE, distal to knee Pain description: Dull Aggravating factors: Depends how his body is like, pressure Relieving factors: Sitting up taller, tumeric  PRECAUTIONS: Fall  VITALS Vitals:   03/12/22 0901 03/12/22 0917  BP: (!) 108/58 (!) 107/59  Pulse: 90 93     FALLS: Has patient fallen in last 6 months?  No falls to the ground, but stumbles all the time, can't keep count, normally can catch himself.  Had fall on 7/31 in home - first fall in which he was not able to catch himself   PLOF: Independent with community mobility with device  PATIENT GOALS Wants to get rid of the PD and go back to where he was 2-3 years ago. Get back to walking in the park.   OBJECTIVE:   TODAY'S TREATMENT:  Therapeutic Activity Assessed pt's vitals (see above) before and after SciFit and noted no  increase in BP following activity despite pt rating effort as 9/10 on SciFit (see below). Pt  reported high levels of fatigue throughout session, encouraged pt to drink more water at home and provided water throughout session.   Ther Ex  SciFit twin-peaks level 6 for 8 minutes using BUE/BLEs for neural priming for reciprocal movement, dynamic cardiovascular warmup and increased amplitude of stepping. Min cues to slow down cadence as resistance lowered as pt reaching >120 steps/min and over-exerting himself. RPE of 9/10 following activity.   NMR  Sit <>stands w/blue wedge under bilateral toes to bias posterior lean for improved BLE strength, anterior weight shifting and retropulsion correction. Pt required max encouragement to perform and max multimodal cues for proper body mechanics and sequencing. Pt performed x25 reps w/emphasis on eccentric control. Pt frequently chest bumping himself throughout intervention,"Let's go boy". Distant S* throughout for safety.     Gait pattern: step through pattern, decreased arm swing- Left, decreased step length- Right, decreased step length- Left, decreased stride length, festinating, and trunk flexed Distance walked: Various clinic distances  Assistive device utilized: Single point cane Level of assistance: SBA and CGA Comments: No major freezing episodes noted today. Pt took Sinemet ~1 hour prior to session.    PATIENT EDUCATION: Education details: Continue HEP Person educated: Patient Education method: Education officer, environmental, and Handouts Education comprehension: verbalized understanding, returned demonstration, and needs further education   HOME EXERCISE PROGRAM: [email or web address], Seated and Standing PWR moves   Access Code: [email or web address] URL: [email or web address] Date: 03/05/2022 Prepared by: Mickie Bail Omarius Grantham  Exercises - Alternating Step Backward with Support  - 1 x daily - 5 x weekly - 1 sets - 10 reps - Alternating Step Forward with  Support  - 1 x daily - 5 x weekly - 1 sets - 10 reps - Step Sideways  - 1 x daily - 5 x weekly - 1 sets - 10 reps - Standing Quarter Turn with Counter Support  - 1 x daily - 5 x weekly - 1 sets - 5 reps - Sit to Stand Without Arm Support  - 1 x daily - 7 x weekly - 3 sets - 10 reps    GOALS: Goals reviewed with patient? Yes  SHORT TERM GOALS: Target date: 03/21/2022  Pt will be independent with initial HEP for improved strength, balance, transfers, and gait.  Baseline: Goal status: INITIAL  2.  Pt will verbalize tips to reduce festination/freezing with gait and turns.  Baseline:  Goal status: INITIAL  3.  Pt will verbalize understanding of local Parkinson's disease resources.  Baseline:  Goal status: INITIAL  4.  Push and release test to be assessed with LTG written for improved balance recovery. Baseline: Anterior = 4-5 small steps Posterior = 5-6 small steps, min A for steadying Lateral = unable to elicit a step in either direction.  Goal status: MET  5.   Pt will improve 5x sit<>stand with BUE support to less than or equal to 22 sec to demonstrate improved functional strength and transfer efficiency.  Baseline: 26.57 seconds with BUE support  Goal status: INITIAL    LONG TERM GOALS: Target date: 04/18/2022  Pt will be independent with final HEP for improved strength, balance, transfers, and gait.  Baseline:  Goal status: INITIAL  2.   Pt will improve gait velocity to at least 2.4 ft/sec with LRAD for improved gait efficiency and safety.   Baseline: 14.96 seconds with SPC = 2.19 ft/sec Goal status: INITIAL  3.   Pt will ambulate at least 500' ft indoor and outdoor surfaces, appropriate assistive device with supervision for imrpoved community  gait.   Baseline:  Goal status: INITIAL  4.  Pt will recover posterior and anterior balance in push and release test in 2-3 or less steps independently, for improved balance recovery.   Baseline: Anterior = 4-5 small  steps Posterior = 5-6 small steps, min A for steadying Goal status: INITIAL  5.  Pt will improve 5x sit<>stand with BUE support to less than or equal to 22 sec to demonstrate improved functional strength and transfer efficiency. Baseline: 26.57 seconds with BUE support  Goal status: INITIAL  6.  Pt will improve cog TUG to 17 seconds or less in order to demo improved dual tasking/decr fall risk.  Baseline: 22.38 sec with SPC  Goal status: INITIAL    ASSESSMENT:  CLINICAL IMPRESSION: Session limited due to pt arriving late. Emphasis of skilled PT session on endurance and retropulsion correction w/transfers. Pt very fatigued today and demonstrated hypotension throughout session even following cardio. Pt continues to be limited by freezing episodes, noncompliance w/HEP and disregard for education provided by therapist. Continue POC.    OBJECTIVE IMPAIRMENTS Abnormal gait, decreased activity tolerance, decreased balance, decreased cognition, decreased coordination, decreased endurance, decreased knowledge of use of DME, difficulty walking, decreased strength, decreased safety awareness, impaired flexibility, impaired sensation, impaired tone, postural dysfunction, and pain.   ACTIVITY LIMITATIONS stairs, transfers, bed mobility, and locomotion level  PARTICIPATION LIMITATIONS: cleaning and community activity  PERSONAL FACTORS Behavior pattern, Past/current experiences, Time since onset of injury/illness/exacerbation, and 3+ comorbidities: neck and back pain, PD -dx in March 2022 per pt report, hx of ETOH abuse, CAD, COPD, emphysema, GERD, HTN, high cholesterol, PVD  are also affecting patient's functional outcome.   REHAB POTENTIAL: Good  CLINICAL DECISION MAKING: Evolving/moderate complexity  EVALUATION COMPLEXITY: Moderate  PLAN: PT FREQUENCY: 2x/week  PT DURATION: 12 weeks  PLANNED INTERVENTIONS: Therapeutic exercises, Therapeutic activity, Neuromuscular re-education, Balance  training, Gait training, Patient/Family education, Self Care, Stair training, DME instructions, and Re-evaluation  PLAN FOR NEXT SESSION: Did pt take medication? Pt has treadmill at home and wants to try it in clinic (unsure about this one). Stepping strategies for balance, weight shifting/SLS, continued education on techniques for freezing/festination episodes. Cone taps on unlevel surfaces, rebounder drills    Jadzia Ibsen E Jenyfer Trawick, PT, DPT  03/12/2022, 9:30 AM

## 2022-03-14 ENCOUNTER — Ambulatory Visit: Payer: No Typology Code available for payment source | Admitting: Physical Therapy

## 2022-03-14 ENCOUNTER — Encounter: Payer: No Typology Code available for payment source | Admitting: Speech Pathology

## 2022-03-14 VITALS — BP 109/58 | HR 86 | Wt 148.0 lb

## 2022-03-14 DIAGNOSIS — R2681 Unsteadiness on feet: Secondary | ICD-10-CM

## 2022-03-14 DIAGNOSIS — R29898 Other symptoms and signs involving the musculoskeletal system: Secondary | ICD-10-CM | POA: Diagnosis not present

## 2022-03-14 DIAGNOSIS — M6281 Muscle weakness (generalized): Secondary | ICD-10-CM | POA: Diagnosis not present

## 2022-03-14 DIAGNOSIS — R471 Dysarthria and anarthria: Secondary | ICD-10-CM | POA: Diagnosis not present

## 2022-03-14 DIAGNOSIS — R2689 Other abnormalities of gait and mobility: Secondary | ICD-10-CM

## 2022-03-14 DIAGNOSIS — R251 Tremor, unspecified: Secondary | ICD-10-CM | POA: Diagnosis not present

## 2022-03-14 DIAGNOSIS — R1312 Dysphagia, oropharyngeal phase: Secondary | ICD-10-CM | POA: Diagnosis not present

## 2022-03-14 DIAGNOSIS — R278 Other lack of coordination: Secondary | ICD-10-CM | POA: Diagnosis not present

## 2022-03-14 DIAGNOSIS — R29818 Other symptoms and signs involving the nervous system: Secondary | ICD-10-CM | POA: Diagnosis not present

## 2022-03-14 DIAGNOSIS — R293 Abnormal posture: Secondary | ICD-10-CM | POA: Diagnosis not present

## 2022-03-14 NOTE — Therapy (Signed)
OUTPATIENT PHYSICAL THERAPY NEURO TREATMENT   Patient Name: Lee [name] MRN: [id number] DOB:1946/04/19, 76 y.o., male Today's Date: 03/14/2022   PCP: Cassandria Anger, MD REFERRING PROVIDER: Harvie Heck, MD    PT End of Session - 03/14/22 0855     Visit Number 7    Number of Visits 16    Date for PT Re-Evaluation 05/22/22    Authorization Type VA - 15 visits approved from 12/29/21 - 04/28/22    Authorization - Visit Number 1    Authorization - Number of Visits 15    PT Start Time 4854   Pt arrived late   PT Stop Time 0928    PT Time Calculation (min) 33 min    Activity Tolerance Patient limited by lethargy;Treatment limited secondary to medical complications (Comment)   Hypotension   Behavior During Therapy Flat affect               Past Medical History:  Diagnosis Date   Acid reflux    Alcohol abuse    quit 10 y ago   Arthritis    BPH (benign prostatic hyperplasia)    CAD (coronary artery disease)    Cervical spondylosis without myelopathy 06/02/2015   COPD (chronic obstructive pulmonary disease) ([place])    Emphysema lung ([place])    GERD (gastroesophageal reflux disease)    High cholesterol    Hypertension    Parkinson's disease ([place])    PVD (peripheral vascular disease) ([place])    Tremor 06/02/2015   Past Surgical History:  Procedure Laterality Date   TONSILLECTOMY     VASECTOMY     Patient Active Problem List   Diagnosis Date Noted   Leukopenia 02/28/2022   Acquired deformities of toe(s), unspecified, unspecified foot 11/27/2021   Asthma 11/27/2021   Cervicalgia 11/27/2021   Corns and callosities 11/27/2021   Disturbance of skin sensation 11/27/2021   Elevated prostate specific antigen (PSA) 11/27/2021   Excessive attrition of teeth 11/27/2021   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   Family history of cancer 11/27/2021   Family history of malignant neoplasm of prostate 11/27/2021   Hyperlipidemia 11/27/2021   Iron deficiency anemia,  unspecified 11/27/2021   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[place]) 11/27/2021   Myalgia 11/27/2021   Nail dystrophy 11/27/2021   Benign neoplasm of other parts of mouth 11/27/2021   Open angle with borderline findings, high risk, left eye 11/27/2021   Other abnormal glucose 11/27/2021   Other reduced mobility 11/27/2021   Other secondary cataract, bilateral 11/27/2021   Pain in right thigh 11/27/2021   Pain in right knee 11/27/2021   Pain in unspecified shoulder 11/27/2021   Persons encountering health services in other specified circumstances 11/27/2021   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/27/2021   Right lower quadrant pain 11/27/2021   Tinnitus 11/27/2021   Trochanteric bursitis, right hip 11/27/2021   Unilateral primary osteoarthritis, right hip 11/27/2021   Unspecified fall, initial encounter 11/27/2021   Vitamin D deficiency, unspecified 11/27/2021   Weakness 11/27/2021   Constipation 08/28/2021   Chronic obstructive pulmonary disease ([place]) 05/31/2021   Depression 04/13/2021   Parkinson's disease ([place]) 02/28/2021   Weakness of both legs 02/01/2021   Dysphagia 02/01/2021   Weakness of right leg 01/06/2021   Lumbar radiculopathy 12/30/2020   Prostate cancer ([place]) 06/09/2020   Well adult exam 06/04/2019   BPH (benign prostatic hyperplasia) 06/04/2019   Bilateral carotid bruits 06/04/2019   Meniere disease, 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  Emphysema of lung ([place]) 06/04/2019   Spondylolisthesis at L4-L5 level 08/27/2017   Spinal stenosis in cervical region 08/27/2017   Tremor 06/02/2015   Cervical spondylosis without myelopathy 06/02/2015   Alcoholism in remission ([place]) 06/09/2014   Coronary arteriosclerosis 07/30/2009   History of colonoscopy 07/30/2009   Neutropenia ([place]) 07/30/2008   HIP PAIN 11/27/2007   Essential hypertension 07/25/2007   Atrial fibrillation ([place]) 07/25/2007   GERD 07/25/2007    ERECTILE DYSFUNCTION 07/25/2007   LOW BACK PAIN 07/25/2007    ONSET DATE: 12/29/2021   REFERRING DIAG: G20 (ICD-10-CM) - Parkinson disease ([place])   THERAPY DIAG:  Unsteadiness on feet  Muscle weakness (generalized)  Other abnormalities of gait and mobility  Rationale for Evaluation and Treatment Rehabilitation  SUBJECTIVE:                                                                                                                                                                                              SUBJECTIVE STATEMENT: Pt reports he is "about the same". Has had no appetite and has been making himself eat. Not sleeping well, not doing his exercises due to fatigue. No new falls but plenty of stumbles   Pt accompanied by: self  PERTINENT HISTORY: PMH:  neck and back pain, PD -dx in March 2022 per pt report, hx of ETOH abuse, CAD, COPD, emphysema, GERD, HTN, high cholesterol, PVD, pt with hx of being non-compliant with meds  PAIN:  Are you having pain? Yes: NPRS scale: 3/10 Pain location: RLE, distal to knee Pain description: Dull Aggravating factors: Depends how his body is like, pressure Relieving factors: Sitting up taller, tumeric  PRECAUTIONS: Fall  VITALS Vitals:   03/14/22 0859 03/14/22 0926  BP: (!) 109/52 (!) 109/58  Pulse: 88 86      FALLS: Has patient fallen in last 6 months?  No falls to the ground, but stumbles all the time, can't keep count, normally can catch himself.  Had fall on 7/31 in home - first fall in which he was not able to catch himself   PLOF: Independent with community mobility with device  PATIENT GOALS Wants to get rid of the PD and go back to where he was 2-3 years ago. Get back to walking in the park.   OBJECTIVE:   TODAY'S TREATMENT:  Self-care/home management  Lengthy discussion regarding importance of proper hydration/nutrition for global health and performance in PT. Assessed pt's vitals (see above) and pt hypotensive  and has lost 3lbs in 2 weeks due to only eating fruit for meals. Encouraged pt to consider referral for counseling and dietician to assist  w/proper nutrition and mental health, but pt declining at this time. Informed pt that gains in PT are limited if he is not able to perform in clinic and is not performing HEP, pt replied that he would start eating more. Will continue to monitor pt's vitals in clinic.   Ther Ex  SciFit multi-peaks level 8 for 10 minutes using BUE/BLEs for neural priming for reciprocal movement, dynamic cardiovascular conditioning and increased amplitude of stepping. RPE of 9/10 following activity. Assessed vitals following activity w/no change in BP from resting value    Gait pattern: step through pattern, decreased arm swing- Left, decreased step length- Right, decreased step length- Left, decreased stride length, festinating, and trunk flexed Distance walked: Various clinic distances  Assistive device utilized: Single point cane and None Level of assistance: CGA and Min A Comments: Noted significant festination today requiring min A when pt did not use cane. Significant difficulty getting onto/off of scale due to freezing and impulsivity.    PATIENT EDUCATION: Education details: See self-care section  Person educated: Patient Education method: Explanation, Demonstration, and Handouts Education comprehension: verbalized understanding, returned demonstration, and needs further education   HOME EXERCISE PROGRAM: [email or web address], Seated and Standing PWR moves   Access Code: [email or web address] URL: [email or web address] Date: 03/05/2022 Prepared by: Mickie Bail Jeyren Danowski  Exercises - Alternating Step Backward with Support  - 1 x daily - 5 x weekly - 1 sets - 10 reps - Alternating Step Forward with Support  - 1 x daily - 5 x weekly - 1 sets - 10 reps - Step Sideways  - 1 x daily - 5 x weekly - 1 sets - 10 reps - Standing Quarter Turn with Counter Support  - 1 x daily - 5 x weekly  - 1 sets - 5 reps - Sit to Stand Without Arm Support  - 1 x daily - 7 x weekly - 3 sets - 10 reps    GOALS: Goals reviewed with patient? Yes  SHORT TERM GOALS: Target date: 03/21/2022  Pt will be independent with initial HEP for improved strength, balance, transfers, and gait.  Baseline: Goal status: INITIAL  2.  Pt will verbalize tips to reduce festination/freezing with gait and turns.  Baseline:  Goal status: INITIAL  3.  Pt will verbalize understanding of local Parkinson's disease resources.  Baseline:  Goal status: INITIAL  4.  Push and release test to be assessed with LTG written for improved balance recovery. Baseline: Anterior = 4-5 small steps Posterior = 5-6 small steps, min A for steadying Lateral = unable to elicit a step in either direction.  Goal status: MET  5.   Pt will improve 5x sit<>stand with BUE support to less than or equal to 22 sec to demonstrate improved functional strength and transfer efficiency.  Baseline: 26.57 seconds with BUE support  Goal status: INITIAL    LONG TERM GOALS: Target date: 04/18/2022  Pt will be independent with final HEP for improved strength, balance, transfers, and gait.  Baseline:  Goal status: INITIAL  2.   Pt will improve gait velocity to at least 2.4 ft/sec with LRAD for improved gait efficiency and safety.   Baseline: 14.96 seconds with SPC = 2.19 ft/sec Goal status: INITIAL  3.   Pt will ambulate at least 500' ft indoor and outdoor surfaces, appropriate assistive device with supervision for imrpoved community gait.   Baseline:  Goal status: INITIAL  4.  Pt will recover posterior and anterior balance in push and release test in 2-3 or  less steps independently, for improved balance recovery.   Baseline: Anterior = 4-5 small steps Posterior = 5-6 small steps, min A for steadying Goal status: INITIAL  5.  Pt will improve 5x sit<>stand with BUE support to less than or equal to 22 sec to demonstrate improved  functional strength and transfer efficiency. Baseline: 26.57 seconds with BUE support  Goal status: INITIAL  6.  Pt will improve cog TUG to 17 seconds or less in order to demo improved dual tasking/decr fall risk.  Baseline: 22.38 sec with SPC  Goal status: INITIAL    ASSESSMENT:  CLINICAL IMPRESSION: Session limited due to pt arriving late and pt's low BP. Emphasis of skilled PT session on pt education regarding proper nutrition and resources available to pt. Pt demonstrating poor food and water intake and has had low BP readings and significant weight loss, resulting in pt being very lethargic during sessions. Will continue to monitor vitals during sessions and will contact referring provider if necessary. Continue POC.    OBJECTIVE IMPAIRMENTS Abnormal gait, decreased activity tolerance, decreased balance, decreased cognition, decreased coordination, decreased endurance, decreased knowledge of use of DME, difficulty walking, decreased strength, decreased safety awareness, impaired flexibility, impaired sensation, impaired tone, postural dysfunction, and pain.   ACTIVITY LIMITATIONS stairs, transfers, bed mobility, and locomotion level  PARTICIPATION LIMITATIONS: cleaning and community activity  PERSONAL FACTORS Behavior pattern, Past/current experiences, Time since onset of injury/illness/exacerbation, and 3+ comorbidities: neck and back pain, PD -dx in March 2022 per pt report, hx of ETOH abuse, CAD, COPD, emphysema, GERD, HTN, high cholesterol, PVD  are also affecting patient's functional outcome.   REHAB POTENTIAL: Good  CLINICAL DECISION MAKING: Evolving/moderate complexity  EVALUATION COMPLEXITY: Moderate  PLAN: PT FREQUENCY: 2x/week  PT DURATION: 12 weeks  PLANNED INTERVENTIONS: Therapeutic exercises, Therapeutic activity, Neuromuscular re-education, Balance training, Gait training, Patient/Family education, Self Care, Stair training, DME instructions, and  Re-evaluation  PLAN FOR NEXT SESSION: Start assessing goals. Did pt take medication? Pt has treadmill at home and wants to try it in clinic (unsure about this one). Stepping strategies for balance, weight shifting/SLS, continued education on techniques for freezing/festination episodes. Cone taps on unlevel surfaces, rebounder drills    Win Guajardo E Javyon Fontan, PT, DPT  03/14/2022, 9:29 AM

## 2022-03-18 NOTE — Therapy (Signed)
OUTPATIENT OCCUPATIONAL THERAPY PARKINSON'S EVALUATION  Patient Name: Lee [name] MRN: [id number] DOB:March 12, 1946, 76 y.o., male Today's Date: 03/19/2022  PCP: Dr. Lew Dawes REFERRING PROVIDER: Dr. Landry Mellow   OT End of Session - 03/19/22 1237     Visit Number 2    Number of Visits 25    Date for OT Re-Evaluation 05/17/22    Authorization Type VA    Authorization Time Period through 06/01/22- most likely approved for 15 visits still need to check    Authorization - Visit Number 2    Authorization - Number of Visits 15    Progress Note Due on Visit 10    OT Start Time 1236    OT Stop Time 1317    OT Time Calculation (min) 41 min    Activity Tolerance Patient tolerated treatment well    Behavior During Therapy Flat affect               Past Medical History:  Diagnosis Date   Acid reflux    Alcohol abuse    quit 10 y ago   Arthritis    BPH (benign prostatic hyperplasia)    CAD (coronary artery disease)    Cervical spondylosis without myelopathy 06/02/2015   COPD (chronic obstructive pulmonary disease) (HCC)    Emphysema lung (HCC)    GERD (gastroesophageal reflux disease)    High cholesterol    Hypertension    Parkinson's disease ([place])    PVD (peripheral vascular disease) ([place])    Tremor 06/02/2015   Past Surgical History:  Procedure Laterality Date   TONSILLECTOMY     VASECTOMY     Patient Active Problem List   Diagnosis Date Noted   Leukopenia 02/28/2022   Acquired deformities of toe(s), unspecified, unspecified foot 11/27/2021   Asthma 11/27/2021   Cervicalgia 11/27/2021   Corns and callosities 11/27/2021   Disturbance of skin sensation 11/27/2021   Elevated prostate specific antigen (PSA) 11/27/2021   Excessive attrition of teeth 11/27/2021   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   Family history of cancer 11/27/2021   Family history of malignant neoplasm of prostate 11/27/2021   Hyperlipidemia 11/27/2021   Iron deficiency  anemia, unspecified 11/27/2021   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[place]) 11/27/2021   Myalgia 11/27/2021   Nail dystrophy 11/27/2021   Benign neoplasm of other parts of mouth 11/27/2021   Open angle with borderline findings, high risk, left eye 11/27/2021   Other abnormal glucose 11/27/2021   Other reduced mobility 11/27/2021   Other secondary cataract, bilateral 11/27/2021   Pain in right thigh 11/27/2021   Pain in right knee 11/27/2021   Pain in unspecified shoulder 11/27/2021   Persons encountering health services in other specified circumstances 11/27/2021   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/27/2021   Right lower quadrant pain 11/27/2021   Tinnitus 11/27/2021   Trochanteric bursitis, right hip 11/27/2021   Unilateral primary osteoarthritis, right hip 11/27/2021   Unspecified fall, initial encounter 11/27/2021   Vitamin D deficiency, unspecified 11/27/2021   Weakness 11/27/2021   Constipation 08/28/2021   Chronic obstructive pulmonary disease ([place]) 05/31/2021   Depression 04/13/2021   Parkinson's disease (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/2019   Emphysema of lung ([place]) 06/04/2019   Spondylolisthesis at L4-L5 level 08/27/2017   Spinal stenosis in cervical region 08/27/2017   Tremor 06/02/2015   Cervical spondylosis without myelopathy 06/02/2015   Alcoholism in remission ([place]) 06/09/2014   Coronary arteriosclerosis 07/30/2009   History of colonoscopy 07/30/2009   Neutropenia ([place]) 07/30/2008   HIP PAIN 11/27/2007   Essential hypertension 07/25/2007   Atrial fibrillation ([place]) 07/25/2007   GERD  07/25/2007   ERECTILE DYSFUNCTION 07/25/2007   LOW BACK PAIN 07/25/2007    ONSET DATE: 02/06/22-referral date  REFERRING DIAG: Parkinson's disease; Z74.09,Z78.9 (ICD-10-CM) - Impaired mobility and ADLs   THERAPY DIAG:  Other lack of coordination  Unsteadiness on feet  Muscle weakness (generalized)  Other abnormalities of gait and mobility  Attention and concentration deficit  Frontal lobe and executive function deficit  Tremor  Other symptoms and signs involving the musculoskeletal system  Other symptoms and signs involving the nervous system  Abnormal posture  SUBJECTIVE:   SUBJECTIVE STATEMENT: "It's much easier with my left hand"   Pt accompanied by: self  PERTINENT HISTORY:   Parkinson's disease--dx in March 2022 per pt report.    Other PMH:  neck and back pain, arthritis, hx of ETOH abuse, CAD, COPD, emphysema, GERD, HTN, high cholesterol, PVD, chronic vertigo per pt   PRECAUTIONS: Fall  WEIGHT BEARING RESTRICTIONS No  PAIN:  Are you having pain? No--nothing more than ususal  FALLS: Has patient fallen in last 6 months? multiple "lost count"   pt reports that he falls daily, but can usually catch himself--bumps and bruises  LIVING ENVIRONMENT: Lives with: lives alone.   Lives in: House/apartment Stairs: Yes: Internal: ) steps; none and External: 3 steps; can reach both Has following equipment at home: Single point cane  PLOF: Independent  PATIENT GOALS move better, stay independent   HAND DOMINANCE: Right  OBJECTIVE:   TODAY'S TREATMENT:   Assessed Standing functional reach test:  R-8", L-9"   See Pt Education below  PATIENT EDUCATION: Education details: PWR! Hands (basic 4) and coordination HEP with focus on large amplitude movements   Person educated: Patient Education method: Explanation, Demonstration, Tactile cues, Verbal cues, and Handouts Education comprehension: verbalized understanding, returned demonstration, verbal cues  required, tactile cues required, and needs further education   HOME EXERCISE PROGRAM: 03/19/22:  PWR! Hands (basic 4) and coordination HEP with focus on large amplitude movements    ASSESSMENT:  CLINICAL IMPRESSION: Pt responded well to cueing for large amplitude movements today and improved with repetition.   PERFORMANCE DEFICITS in functional skills including ADLs, IADLs, coordination, dexterity, tone, ROM, FMC, GMC, mobility, balance, decreased knowledge of precautions, decreased knowledge of use of DME, and UE functional use, cognitive skills including attention and memory, and psychosocial skills including habits and routines and behaviors.   IMPAIRMENTS are limiting patient from ADLs and IADLs.   COMORBIDITIES has co-morbidities such as CAD, COPD, HTN, hx of neck and back pain  that affects occupational performance. Patient will benefit from skilled OT to address above impairments and improve overall function.  MODIFICATION OR ASSISTANCE TO COMPLETE EVALUATION: Min-Moderate modification of tasks or assist with assess necessary to complete an evaluation.  OT OCCUPATIONAL PROFILE AND HISTORY: Detailed assessment: Review of records and additional review of physical, cognitive, psychosocial history related to current functional performance.  CLINICAL DECISION MAKING: Moderate - several treatment options, min-mod task modification necessary  REHAB POTENTIAL: Good  EVALUATION COMPLEXITY: Moderate    GOALS: Potential Goals reviewed with patient?  Yes  SHORT TERM GOALS: Target date: 01/04/2022  Pt will be independent with PD-specific HEP.  Goal status: INITIAL  2.  Pt will verbalize understanding of ways to decr risk of future complications and appropriate community resources. Goal status: INITIAL  3.  Baseline:Pt will report incr ease with grooming tasks and ability to use only RUE.  Goal status: INITIAL  4.  Pt will demonstrate improved ease with fastening buttons as evidenced  by decreasing 3 button/ unbutton time to:33 secs or less    Baseline: 35.41 Goal status: INITIAL  5 Pt will demonstrate improved LUE functional use as evidenced by increasing box/ blocks score by 3 blocks.  Baseline: R 53, L 49  LONG TERM GOALS: Target date: 05/17/2022  Pt will verbalize understanding of adaptive strategies/AE to incr ease and safety with ADLs/IADLs.(Cutting food, opening containers) Goal status: INITIAL  2.  Pt will demonstrate increased ease with dressing as eveidenced by decreasing PPT#4(don/ doff jacket) to 13 secs or less Baseline: 15.88sec Goal status: INITIAL  3.  Pt will be able to write at least 3 sentences with good legibility and only  no significant decr. In letter size Goal status: INITIAL  4.  Pt will improve balance/functional reach as shown by reaching at least 10" with each UE on standing functional reach test.  Baseline: R-8", L-9" Goal status: INITIAL   PLAN: OT FREQUENCY: 2x/week  OT DURATION: 12 weeks, may modify due to scheduling/progress  PLANNED INTERVENTIONS: self care/ADL training, therapeutic exercise, therapeutic activity, neuromuscular re-education, manual therapy, passive range of motion, balance training, functional mobility training, aquatic therapy, ultrasound, paraffin, fluidotherapy, moist heat, cryotherapy, patient/family education, cognitive remediation/compensation, visual/perceptual remediation/compensation, energy conservation, coping strategies training, and DME and/or AE instructions  RECOMMENDED OTHER SERVICES: scheduled for evals with PT and ST  CONSULTED AND AGREED WITH PLAN OF CARE: Patient  PLAN FOR NEXT SESSION: PWR! Moves in supine and ?quadruped, functional reaching with large amplitude movements.   Vianne Bulls, OTR/L 03/19/2022, 1:24 PM  [facility] [address] [place] [address] [place], [place]  [address] [phone] phone [phone] 03/19/22 1:24 PM

## 2022-03-19 ENCOUNTER — Ambulatory Visit: Payer: No Typology Code available for payment source | Admitting: Occupational Therapy

## 2022-03-19 ENCOUNTER — Ambulatory Visit: Payer: No Typology Code available for payment source | Admitting: Physical Therapy

## 2022-03-19 ENCOUNTER — Encounter: Payer: Self-pay | Admitting: Occupational Therapy

## 2022-03-19 DIAGNOSIS — M6281 Muscle weakness (generalized): Secondary | ICD-10-CM

## 2022-03-19 DIAGNOSIS — R2681 Unsteadiness on feet: Secondary | ICD-10-CM | POA: Diagnosis not present

## 2022-03-19 DIAGNOSIS — R41840 Attention and concentration deficit: Secondary | ICD-10-CM

## 2022-03-19 DIAGNOSIS — R29898 Other symptoms and signs involving the musculoskeletal system: Secondary | ICD-10-CM | POA: Diagnosis not present

## 2022-03-19 DIAGNOSIS — R41844 Frontal lobe and executive function deficit: Secondary | ICD-10-CM

## 2022-03-19 DIAGNOSIS — R251 Tremor, unspecified: Secondary | ICD-10-CM | POA: Diagnosis not present

## 2022-03-19 DIAGNOSIS — R278 Other lack of coordination: Secondary | ICD-10-CM | POA: Diagnosis not present

## 2022-03-19 DIAGNOSIS — R29818 Other symptoms and signs involving the nervous system: Secondary | ICD-10-CM | POA: Diagnosis not present

## 2022-03-19 DIAGNOSIS — R2689 Other abnormalities of gait and mobility: Secondary | ICD-10-CM

## 2022-03-19 DIAGNOSIS — R1312 Dysphagia, oropharyngeal phase: Secondary | ICD-10-CM | POA: Diagnosis not present

## 2022-03-19 DIAGNOSIS — R293 Abnormal posture: Secondary | ICD-10-CM | POA: Diagnosis not present

## 2022-03-19 DIAGNOSIS — R471 Dysarthria and anarthria: Secondary | ICD-10-CM | POA: Diagnosis not present

## 2022-03-19 NOTE — Therapy (Signed)
OUTPATIENT PHYSICAL THERAPY NEURO TREATMENT- ARRIVED NO CHARGE   Patient Name: Lee [name] MRN: [id number] DOB:05-13-1946, 76 y.o., male Today's Date: 03/19/2022   PCP: Cassandria Anger, MD REFERRING PROVIDER: Harvie Heck, MD    PT End of Session - 03/19/22 1324     Visit Number 7    Number of Visits 16    Date for PT Re-Evaluation 05/22/22    Authorization Type VA - 15 visits approved from 12/29/21 - 04/28/22    Authorization - Visit Number 1    Authorization - Number of Visits 15    PT Start Time 46   Handoff w/OT   PT Stop Time 56   Pt requesting to leave early due to fatigue   PT Time Calculation (min) 5 min    Activity Tolerance Patient limited by lethargy    Behavior During Therapy Flat affect               Past Medical History:  Diagnosis Date   Acid reflux    Alcohol abuse    quit 10 y ago   Arthritis    BPH (benign prostatic hyperplasia)    CAD (coronary artery disease)    Cervical spondylosis without myelopathy 06/02/2015   COPD (chronic obstructive pulmonary disease) ([place])    Emphysema lung ([place])    GERD (gastroesophageal reflux disease)    High cholesterol    Hypertension    Parkinson's disease ([place])    PVD (peripheral vascular disease) ([place])    Tremor 06/02/2015   Past Surgical History:  Procedure Laterality Date   TONSILLECTOMY     VASECTOMY     Patient Active Problem List   Diagnosis Date Noted   Leukopenia 02/28/2022   Acquired deformities of toe(s), unspecified, unspecified foot 11/27/2021   Asthma 11/27/2021   Cervicalgia 11/27/2021   Corns and callosities 11/27/2021   Disturbance of skin sensation 11/27/2021   Elevated prostate specific antigen (PSA) 11/27/2021   Excessive attrition of teeth 11/27/2021   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   Family history of cancer 11/27/2021   Family history of malignant neoplasm of prostate 11/27/2021   Hyperlipidemia 11/27/2021   Iron deficiency anemia, unspecified  11/27/2021   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[place]) 11/27/2021   Myalgia 11/27/2021   Nail dystrophy 11/27/2021   Benign neoplasm of other parts of mouth 11/27/2021   Open angle with borderline findings, high risk, left eye 11/27/2021   Other abnormal glucose 11/27/2021   Other reduced mobility 11/27/2021   Other secondary cataract, bilateral 11/27/2021   Pain in right thigh 11/27/2021   Pain in right knee 11/27/2021   Pain in unspecified shoulder 11/27/2021   Persons encountering health services in other specified circumstances 11/27/2021   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/27/2021   Right lower quadrant pain 11/27/2021   Tinnitus 11/27/2021   Trochanteric bursitis, right hip 11/27/2021   Unilateral primary osteoarthritis, right hip 11/27/2021   Unspecified fall, initial encounter 11/27/2021   Vitamin D deficiency, unspecified 11/27/2021   Weakness 11/27/2021   Constipation 08/28/2021   Chronic obstructive pulmonary disease ([place]) 05/31/2021   Depression 04/13/2021   Parkinson's disease ([place]) 02/28/2021   Weakness of both legs 02/01/2021   Dysphagia 02/01/2021   Weakness of right leg 01/06/2021   Lumbar radiculopathy 12/30/2020   Prostate cancer ([place]) 06/09/2020   Well adult exam 06/04/2019   BPH (benign prostatic hyperplasia) 06/04/2019   Bilateral carotid bruits 06/04/2019   Meniere  disease, 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   Emphysema of lung ([place]) 06/04/2019   Spondylolisthesis at L4-L5 level 08/27/2017   Spinal stenosis in cervical region 08/27/2017   Tremor 06/02/2015   Cervical spondylosis without myelopathy 06/02/2015   Alcoholism in remission ([place]) 06/09/2014   Coronary arteriosclerosis 07/30/2009   History of colonoscopy 07/30/2009   Neutropenia ([place]) 07/30/2008   HIP PAIN 11/27/2007   Essential hypertension 07/25/2007   Atrial fibrillation ([place]) 07/25/2007   GERD 07/25/2007   ERECTILE  DYSFUNCTION 07/25/2007   LOW BACK PAIN 07/25/2007    ONSET DATE: 12/29/2021   REFERRING DIAG: G20 (ICD-10-CM) - Parkinson disease ([place])   THERAPY DIAG:  Muscle weakness (generalized)  Rationale for Evaluation and Treatment Rehabilitation  SUBJECTIVE:                                                                                                                                                                                              SUBJECTIVE STATEMENT: Pt reports he is "exhausted". Pt's speech very hypophonic and difficult to understand. Pt reporting he went to the [place] this morning and did not sleep well, requesting to leave PT and go home to rest. Pt's request honored.   Pt accompanied by: self  PERTINENT HISTORY: PMH:  neck and back pain, PD -dx in March 2022 per pt report, hx of ETOH abuse, CAD, COPD, emphysema, GERD, HTN, high cholesterol, PVD, pt with hx of being non-compliant with meds  PAIN:  Are you having pain? Yes: NPRS scale: 3/10 Pain location: RLE, distal to knee Pain description: Dull Aggravating factors: Depends how his body is like, pressure Relieving factors: Sitting up taller, tumeric  PRECAUTIONS: Fall  VITALS There were no vitals filed for this visit.     FALLS: Has patient fallen in last 6 months?  No falls to the ground, but stumbles all the time, can't keep count, normally can catch himself.  Had fall on 7/31 in home - first fall in which he was not able to catch himself   PLOF: Independent with community mobility with device  PATIENT GOALS Wants to get rid of the PD and go back to where he was 2-3 years ago. [place].   OBJECTIVE:   TODAY'S TREATMENT:  Arrived no charge due to fatigue. Escorted pt out of clinic due to increased festination and lateral instability.    Gait pattern: step through pattern, decreased arm swing- Left, decreased step length- Right, decreased step length- Left, decreased stride length,  festinating, and trunk flexed Distance walked: Various clinic distances  Assistive device utilized: Single point cane and None Level of  assistance: Min A Comments: Noted increased festination today and fighting through freezing episodes despite cues from therapist.    PATIENT EDUCATION: Education details: Continue HEP Person educated: Patient Education method: Explanation, Demonstration, and Handouts Education comprehension: verbalized understanding, returned demonstration, and needs further education   HOME EXERCISE PROGRAM: [email or web address], Seated and Standing PWR moves   Access Code: [email or web address] URL: [email or web address] Date: 03/05/2022 Prepared by: Mickie Bail Ambert Virrueta  Exercises - Alternating Step Backward with Support  - 1 x daily - 5 x weekly - 1 sets - 10 reps - Alternating Step Forward with Support  - 1 x daily - 5 x weekly - 1 sets - 10 reps - Step Sideways  - 1 x daily - 5 x weekly - 1 sets - 10 reps - Standing Quarter Turn with Counter Support  - 1 x daily - 5 x weekly - 1 sets - 5 reps - Sit to Stand Without Arm Support  - 1 x daily - 7 x weekly - 3 sets - 10 reps    GOALS: Goals reviewed with patient? Yes  SHORT TERM GOALS: Target date: 03/21/2022  Pt will be independent with initial HEP for improved strength, balance, transfers, and gait.  Baseline: Goal status: INITIAL  2.  Pt will verbalize tips to reduce festination/freezing with gait and turns.  Baseline:  Goal status: INITIAL  3.  Pt will verbalize understanding of local Parkinson's disease resources.  Baseline:  Goal status: INITIAL  4.  Push and release test to be assessed with LTG written for improved balance recovery. Baseline: Anterior = 4-5 small steps Posterior = 5-6 small steps, min A for steadying Lateral = unable to elicit a step in either direction.  Goal status: MET  5.   Pt will improve 5x sit<>stand with BUE support to less than or equal to 22 sec to demonstrate improved  functional strength and transfer efficiency.  Baseline: 26.57 seconds with BUE support  Goal status: INITIAL    LONG TERM GOALS: Target date: 04/18/2022  Pt will be independent with final HEP for improved strength, balance, transfers, and gait.  Baseline:  Goal status: INITIAL  2.   Pt will improve gait velocity to at least 2.4 ft/sec with LRAD for improved gait efficiency and safety.   Baseline: 14.96 seconds with SPC = 2.19 ft/sec Goal status: INITIAL  3.   Pt will ambulate at least 500' ft indoor and outdoor surfaces, appropriate assistive device with supervision for imrpoved community gait.   Baseline:  Goal status: INITIAL  4.  Pt will recover posterior and anterior balance in push and release test in 2-3 or less steps independently, for improved balance recovery.   Baseline: Anterior = 4-5 small steps Posterior = 5-6 small steps, min A for steadying Goal status: INITIAL  5.  Pt will improve 5x sit<>stand with BUE support to less than or equal to 22 sec to demonstrate improved functional strength and transfer efficiency. Baseline: 26.57 seconds with BUE support  Goal status: INITIAL  6.  Pt will improve cog TUG to 17 seconds or less in order to demo improved dual tasking/decr fall risk.  Baseline: 22.38 sec with SPC  Goal status: INITIAL    ASSESSMENT:  CLINICAL IMPRESSION: Arrived no charge    OBJECTIVE IMPAIRMENTS Abnormal gait, decreased activity tolerance, decreased balance, decreased cognition, decreased coordination, decreased endurance, decreased knowledge of use of DME, difficulty walking, decreased strength, decreased safety awareness, impaired flexibility, impaired sensation, impaired tone, postural dysfunction, and pain.   ACTIVITY LIMITATIONS  stairs, transfers, bed mobility, and locomotion level  PARTICIPATION LIMITATIONS: cleaning and community activity  PERSONAL FACTORS Behavior pattern, Past/current experiences, Time since onset of  injury/illness/exacerbation, and 3+ comorbidities: neck and back pain, PD -dx in March 2022 per pt report, hx of ETOH abuse, CAD, COPD, emphysema, GERD, HTN, high cholesterol, PVD  are also affecting patient's functional outcome.   REHAB POTENTIAL: Good  CLINICAL DECISION MAKING: Evolving/moderate complexity  EVALUATION COMPLEXITY: Moderate  PLAN: PT FREQUENCY: 2x/week  PT DURATION: 12 weeks  PLANNED INTERVENTIONS: Therapeutic exercises, Therapeutic activity, Neuromuscular re-education, Balance training, Gait training, Patient/Family education, Self Care, Stair training, DME instructions, and Re-evaluation  PLAN FOR NEXT SESSION: Assess goals. Did pt take medication? Pt has treadmill at home and wants to try it in clinic (unsure about this one). Stepping strategies for balance, weight shifting/SLS, continued education on techniques for freezing/festination episodes. Cone taps on unlevel surfaces, rebounder drills    Presley Summerlin E Tima Curet, PT, DPT  03/19/2022, 1:32 PM

## 2022-03-19 NOTE — Patient Instructions (Addendum)
    PWR! Hand Exercises  Then, start with elbows bent and hands closed: PWR! Hands: Push hands out BIG. Elbows straight, wrists up, fingers open and spread apart BIG.  PWR! Step: Touch index finger to thumb while keeping other fingers straight. Flick fingers out BIG (thumb out/straighten fingers). Repeat with other fingers. (Step your thumb to each finger).   With arms stretched out in front of you (elbows straight), perform the following: PWR! Rock:  Move wrists up and down [employer]! Twist: Twist palms up and down BIG  ** Make each movement big and deliberate so that you feel the movement.  Perform at least 10 repetitions 1x/day, but perform PWR! Hands throughout the day when you are having trouble using your hands (picking up/manipulating small objects, writing, eating, typing, sewing, buttoning, etc.).     Coordination Exercises  Perform the following exercises for 20 minutes 1 times per day. Perform with both hand(s). Perform using big movements.  Flipping Cards: Place deck of cards on the table. Flip cards over by opening your hand big to grasp and then turn your palm up big, opening hand fully to release. Deal cards: Hold 1/2 or whole deck in your hand. Use thumb to push card off top of deck with one big push. Place card on tabletop.  Then flick fingers (extend fingers) powerfully to slide card off table (can have chair/box below table to catch the cards). Rotate ball with fingertips: Pick up with fingers/thumb and move as much as you can with each turn/movement (clockwise and counter-clockwise). Toss ball in the air and catch with the same hand: Toss big/high.  Deliberately open with toss and deliberately close hand after catch. Rotate 2 golf balls in your hand: Both directions. Pick up coins and stack one at a time: Open hand big and pick up with big, intentional movements. Do not drag coin to the edge. (5-10 in a stack) Pick up 5-10 coins one at a time and hold in palm. Then,  move coins from palm to fingertips one at time and place in coin bank/container. Practice writing: Slow down, write big, and focus on forming each letter.

## 2022-03-21 ENCOUNTER — Ambulatory Visit: Payer: No Typology Code available for payment source | Admitting: Physical Therapy

## 2022-03-21 ENCOUNTER — Ambulatory Visit: Payer: No Typology Code available for payment source | Admitting: Occupational Therapy

## 2022-03-21 ENCOUNTER — Ambulatory Visit: Payer: No Typology Code available for payment source | Admitting: Speech Pathology

## 2022-03-21 VITALS — BP 119/64 | HR 74

## 2022-03-21 DIAGNOSIS — R293 Abnormal posture: Secondary | ICD-10-CM

## 2022-03-21 DIAGNOSIS — R278 Other lack of coordination: Secondary | ICD-10-CM | POA: Diagnosis not present

## 2022-03-21 DIAGNOSIS — R2689 Other abnormalities of gait and mobility: Secondary | ICD-10-CM

## 2022-03-21 DIAGNOSIS — R29818 Other symptoms and signs involving the nervous system: Secondary | ICD-10-CM | POA: Diagnosis not present

## 2022-03-21 DIAGNOSIS — R41840 Attention and concentration deficit: Secondary | ICD-10-CM

## 2022-03-21 DIAGNOSIS — R29898 Other symptoms and signs involving the musculoskeletal system: Secondary | ICD-10-CM | POA: Diagnosis not present

## 2022-03-21 DIAGNOSIS — R471 Dysarthria and anarthria: Secondary | ICD-10-CM | POA: Diagnosis not present

## 2022-03-21 DIAGNOSIS — R251 Tremor, unspecified: Secondary | ICD-10-CM | POA: Diagnosis not present

## 2022-03-21 DIAGNOSIS — M6281 Muscle weakness (generalized): Secondary | ICD-10-CM

## 2022-03-21 DIAGNOSIS — R1312 Dysphagia, oropharyngeal phase: Secondary | ICD-10-CM | POA: Diagnosis not present

## 2022-03-21 DIAGNOSIS — R2681 Unsteadiness on feet: Secondary | ICD-10-CM

## 2022-03-21 DIAGNOSIS — R41844 Frontal lobe and executive function deficit: Secondary | ICD-10-CM

## 2022-03-21 NOTE — Therapy (Signed)
OUTPATIENT OCCUPATIONAL THERAPY PARKINSON'S EVALUATION  Patient Name: Lee [name] MRN: [id number] DOB:1946-06-10, 76 y.o., male Today's Date: 03/21/2022  PCP: Dr. Lew Dawes REFERRING PROVIDER: Dr. Landry Mellow   OT End of Session - 03/21/22 0807     Visit Number 3    Number of Visits 25    Date for OT Re-Evaluation 05/17/22    Authorization Time Period through 06/01/22- most likely approved for 15 visits still need to check    Authorization - Visit Number 3    Authorization - Number of Visits 15    OT Start Time 0804    OT Stop Time 7939    OT Time Calculation (min) 40 min    Activity Tolerance Patient tolerated treatment well    Behavior During Therapy Flat affect                Past Medical History:  Diagnosis Date   Acid reflux    Alcohol abuse    quit 10 y ago   Arthritis    BPH (benign prostatic hyperplasia)    CAD (coronary artery disease)    Cervical spondylosis without myelopathy 06/02/2015   COPD (chronic obstructive pulmonary disease) (HCC)    Emphysema lung (HCC)    GERD (gastroesophageal reflux disease)    High cholesterol    Hypertension    Parkinson's disease ([place])    PVD (peripheral vascular disease) ([place])    Tremor 06/02/2015   Past Surgical History:  Procedure Laterality Date   TONSILLECTOMY     VASECTOMY     Patient Active Problem List   Diagnosis Date Noted   Leukopenia 02/28/2022   Acquired deformities of toe(s), unspecified, unspecified foot 11/27/2021   Asthma 11/27/2021   Cervicalgia 11/27/2021   Corns and callosities 11/27/2021   Disturbance of skin sensation 11/27/2021   Elevated prostate specific antigen (PSA) 11/27/2021   Excessive attrition of teeth 11/27/2021   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   Family history of cancer 11/27/2021   Family history of malignant neoplasm of prostate 11/27/2021   Hyperlipidemia 11/27/2021   Iron deficiency anemia, unspecified 11/27/2021   Moderate recurrent major  depression ([place]) 11/27/2021   Myalgia 11/27/2021   Nail dystrophy 11/27/2021   Benign neoplasm of other parts of mouth 11/27/2021   Open angle with borderline findings, high risk, left eye 11/27/2021   Other abnormal glucose 11/27/2021   Other reduced mobility 11/27/2021   Other secondary cataract, bilateral 11/27/2021   Pain in right thigh 11/27/2021   Pain in right knee 11/27/2021   Pain in unspecified shoulder 11/27/2021   Persons encountering health services in other specified circumstances 11/27/2021   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/27/2021   Right lower quadrant pain 11/27/2021   Tinnitus 11/27/2021   Trochanteric bursitis, right hip 11/27/2021   Unilateral primary osteoarthritis, right hip 11/27/2021   Unspecified fall, initial encounter 11/27/2021   Vitamin D deficiency, unspecified 11/27/2021   Weakness 11/27/2021   Constipation 08/28/2021   Chronic obstructive pulmonary disease ([place]) 05/31/2021   Depression 04/13/2021   Parkinson's disease ([place]) 02/28/2021   Weakness of both legs 02/01/2021   Dysphagia 02/01/2021   Weakness of right leg 01/06/2021   Lumbar radiculopathy 12/30/2020   Prostate cancer ([place]) 06/09/2020   Well adult exam 06/04/2019   BPH (benign prostatic hyperplasia) 06/04/2019   Bilateral carotid bruits 06/04/2019   Meniere disease, 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   Emphysema of lung ([place]) 06/04/2019   Spondylolisthesis at L4-L5 level  08/27/2017   Spinal stenosis in cervical region 08/27/2017   Tremor 06/02/2015   Cervical spondylosis without myelopathy 06/02/2015   Alcoholism in remission ([place]) 06/09/2014   Coronary arteriosclerosis 07/30/2009   History of colonoscopy 07/30/2009   Neutropenia ([place]) 07/30/2008   HIP PAIN 11/27/2007   Essential hypertension 07/25/2007   Atrial fibrillation ([place]) 07/25/2007   GERD 07/25/2007   ERECTILE DYSFUNCTION 07/25/2007   LOW BACK PAIN  07/25/2007    ONSET DATE: 02/06/22-referral date  REFERRING DIAG: Parkinson's disease; Z74.09,Z78.9 (ICD-10-CM) - Impaired mobility and ADLs   THERAPY DIAG:  Muscle weakness (generalized)  Other lack of coordination  Unsteadiness on feet  Attention and concentration deficit  Frontal lobe and executive function deficit  Tremor  Other symptoms and signs involving the musculoskeletal system  Other symptoms and signs involving the nervous system  Abnormal posture  SUBJECTIVE:   SUBJECTIVE STATEMENT: Pt reports hip pain  Pt accompanied by: self  PERTINENT HISTORY:   Parkinson's disease--dx in March 2022 per pt report.    Other PMH:  neck and back pain, arthritis, hx of ETOH abuse, CAD, COPD, emphysema, GERD, HTN, high cholesterol, PVD, chronic vertigo per pt   PRECAUTIONS: Fall  WEIGHT BEARING RESTRICTIONS No  PAIN:  Are you having pain? No--nothing more than ususal  FALLS: Has patient fallen in last 6 months? multiple "lost count"   pt reports that he falls daily, but can usually catch himself--bumps and bruises  LIVING ENVIRONMENT: Lives with: lives alone.   Lives in: House/apartment Stairs: Yes: Internal: ) steps; none and External: 3 steps; can reach both Has following equipment at home: Single point cane  PLOF: Independent  PATIENT GOALS move better, stay independent   HAND DOMINANCE: Right  OBJECTIVE:   TODAY'S TREATMENT: PWR! Moves basic 4 supine, mod v.c demonstration and facilitation for amplitude and performance , increased time required. PWR! Up and rock in modified quadraped, min-mod v.c and facilitation. Arm bike x 5 mins , level 1 for conditioning, pt maintained > 40 rpm  PATIENT EDUCATION: Education details: PWR! Supine, basic 4, and PWR! Up and rock for modified quadraped- did not issue yet as pt needs review. Person educated: Patient Education method: Explanation, Demonstration, Tactile cues, Verbal cues,  Education comprehension:  verbalized understanding, returned demonstration, verbal cues required, tactile cues required, and needs further education   HOME EXERCISE PROGRAM: 03/19/22:  PWR! Hands (basic 4) and coordination HEP with focus on large amplitude movements   03/21/22- PWR! moves supine basic 4  ASSESSMENT:  CLINICAL IMPRESSION: Pt  is progressing towards goals. He can benefit from review of PWR! Supine, prior to issue.  PERFORMANCE DEFICITS in functional skills including ADLs, IADLs, coordination, dexterity, tone, ROM, FMC, GMC, mobility, balance, decreased knowledge of precautions, decreased knowledge of use of DME, and UE functional use, cognitive skills including attention and memory, and psychosocial skills including habits and routines and behaviors.   IMPAIRMENTS are limiting patient from ADLs and IADLs.   COMORBIDITIES has co-morbidities such as CAD, COPD, HTN, hx of neck and back pain  that affects occupational performance. Patient will benefit from skilled OT to address above impairments and improve overall function.  MODIFICATION OR ASSISTANCE TO COMPLETE EVALUATION: Min-Moderate modification of tasks or assist with assess necessary to complete an evaluation.  OT OCCUPATIONAL PROFILE AND HISTORY: Detailed assessment: Review of records and additional review of physical, cognitive, psychosocial history related to current functional performance.  CLINICAL DECISION MAKING: Moderate - several treatment options, min-mod task modification necessary  REHAB  POTENTIAL: Good  EVALUATION COMPLEXITY: Moderate   GOALS: Potential Goals reviewed with patient? Yes  SHORT TERM GOALS: Target date:   Pt will be independent with PD-specific HEP.  Goal status: INITIAL  2.  Pt will verbalize understanding of ways to decr risk of future complications and appropriate community resources. Goal status: INITIAL  3.  Baseline:Pt will report incr ease with grooming tasks and ability to use only RUE.  Goal status:  INITIAL  4.  Pt will demonstrate improved ease with fastening buttons as evidenced by decreasing 3 button/ unbutton time to:33 secs or less    Baseline: 35.41 Goal status: INITIAL  5 Pt will demonstrate improved LUE functional use as evidenced by increasing box/ blocks score by 3 blocks.  Baseline: R 53, L 49  LONG TERM GOALS: Target date: 05/17/2022  Pt will verbalize understanding of adaptive strategies/AE to incr ease and safety with ADLs/IADLs.(Cutting food, opening containers) Goal status: INITIAL  2.  Pt will demonstrate increased ease with dressing as eveidenced by decreasing PPT#4(don/ doff jacket) to 13 secs or less Baseline: 15.88sec Goal status: INITIAL  3.  Pt will be able to write at least 3 sentences with good legibility and only  no significant decr. In letter size Goal status: INITIAL  4.  Pt will improve balance/functional reach as shown by reaching at least 10" with each UE on standing functional reach test.  Baseline: R-8", L-9" Goal status: INITIAL      PLAN: OT FREQUENCY: 2x/week  OT DURATION: 12 weeks, may modify due to scheduling/progress  PLANNED INTERVENTIONS: self care/ADL training, therapeutic exercise, therapeutic activity, neuromuscular re-education, manual therapy, passive range of motion, balance training, functional mobility training, aquatic therapy, ultrasound, paraffin, fluidotherapy, moist heat, cryotherapy, patient/family education, cognitive remediation/compensation, visual/perceptual remediation/compensation, energy conservation, coping strategies training, and DME and/or AE instructions  RECOMMENDED OTHER SERVICES: scheduled for evals with PT and ST  CONSULTED AND AGREED WITH PLAN OF CARE: Patient  PLAN FOR NEXT SESSION:  issue PWR! Moves in supine and ?quadruped, functional reaching with large amplitude movements.   Spiros Greenfeld, OTR/L 03/21/2022, 8:08 AM  [facility] [address] [place],  [place]  [address] [phone] phone [phone] 03/21/22 8:08 AM

## 2022-03-21 NOTE — Therapy (Signed)
OUTPATIENT SPEECH LANGUAGE PATHOLOGY TREATMENT NOTE   Patient Name: Lee [name] MRN: [id number] DOB:06/25/1946, 76 y.o., male Today's Date: 03/21/2022  PCP: Dr. Lew [name]  REFERRING PROVIDER: Harvie Heck, MD   END OF SESSION:   End of Session - 03/21/22 0849     Visit Number 6    Number of Visits 25    Date for SLP Re-Evaluation 05/16/22    SLP Start Time 0845    SLP Stop Time  0930    SLP Time Calculation (min) 45 min    Activity Tolerance Patient tolerated treatment well;Patient limited by fatigue               Past Medical History:  Diagnosis Date   Acid reflux    Alcohol abuse    quit 10 y ago   Arthritis    BPH (benign prostatic hyperplasia)    CAD (coronary artery disease)    Cervical spondylosis without myelopathy 06/02/2015   COPD (chronic obstructive pulmonary disease) ([place])    Emphysema lung ([place])    GERD (gastroesophageal reflux disease)    High cholesterol    Hypertension    Parkinson's disease ([place])    PVD (peripheral vascular disease) ([place])    Tremor 06/02/2015   Past Surgical History:  Procedure Laterality Date   TONSILLECTOMY     VASECTOMY     Patient Active Problem List   Diagnosis Date Noted   Leukopenia 02/28/2022   Acquired deformities of toe(s), unspecified, unspecified foot 11/27/2021   Asthma 11/27/2021   Cervicalgia 11/27/2021   Corns and callosities 11/27/2021   Disturbance of skin sensation 11/27/2021   Elevated prostate specific antigen (PSA) 11/27/2021   Excessive attrition of teeth 11/27/2021   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   Family history of cancer 11/27/2021   Family history of malignant neoplasm of prostate 11/27/2021   Hyperlipidemia 11/27/2021   Iron deficiency anemia, unspecified 11/27/2021   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[place]) 11/27/2021   Myalgia 11/27/2021   Nail dystrophy 11/27/2021   Benign neoplasm of other parts of mouth 11/27/2021   Open angle with borderline findings,  high risk, left eye 11/27/2021   Other abnormal glucose 11/27/2021   Other reduced mobility 11/27/2021   Other secondary cataract, bilateral 11/27/2021   Pain in right thigh 11/27/2021   Pain in right knee 11/27/2021   Pain in unspecified shoulder 11/27/2021   Persons encountering health services in other specified circumstances 11/27/2021   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/27/2021   Right lower quadrant pain 11/27/2021   Tinnitus 11/27/2021   Trochanteric bursitis, right hip 11/27/2021   Unilateral primary osteoarthritis, right hip 11/27/2021   Unspecified fall, initial encounter 11/27/2021   Vitamin D deficiency, unspecified 11/27/2021   Weakness 11/27/2021   Constipation 08/28/2021   Chronic obstructive pulmonary disease ([place]) 05/31/2021   Depression 04/13/2021   Parkinson's disease ([place]) 02/28/2021   Weakness of both legs 02/01/2021   Dysphagia 02/01/2021   Weakness of right leg 01/06/2021   Lumbar radiculopathy 12/30/2020   Prostate cancer ([place]) 06/09/2020   Well adult exam 06/04/2019   BPH (benign prostatic hyperplasia) 06/04/2019   Bilateral carotid bruits 06/04/2019   Meniere disease, 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   Emphysema of lung ([place]) 06/04/2019   Spondylolisthesis at L4-L5 level 08/27/2017   Spinal stenosis in cervical region 08/27/2017   Tremor 06/02/2015   Cervical spondylosis without myelopathy 06/02/2015   Alcoholism in remission ([place]) 06/09/2014   Coronary arteriosclerosis  07/30/2009   History of colonoscopy 07/30/2009   Neutropenia ([place]) 07/30/2008   HIP PAIN 11/27/2007   Essential hypertension 07/25/2007   Atrial fibrillation ([place]) 07/25/2007   GERD 07/25/2007   ERECTILE DYSFUNCTION 07/25/2007   LOW BACK PAIN 07/25/2007    ONSET DATE: March 2022 PD dx  REFERRING DIAG:  R13.10 (ICD-10-CM) - Dysphagia   THERAPY DIAG:  Dysarthria and anarthria  Rationale for Evaluation and  Treatment Rehabilitation  SUBJECTIVE: "I am trying the best I can"  PAIN:  Are you having pain? Yes 3/10 R Leg, hip  OBJECTIVE:   TODAY'S TREATMENT:  03-21-22: Pt enters with reduced volume and intelligibility (mid 60's dB). SLP with frequent verbal cueing and requests for repetition. Target increasing communication effectiveness through increase of vocal intensity and clarity using intentional speech. Generated personal goal of reducing requests for repetitions. Benefit from cue to "use drill instructor voice." Given this cue, pt able to increase volume and clarity. SLP provided instruction for slow rate and over-articulation to aid in clarity of speech. Mild improvement of volume using intentional speech to conversation, though reduced cohesiveness noted of telling personal story.   03-07-22: Pt c/o feeling stressed related to MBSS results indicated WFL swallow. Again, reviewed results of MBSS, including hospital SLP indicating further esophageal assessment may be warranted given pt complaints. Provided education re: use of intent/focus to optimize swallow function as well as general safe swallow precautions. Pt indicated he does not consistently sit up for all PO intake and often does not drink during meals. SLP explained rationale for related strategies to optimize bolus flow, especially if esophogeal component present. Following discussion, pt noted to consume consecutive sips x4 after education for single, small sips given reports of choking on water. Pt debated SLP instructions given his varied swallowing. Educated possible factors to consider for optimal safety during swallowing (fatigue, medication scheduling, distraction). SLP re-iterated these are general swallow precautions and would benefit patient given significant difficulty self-reported. Pt also endorsed choking on saliva, in which SLP educated techniques for saliva management. Pt has not been completing speech HEP due to stress and  fatigue. SLP educated prioritizing tasks for maximal benefits for speech and overall productivity.   03-05-22: Pt enters with reduced volume and intelligibility (mid 60's dB). Reviewed MBSS with Lee [name], he verbalizes frustration with normal MBSS as he continues to get choked on water. General swallow precautions reviewed. Targeted improving vocal quality and increasing intensity through progressively difficulty speech tasks using Speak Out! program, lesson 2. ST leads pt through exercises providing usual model prior to pt execution. Occasional min-A required to achieve target dB this date. Averages this date: loud "ah" 89 dB; reading 78 dB; cognitive speech task 75 dB. Side comments and conversational sample of approx 3-5 minutes, pt averages 70 dB with usual min-A.  03-01-22: Pt entered with decreased vocal clarity and intensity this session (mid to upper 60s dB) requiring intermittent cued repetition to optimize listener comprehension. Pt reported feeling fatigued and light headed following PT session, which improved with cued abdominal breathing and rest break. Pt able to recall conversation with MD re: when to take PD medications. SLP confirmed MBSS scheduled for tomorrow at 11:30 AM to aid recall. Targeted improving vocal quality and increasing intensity through progressively difficulty speech tasks using Speak Out! program, lesson 2. ST leads pt through exercises providing usual model prior to pt execution. Occasional min-A required to achieve target dB this date. Averages this date: loud "ah" 89 dB; reading 75 dB; cognitive speech  task 75 dB. Side comments and conversational sample of approx 3-5 minutes, pt averages 70 dB with usual min-A.   02-23-22: Pt demonstrating and reporting cognitive deficits today related to reduced short term recall, attention, and problem solving. Discussed some recommendations to aid recall of recent information and problem solving for medication management (call MD for specific  instructions). Initiated education and instruction of Speak Out! 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 Targeted improving vocal quality and increasing intensity through progressively difficulty speech tasks using Speak Out! program, lesson 1. ST leads pt through exercises providing usual model prior to pt execution. Usual min-A required to achieve target dB this date. Averages this date: loud "ah" 92 dB; reading 77 dB; cognitive speech task 75 dB. Conversational sample of approx 3 minutes, pt averages 72 dB with occasional min-A.   02-21-22: Initiated training in HEP for dysarthria and compensations for dysfluency. Educated pt re: recommendation for MBSS. Loud /a/ and oral reading with occasional min to mod A. Provided information on Speak Out and how to order the Speak out work book     PATIENT EDUCATION: Education details: See above (and patient instructions) Person educated: Patient Education method: Explanation, Demonstration, Verbal cues, and Handouts Education comprehension: verbalized understanding, returned demonstration, verbal cues required, and needs further education     HOME EXERCISE PROGRAM: Speak Out     GOALS: Goals reviewed with patient? Yes   SHORT TERM GOALS: Target date: 03/26/22 Pt will complete HEP for dysarthria with occasional min A over 2 sessions Baseline:  Goal status: ongoing   2.  Pt will average 70dB 18/20 sentences with rare min A over 2 sessions Baseline: 03-07-22 Goal status: ongoing   3.  Pt will average 70dB over 8 minute simple conversation with occasional min A over 2 sessions Baseline:  Goal status: ongoing   4.  Pt will complete MBSS Baseline:  Goal status: Met   5.  Pt will follow diet modifications and swallow precautions after MBSS with occasional min A Baseline:  Goal status: ongoing     LONG TERM GOALS: Target date: 05/16/22   Pt will complete HEP for dysarthria with rare min A over 2  sessions Baseline:  Goal status: ongoing   2.  Pt will complete HEP for dysphagia if indicated s/p MBSS with occasional min A Baseline:  Goal status: ongoing   3.  Pt will average 70 dB during 15 minute conversation with rare min A over 2 sessions Baseline:  Goal status: ongoing   4.  Pt will improve score on Communicative Effectiveness Survey Baseline: 13/32 Goal status: ongoing     ASSESSMENT:   CLINICAL IMPRESSION: Patient is a 76 y.o. male who was seen today for dysphagia and dysarthria. Conducted ongoing education and training of recommended swallow precautions to optimize safety during swallowing as well as Speak Out! Program to maximize patient vocal intensity and clarity. Recommend skilled ST to maximize intelligibility for safety, QOL and safety of swallow.    OBJECTIVE IMPAIRMENTS  Objective impairments include memory, dysphagia, and dysarthria. These impairments are limiting patient from effectively communicating at home and in community and safety when swallowing. Factors affecting potential to achieve goals and functional outcome are medical prognosis. Patient will benefit from skilled SLP services to address above impairments and improve overall function.   REHAB POTENTIAL: Good   PLAN: SLP FREQUENCY: 2x/week   SLP DURATION: 12 weeks   PLANNED INTERVENTIONS: Aspiration precaution training, Pharyngeal strengthening exercises, Diet toleration management ,  Language facilitation, Environmental controls, Trials of upgraded texture/liquids, Cueing hierachy, Cognitive reorganization, Internal/external aids, Functional tasks, Multimodal communication approach, and SLP instruction and feedback    Su Monks, [place] 03/21/2022, 8:50 AM

## 2022-03-21 NOTE — Therapy (Signed)
OUTPATIENT PHYSICAL THERAPY NEURO TREATMENT   Patient Name: Eufemio Strahm MRN: [id number] DOB:Dec 28, 1945, 76 y.o., male Today's Date: 03/21/2022   PCP: Cassandria Anger, MD REFERRING PROVIDER: Harvie Heck, MD    PT End of Session - 03/21/22 0933     Visit Number 8    Number of Visits 16    Date for PT Re-Evaluation 05/22/22    Authorization Type VA - 15 visits approved from 12/29/21 - 04/28/22    Authorization - Visit Number 1    Authorization - Number of Visits 15    PT Start Time 0932    PT Stop Time 1011    PT Time Calculation (min) 39 min    Activity Tolerance Patient limited by lethargy    Behavior During Therapy Flat affect;WFL for tasks assessed/performed                Past Medical History:  Diagnosis Date   Acid reflux    Alcohol abuse    quit 10 y ago   Arthritis    BPH (benign prostatic hyperplasia)    CAD (coronary artery disease)    Cervical spondylosis without myelopathy 06/02/2015   COPD (chronic obstructive pulmonary disease) ([place])    Emphysema lung ([place])    GERD (gastroesophageal reflux disease)    High cholesterol    Hypertension    Parkinson's disease ([place])    PVD (peripheral vascular disease) ([place])    Tremor 06/02/2015   Past Surgical History:  Procedure Laterality Date   TONSILLECTOMY     VASECTOMY     Patient Active Problem List   Diagnosis Date Noted   Leukopenia 02/28/2022   Acquired deformities of toe(s), unspecified, unspecified foot 11/27/2021   Asthma 11/27/2021   Cervicalgia 11/27/2021   Corns and callosities 11/27/2021   Disturbance of skin sensation 11/27/2021   Elevated prostate specific antigen (PSA) 11/27/2021   Excessive attrition of teeth 11/27/2021   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   Family history of cancer 11/27/2021   Family history of malignant neoplasm of prostate 11/27/2021   Hyperlipidemia 11/27/2021   Iron deficiency anemia, unspecified 11/27/2021   Moderate recurrent major  depression ([place]) 11/27/2021   Myalgia 11/27/2021   Nail dystrophy 11/27/2021   Benign neoplasm of other parts of mouth 11/27/2021   Open angle with borderline findings, high risk, left eye 11/27/2021   Other abnormal glucose 11/27/2021   Other reduced mobility 11/27/2021   Other secondary cataract, bilateral 11/27/2021   Pain in right thigh 11/27/2021   Pain in right knee 11/27/2021   Pain in unspecified shoulder 11/27/2021   Persons encountering health services in other specified circumstances 11/27/2021   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/27/2021   Right lower quadrant pain 11/27/2021   Tinnitus 11/27/2021   Trochanteric bursitis, right hip 11/27/2021   Unilateral primary osteoarthritis, right hip 11/27/2021   Unspecified fall, initial encounter 11/27/2021   Vitamin D deficiency, unspecified 11/27/2021   Weakness 11/27/2021   Constipation 08/28/2021   Chronic obstructive pulmonary disease ([place]) 05/31/2021   Depression 04/13/2021   Parkinson's disease ([place]) 02/28/2021   Weakness of both legs 02/01/2021   Dysphagia 02/01/2021   Weakness of right leg 01/06/2021   Lumbar radiculopathy 12/30/2020   Prostate cancer ([place]) 06/09/2020   Well adult exam 06/04/2019   BPH (benign prostatic hyperplasia) 06/04/2019   Bilateral carotid bruits 06/04/2019   Meniere disease, 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   Emphysema of lung ([place]) 06/04/2019  Spondylolisthesis at L4-L5 level 08/27/2017   Spinal stenosis in cervical region 08/27/2017   Tremor 06/02/2015   Cervical spondylosis without myelopathy 06/02/2015   Alcoholism in remission ([place]) 06/09/2014   Coronary arteriosclerosis 07/30/2009   History of colonoscopy 07/30/2009   Neutropenia ([place]) 07/30/2008   HIP PAIN 11/27/2007   Essential hypertension 07/25/2007   Atrial fibrillation ([place]) 07/25/2007   GERD 07/25/2007   ERECTILE DYSFUNCTION 07/25/2007   LOW BACK PAIN  07/25/2007    ONSET DATE: 12/29/2021   REFERRING DIAG: G20 (ICD-10-CM) - Parkinson disease ([place])   THERAPY DIAG:  Unsteadiness on feet  Muscle weakness (generalized)  Other abnormalities of gait and mobility  Rationale for Evaluation and Treatment Rehabilitation  SUBJECTIVE:                                                                                                                                                                                              SUBJECTIVE STATEMENT: Pt reports he is slightly better today, has not been doing his exercises much. "I think I gotta push myself a little bit harder". No new falls but several near misses, "I am very fortunate that I catch myself".   Pt accompanied by: self  PERTINENT HISTORY: PMH:  neck and back pain, PD -dx in March 2022 per pt report, hx of ETOH abuse, CAD, COPD, emphysema, GERD, HTN, high cholesterol, PVD, pt with hx of being non-compliant with meds  PAIN:  Are you having pain? Yes: NPRS scale: 2/10 Pain location: RLE, distal to knee Pain description: Dull Aggravating factors: Depends how his body is like, pressure Relieving factors: Sitting up taller, tumeric  PRECAUTIONS: Fall  VITALS Vitals:   03/21/22 0951  BP: 119/64  Pulse: 74       FALLS: Has patient fallen in last 6 months?  No falls to the ground, but stumbles all the time, can't keep count, normally can catch himself.  Had fall on 7/31 in home - first fall in which he was not able to catch himself   PLOF: Independent with community mobility with device  PATIENT GOALS Wants to get rid of the PD and go back to where he was 2-3 years ago. Get back to walking in the park.   OBJECTIVE:   TODAY'S TREATMENT:   Therapeutic Activity  Tasked pt to teach therapist tips to reduce freezing w/turns and in crowded spaces, but pt unable to verbalize techniques. When therapist attempted to educate pt, pt cuts off therapist and states he is "already doing all  that". No evidence of proper freezing techniques have been demonstrated in clinic  Pt not receptive  to education regarding community PD resources or PWR moves at this time. Will mark STG as in progress.   Informed pt of the importance of performing HEP regularly for improved carryover from PT and functional mobility. Pt stated he will be better about performing his exercises at home.    [facility] PT Assessment - 03/21/22 0953       Transfers   Five time sit to stand comments  13.85s without UE support               Ther Ex  SciFit multi-peaks level 8 for 8 minutes using BUE/BLEs for neural priming for reciprocal movement, dynamic cardiovascular warmup and increased amplitude of stepping. Min cues to breathe through nose throughout, as pt blows through mouth. RPE of 6/10 following activity. Per pt request, assessed vitals following activity (see above)   NMR  -At counter in modified plantigrade position, thread the needles x12 per side for improved thoracic rotation, full finger extension and postural control.  -Progressed to stance jacks in modified plantigrade position for high amplitude movement, increased step clearance and facilitation of push-off in TO position. X15 reps  -Lastly performed modified plantigrade mountain climbers, x10 per side, for plyometric movement, single leg stability, lateral weight shift and push-off in TO position.    Gait pattern: step through pattern, decreased arm swing- Left, decreased step length- Right, decreased step length- Left, decreased stride length, festinating, and trunk flexed Distance walked: Various clinic distances  Assistive device utilized: Single point cane Level of assistance: SBA Comments: Min cues to stop and reset when freezing episodes occur, as pt continues to fight through freeze and lose balance anteriorly   PATIENT EDUCATION: Education details: Continue HEP, see ther act section above Person educated: Patient Education method:  Explanation, Demonstration, and Handouts Education comprehension: verbalized understanding, returned demonstration, and needs further education   HOME EXERCISE PROGRAM: [email or web address], Seated and Standing PWR moves   Access Code: [email or web address] URL: [email or web address] Date: 03/05/2022 Prepared by: Mickie Bail Jennife Zaucha  Exercises - Alternating Step Backward with Support  - 1 x daily - 5 x weekly - 1 sets - 10 reps - Alternating Step Forward with Support  - 1 x daily - 5 x weekly - 1 sets - 10 reps - Step Sideways  - 1 x daily - 5 x weekly - 1 sets - 10 reps - Standing Quarter Turn with Counter Support  - 1 x daily - 5 x weekly - 1 sets - 5 reps - Sit to Stand Without Arm Support  - 1 x daily - 7 x weekly - 3 sets - 10 reps    GOALS: Goals reviewed with patient? Yes  SHORT TERM GOALS: Target date: 03/21/2022  Pt will be independent with initial HEP for improved strength, balance, transfers, and gait.  Baseline: Pt not performing regularly  Goal status: IN PROGRESS  2.  Pt will verbalize tips to reduce festination/freezing with gait and turns.  Baseline: Pt unable to teach therapist tips when asked. However, when therapist attempts to educate pt, pt claims he is already integrating tips and does not need education  Goal status: IN PROGRESS  3.  Pt will verbalize understanding of local Parkinson's disease resources.  Baseline:  Goal status: IN PROGRESS  4.  Push and release test to be assessed with LTG written for improved balance recovery. Baseline: Anterior = 4-5 small steps Posterior = 5-6 small steps, min A for steadying Lateral = unable to elicit a step in either direction.  Goal  status: MET  5.   Pt will improve 5x sit<>stand with BUE support to less than or equal to 22 sec to demonstrate improved functional strength and transfer efficiency.  Baseline: 26.57 seconds with BUE support; 13.85s without UE support   Goal status: MET    LONG TERM GOALS: Target date:  04/18/2022  Pt will be independent with final HEP for improved strength, balance, transfers, and gait.  Baseline:  Goal status: INITIAL  2.   Pt will improve gait velocity to at least 2.4 ft/sec with LRAD for improved gait efficiency and safety.   Baseline: 14.96 seconds with SPC = 2.19 ft/sec Goal status: INITIAL  3.   Pt will ambulate at least 500' ft indoor and outdoor surfaces, appropriate assistive device with supervision for imrpoved community gait.   Baseline:  Goal status: INITIAL  4.  Pt will recover posterior and anterior balance in push and release test in 2-3 or less steps independently, for improved balance recovery.   Baseline: Anterior = 4-5 small steps Posterior = 5-6 small steps, min A for steadying Goal status: INITIAL  5.  Pt will improve 5x sit<>stand with BUE support to less than or equal to 11.5 sec to demonstrate improved functional strength and transfer efficiency. Baseline: 26.57 seconds with BUE support; 13.85s on 8/23  Goal status: REVISED  6.  Pt will improve cog TUG to 17 seconds or less in order to demo improved dual tasking/decr fall risk.  Baseline: 22.38 sec with SPC  Goal status: INITIAL    ASSESSMENT:  CLINICAL IMPRESSION: Emphasis of skilled PT session on STG assessment, high amplitude movement and postural control. Pt has met 2/5 STGs completing his push and release test and improving his time on 5x STS without need for UE support. Pt continues to be noncompliant w/HEP and refuses education on freezing techniques despite heavy education from therapist. Pt enjoyed plyometric exercises today for improved step length and amplitude of movement. Continue POC.   OBJECTIVE IMPAIRMENTS Abnormal gait, decreased activity tolerance, decreased balance, decreased cognition, decreased coordination, decreased endurance, decreased knowledge of use of DME, difficulty walking, decreased strength, decreased safety awareness, impaired flexibility, impaired  sensation, impaired tone, postural dysfunction, and pain.   ACTIVITY LIMITATIONS stairs, transfers, bed mobility, and locomotion level  PARTICIPATION LIMITATIONS: cleaning and community activity  PERSONAL FACTORS Behavior pattern, Past/current experiences, Time since onset of injury/illness/exacerbation, and 3+ comorbidities: neck and back pain, PD -dx in March 2022 per pt report, hx of ETOH abuse, CAD, COPD, emphysema, GERD, HTN, high cholesterol, PVD  are also affecting patient's functional outcome.   REHAB POTENTIAL: Good  CLINICAL DECISION MAKING: Evolving/moderate complexity  EVALUATION COMPLEXITY: Moderate  PLAN: PT FREQUENCY: 2x/week  PT DURATION: 12 weeks  PLANNED INTERVENTIONS: Therapeutic exercises, Therapeutic activity, Neuromuscular re-education, Balance training, Gait training, Patient/Family education, Self Care, Stair training, DME instructions, and Re-evaluation  PLAN FOR NEXT SESSION: Did pt take medication? Pt has treadmill at home and wants to try it in clinic (unsure about this one). Stepping strategies for balance, weight shifting/SLS, continued education on techniques for freezing/festination episodes. Cone taps on unlevel surfaces, rebounder drills, high amplitude, movement    Jamillah Camilo E Izaiyah Kleinman, PT, DPT  03/21/2022, 10:13 AM

## 2022-03-26 ENCOUNTER — Encounter: Payer: Self-pay | Admitting: Speech Pathology

## 2022-03-26 ENCOUNTER — Ambulatory Visit (INDEPENDENT_AMBULATORY_CARE_PROVIDER_SITE_OTHER): Payer: No Typology Code available for payment source

## 2022-03-26 ENCOUNTER — Encounter: Payer: Self-pay | Admitting: Physical Therapy

## 2022-03-26 ENCOUNTER — Ambulatory Visit: Payer: No Typology Code available for payment source | Admitting: Occupational Therapy

## 2022-03-26 ENCOUNTER — Ambulatory Visit: Payer: No Typology Code available for payment source | Admitting: Physical Therapy

## 2022-03-26 ENCOUNTER — Ambulatory Visit: Payer: No Typology Code available for payment source | Admitting: Speech Pathology

## 2022-03-26 DIAGNOSIS — R2681 Unsteadiness on feet: Secondary | ICD-10-CM

## 2022-03-26 DIAGNOSIS — R251 Tremor, unspecified: Secondary | ICD-10-CM | POA: Diagnosis not present

## 2022-03-26 DIAGNOSIS — M6281 Muscle weakness (generalized): Secondary | ICD-10-CM | POA: Diagnosis not present

## 2022-03-26 DIAGNOSIS — R471 Dysarthria and anarthria: Secondary | ICD-10-CM

## 2022-03-26 DIAGNOSIS — Z0000 Encounter for general adult medical examination without abnormal findings: Secondary | ICD-10-CM

## 2022-03-26 DIAGNOSIS — R29818 Other symptoms and signs involving the nervous system: Secondary | ICD-10-CM | POA: Diagnosis not present

## 2022-03-26 DIAGNOSIS — R2689 Other abnormalities of gait and mobility: Secondary | ICD-10-CM

## 2022-03-26 DIAGNOSIS — R293 Abnormal posture: Secondary | ICD-10-CM | POA: Diagnosis not present

## 2022-03-26 DIAGNOSIS — R278 Other lack of coordination: Secondary | ICD-10-CM | POA: Diagnosis not present

## 2022-03-26 DIAGNOSIS — R29898 Other symptoms and signs involving the musculoskeletal system: Secondary | ICD-10-CM

## 2022-03-26 DIAGNOSIS — R41840 Attention and concentration deficit: Secondary | ICD-10-CM

## 2022-03-26 DIAGNOSIS — R1312 Dysphagia, oropharyngeal phase: Secondary | ICD-10-CM | POA: Diagnosis not present

## 2022-03-26 DIAGNOSIS — R41844 Frontal lobe and executive function deficit: Secondary | ICD-10-CM

## 2022-03-26 NOTE — Therapy (Signed)
OUTPATIENT OCCUPATIONAL THERAPY PARKINSON'S TREATMENT  Patient Name: Lee [name] MRN: [id number] DOB:1946/07/01, 76 y.o., male Today's Date: 03/26/2022  PCP: Dr. Lew Dawes REFERRING PROVIDER: Dr. Landry Mellow   OT End of Session - 03/26/22 1321     Visit Number 4    Number of Visits 25    Date for OT Re-Evaluation 05/17/22    Authorization Time Period through 06/01/22- most likely approved for 15 visits still need to check    Authorization - Visit Number 4    Authorization - Number of Visits 15    OT Start Time 4098    OT Stop Time 1358    OT Time Calculation (min) 40 min    Activity Tolerance Patient tolerated treatment well    Behavior During Therapy Flat affect                 Past Medical History:  Diagnosis Date   Acid reflux    Alcohol abuse    quit 10 y ago   Arthritis    BPH (benign prostatic hyperplasia)    CAD (coronary artery disease)    Cervical spondylosis without myelopathy 06/02/2015   COPD (chronic obstructive pulmonary disease) ([place])    Emphysema lung ([place])    GERD (gastroesophageal reflux disease)    High cholesterol    Hypertension    Parkinson's disease ([place])    PVD (peripheral vascular disease) ([place])    Tremor 06/02/2015   Past Surgical History:  Procedure Laterality Date   TONSILLECTOMY     VASECTOMY     Patient Active Problem List   Diagnosis Date Noted   Leukopenia 02/28/2022   Acquired deformities of toe(s), unspecified, unspecified foot 11/27/2021   Asthma 11/27/2021   Cervicalgia 11/27/2021   Corns and callosities 11/27/2021   Disturbance of skin sensation 11/27/2021   Elevated prostate specific antigen (PSA) 11/27/2021   Excessive attrition of teeth 11/27/2021   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   Family history of cancer 11/27/2021   Family history of malignant neoplasm of prostate 11/27/2021   Hyperlipidemia 11/27/2021   Iron deficiency anemia, unspecified 11/27/2021   Moderate recurrent major  depression ([place]) 11/27/2021   Myalgia 11/27/2021   Nail dystrophy 11/27/2021   Benign neoplasm of other parts of mouth 11/27/2021   Open angle with borderline findings, high risk, left eye 11/27/2021   Other abnormal glucose 11/27/2021   Other reduced mobility 11/27/2021   Other secondary cataract, bilateral 11/27/2021   Pain in right thigh 11/27/2021   Pain in right knee 11/27/2021   Pain in unspecified shoulder 11/27/2021   Persons encountering health services in other specified circumstances 11/27/2021   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/27/2021   Right lower quadrant pain 11/27/2021   Tinnitus 11/27/2021   Trochanteric bursitis, right hip 11/27/2021   Unilateral primary osteoarthritis, right hip 11/27/2021   Unspecified fall, initial encounter 11/27/2021   Vitamin D deficiency, unspecified 11/27/2021   Weakness 11/27/2021   Constipation 08/28/2021   Chronic obstructive pulmonary disease ([place]) 05/31/2021   Depression 04/13/2021   Parkinson's disease ([place]) 02/28/2021   Weakness of both legs 02/01/2021   Dysphagia 02/01/2021   Weakness of right leg 01/06/2021   Lumbar radiculopathy 12/30/2020   Prostate cancer ([place]) 06/09/2020   Well adult exam 06/04/2019   BPH (benign prostatic hyperplasia) 06/04/2019   Bilateral carotid bruits 06/04/2019   Meniere disease, 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   Emphysema of lung ([place]) 06/04/2019   Spondylolisthesis at L4-L5  level 08/27/2017   Spinal stenosis in cervical region 08/27/2017   Tremor 06/02/2015   Cervical spondylosis without myelopathy 06/02/2015   Alcoholism in remission ([place]) 06/09/2014   Coronary arteriosclerosis 07/30/2009   History of colonoscopy 07/30/2009   Neutropenia ([place]) 07/30/2008   HIP PAIN 11/27/2007   Essential hypertension 07/25/2007   Atrial fibrillation ([place]) 07/25/2007   GERD 07/25/2007   ERECTILE DYSFUNCTION 07/25/2007   LOW BACK PAIN  07/25/2007    ONSET DATE: 02/06/22-referral date  REFERRING DIAG: Parkinson's disease; Z74.09,Z78.9 (ICD-10-CM) - Impaired mobility and ADLs   THERAPY DIAG:  Unsteadiness on feet  Muscle weakness (generalized)  Other abnormalities of gait and mobility  Other lack of coordination  Attention and concentration deficit  Frontal lobe and executive function deficit  Tremor  Other symptoms and signs involving the musculoskeletal system  Other symptoms and signs involving the nervous system  Abnormal posture  SUBJECTIVE:   SUBJECTIVE STATEMENT: Pt reports hip pain  Pt accompanied by: self  PERTINENT HISTORY:   Parkinson's disease--dx in March 2022 per pt report.    Other PMH:  neck and back pain, arthritis, hx of ETOH abuse, CAD, COPD, emphysema, GERD, HTN, high cholesterol, PVD, chronic vertigo per pt   PRECAUTIONS: Fall  WEIGHT BEARING RESTRICTIONS No  PAIN:  PAIN:  Are you having pain? Yes: NPRS scale: 2/10 Pain location: hip Pain description: aching Aggravating factors: walking Relieving factors: rest   FALLS: Has patient fallen in last 6 months? multiple "lost count"   pt reports that he falls daily, but can usually catch himself--bumps and bruises  LIVING ENVIRONMENT: Lives with: lives alone.   Lives in: House/apartment Stairs: Yes: Internal: ) steps; none and External: 3 steps; can reach both Has following equipment at home: Single point cane  PLOF: Independent  PATIENT GOALS move better, stay independent   HAND DOMINANCE: Right  OBJECTIVE:   TODAY'S TREATMENT: PWR! Moves basic 4 supine, min v.c demonstration and facilitation for amplitude and performance , increased time required. Issued as HEP PWR! Up and rock in modified quadraped, min v.c and facilitation. Education regarding sit- stand,using PWR! Up, then march in place before walking due to freezing . Pt returned demonstration. Education regarding handwriting initiated, pt demonstrates  moderate mircrographia when writing in cursive. Pt was cued to used the line and print, he demonstrates improved legibility and letter size.  PATIENT EDUCATION: Education details: PWR! Supine, basic 4, min v.c and demonstration  Person educated: Patient Education method: Explanation, Demonstration, Tactile cues, Verbal cues, handout issued Education comprehension: verbalized understanding, returned demonstration, verbal cues required, tactile cues required, and needs further education   HOME EXERCISE PROGRAM: 03/19/22:  PWR! Hands (basic 4) and coordination HEP with focus on large amplitude movements   03/26/22- PWR! moves supine basic 4  ASSESSMENT: GOALS: Potential Goals reviewed with patient? Yes  SHORT TERM GOALS: Target date: 04/06/22- extended due to scheduling  Pt will be independent with PD-specific HEP.  Goal status: INITIAL  2.  Pt will verbalize understanding of ways to decr risk of future complications and appropriate community resources. Goal status: INITIAL  3.  Baseline:Pt will report incr ease with grooming tasks and ability to use only RUE.  Goal status: INITIAL  4.  Pt will demonstrate improved ease with fastening buttons as evidenced by decreasing 3 button/ unbutton time to:33 secs or less    Baseline: 35.41 Goal status: INITIAL  5 Pt will demonstrate improved LUE functional use as evidenced by increasing box/ blocks score by 3  blocks.  Baseline: R 53, L 49  LONG TERM GOALS: Target date: 05/17/2022  Pt will verbalize understanding of adaptive strategies/AE to incr ease and safety with ADLs/IADLs.(Cutting food, opening containers) Goal status: INITIAL  2.  Pt will demonstrate increased ease with dressing as eveidenced by decreasing PPT#4(don/ doff jacket) to 13 secs or less Baseline: 15.88sec Goal status: INITIAL  3.  Pt will be able to write at least 3 sentences with good legibility and only  no significant decr. In letter size Goal status: INITIAL  4.   Pt will improve balance/functional reach as shown by reaching at least 10" with each UE on standing functional reach test.  Baseline: R-8", L-9" Goal status: INITIAL  CLINICAL IMPRESSION: Pt is progressing towards goals. He demonstrates improved performance of PWR! Moves in supine following review.  PERFORMANCE DEFICITS in functional skills including ADLs, IADLs, coordination, dexterity, tone, ROM, FMC, GMC, mobility, balance, decreased knowledge of precautions, decreased knowledge of use of DME, and UE functional use, cognitive skills including attention and memory, and psychosocial skills including habits and routines and behaviors.   IMPAIRMENTS are limiting patient from ADLs and IADLs.   COMORBIDITIES has co-morbidities such as CAD, COPD, HTN, hx of neck and back pain  that affects occupational performance. Patient will benefit from skilled OT to address above impairments and improve overall function.  MODIFICATION OR ASSISTANCE TO COMPLETE EVALUATION: Min-Moderate modification of tasks or assist with assess necessary to complete an evaluation.  OT OCCUPATIONAL PROFILE AND HISTORY: Detailed assessment: Review of records and additional review of physical, cognitive, psychosocial history related to current functional performance.  CLINICAL DECISION MAKING: Moderate - several treatment options, min-mod task modification necessary  REHAB POTENTIAL: Good  EVALUATION COMPLEXITY: Moderate      PLAN: OT FREQUENCY: 2x/week  OT DURATION: 12 weeks, may modify due to scheduling/progress  PLANNED INTERVENTIONS: self care/ADL training, therapeutic exercise, therapeutic activity, neuromuscular re-education, manual therapy, passive range of motion, balance training, functional mobility training, aquatic therapy, ultrasound, paraffin, fluidotherapy, moist heat, cryotherapy, patient/family education, cognitive remediation/compensation, visual/perceptual remediation/compensation, energy  conservation, coping strategies training, and DME and/or AE instructions  RECOMMENDED OTHER SERVICES: scheduled for evals with PT and ST  CONSULTED AND AGREED WITH PLAN OF CARE: Patient  PLAN FOR NEXT SESSION:  continue handwriting/ issue handout, ADL strategies. functional reaching with large amplitude movements.   Reve Crocket, OTR/L 03/26/2022, 1:56 PM  [facility] [place], [place]  [address] [phone] phone [phone] 03/26/22 1:56 PM

## 2022-03-26 NOTE — Therapy (Signed)
OUTPATIENT SPEECH LANGUAGE PATHOLOGY TREATMENT NOTE   Patient Name: Lee [name] MRN: [id number] DOB:04/04/1946, 76 y.o., male Today's Date: 03/26/2022  PCP: Dr. Lew Dawes  REFERRING PROVIDER: Harvie Heck, MD   END OF SESSION:   End of Session - 03/26/22 1237     Visit Number 7    Number of Visits 25    Date for SLP Re-Evaluation 05/16/22    SLP Start Time 43    SLP Stop Time  2778    SLP Time Calculation (min) 45 min               Past Medical History:  Diagnosis Date   Acid reflux    Alcohol abuse    quit 10 y ago   Arthritis    BPH (benign prostatic hyperplasia)    CAD (coronary artery disease)    Cervical spondylosis without myelopathy 06/02/2015   COPD (chronic obstructive pulmonary disease) ([place])    Emphysema lung ([place])    GERD (gastroesophageal reflux disease)    High cholesterol    Hypertension    Parkinson's disease ([place])    PVD (peripheral vascular disease) (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   Family history of cancer 11/27/2021   Family history of malignant neoplasm of prostate 11/27/2021   Hyperlipidemia 11/27/2021   Iron deficiency anemia, unspecified 11/27/2021   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[place]) 11/27/2021   Myalgia 11/27/2021   Nail dystrophy 11/27/2021   Benign neoplasm of other parts of mouth 11/27/2021   Open angle with borderline findings, high risk, left eye 11/27/2021   Other abnormal glucose 11/27/2021   Other  reduced mobility 11/27/2021   Other secondary cataract, bilateral 11/27/2021   Pain in right thigh 11/27/2021   Pain in right knee 11/27/2021   Pain in unspecified shoulder 11/27/2021   Persons encountering health services in other specified circumstances 11/27/2021   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/27/2021   Right lower quadrant pain 11/27/2021   Tinnitus 11/27/2021   Trochanteric bursitis, right hip 11/27/2021   Unilateral primary osteoarthritis, right hip 11/27/2021   Unspecified fall, initial encounter 11/27/2021   Vitamin D deficiency, unspecified 11/27/2021   Weakness 11/27/2021   Constipation 08/28/2021   Chronic obstructive pulmonary disease ([place]) 05/31/2021   Depression 04/13/2021   Parkinson's disease ([place]) 02/28/2021   Weakness of both legs 02/01/2021   Dysphagia 02/01/2021   Weakness of right leg 01/06/2021   Lumbar radiculopathy 12/30/2020   Prostate cancer ([place]) 06/09/2020   Well adult exam 06/04/2019   BPH (benign prostatic hyperplasia) 06/04/2019   Bilateral carotid bruits 06/04/2019   Meniere disease, 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   Emphysema of lung ([place]) 06/04/2019   Spondylolisthesis at L4-L5 level 08/27/2017   Spinal stenosis in cervical region 08/27/2017   Tremor 06/02/2015   Cervical spondylosis without myelopathy 06/02/2015   Alcoholism in remission ([place]) 06/09/2014   Coronary arteriosclerosis 07/30/2009   History of colonoscopy 07/30/2009   Neutropenia ([place]) 07/30/2008  HIP PAIN 11/27/2007   Essential hypertension 07/25/2007   Atrial fibrillation ([place]) 07/25/2007   GERD 07/25/2007   ERECTILE DYSFUNCTION 07/25/2007   LOW BACK PAIN 07/25/2007    ONSET DATE: March 2022 PD dx  REFERRING DIAG:  R13.10 (ICD-10-CM) - Dysphagia   THERAPY DIAG:  Dysarthria and anarthria  Rationale for Evaluation and Treatment Rehabilitation  SUBJECTIVE: "Not that much" re: HEP for  speech   PAIN:  Are you having pain? Yes 3/10 R Leg, hip  OBJECTIVE:   TODAY'S TREATMENT:   03-26-22: Lee [name] reports he  self advocates when making personal and business phone calls to prepare his listener to focus on his speech.  Targeted improving vocal quality and increasing intensity through progressively difficulty speech tasks using Speak Out! program, lesson 10 ST leads pt through exercises providing usual model prior to pt execution.Rare min-A required to achieve target dB this date. Averages this date: loud "ah" 92 dB; reading 78dB; cognitive speech task 75 dB. In structured speech task naming photos and explaining odd one out of 4 with average Side comments and conversational sample of approx 3-5 minutes, pt averages 70 dB with usual min-A.   03-21-22: Pt enters with reduced volume and intelligibility (mid 60's dB). SLP with frequent verbal cueing and requests for repetition. Target increasing communication effectiveness through increase of vocal intensity and clarity using intentional speech. Generated personal goal of reducing requests for repetitions. Benefit from cue to "use drill instructor voice." Given this cue, pt able to increase volume and clarity. SLP provided instruction for slow rate and over-articulation to aid in clarity of speech. Mild improvement of volume using intentional speech to conversation, though reduced cohesiveness noted of telling personal story.   03-07-22: Pt c/o feeling stressed related to MBSS results indicated WFL swallow. Again, reviewed results of MBSS, including hospital SLP indicating further esophageal assessment may be warranted given pt complaints. Provided education re: use of intent/focus to optimize swallow function as well as general safe swallow precautions. Pt indicated he does not consistently sit up for all PO intake and often does not drink during meals. SLP explained rationale for related strategies to optimize bolus flow, especially if esophogeal  component present. Following discussion, pt noted to consume consecutive sips x4 after education for single, small sips given reports of choking on water. Pt debated SLP instructions given his varied swallowing. Educated possible factors to consider for optimal safety during swallowing (fatigue, medication scheduling, distraction). SLP re-iterated these are general swallow precautions and would benefit patient given significant difficulty self-reported. Pt also endorsed choking on saliva, in which SLP educated techniques for saliva management. Pt has not been completing speech HEP due to stress and fatigue. SLP educated prioritizing tasks for maximal benefits for speech and overall productivity.   03-05-22: Pt enters with reduced volume and intelligibility (mid 60's dB). Reviewed MBSS with Lee [name], he verbalizes frustration with normal MBSS as he continues to get choked on water. General swallow precautions reviewed. Targeted improving vocal quality and increasing intensity through progressively difficulty speech tasks using Speak Out! program, lesson 2. ST leads pt through exercises providing usual model prior to pt execution. Occasional min-A required to achieve target dB this date. Averages this date: loud "ah" 89 dB; reading 78 dB; cognitive speech task 75 dB. Side comments and conversational sample of approx 3-5 minutes, pt averages 70 dB with usual min-A.  03-01-22: Pt entered with decreased vocal clarity and intensity this session (mid to upper 60s dB) requiring intermittent cued repetition to optimize  listener comprehension. Pt reported feeling fatigued and light headed following PT session, which improved with cued abdominal breathing and rest break. Pt able to recall conversation with MD re: when to take PD medications. SLP confirmed MBSS scheduled for tomorrow at 11:30 AM to aid recall. Targeted improving vocal quality and increasing intensity through progressively difficulty speech tasks using Speak Out!  program, lesson 2. ST leads pt through exercises providing usual model prior to pt execution. Occasional min-A required to achieve target dB this date. Averages this date: loud "ah" 89 dB; reading 75 dB; cognitive speech task 75 dB. Side comments and conversational sample of approx 3-5 minutes, pt averages 70 dB with usual min-A.   02-23-22: Pt demonstrating and reporting cognitive deficits today related to reduced short term recall, attention, and problem solving. Discussed some recommendations to aid recall of recent information and problem solving for medication management (call MD for specific instructions). Initiated education and instruction of Speak Out! 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 Targeted improving vocal quality and increasing intensity through progressively difficulty speech tasks using Speak Out! program, lesson 1. ST leads pt through exercises providing usual model prior to pt execution. Usual min-A required to achieve target dB this date. Averages this date: loud "ah" 92 dB; reading 77 dB; cognitive speech task 75 dB. Conversational sample of approx 3 minutes, pt averages 72 dB with occasional min-A.   02-21-22: Initiated training in HEP for dysarthria and compensations for dysfluency. Educated pt re: recommendation for MBSS. Loud /a/ and oral reading with occasional min to mod A. Provided information on Speak Out and how to order the Speak out work book     PATIENT EDUCATION: Education details: See above (and patient instructions) Person educated: Patient Education method: Explanation, Demonstration, Verbal cues, and Handouts Education comprehension: verbalized understanding, returned demonstration, verbal cues required, and needs further education     HOME EXERCISE PROGRAM: Speak Out     GOALS: Goals reviewed with patient? Yes   SHORT TERM GOALS: Target date: 03/26/22 Pt will complete HEP for dysarthria with occasional min A  over 2 sessions Baseline:  Goal status: ongoing   2.  Pt will average 70dB 18/20 sentences with rare min A over 2 sessions Baseline: 03-07-22 Goal status: ongoing   3.  Pt will average 70dB over 8 minute simple conversation with occasional min A over 2 sessions Baseline:  Goal status: ongoing   4.  Pt will complete MBSS Baseline:  Goal status: Met   5.  Pt will follow diet modifications and swallow precautions after MBSS with occasional min A Baseline:  Goal status: ongoing     LONG TERM GOALS: Target date: 05/16/22   Pt will complete HEP for dysarthria with rare min A over 2 sessions Baseline:  Goal status: ongoing   2.  Pt will complete HEP for dysphagia if indicated s/p MBSS with occasional min A Baseline:  Goal status: ongoing   3.  Pt will average 70 dB during 15 minute conversation with rare min A over 2 sessions Baseline:  Goal status: ongoing   4.  Pt will improve score on Communicative Effectiveness Survey Baseline: 13/32 Goal status: ongoing     ASSESSMENT:   CLINICAL IMPRESSION: Patient is a 76 y.o. male who was seen today for dysphagia and dysarthria. Conducted ongoing education and training of recommended swallow precautions to optimize safety during swallowing as well as Speak Out! Program to maximize patient vocal intensity and clarity. Recommend skilled ST  to maximize intelligibility for safety, QOL and safety of swallow.    OBJECTIVE IMPAIRMENTS  Objective impairments include memory, dysphagia, and dysarthria. These impairments are limiting patient from effectively communicating at home and in community and safety when swallowing. Factors affecting potential to achieve goals and functional outcome are medical prognosis. Patient will benefit from skilled SLP services to address above impairments and improve overall function.   REHAB POTENTIAL: Good   PLAN: SLP FREQUENCY: 2x/week   SLP DURATION: 12 weeks   PLANNED INTERVENTIONS: Aspiration  precaution training, Pharyngeal strengthening exercises, Diet toleration management , Language facilitation, Environmental controls, Trials of upgraded texture/liquids, Cueing hierachy, Cognitive reorganization, Internal/external aids, Functional tasks, Multimodal communication approach, and SLP instruction and feedback    Haakon, Annye Rusk, [place] 03/26/2022, 1:09 PM

## 2022-03-26 NOTE — Progress Notes (Addendum)
I connected with Lee [name] today by telephone and verified that I am speaking with the correct person using two identifiers. Location patient: home Location provider: work Persons participating in the virtual visit: patient, provider.   I discussed the limitations, risks, security and privacy concerns of performing an evaluation and management service by telephone and the availability of in person appointments. I also discussed with the patient that there may be a patient responsible charge related to this service. The patient expressed understanding and verbally consented to this telephonic visit.    Interactive audio and video telecommunications were attempted between this provider and patient, however failed, due to patient having technical difficulties OR patient did not have access to video capability.  We continued and completed visit with audio only.  Some vital signs may be absent or patient reported.   Time Spent with patient on telephone encounter: 30 minutes  Subjective:   Lee [name] is a 76 y.o. male who presents for Medicare Annual/Subsequent preventive examination.  Review of Systems     Cardiac Risk Factors include: advanced age (>64mn, >>33women);hypertension;diabetes mellitus;male gender     Objective:    There were no vitals filed for this visit. There is no height or weight on file to calculate BMI.     03/26/2022    4:06 PM 02/21/2022    2:47 PM 02/21/2022    1:22 PM 12/07/2021   10:27 AM 03/24/2021   10:24 AM 03/23/2021    3:46 PM 09/21/2019    9:20 AM  Advanced Directives  Does Patient Have a Medical Advance Directive? Yes No No No Yes Yes No  Type of Advance Directive Living will;Healthcare Power of Attorney    Living will;Healthcare Power of Attorney Living will;Healthcare Power of Attorney   Does patient want to make changes to medical advance directive? No - Patient declined    No - Patient declined No - Patient declined   Copy of HSeven [place] Chart? No - copy requested     No - copy requested   Would patient like information on creating a medical advance directive?  No - Patient declined No - Patient declined No - Patient declined   No - Patient declined    Current Medications (verified) Outpatient Encounter Medications as of 03/26/2022  Medication Sig   albuterol (PROVENTIL HFA;VENTOLIN HFA) 108 (90 Base) MCG/ACT inhaler Inhale 2 puffs into the lungs every 4 (four) hours as needed for wheezing or shortness of breath.   Alum Hydroxide-Mag Carbonate 160-105 MG CHEW CHEW 1 TABLET BY MOUTH AS DIRECTED BY YOUR PROVIDER FOR STOMACH UPSET/DYSPEPSIA UP TO THREE TIMES DAILY   ascorbic acid (VITAMIN C) 500 MG tablet TAKE ONE TABLET BY MOUTH DAILY TO HELP WITH IRON STORES AND IMMUNE SYSTEM. PLEASE TAKE EVERY DAY AND ESPECIALLY WHEN YOU TAKE FERROUS SULFATE.   aspirin EC 81 MG tablet    BLACK PEPPER-TURMERIC PO Take by mouth daily. Mixes as a powder for back pain (ginger, cinnamon, black pepper, turmeric)   carbidopa-levodopa (SINEMET IR) 25-100 MG tablet Take 2 tablets by mouth 3 (three) times daily.   cetirizine (ZYRTEC) 10 MG tablet    Cholecalciferol 50 MCG (2000 UT) TABS    CINNAMON PO Mixes as a powder for back pain (ginger, cinnamon, black pepper, turmeric)   diclofenac Sodium (VOLTAREN) 1 % GEL    FEROSUL 325 (65 Fe) MG tablet Take by mouth.   fluticasone (FLONASE) 50 MCG/ACT nasal spray INSTILL 1 SPRAY IN EACH NOSTRIL  TWICE A DAY FOR DIZZINESS   folic acid (FOLVITE) 1 MG tablet Take 1 mg by mouth daily.   Ginger, Zingiber officinalis, (GINGER PO) Mixes as a powder for back pain (ginger, cinnamon, black pepper, turmeric)   hydrochlorothiazide (HYDRODIURIL) 25 MG tablet    methocarbamol (ROBAXIN) 500 MG tablet TAKE ONE TABLET BY MOUTH AFTER SUPPER -- TAKE AFTER EVENING MEAL WHEN NEEDED.  NO DRIVING FOR 8 HOURS. FOR MUSCLE PAIN/SPASMS   mirtazapine (REMERON) 7.5 MG tablet Take 1 tablet (7.5 mg total) by mouth at  bedtime.   mometasone-formoterol (DULERA) 200-5 MCG/ACT AERO Inhale 2 puffs into the lungs in the morning and at bedtime.   montelukast (SINGULAIR) 10 MG tablet Take 1 tablet (10 mg total) by mouth at bedtime.   Multiple Vitamin (QUINTABS) TABS Take by mouth.   naproxen (NAPROSYN) 500 MG tablet    OVER THE COUNTER MEDICATION NeuroQ daily   oxybutynin (DITROPAN-XL) 10 MG 24 hr tablet Take 10 mg by mouth at bedtime.   pantoprazole (PROTONIX) 40 MG tablet Take by mouth.   sildenafil (VIAGRA) 100 MG tablet    tamsulosin (FLOMAX) 0.4 MG CAPS capsule Take 0.4 mg by mouth.   Tiotropium Bromide Monohydrate 2.5 MCG/ACT AERS Inhale 2 puffs into the lungs daily.   vitamin C (ASCORBIC ACID) 500 MG tablet Take by mouth.   No facility-administered encounter medications on file as of 03/26/2022.    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[place])    PVD (peripheral vascular disease) ([place])    Tremor 06/02/2015   Past Surgical History:  Procedure Laterality Date   TONSILLECTOMY     VASECTOMY     Family History  Problem Relation Age of Onset   Cancer Brother 45       prostate ca   Irregular heart beat Mother    Irregular heart beat Father    Tremor Father    Cancer Sister    Social History   Socioeconomic History   Marital status: Single    Spouse name: Not on file   Number of children: 0   Years of education: 14   Highest education level: Not on file  Occupational History   Occupation: retired  Tobacco Use   Smoking status: Former     Types: Cigarettes    Quit date: 06/09/2005    Years since quitting: 16.8   Smokeless tobacco: Never  Substance and Sexual Activity   Alcohol use: No    Comment: quit 10 years ago   Drug use: No   Sexual activity: Yes  Other Topics Concern   Not on file  Social History Narrative   01/09/22 Lives alone   Patient does not drink caffeine.   Patient is right handed.    Social Determinants of Health   Financial Resource Strain: Low Risk  (03/26/2022)   Overall Financial Resource Strain (CARDIA)    Difficulty of Paying Living Expenses: Not hard at all  Food Insecurity: No Food Insecurity (03/26/2022)   Hunger Vital  Sign    Worried About Charity fundraiser in the Last Year: Never true    [place] in the Last Year: Never true  Transportation Needs: No Transportation Needs (03/26/2022)   PRAPARE - Hydrologist (Medical): No    Lack of Transportation (Non-Medical): No  Physical Activity: Sufficiently Active (03/26/2022)   Exercise Vital Sign    Days of Exercise per Week: 5 days    Minutes of Exercise per Session: 30 min  Stress: No Stress Concern Present (03/26/2022)   [place]    Feeling of Stress : Not at all  Social Connections: Moderately Integrated (03/26/2022)   Social Connection and Isolation Panel [NHANES]    Frequency of Communication with Friends and Family: More than three times a week    Frequency of Social Gatherings with Friends and Family: Once a week    Attends Religious Services: More than 4 times per year    Active Member of [employer] or Organizations: Not on file    Attends Music therapist: More than 4 times per year    Marital Status: Divorced    Tobacco Counseling Counseling given: Not Answered   Clinical Intake:  Pre-visit preparation completed: Yes  Pain : No/denies pain     Nutritional Risks: None  How often do you need to have someone  help you when you read instructions, pamphlets, or other written materials from your doctor or pharmacy?: 1 - Never What is the last grade level you completed in school?: 17 years in the [place]  Diabetic? no  Interpreter Needed?: No  Information entered by :: Lisette Abu, LPN.   Activities of Daily Living    03/26/2022    4:23 PM  In your present state of health, do you have any difficulty performing the following activities:  Hearing? 0  Vision? 0  Difficulty concentrating or making decisions? 0  Walking or climbing stairs? 0  Dressing or bathing? 0  Doing errands, shopping? 0  Preparing Food and eating ? N  Using the Toilet? N  In the past six months, have you accidently leaked urine? N  Do you have problems with loss of bowel control? N  Managing your Medications? N  Managing your Finances? N  Housekeeping or managing your Housekeeping? N    Patient Care Team: [name], [name] Lacks, MD as PCP - General (Internal Medicine) Armour, Azalia Bilis, MD as Referring Physician (Internal Medicine) Rush Farmer, MD (Pulmonary Disease) Joelene Millin PA-C as Physician Assistant (Orthopedic Surgery) Webb Laws, [place] as Referring Physician (Optometry) Szabat, Darnelle Maffucci, [facility] (Inactive) as Pharmacist (Pharmacist) Kathrynn Ducking, MD (Inactive) as Consulting Physician (Neurology) Melina Schools, MD as Consulting Physician (Orthopedic Surgery) Penni Bombard, MD as Consulting Physician (Neurology)  Indicate any recent Medical Services you may have received from other than [facility] providers in the past year (date may be approximate).     Assessment:   This is a routine wellness examination for Stephens.  Hearing/Vision screen Hearing Screening - Comments:: Patient denied any hearing difficulty.   No hearing aids.  Vision Screening - Comments:: Patient does wear corrective lenses/contacts.  Eye exam done by: Howard McFarland, OD.   Dietary issues and  exercise activities discussed: Current Exercise Habits: Home exercise routine, Type of exercise: Other - see comments (home exercises), Time (Minutes): 30, Frequency (Times/Week): 5, Weekly Exercise (Minutes/Week): 150, Intensity: Mild, Exercise limited by: respiratory conditions(s);neurologic condition(s)  Goals Addressed             This Visit's Progress    Patient declined health goal at this time.        Depression Screen    03/26/2022    4:08 PM 02/28/2022    9:35 AM 11/27/2021   10:16 AM 03/23/2021    3:53 PM 02/01/2021    2:35 PM 09/21/2019    9:31 AM 09/21/2019    9:30 AM  PHQ 2/9 Scores  PHQ - 2 Score 0 0 2 0 0 0 0  PHQ- 9 Score 0 0 2 0 0 0     Fall Risk    03/26/2022    4:08 PM 01/09/2022   11:10 AM 11/27/2021   10:15 AM 07/05/2021   10:55 AM 03/23/2021    3:49 PM  [place] in the past year? 0 0 0 0 1  Number falls in past yr: 0  0  1  Injury with Fall? 0  0  0  Risk for fall due to : No Fall Risks  Impaired balance/gait  History of fall(s);Impaired mobility;Impaired balance/gait;Orthopedic patient  Follow up Falls evaluation completed  Falls evaluation completed  Falls evaluation completed    [place]:  Any stairs in or around the home? No  If so, are there any without handrails? No  Home free of loose throw rugs in walkways, pet beds, electrical cords, etc? Yes  Adequate lighting in your home to reduce risk of falls? Yes   ASSISTIVE DEVICES UTILIZED TO PREVENT FALLS:  Life alert? No  Use of a cane, walker or w/c? No  Grab bars in the bathroom? Yes  Shower chair or bench in shower? Yes  Elevated toilet seat or a handicapped toilet? Yes   TIMED UP AND GO:  Was the test performed? No .  Length of time to ambulate 10 feet: n/a sec.   Appearance of gait: Gait not evaluated during this visit.  Cognitive Function:        03/26/2022    4:16 PM  6CIT Screen  What Year? 0 points  What month? 0 points  What time? 0  points  Count back from 20 0 points  Months in reverse 0 points  Repeat phrase 0 points  Total Score 0 points    Immunizations Immunization History  Administered Date(s) Administered   Fluad Quad(high Dose 65+) 04/29/2020, 05/10/2020, 05/02/2021   Influenza Split 05/31/2015   Influenza Whole 07/25/2007   Influenza, High Dose Seasonal PF 03/30/2013, 04/15/2013, 04/29/2017, 04/23/2018, 04/27/2019, 05/21/2019   Influenza,inj,Quad PF,6+ Mos 05/28/2016   Influenza-Unspecified 05/31/2007, 06/18/2008, 05/16/2009, 04/11/2010, 04/29/2010, 07/27/2011, 05/15/2012, 05/12/2014   Moderna Covid-19 Vaccine Bivalent Booster 63yr & up 07/26/2021   Moderna Sars-Covid-2 Vaccination 08/25/2019, 09/22/2019, 06/24/2020, 12/08/2020   Pneumococcal Conjugate-13 06/29/2014   Pneumococcal Polysaccharide-23 05/28/2013   Pneumococcal-Unspecified 05/06/2007   Rabies, IM 04/20/2020   Td,absorbed, Preservative Free, Adult Use, Lf Unspecified 04/17/2021   Tdap 09/07/2011   Zoster Recombinat (Shingrix) 12/11/2019, 02/16/2020    TDAP status: Due, Education has been provided regarding the importance of this vaccine. Advised may receive this vaccine at local pharmacy or Health Dept. Aware to provide a copy of the vaccination record if obtained from local pharmacy or Health Dept. Verbalized acceptance and understanding.  Flu Vaccine status: Due, Education has been provided regarding the importance of this vaccine. Advised may receive this vaccine at local pharmacy or Health Dept. Aware to provide a  copy of the vaccination record if obtained from local pharmacy or Health Dept. Verbalized acceptance and understanding.  Pneumococcal vaccine status: Up to date  Covid-19 vaccine status: Completed vaccines  Qualifies for Shingles Vaccine? Yes   Zostavax completed No   Shingrix Completed?: Yes  Screening Tests Health Maintenance  Topic Date Due   Hepatitis C Screening  Never done   COLONOSCOPY (Pts 45-48yr Insurance  coverage will need to be confirmed)  07/22/2012   TETANUS/TDAP  09/06/2021   COVID-19 Vaccine (6 - Moderna risk series) 09/20/2021   INFLUENZA VACCINE  02/27/2022   Pneumonia Vaccine 75 Years old  Completed   Zoster Vaccines- Shingrix  Completed   HPV VACCINES  Aged Out    Health Maintenance  Health Maintenance Due  Topic Date Due   Hepatitis C Screening  Never done   COLONOSCOPY (Pts 45-467yrInsurance coverage will need to be confirmed)  07/22/2012   TETANUS/TDAP  09/06/2021   COVID-19 Vaccine (6 - Moderna risk series) 09/20/2021   INFLUENZA VACCINE  02/27/2022    Colorectal cancer screening: Type of screening: [facility]. [facility]  Lung Cancer Screening: (Low Dose CT Chest recommended if Age 75-80ears, 30 pack-year currently smoking OR have quit w/in 15years.) does not qualify.   Lung Cancer Screening Referral: no; already a patient.  Additional Screening:  Hepatitis C Screening: does qualify; Completed no  Vision Screening: Recommended annual ophthalmology exams for early detection of glaucoma and other disorders of the eye. Is the patient up to date with their annual eye exam?  Yes  Who is the provider or what is the name of the office in which the patient attends annual eye exams? Howard McFarland, OD and [facility] If pt is not established with a provider, would they like to be referred to a provider to establish care? No .   Dental Screening: Recommended annual dental exams for proper oral hygiene  Community Resource Referral / Chronic Care Management: CRR required this visit?  No   CCM required this visit?  No      Plan:     I have personally reviewed and noted the following in the patient's chart:   Medical and social history Use of alcohol, tobacco or illicit drugs  Current medications and supplements including opioid prescriptions. Patient is not currently taking opioid  prescriptions. Functional ability and status Nutritional status Physical activity Advanced directives List of other physicians Hospitalizations, surgeries, and ER visits in previous 12 months Vitals Screenings to include cognitive, depression, and falls Referrals and appointments  In addition, I have reviewed and discussed with patient certain preventive protocols, quality metrics, and best practice recommendations. A written personalized care plan for preventive services as well as general preventive health recommendations were provided to patient.     ShSheral FlowLPN   02/28/61/0355 Nurse Notes:  Patient is cogitatively intact. There were no vitals filed for this visit. There is no height or weight on file to calculate BMI.   Medical screening examination/treatment/procedure(s) were performed by non-physician practitioner and as supervising physician I was immediately available for consultation/collaboration.  I agree with above. AlLew DawesMD

## 2022-03-26 NOTE — Patient Instructions (Signed)
Lee [name] , Thank you for taking time to come for your Medicare Wellness Visit. I appreciate your ongoing commitment to your health goals. Please review the following plan we discussed and let me know if I can assist you in the future.   Screening recommendations/referrals: Colonoscopy: done at [facility] Recommended yearly ophthalmology/optometry visit for glaucoma screening and checkup Recommended yearly dental visit for hygiene and checkup  Vaccinations: Influenza vaccine: 05/02/2021; due Fall 2023 Pneumococcal vaccine: 06/29/2014, 05/28/2013 Tdap vaccine: 09/07/2011; due every 10 years (overdue) Shingles vaccine: 12/11/2019, 02/16/2020   Covid-19: 08/25/2019, 09/22/2019, 11//26/2021, 12/08/2020, 07/26/2021  Advanced directives: Yes; Please bring a copy of your health care power of attorney and living will to the office at your convenience.  Conditions/risks identified: Yes  Next appointment: Please schedule your next Medicare Wellness Visit with your Nurse Health Advisor in 1 year by calling [phone].  Preventive Care 75 Years and Older, Male Preventive care refers to lifestyle choices and visits with your health care provider that can promote health and wellness. What does preventive care include? A yearly physical exam. This is also called an annual well check. Dental exams once or twice a year. Routine eye exams. Ask your health care provider how often you should have your eyes checked. Personal lifestyle choices, including: Daily care of your teeth and gums. Regular physical activity. Eating a healthy diet. Avoiding tobacco and drug use. Limiting alcohol use. Practicing safe sex. Taking low doses of aspirin every day. Taking vitamin and mineral supplements as recommended by your health care provider. What happens during an annual well check? The services and screenings done by your health care provider during your annual well check will depend on your age, overall  health, lifestyle risk factors, and family history of disease. Counseling  Your health care provider may ask you questions about your: Alcohol use. Tobacco use. Drug use. Emotional well-being. Home and relationship well-being. Sexual activity. Eating habits. History of falls. Memory and ability to understand (cognition). Work and work Statistician. Screening  You may have the following tests or measurements: Height, weight, and BMI. Blood pressure. Lipid and cholesterol levels. These may be checked every 5 years, or more frequently if you are over 66 years old. Skin check. Lung cancer screening. You may have this screening every year starting at age 68 if you have a 30-pack-year history of smoking and currently smoke or have quit within the past 15 years. Fecal occult blood test (FOBT) of the stool. You may have this test every year starting at age 61. Flexible sigmoidoscopy or colonoscopy. You may have a sigmoidoscopy every 5 years or a colonoscopy every 10 years starting at age 86. Prostate cancer screening. Recommendations will vary depending on your family history and other risks. Hepatitis C blood test. Hepatitis B blood test. Sexually transmitted disease (STD) testing. Diabetes screening. This is done by checking your blood sugar (glucose) after you have not eaten for a while (fasting). You may have this done every 1-3 years. Abdominal aortic aneurysm (AAA) screening. You may need this if you are a current or former smoker. Osteoporosis. You may be screened starting at age 90 if you are at high risk. Talk with your health care provider about your test results, treatment options, and if necessary, the need for more tests. Vaccines  Your health care provider may recommend certain vaccines, such as: Influenza vaccine. This is recommended every year. Tetanus, diphtheria, and acellular pertussis (Tdap, Td) vaccine. You may need a Td booster every 10 years. Zoster  vaccine. You may  need this after age 100. Pneumococcal 13-valent conjugate (PCV13) vaccine. One dose is recommended after age 75. Pneumococcal polysaccharide (PPSV23) vaccine. One dose is recommended after age 75. Talk to your health care provider about which screenings and vaccines you need and how often you need them. This information is not intended to replace advice given to you by your health care provider. Make sure you discuss any questions you have with your health care provider. Document Released: 08/12/2015 Document Revised: 04/04/2016 Document Reviewed: 05/17/2015 Elsevier Interactive Patient Education  2017 [place] Prevention in the Home Falls can cause injuries. They can happen to people of all ages. There are many things you can do to make your home safe and to help prevent falls. What can I do on the outside of my home? Regularly fix the edges of walkways and driveways and fix any cracks. Remove anything that might make you trip as you walk through a door, such as a raised step or threshold. Trim any bushes or trees on the path to your home. Use bright outdoor lighting. Clear any walking paths of anything that might make someone trip, such as rocks or tools. Regularly check to see if handrails are loose or broken. Make sure that both sides of any steps have handrails. Any raised decks and porches should have guardrails on the edges. Have any leaves, snow, or ice cleared regularly. Use sand or salt on walking paths during winter. Clean up any spills in your garage right away. This includes oil or grease spills. What can I do in the bathroom? Use night lights. Install grab bars by the toilet and in the tub and shower. Do not use towel bars as grab bars. Use non-skid mats or decals in the tub or shower. If you need to sit down in the shower, use a plastic, non-slip stool. Keep the floor dry. Clean up any water that spills on the floor as soon as it happens. Remove soap buildup in  the tub or shower regularly. Attach bath mats securely with double-sided non-slip rug tape. Do not have throw rugs and other things on the floor that can make you trip. What can I do in the bedroom? Use night lights. Make sure that you have a light by your bed that is easy to reach. Do not use any sheets or blankets that are too big for your bed. They should not hang down onto the floor. Have a firm chair that has side arms. You can use this for support while you get dressed. Do not have throw rugs and other things on the floor that can make you trip. What can I do in the kitchen? Clean up any spills right away. Avoid walking on wet floors. Keep items that you use a lot in easy-to-reach places. If you need to reach something above you, use a strong step stool that has a grab bar. Keep electrical cords out of the way. Do not use floor polish or wax that makes floors slippery. If you must use wax, use non-skid floor wax. Do not have throw rugs and other things on the floor that can make you trip. What can I do with my stairs? Do not leave any items on the stairs. Make sure that there are handrails on both sides of the stairs and use them. Fix handrails that are broken or loose. Make sure that handrails are as long as the stairways. Check any carpeting to make sure that it  is firmly attached to the stairs. Fix any carpet that is loose or worn. Avoid having throw rugs at the top or bottom of the stairs. If you do have throw rugs, attach them to the floor with carpet tape. Make sure that you have a light switch at the top of the stairs and the bottom of the stairs. If you do not have them, ask someone to add them for you. What else can I do to help prevent falls? Wear shoes that: Do not have high heels. Have rubber bottoms. Are comfortable and fit you well. Are closed at the toe. Do not wear sandals. If you use a stepladder: Make sure that it is fully opened. Do not climb a closed  stepladder. Make sure that both sides of the stepladder are locked into place. Ask someone to hold it for you, if possible. Clearly mark and make sure that you can see: Any grab bars or handrails. First and last steps. Where the edge of each step is. Use tools that help you move around (mobility aids) if they are needed. These include: Canes. Walkers. Scooters. Crutches. Turn on the lights when you go into a dark area. Replace any light bulbs as soon as they burn out. Set up your furniture so you have a clear path. Avoid moving your furniture around. If any of your floors are uneven, fix them. If there are any pets around you, be aware of where they are. Review your medicines with your doctor. Some medicines can make you feel dizzy. This can increase your chance of falling. Ask your doctor what other things that you can do to help prevent falls. This information is not intended to replace advice given to you by your health care provider. Make sure you discuss any questions you have with your health care provider. Document Released: 05/12/2009 Document Revised: 12/22/2015 Document Reviewed: 08/20/2014 Elsevier Interactive Patient Education  2017 [employer].

## 2022-03-26 NOTE — Therapy (Signed)
OUTPATIENT PHYSICAL THERAPY NEURO TREATMENT   Patient Name: Lee [name] MRN: [id number] DOB:Feb 01, 1946, 76 y.o., male Today's Date: 03/26/2022   PCP: Cassandria Anger, MD REFERRING PROVIDER: Harvie Heck, MD    PT End of Session - 03/26/22 1407     Visit Number 9    Number of Visits 16    Date for PT Re-Evaluation 05/22/22    Authorization Type [facility] - 15 visits approved from 12/29/21 - 04/28/22    Authorization - Visit Number 8    Authorization - Number of Visits 15    PT Start Time 1406    PT Stop Time 1445    PT Time Calculation (min) 39 min    Activity Tolerance Patient limited by lethargy    Behavior During Therapy Flat affect;WFL for tasks assessed/performed                Past Medical History:  Diagnosis Date   Acid reflux    Alcohol abuse    quit 10 y ago   Arthritis    BPH (benign prostatic hyperplasia)    CAD (coronary artery disease)    Cervical spondylosis without myelopathy 06/02/2015   COPD (chronic obstructive pulmonary disease) ([place])    Emphysema lung ([place])    GERD (gastroesophageal reflux disease)    High cholesterol    Hypertension    Parkinson's disease ([place])    PVD (peripheral vascular disease) ([place])    Tremor 06/02/2015   Past Surgical History:  Procedure Laterality Date   TONSILLECTOMY     VASECTOMY     Patient Active Problem List   Diagnosis Date Noted   Leukopenia 02/28/2022   Acquired deformities of toe(s), unspecified, unspecified foot 11/27/2021   Asthma 11/27/2021   Cervicalgia 11/27/2021   Corns and callosities 11/27/2021   Disturbance of skin sensation 11/27/2021   Elevated prostate specific antigen (PSA) 11/27/2021   Excessive attrition of teeth 11/27/2021   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   Family history of cancer 11/27/2021   Family history of malignant neoplasm of prostate 11/27/2021   Hyperlipidemia 11/27/2021   Iron deficiency anemia, unspecified 11/27/2021   Moderate recurrent major  depression ([place]) 11/27/2021   Myalgia 11/27/2021   Nail dystrophy 11/27/2021   Benign neoplasm of other parts of mouth 11/27/2021   Open angle with borderline findings, high risk, left eye 11/27/2021   Other abnormal glucose 11/27/2021   Other reduced mobility 11/27/2021   Other secondary cataract, bilateral 11/27/2021   Pain in right thigh 11/27/2021   Pain in right knee 11/27/2021   Pain in unspecified shoulder 11/27/2021   Persons encountering health services in other specified circumstances 11/27/2021   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/27/2021   Right lower quadrant pain 11/27/2021   Tinnitus 11/27/2021   Trochanteric bursitis, right hip 11/27/2021   Unilateral primary osteoarthritis, right hip 11/27/2021   Unspecified fall, initial encounter 11/27/2021   Vitamin D deficiency, unspecified 11/27/2021   Weakness 11/27/2021   Constipation 08/28/2021   Chronic obstructive pulmonary disease ([place]) 05/31/2021   Depression 04/13/2021   Parkinson's disease ([place]) 02/28/2021   Weakness of both legs 02/01/2021   Dysphagia 02/01/2021   Weakness of right leg 01/06/2021   Lumbar radiculopathy 12/30/2020   Prostate cancer ([place]) 06/09/2020   Well adult exam 06/04/2019   BPH (benign prostatic hyperplasia) 06/04/2019   Bilateral carotid bruits 06/04/2019   Meniere disease, 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   Emphysema of lung ([place]) 06/04/2019  Spondylolisthesis at L4-L5 level 08/27/2017   Spinal stenosis in cervical region 08/27/2017   Tremor 06/02/2015   Cervical spondylosis without myelopathy 06/02/2015   Alcoholism in remission ([place]) 06/09/2014   Coronary arteriosclerosis 07/30/2009   History of colonoscopy 07/30/2009   Neutropenia ([place]) 07/30/2008   HIP PAIN 11/27/2007   Essential hypertension 07/25/2007   Atrial fibrillation ([place]) 07/25/2007   GERD 07/25/2007   ERECTILE DYSFUNCTION 07/25/2007   LOW BACK PAIN  07/25/2007    ONSET DATE: 12/29/2021   REFERRING DIAG: G20 (ICD-10-CM) - Parkinson disease ([place])   THERAPY DIAG:  Unsteadiness on feet  Muscle weakness (generalized)  Other abnormalities of gait and mobility  Rationale for Evaluation and Treatment Rehabilitation  SUBJECTIVE:                                                                                                                                                                                              SUBJECTIVE STATEMENT: Pt reports some almost falls, but is able to catch himself. Pt reports that he is taking carbidopa levodopa daily 3x a day.   Pt accompanied by: self  PERTINENT HISTORY: PMH:  neck and back pain, PD -dx in March 2022 per pt report, hx of ETOH abuse, CAD, COPD, emphysema, GERD, HTN, high cholesterol, PVD, pt with hx of being non-compliant with meds  PAIN:  Are you having pain? Yes: NPRS scale: 2-3/10 Pain location: RLE, distal to knee Pain description: Dull Aggravating factors: Depends how his body is like, pressure Relieving factors: Sitting up taller, tumeric  PRECAUTIONS: Fall  VITALS There were no vitals filed for this visit.   FALLS: Has patient fallen in last 6 months?  No falls to the ground, but stumbles all the time, can't keep count, normally can catch himself.  Had fall on 7/31 in home - first fall in which he was not able to catch himself   PLOF: Independent with community mobility with device  PATIENT GOALS Wants to get rid of the PD and go back to where he was 2-3 years ago. Get back to walking in the park.    OBJECTIVE:   TODAY'S TREATMENT:   Pt performs PWR! Moves in standing position on air ex for compliant surface. Chair in front of pt as needed for balance.    PWR! Up for improved posture x10 reps   PWR! Rock for improved weightshifting x10 reps  PWR! Twist for improved trunk rotation x10 reps each direction.   PWR! Step for improved step initiation x10 reps each  side, cues for incr foot clearance when stepping, pt needing intermittent UE support for balance.  Pt with postural sway on air ex, but no LOB.    At countertop retro gait down and back x3 reps, pt initially taking 12 steps, cues for incr step length, pt then able to perform in 12 steps and then 9 steps. Cued to also slow pace down. When turning at [place], cued for marching for improved foot clearance when turning as pt takes very small/shuffled steps and is almost festinating.  At rebounder; alternating forward step and tossing ball into rebounder and then catching it x10 reps each side, progressing difficulty by adding in dual tasking with pt having to name different animals. Cues for foot clearance. Pt needing cues to not repeat animals.   Gait pattern: step through pattern, decreased arm swing- Left, decreased step length- Right, decreased step length- Left, decreased stride length, festinating, and trunk flexed Distance walked: 500' with rollator and various clinic distances with cane.  Assistive device utilized: Single point cane and Walker - 4 wheeled Level of assistance: SBA Comments: Trialed rollator (pt in agreement to at least try it but was very hesitant at first), cues for posture, keeping feet under the seat/staying closer. Pt demonstrating improved stride length/foot clearance and no episodes of freezing/festination throughout trial. Asked if pt wanted to try it outside, but pt not interested today. Educated on purpose and benefits of using a rollator due to balance deficits, hx of falls/almost falls and freezing episodes. Pt reporting that he likes the rollator and felt good with it, but does not want to use it at this time. PT going over ways to obtain one (through the [place], purchasing one from [employer]). Pt requesting PT writing the name of this rollator down so that he can know what it is when he goes to the [place].   PATIENT EDUCATION: Education details: Continue HEP, see gait section  above.  Person educated: Patient Education method: Customer service manager Education comprehension: verbalized understanding, returned demonstration, and needs further education   HOME EXERCISE PROGRAM: [email or web address], Seated and Standing PWR moves   Access Code: [email or web address] URL: [email or web address] Date: 03/05/2022 Prepared by: Mickie Bail Plaster  Exercises - Alternating Step Backward with Support  - 1 x daily - 5 x weekly - 1 sets - 10 reps - Alternating Step Forward with Support  - 1 x daily - 5 x weekly - 1 sets - 10 reps - Step Sideways  - 1 x daily - 5 x weekly - 1 sets - 10 reps - Standing Quarter Turn with Counter Support  - 1 x daily - 5 x weekly - 1 sets - 5 reps - Sit to Stand Without Arm Support  - 1 x daily - 7 x weekly - 3 sets - 10 reps    GOALS: Goals reviewed with patient? Yes  SHORT TERM GOALS: Target date: 03/21/2022  Pt will be independent with initial HEP for improved strength, balance, transfers, and gait.  Baseline: Pt not performing regularly  Goal status: IN PROGRESS  2.  Pt will verbalize tips to reduce festination/freezing with gait and turns.  Baseline: Pt unable to teach therapist tips when asked. However, when therapist attempts to educate pt, pt claims he is already integrating tips and does not need education  Goal status: IN PROGRESS  3.  Pt will verbalize understanding of local Parkinson's disease resources.  Baseline:  Goal status: IN PROGRESS  4.  Push and release test to be assessed with LTG written for improved balance recovery. Baseline: Anterior = 4-5 small steps Posterior = 5-6 small  steps, min A for steadying Lateral = unable to elicit a step in either direction.  Goal status: MET  5.   Pt will improve 5x sit<>stand with BUE support to less than or equal to 22 sec to demonstrate improved functional strength and transfer efficiency.  Baseline: 26.57 seconds with BUE support; 13.85s without UE support   Goal status:  MET    LONG TERM GOALS: Target date: 04/18/2022  Pt will be independent with final HEP for improved strength, balance, transfers, and gait.  Baseline:  Goal status: INITIAL  2.   Pt will improve gait velocity to at least 2.4 ft/sec with LRAD for improved gait efficiency and safety.   Baseline: 14.96 seconds with SPC = 2.19 ft/sec Goal status: INITIAL  3.   Pt will ambulate at least 500' ft indoor and outdoor surfaces, appropriate assistive device with supervision for imrpoved community gait.   Baseline:  Goal status: INITIAL  4.  Pt will recover posterior and anterior balance in push and release test in 2-3 or less steps independently, for improved balance recovery.   Baseline: Anterior = 4-5 small steps Posterior = 5-6 small steps, min A for steadying Goal status: INITIAL  5.  Pt will improve 5x sit<>stand with BUE support to less than or equal to 11.5 sec to demonstrate improved functional strength and transfer efficiency. Baseline: 26.57 seconds with BUE support; 13.85s on 8/23  Goal status: REVISED  6.  Pt will improve cog TUG to 17 seconds or less in order to demo improved dual tasking/decr fall risk.  Baseline: 22.38 sec with SPC  Goal status: INITIAL    ASSESSMENT:  CLINICAL IMPRESSION: Trialed rollator today for gait on level surfaces (pt initially very reluctant) with pt demonstrating improved stride length/foot clearance and had no episodes of freezing. Pt reports that he liked it, but does not want to go forward with it at this time. Remainder of session focused on balance on compliant surfaces, stepping strategies, and retro gait. Pt tolerated session well, will continue per POC.   OBJECTIVE IMPAIRMENTS Abnormal gait, decreased activity tolerance, decreased balance, decreased cognition, decreased coordination, decreased endurance, decreased knowledge of use of DME, difficulty walking, decreased strength, decreased safety awareness, impaired flexibility, impaired  sensation, impaired tone, postural dysfunction, and pain.   ACTIVITY LIMITATIONS stairs, transfers, bed mobility, and locomotion level  PARTICIPATION LIMITATIONS: cleaning and community activity  PERSONAL FACTORS Behavior pattern, Past/current experiences, Time since onset of injury/illness/exacerbation, and 3+ comorbidities: neck and back pain, PD -dx in March 2022 per pt report, hx of ETOH abuse, CAD, COPD, emphysema, GERD, HTN, high cholesterol, PVD  are also affecting patient's functional outcome.   REHAB POTENTIAL: Good  CLINICAL DECISION MAKING: Evolving/moderate complexity  EVALUATION COMPLEXITY: Moderate  PLAN: PT FREQUENCY: 2x/week  PT DURATION: 12 weeks  PLANNED INTERVENTIONS: Therapeutic exercises, Therapeutic activity, Neuromuscular re-education, Balance training, Gait training, Patient/Family education, Self Care, Stair training, DME instructions, and Re-evaluation  PLAN FOR NEXT SESSION: Did pt take medication? Pt has treadmill at home and wants to try it in clinic (unsure about this one). Stepping strategies for balance, weight shifting/SLS, continued education on techniques for freezing/festination episodes. [place]one taps on unlevel surfaces, rebounder drills, high amplitude, movement    Arliss Journey, PT, DPT  03/26/2022, 2:48 PM

## 2022-03-28 ENCOUNTER — Ambulatory Visit: Payer: No Typology Code available for payment source | Admitting: Physical Therapy

## 2022-03-28 ENCOUNTER — Encounter: Payer: Self-pay | Admitting: Physical Therapy

## 2022-03-28 ENCOUNTER — Ambulatory Visit: Payer: No Typology Code available for payment source | Admitting: Speech Pathology

## 2022-03-28 ENCOUNTER — Ambulatory Visit: Payer: No Typology Code available for payment source | Admitting: Occupational Therapy

## 2022-03-28 VITALS — BP 131/72 | HR 61

## 2022-03-28 DIAGNOSIS — M6281 Muscle weakness (generalized): Secondary | ICD-10-CM

## 2022-03-28 DIAGNOSIS — R251 Tremor, unspecified: Secondary | ICD-10-CM

## 2022-03-28 DIAGNOSIS — R471 Dysarthria and anarthria: Secondary | ICD-10-CM

## 2022-03-28 DIAGNOSIS — R293 Abnormal posture: Secondary | ICD-10-CM

## 2022-03-28 DIAGNOSIS — R29818 Other symptoms and signs involving the nervous system: Secondary | ICD-10-CM

## 2022-03-28 DIAGNOSIS — R2681 Unsteadiness on feet: Secondary | ICD-10-CM | POA: Diagnosis not present

## 2022-03-28 DIAGNOSIS — R29898 Other symptoms and signs involving the musculoskeletal system: Secondary | ICD-10-CM | POA: Diagnosis not present

## 2022-03-28 DIAGNOSIS — R2689 Other abnormalities of gait and mobility: Secondary | ICD-10-CM | POA: Diagnosis not present

## 2022-03-28 DIAGNOSIS — R1312 Dysphagia, oropharyngeal phase: Secondary | ICD-10-CM | POA: Diagnosis not present

## 2022-03-28 DIAGNOSIS — R278 Other lack of coordination: Secondary | ICD-10-CM | POA: Diagnosis not present

## 2022-03-28 DIAGNOSIS — R41844 Frontal lobe and executive function deficit: Secondary | ICD-10-CM

## 2022-03-28 DIAGNOSIS — R41840 Attention and concentration deficit: Secondary | ICD-10-CM

## 2022-03-28 NOTE — Patient Instructions (Signed)
When you stand up, take a couple seconds and rock through your hips before taking a big step!   When you go to turn, focus on going SLOW and picking up feet into a march.

## 2022-03-28 NOTE — Therapy (Signed)
OUTPATIENT PHYSICAL THERAPY NEURO TREATMENT   Patient Name: Lee [name] MRN: [id number] DOB:Aug 31, 1945, 76 y.o., male Today's Date: 03/28/2022   PCP: Cassandria Anger, MD REFERRING PROVIDER: Harvie Heck, MD    PT End of Session - 03/28/22 1017     Visit Number 10    Number of Visits 16    Date for PT Re-Evaluation 05/22/22    Authorization Type [facility] - 15 visits approved from 12/29/21 - 04/28/22    Authorization - Visit Number 9    Authorization - Number of Visits 15    PT Start Time 1015    PT Stop Time 1057    PT Time Calculation (min) 42 min    Equipment Utilized During Treatment Gait belt    Activity Tolerance Patient tolerated treatment well    Behavior During Therapy WFL for tasks assessed/performed                Past Medical History:  Diagnosis Date   Acid reflux    Alcohol abuse    quit 10 y ago   Arthritis    BPH (benign prostatic hyperplasia)    CAD (coronary artery disease)    Cervical spondylosis without myelopathy 06/02/2015   COPD (chronic obstructive pulmonary disease) ([place])    Emphysema lung ([place])    GERD (gastroesophageal reflux disease)    High cholesterol    Hypertension    Parkinson's disease ([place])    PVD (peripheral vascular disease) ([place])    Tremor 06/02/2015   Past Surgical History:  Procedure Laterality Date   TONSILLECTOMY     VASECTOMY     Patient Active Problem List   Diagnosis Date Noted   Leukopenia 02/28/2022   Acquired deformities of toe(s), unspecified, unspecified foot 11/27/2021   Asthma 11/27/2021   Cervicalgia 11/27/2021   Corns and callosities 11/27/2021   Disturbance of skin sensation 11/27/2021   Elevated prostate specific antigen (PSA) 11/27/2021   Excessive attrition of teeth 11/27/2021   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   Family history of cancer 11/27/2021   Family history of malignant neoplasm of prostate 11/27/2021   Hyperlipidemia 11/27/2021   Iron deficiency anemia, unspecified  11/27/2021   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[place]) 11/27/2021   Myalgia 11/27/2021   Nail dystrophy 11/27/2021   Benign neoplasm of other parts of mouth 11/27/2021   Open angle with borderline findings, high risk, left eye 11/27/2021   Other abnormal glucose 11/27/2021   Other reduced mobility 11/27/2021   Other secondary cataract, bilateral 11/27/2021   Pain in right thigh 11/27/2021   Pain in right knee 11/27/2021   Pain in unspecified shoulder 11/27/2021   Persons encountering health services in other specified circumstances 11/27/2021   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/27/2021   Right lower quadrant pain 11/27/2021   Tinnitus 11/27/2021   Trochanteric bursitis, right hip 11/27/2021   Unilateral primary osteoarthritis, right hip 11/27/2021   Unspecified fall, initial encounter 11/27/2021   Vitamin D deficiency, unspecified 11/27/2021   Weakness 11/27/2021   Constipation 08/28/2021   Chronic obstructive pulmonary disease ([place]) 05/31/2021   Depression 04/13/2021   Parkinson's disease ([place]) 02/28/2021   Weakness of both legs 02/01/2021   Dysphagia 02/01/2021   Weakness of right leg 01/06/2021   Lumbar radiculopathy 12/30/2020   Prostate cancer ([place]) 06/09/2020   Well adult exam 06/04/2019   BPH (benign prostatic hyperplasia) 06/04/2019   Bilateral carotid bruits 06/04/2019   Meniere disease, 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  Emphysema of lung ([place]) 06/04/2019   Spondylolisthesis at L4-L5 level 08/27/2017   Spinal stenosis in cervical region 08/27/2017   Tremor 06/02/2015   Cervical spondylosis without myelopathy 06/02/2015   Alcoholism in remission ([place]) 06/09/2014   Coronary arteriosclerosis 07/30/2009   History of colonoscopy 07/30/2009   Neutropenia ([place]) 07/30/2008   HIP PAIN 11/27/2007   Essential hypertension 07/25/2007   Atrial fibrillation ([place]) 07/25/2007   GERD 07/25/2007   ERECTILE  DYSFUNCTION 07/25/2007   LOW BACK PAIN 07/25/2007    ONSET DATE: 12/29/2021   REFERRING DIAG: G20 (ICD-10-CM) - Parkinson disease ([place])   THERAPY DIAG:  Other abnormalities of gait and mobility  Muscle weakness (generalized)  Other symptoms and signs involving the nervous system  Rationale for Evaluation and Treatment Rehabilitation  SUBJECTIVE:                                                                                                                                                                                              SUBJECTIVE STATEMENT: Has had trouble doing his exercises at home due to having so many appts. Reports his head was spinning with OT appt this morning. Reports feeling better now.   Pt accompanied by: self  PERTINENT HISTORY: PMH:  neck and back pain, PD -dx in March 2022 per pt report, hx of ETOH abuse, CAD, COPD, emphysema, GERD, HTN, high cholesterol, PVD, pt with hx of being non-compliant with meds  PAIN:  Are you having pain? Yes: NPRS scale: 2-3/10 Pain location: RLE, distal to knee Pain description: Dull Aggravating factors: Depends how his body is like, pressure Relieving factors: Sitting up taller, tumeric  PRECAUTIONS: Fall  VITALS Vitals:   03/28/22 1022  BP: 131/72  Pulse: 61     FALLS: Has patient fallen in last 6 months?  No falls to the ground, but stumbles all the time, can't keep count, normally can catch himself.  Had fall on 7/31 in home - first fall in which he was not able to catch himself   PLOF: Independent with community mobility with device  PATIENT GOALS Wants to get rid of the PD and go back to where he was 2-3 years ago. Get back to walking in the park.    OBJECTIVE:   TODAY'S TREATMENT:  NMR:  Continued to educate on purpose of freezing/festination techniques for improved movement and to help decr risk of falls as pt tends to have freezing/festination with turning and when going to initiate gait.  TUG  Shuttle: With 2 chairs approx. 10' away from each other, performed sit > stand and rocking to help initiate gait. Pt  needing verbal/demo cues for rocking technique and purpose of weight shifting before going to take a step as pt with tendency to have shuffling/festination. Also discussed importance of rocking and waiting a second due to pt sometimes having low BP. Performed walking with incr step length/foot clearance and then performing a marching turn for improved foot clearance vs. Pivoting on feet. Performed x8 reps total, with pt able to rock in standing without cues and did improve with marching turns with incr reps and pt reporting it felt easier and he felt more balanced. Pt demonstrated good carryover of these techniques throughout remainder of session.   Pt asking to have this information printed out for a reminder for home.   At [employer] forward/retro with focus on slowed pace for SLS and holding for 3 seconds at a time (giving pt visual cue on how high to lift up leg), performed down and back x3 reps. Pt more challenged taking steps posteriorly.  SLS: on blue air ex, alternating forward cone taps to 2 cones x10 reps each leg, then progressed to forward tap and cross body tap x10 reps each leg for incr SLS time, weight shifting, coordination, min guard at times.  With wide BOS on blue air ex, trunk rotations x8 reps bilat reaching outside of BOS in different directions, cued to reset with tall posture in midline. Pt did well with this with his balance.   Gait pattern: step through pattern, decreased arm swing- Left, decreased step length- Right, decreased step length- Left, decreased stride length, festinating, and trunk flexed Distance walked: Various clinic distances with cane.  Assistive device utilized: Single point cane Level of assistance: SBA Comments: Working on rocking to initiate gait initially in standing and incr stride length.  PATIENT EDUCATION: Education details:  Continue HEP, freezing/festination strategies and strategies with marching turn. Person educated: Patient Education method: Customer service manager Education comprehension: verbalized understanding, returned demonstration, and needs further education   HOME EXERCISE PROGRAM: [email or web address], Seated and Standing PWR moves   Access Code: [email or web address] URL: [email or web address] Date: 03/05/2022 Prepared by: Mickie Bail Plaster  Exercises - Alternating Step Backward with Support  - 1 x daily - 5 x weekly - 1 sets - 10 reps - Alternating Step Forward with Support  - 1 x daily - 5 x weekly - 1 sets - 10 reps - Step Sideways  - 1 x daily - 5 x weekly - 1 sets - 10 reps - Standing Quarter Turn with Counter Support  - 1 x daily - 5 x weekly - 1 sets - 5 reps - Sit to Stand Without Arm Support  - 1 x daily - 7 x weekly - 3 sets - 10 reps    GOALS: Goals reviewed with patient? Yes  SHORT TERM GOALS: Target date: 03/21/2022  Pt will be independent with initial HEP for improved strength, balance, transfers, and gait.  Baseline: Pt not performing regularly  Goal status: IN PROGRESS  2.  Pt will verbalize tips to reduce festination/freezing with gait and turns.  Baseline: Pt unable to teach therapist tips when asked. However, when therapist attempts to educate pt, pt claims he is already integrating tips and does not need education  Goal status: IN PROGRESS  3.  Pt will verbalize understanding of local Parkinson's disease resources.  Baseline:  Goal status: IN PROGRESS  4.  Push and release test to be assessed with LTG written for improved balance recovery. Baseline: Anterior = 4-5 small steps Posterior = 5-6 small steps, min  A for steadying Lateral = unable to elicit a step in either direction.  Goal status: MET  5.   Pt will improve 5x sit<>stand with BUE support to less than or equal to 22 sec to demonstrate improved functional strength and transfer efficiency.  Baseline: 26.57  seconds with BUE support; 13.85s without UE support   Goal status: MET    LONG TERM GOALS: Target date: 04/18/2022  Pt will be independent with final HEP for improved strength, balance, transfers, and gait.  Baseline:  Goal status: INITIAL  2.   Pt will improve gait velocity to at least 2.4 ft/sec with LRAD for improved gait efficiency and safety.   Baseline: 14.96 seconds with SPC = 2.19 ft/sec Goal status: INITIAL  3.   Pt will ambulate at least 500' ft indoor and outdoor surfaces, appropriate assistive device with supervision for imrpoved community gait.   Baseline:  Goal status: INITIAL  4.  Pt will recover posterior and anterior balance in push and release test in 2-3 or less steps independently, for improved balance recovery.   Baseline: Anterior = 4-5 small steps Posterior = 5-6 small steps, min A for steadying Goal status: INITIAL  5.  Pt will improve 5x sit<>stand with BUE support to less than or equal to 11.5 sec to demonstrate improved functional strength and transfer efficiency. Baseline: 26.57 seconds with BUE support; 13.85s on 8/23  Goal status: REVISED  6.  Pt will improve cog TUG to 17 seconds or less in order to demo improved dual tasking/decr fall risk.  Baseline: 22.38 sec with SPC  Goal status: INITIAL    ASSESSMENT:  CLINICAL IMPRESSION: Today's skilled session focused on reviewing techniques for freezing/festination episodes and marching during turns for improved foot clearance as pt with tendency to festinate. Pt responded well to rocking before initiating a big step and also incr foot clearance when turning. Pt demonstrated good carryover throughout session and verbalized understanding of working on this consistently at home. Remainder of session focused on balance strategies on unlevel surfaces and SLS tasks. Will continue per POC.   OBJECTIVE IMPAIRMENTS Abnormal gait, decreased activity tolerance, decreased balance, decreased cognition, decreased  coordination, decreased endurance, decreased knowledge of use of DME, difficulty walking, decreased strength, decreased safety awareness, impaired flexibility, impaired sensation, impaired tone, postural dysfunction, and pain.   ACTIVITY LIMITATIONS stairs, transfers, bed mobility, and locomotion level  PARTICIPATION LIMITATIONS: cleaning and community activity  PERSONAL FACTORS Behavior pattern, Past/current experiences, Time since onset of injury/illness/exacerbation, and 3+ comorbidities: neck and back pain, PD -dx in March 2022 per pt report, hx of ETOH abuse, CAD, COPD, emphysema, GERD, HTN, high cholesterol, PVD  are also affecting patient's functional outcome.   REHAB POTENTIAL: Good  CLINICAL DECISION MAKING: Evolving/moderate complexity  EVALUATION COMPLEXITY: Moderate  PLAN: PT FREQUENCY: 2x/week  PT DURATION: 12 weeks  PLANNED INTERVENTIONS: Therapeutic exercises, Therapeutic activity, Neuromuscular re-education, Balance training, Gait training, Patient/Family education, Self Care, Stair training, DME instructions, and Re-evaluation  PLAN FOR NEXT SESSION: Did pt take medication?Stepping strategies for balance, weight shifting/SLS, continued education on techniques for freezing/festination episodes. Cone taps on unlevel surfaces, rebounder drills, high amplitude, movement    Arliss Journey, PT, DPT  03/28/2022, 1:14 PM

## 2022-03-28 NOTE — Therapy (Signed)
OUTPATIENT SPEECH LANGUAGE PATHOLOGY TREATMENT NOTE   Patient Name: Lee [name] MRN: [id number] DOB:09/28/1945, 76 y.o., male Today's Date: 03/28/2022  PCP: Dr. Lew Dawes  REFERRING PROVIDER: Harvie Heck, MD   END OF SESSION:   End of Session - 03/28/22 0905     Visit Number 8    Number of Visits 25    Date for SLP Re-Evaluation 05/16/22    SLP Start Time 0930    SLP Stop Time  1015    SLP Time Calculation (min) 45 min    Activity Tolerance Patient tolerated treatment well                Past Medical History:  Diagnosis Date   Acid reflux    Alcohol abuse    quit 10 y ago   Arthritis    BPH (benign prostatic hyperplasia)    CAD (coronary artery disease)    Cervical spondylosis without myelopathy 06/02/2015   COPD (chronic obstructive pulmonary disease) ([place])    Emphysema lung ([place])    GERD (gastroesophageal reflux disease)    High cholesterol    Hypertension    Parkinson's disease ([place])    PVD (peripheral vascular disease) ([place])    Tremor 06/02/2015   Past Surgical History:  Procedure Laterality Date   TONSILLECTOMY     VASECTOMY     Patient Active Problem List   Diagnosis Date Noted   Leukopenia 02/28/2022   Acquired deformities of toe(s), unspecified, unspecified foot 11/27/2021   Asthma 11/27/2021   Cervicalgia 11/27/2021   Corns and callosities 11/27/2021   Disturbance of skin sensation 11/27/2021   Elevated prostate specific antigen (PSA) 11/27/2021   Excessive attrition of teeth 11/27/2021   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   Family history of cancer 11/27/2021   Family history of malignant neoplasm of prostate 11/27/2021   Hyperlipidemia 11/27/2021   Iron deficiency anemia, unspecified 11/27/2021   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[place]) 11/27/2021   Myalgia 11/27/2021   Nail dystrophy 11/27/2021   Benign neoplasm of other parts of mouth 11/27/2021   Open angle with borderline findings, high risk, left eye  11/27/2021   Other abnormal glucose 11/27/2021   Other reduced mobility 11/27/2021   Other secondary cataract, bilateral 11/27/2021   Pain in right thigh 11/27/2021   Pain in right knee 11/27/2021   Pain in unspecified shoulder 11/27/2021   Persons encountering health services in other specified circumstances 11/27/2021   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/27/2021   Right lower quadrant pain 11/27/2021   Tinnitus 11/27/2021   Trochanteric bursitis, right hip 11/27/2021   Unilateral primary osteoarthritis, right hip 11/27/2021   Unspecified fall, initial encounter 11/27/2021   Vitamin D deficiency, unspecified 11/27/2021   Weakness 11/27/2021   Constipation 08/28/2021   Chronic obstructive pulmonary disease ([place]) 05/31/2021   Depression 04/13/2021   Parkinson's disease ([place]) 02/28/2021   Weakness of both legs 02/01/2021   Dysphagia 02/01/2021   Weakness of right leg 01/06/2021   Lumbar radiculopathy 12/30/2020   Prostate cancer ([place]) 06/09/2020   Well adult exam 06/04/2019   BPH (benign prostatic hyperplasia) 06/04/2019   Bilateral carotid bruits 06/04/2019   Meniere disease, 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   Emphysema of lung ([place]) 06/04/2019   Spondylolisthesis at L4-L5 level 08/27/2017   Spinal stenosis in cervical region 08/27/2017   Tremor 06/02/2015   Cervical spondylosis without myelopathy 06/02/2015   Alcoholism in remission ([place]) 06/09/2014   Coronary arteriosclerosis 07/30/2009  History of colonoscopy 07/30/2009   Neutropenia ([place]) 07/30/2008   HIP PAIN 11/27/2007   Essential hypertension 07/25/2007   Atrial fibrillation ([place]) 07/25/2007   GERD 07/25/2007   ERECTILE DYSFUNCTION 07/25/2007   LOW BACK PAIN 07/25/2007    ONSET DATE: March 2022 PD dx  REFERRING DIAG:  R13.10 (ICD-10-CM) - Dysphagia   THERAPY DIAG:  Dysarthria and anarthria  Rationale for Evaluation and Treatment  Rehabilitation  SUBJECTIVE: "a little" re: HEP completion    PAIN:  Are you having pain? Yes 2-3/10 R leg/hip  OBJECTIVE:   TODAY'S TREATMENT:   03-28-22: Pt reports "some" practice, reports did not use book. Gives excuse for being "busy." SLP provides pt with strategies to optimize his home practice, see pt instructions. SLP advised that without consistent home practice, will likely not see as much progress as he would like. Throughout conversation, pt averages 67 dB with occasional self-correction to increase vocal intensity. Led pt through speak out lesson 12. Averages as follows: 95 dB loud "ah"; counting 83 dB; reading 75 dB; cognitive exercise 73 dB. Usual cues to drink water throughout, slow rate to aid in deliberate speech. Mod-I for maintaining intensity throughout structured exercises. Concluding 7 minute conversation, pt with 72 dB average volume.  03-26-22: Lee [name] reports he  self advocates when making personal and business phone calls to prepare his listener to focus on his speech.  Targeted improving vocal quality and increasing intensity through progressively difficulty speech tasks using Speak Out! program, lesson 10 ST leads pt through exercises providing usual model prior to pt execution.Rare min-A required to achieve target dB this date. Averages this date: loud "ah" 92 dB; reading 78dB; cognitive speech task 75 dB. In structured speech task naming photos and explaining odd one out of 4 with average Side comments and conversational sample of approx 3-5 minutes, pt averages 70 dB with usual min-A.    PATIENT EDUCATION: Education details: See above (and patient instructions) Person educated: Patient Education method: Explanation, Demonstration, Verbal cues, and Handouts Education comprehension: verbalized understanding, returned demonstration, verbal cues required, and needs further education     HOME EXERCISE PROGRAM: Speak Out     GOALS: Goals reviewed with patient? Yes    SHORT TERM GOALS: Target date: 03/26/22 Pt will complete HEP for dysarthria with occasional min A over 2 sessions Baseline:  Goal status: ongoing   2.  Pt will average 70dB 18/20 sentences with rare min A over 2 sessions Baseline: 03-07-22 Goal status: ongoing   3.  Pt will average 70dB over 8 minute simple conversation with occasional min A over 2 sessions Baseline:  Goal status: ongoing   4.  Pt will complete MBSS Baseline:  Goal status: Met   5.  Pt will follow diet modifications and swallow precautions after MBSS with occasional min A Baseline:  Goal status: ongoing     LONG TERM GOALS: Target date: 05/16/22   Pt will complete HEP for dysarthria with rare min A over 2 sessions Baseline:  Goal status: ongoing   2.  Pt will complete HEP for dysphagia if indicated s/p MBSS with occasional min A Baseline:  Goal status: deferred, WFL swallow   3.  Pt will average 70 dB during 15 minute conversation with rare min A over 2 sessions Baseline:  Goal status: ongoing   4.  Pt will improve score on Communicative Effectiveness Survey Baseline: 13/32 Goal status: ongoing     ASSESSMENT:   CLINICAL IMPRESSION: Patient is a 76 y.o. male who  was seen today for dysphagia and dysarthria. Conducted ongoing education and training of recommended swallow precautions to optimize safety during swallowing as well as Speak Out! Program to maximize patient vocal intensity and clarity. Recommend skilled ST to maximize intelligibility for safety, QOL and safety of swallow.    OBJECTIVE IMPAIRMENTS  Objective impairments include memory, dysphagia, and dysarthria. These impairments are limiting patient from effectively communicating at home and in community and safety when swallowing. Factors affecting potential to achieve goals and functional outcome are medical prognosis. Patient will benefit from skilled SLP services to address above impairments and improve overall function.   REHAB POTENTIAL:  Good   PLAN: SLP FREQUENCY: 2x/week   SLP DURATION: 12 weeks   PLANNED INTERVENTIONS: Aspiration precaution training, Pharyngeal strengthening exercises, Diet toleration management , Language facilitation, Environmental controls, Trials of upgraded texture/liquids, Cueing hierachy, Cognitive reorganization, Internal/external aids, Functional tasks, Multimodal communication approach, and SLP instruction and feedback    Su Monks, [place] 03/28/2022, 9:06 AM

## 2022-03-28 NOTE — Therapy (Signed)
OUTPATIENT OCCUPATIONAL THERAPY PARKINSON'S TREATMENT  Patient Name: Court Gracia MRN: [id number] DOB:January 01, 1946, 76 y.o., male Today's Date: 03/28/2022  PCP: Dr. Lew Dawes REFERRING PROVIDER: Dr. Landry Mellow   OT End of Session - 03/28/22 1220     Visit Number 5    Number of Visits 25    Date for OT Re-Evaluation 05/17/22    Authorization Time Period through 06/01/22- most likely approved for 15 visits still need to check    Authorization - Visit Number 5    Authorization - Number of Visits 15    OT Start Time 0852    OT Stop Time 0930    OT Time Calculation (min) 38 min    Activity Tolerance Patient tolerated treatment well    Behavior During Therapy Flat affect                  Past Medical History:  Diagnosis Date   Acid reflux    Alcohol abuse    quit 10 y ago   Arthritis    BPH (benign prostatic hyperplasia)    CAD (coronary artery disease)    Cervical spondylosis without myelopathy 06/02/2015   COPD (chronic obstructive pulmonary disease) (HCC)    Emphysema lung (HCC)    GERD (gastroesophageal reflux disease)    High cholesterol    Hypertension    Parkinson's disease ([place])    PVD (peripheral vascular disease) ([place])    Tremor 06/02/2015   Past Surgical History:  Procedure Laterality Date   TONSILLECTOMY     VASECTOMY     Patient Active Problem List   Diagnosis Date Noted   Leukopenia 02/28/2022   Acquired deformities of toe(s), unspecified, unspecified foot 11/27/2021   Asthma 11/27/2021   Cervicalgia 11/27/2021   Corns and callosities 11/27/2021   Disturbance of skin sensation 11/27/2021   Elevated prostate specific antigen (PSA) 11/27/2021   Excessive attrition of teeth 11/27/2021   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   Family history of cancer 11/27/2021   Family history of malignant neoplasm of prostate 11/27/2021   Hyperlipidemia 11/27/2021   Iron deficiency anemia, unspecified 11/27/2021   Moderate recurrent major  depression ([place]) 11/27/2021   Myalgia 11/27/2021   Nail dystrophy 11/27/2021   Benign neoplasm of other parts of mouth 11/27/2021   Open angle with borderline findings, high risk, left eye 11/27/2021   Other abnormal glucose 11/27/2021   Other reduced mobility 11/27/2021   Other secondary cataract, bilateral 11/27/2021   Pain in right thigh 11/27/2021   Pain in right knee 11/27/2021   Pain in unspecified shoulder 11/27/2021   Persons encountering health services in other specified circumstances 11/27/2021   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/27/2021   Right lower quadrant pain 11/27/2021   Tinnitus 11/27/2021   Trochanteric bursitis, right hip 11/27/2021   Unilateral primary osteoarthritis, right hip 11/27/2021   Unspecified fall, initial encounter 11/27/2021   Vitamin D deficiency, unspecified 11/27/2021   Weakness 11/27/2021   Constipation 08/28/2021   Chronic obstructive pulmonary disease ([place]) 05/31/2021   Depression 04/13/2021   Parkinson's disease ([place]) 02/28/2021   Weakness of both legs 02/01/2021   Dysphagia 02/01/2021   Weakness of right leg 01/06/2021   Lumbar radiculopathy 12/30/2020   Prostate cancer ([place]) 06/09/2020   Well adult exam 06/04/2019   BPH (benign prostatic hyperplasia) 06/04/2019   Bilateral carotid bruits 06/04/2019   Meniere disease, 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   Emphysema of lung ([place]) 06/04/2019   Spondylolisthesis at  L4-L5 level 08/27/2017   Spinal stenosis in cervical region 08/27/2017   Tremor 06/02/2015   Cervical spondylosis without myelopathy 06/02/2015   Alcoholism in remission ([place]) 06/09/2014   Coronary arteriosclerosis 07/30/2009   History of colonoscopy 07/30/2009   Neutropenia ([place]) 07/30/2008   HIP PAIN 11/27/2007   Essential hypertension 07/25/2007   Atrial fibrillation ([place]) 07/25/2007   GERD 07/25/2007   ERECTILE DYSFUNCTION 07/25/2007   LOW BACK PAIN  07/25/2007    ONSET DATE: 02/06/22-referral date  REFERRING DIAG: Parkinson's disease; Z74.09,Z78.9 (ICD-10-CM) - Impaired mobility and ADLs   THERAPY DIAG:  Muscle weakness (generalized)  Other abnormalities of gait and mobility  Attention and concentration deficit  Frontal lobe and executive function deficit  Tremor  Other symptoms and signs involving the musculoskeletal system  Other symptoms and signs involving the nervous system  Abnormal posture  Other lack of coordination  Unsteadiness on feet  SUBJECTIVE:   SUBJECTIVE STATEMENT: Pt reports ongoing hip pain  Pt accompanied by: self  PERTINENT HISTORY:   Parkinson's disease--dx in March 2022 per pt report.    Other PMH:  neck and back pain, arthritis, hx of ETOH abuse, CAD, COPD, emphysema, GERD, HTN, high cholesterol, PVD, chronic vertigo per pt   PRECAUTIONS: Fall  WEIGHT BEARING RESTRICTIONS No  PAIN:  PAIN:  Are you having pain? Yes: NPRS scale: 2/10 Pain location: hip Pain description: aching Aggravating factors: walking Relieving factors: rest   FALLS: Has patient fallen in last 6 months? multiple "lost count"   pt reports that he falls daily, but can usually catch himself--bumps and bruises  LIVING ENVIRONMENT: Lives with: lives alone.   Lives in: House/apartment Stairs: Yes: Internal: ) steps; none and External: 3 steps; can reach both Has following equipment at home: Single point cane  PLOF: Independent  PATIENT GOALS move better, stay independent   HAND DOMINANCE: Right  OBJECTIVE:   TODAY'S TREATMENT: PWR! Up seated , mod v.c for positioning , pt reports dizziness, BP 105/57. Arm bike x 5 min level 1 for conditioning, BP increased to 115/56 PWR! Hands Followed by fastening buttons with adapted strategy, min v.c and demonstration, self feeding practice scooping beans with cues to choke down on spoon, avoid shoulder hike and turing spoon when bring to mouth, mod v.c and increased  time. Therapist reviewed march in place prior to walking to minimize freezing, and importance of big movements when grasping a cup. PATIENT EDUCATION: Education details: ADL strategies for feeding, buttons Person educated: Patient Education method: Explanation, Demonstration, Tactile cues, Verbal cues,  Education comprehension: verbalized understanding, returned demonstration, verbal cues required, tactile cues required,    HOME EXERCISE PROGRAM: 03/19/22:  PWR! Hands (basic 4) and coordination HEP with focus on large amplitude movements   03/26/22- PWR! moves supine basic 4  ASSESSMENT: GOALS: Potential Goals reviewed with patient? Yes  SHORT TERM GOALS: Target date: 04/06/22- extended due to scheduling  Pt will be independent with PD-specific HEP.  Goal status: INITIAL  2.  Pt will verbalize understanding of ways to decr risk of future complications and appropriate community resources. Goal status: INITIAL  3.  Baseline:Pt will report incr ease with grooming tasks and ability to use only RUE.  Goal status: INITIAL  4.  Pt will demonstrate improved ease with fastening buttons as evidenced by decreasing 3 button/ unbutton time to:33 secs or less    Baseline: 35.41 Goal status: INITIAL  5 Pt will demonstrate improved LUE functional use as evidenced by increasing box/ blocks score by  3 blocks.  Baseline: R 53, L 49  LONG TERM GOALS: Target date: 05/17/2022  Pt will verbalize understanding of adaptive strategies/AE to incr ease and safety with ADLs/IADLs.(Cutting food, opening containers) Goal status: INITIAL  2.  Pt will demonstrate increased ease with dressing as eveidenced by decreasing PPT#4(don/ doff jacket) to 13 secs or less Baseline: 15.88sec Goal status: INITIAL  3.  Pt will be able to write at least 3 sentences with good legibility and only  no significant decr. In letter size Goal status: INITIAL  4.  Pt will improve balance/functional reach as shown by reaching at  least 10" with each UE on standing functional reach test.  Baseline: R-8", L-9" Goal status: INITIAL  CLINICAL IMPRESSION: Pt is progressing towards goals. He  benefits from cueing and  review of big movement strategies for ADLs.  PERFORMANCE DEFICITS in functional skills including ADLs, IADLs, coordination, dexterity, tone, ROM, FMC, GMC, mobility, balance, decreased knowledge of precautions, decreased knowledge of use of DME, and UE functional use, cognitive skills including attention and memory, and psychosocial skills including habits and routines and behaviors.   IMPAIRMENTS are limiting patient from ADLs and IADLs.   COMORBIDITIES has co-morbidities such as CAD, COPD, HTN, hx of neck and back pain  that affects occupational performance. Patient will benefit from skilled OT to address above impairments and improve overall function.  MODIFICATION OR ASSISTANCE TO COMPLETE EVALUATION: Min-Moderate modification of tasks or assist with assess necessary to complete an evaluation.  OT OCCUPATIONAL PROFILE AND HISTORY: Detailed assessment: Review of records and additional review of physical, cognitive, psychosocial history related to current functional performance.  CLINICAL DECISION MAKING: Moderate - several treatment options, min-mod task modification necessary  REHAB POTENTIAL: Good  EVALUATION COMPLEXITY: Moderate      PLAN: OT FREQUENCY: 2x/week  OT DURATION: 12 weeks, may modify due to scheduling/progress  PLANNED INTERVENTIONS: self care/ADL training, therapeutic exercise, therapeutic activity, neuromuscular re-education, manual therapy, passive range of motion, balance training, functional mobility training, aquatic therapy, ultrasound, paraffin, fluidotherapy, moist heat, cryotherapy, patient/family education, cognitive remediation/compensation, visual/perceptual remediation/compensation, energy conservation, coping strategies training, and DME and/or AE  instructions  RECOMMENDED OTHER SERVICES: scheduled for evals with PT and ST  CONSULTED AND AGREED WITH PLAN OF CARE: Patient  PLAN FOR NEXT SESSION:  continue handwriting/ issue handout, ADL strategies- cutting food. functional reaching with large amplitude movements.   Naphtali Zywicki, OTR/L 03/28/2022, 12:21 PM  [facility] [address] [place], [place]  [address] [phone] phone [phone] 03/28/22 12:21 PM

## 2022-03-28 NOTE — Patient Instructions (Addendum)
Getting in 10 minutes of practice 4-5x a week  Show up 15 minutes early to an already scheduled appointment. Once arrived, set timer on your phone for 10 minutes-- get out your Speak Out workbook and do a lesson.  If you finish before the timer, great!  If you don't, that's fine, you did your 10 minutes.   If for some reason you you run a little bit late you can do a shortened practice OR complete the 10 minutes after your appointment.  Thursday 03/29/2022 10:30 Chiropractor appointment   Arrive at 10:15 - lesson 12  Friday 03/30/2022 ______ [facility] appointment   Arrive at ______ - lesson 13  Saturday or Sunday - at home set a 10 minute timer,  lesson 14   Monday - at home, set 10 minute timer, lesson 15

## 2022-04-03 ENCOUNTER — Ambulatory Visit: Payer: No Typology Code available for payment source | Admitting: Pulmonary Disease

## 2022-04-03 NOTE — Progress Notes (Deleted)
Subjective:   PATIENT ID: Lee [name] DOB: March 03, 1946, MRN: [id number]   HPI  No chief complaint on file.   Reason for Visit: PFT follow-up  Lee [name] is a 76 year old [name] former smoker with Parkinson's disease with gait and balance impairment, atrial fibrillation and HTN who presents for follow-up.   Initial Consult He was referred by [facility]. [facility] reviewed [facility]17/21. Faxed notes reviewed. He was previously seen by Dr. Gwenette [name] at the [facility]. He is on [place]. Has a nebulizer machine. Has not been taking singulair on a routine basis. He was diagnosed with COPD >10 years. He reports shortness of breath requiring pursed lip breathing. Denies cough and wheezing. His gait issues are more limiting and has previously completed physical therapy.   12/29/21 Since our last visit started on Dulera in addition to his Spiriva.  PFTs were completed today. After his diagnosis of Parkinson it has been more difficult for him to ambulate and requires a lot of effort. He has shortness of breath associated with his exertion so is unsure if his current inhalers are helping. The timing of using the inhalers is not consistent as well given his sometimes limited hand function. Denies cough and wheezing.  Social History: 17 pack- years. 1 pack per day. Quit in 1982. Cold [place] Previously [place]. Not active smoker  [employer]. Two years in [place]  Past Medical History:  Diagnosis Date   Acid reflux    Alcohol abuse    quit 10 y ago   Arthritis    BPH (benign prostatic hyperplasia)    CAD (coronary artery disease)    Cervical spondylosis without myelopathy 06/02/2015   COPD (chronic obstructive pulmonary disease) (HCC)    Emphysema lung (HCC)    GERD (gastroesophageal reflux disease)    High cholesterol    Hypertension    Parkinson's disease ([place])    PVD (peripheral vascular disease) ([place])    Tremor 06/02/2015     Family History   Problem Relation Age of Onset   Cancer Brother 63       prostate ca   Irregular heart beat Mother    Irregular heart beat Father    Tremor Father    Cancer Sister      Social History   Occupational History   Occupation: retired  Tobacco Use   Smoking status: Former    Types: Cigarettes    Quit date: 06/09/2005    Years since quitting: 16.8   Smokeless tobacco: Never  Substance and Sexual Activity   Alcohol use: No    Comment: quit 10 years ago   Drug use: No   Sexual activity: Yes    Allergies  Allergen Reactions   Acyclovir Swelling   Carbidopa-Levodopa     It made me angry   Enalapril Maleate Other (See Comments)    Reaction:  Unknown      Outpatient Medications Prior to Visit  Medication Sig Dispense Refill   albuterol (PROVENTIL HFA;VENTOLIN HFA) 108 (90 Base) MCG/ACT inhaler Inhale 2 puffs into the lungs every 4 (four) hours as needed for wheezing or shortness of breath. 1 Inhaler 0   Alum Hydroxide-Mag Carbonate 160-105 MG CHEW CHEW 1 TABLET BY MOUTH AS DIRECTED BY YOUR PROVIDER FOR STOMACH UPSET/DYSPEPSIA UP TO THREE TIMES DAILY     ascorbic acid (VITAMIN C) 500 MG tablet TAKE ONE TABLET BY MOUTH DAILY TO HELP WITH IRON STORES AND IMMUNE SYSTEM. PLEASE  TAKE EVERY DAY AND ESPECIALLY WHEN YOU TAKE FERROUS SULFATE.     aspirin EC 81 MG tablet      BLACK PEPPER-TURMERIC PO Take by mouth daily. Mixes as a powder for back pain (ginger, cinnamon, black pepper, turmeric)     carbidopa-levodopa (SINEMET IR) 25-100 MG tablet Take 2 tablets by mouth 3 (three) times daily.     cetirizine (ZYRTEC) 10 MG tablet      Cholecalciferol 50 MCG (2000 UT) TABS      CINNAMON PO Mixes as a powder for back pain (ginger, cinnamon, black pepper, turmeric)     diclofenac Sodium (VOLTAREN) 1 % GEL      FEROSUL 325 (65 Fe) MG tablet Take by mouth.     fluticasone (FLONASE) 50 MCG/ACT nasal spray INSTILL 1 SPRAY IN EACH NOSTRIL TWICE A DAY FOR DIZZINESS     folic acid (FOLVITE) 1 MG  tablet Take 1 mg by mouth daily.     Ginger, Zingiber officinalis, (GINGER PO) Mixes as a powder for back pain (ginger, cinnamon, black pepper, turmeric)     hydrochlorothiazide (HYDRODIURIL) 25 MG tablet      methocarbamol (ROBAXIN) 500 MG tablet TAKE ONE TABLET BY MOUTH AFTER SUPPER -- TAKE AFTER EVENING MEAL WHEN NEEDED.  NO DRIVING FOR 8 HOURS. FOR MUSCLE PAIN/SPASMS     mirtazapine (REMERON) 7.5 MG tablet Take 1 tablet (7.5 mg total) by mouth at bedtime. 30 tablet 6   mometasone-formoterol (DULERA) 200-5 MCG/ACT AERO Inhale 2 puffs into the lungs in the morning and at bedtime. 3 each 2   montelukast (SINGULAIR) 10 MG tablet Take 1 tablet (10 mg total) by mouth at bedtime. 30 tablet 6   Multiple Vitamin (QUINTABS) TABS Take by mouth.     naproxen (NAPROSYN) 500 MG tablet      OVER THE COUNTER MEDICATION NeuroQ daily     oxybutynin (DITROPAN-XL) 10 MG 24 hr tablet Take 10 mg by mouth at bedtime.     pantoprazole (PROTONIX) 40 MG tablet Take by mouth.     sildenafil (VIAGRA) 100 MG tablet      tamsulosin (FLOMAX) 0.4 MG CAPS capsule Take 0.4 mg by mouth.     Tiotropium Bromide Monohydrate 2.5 MCG/ACT AERS Inhale 2 puffs into the lungs daily.     vitamin C (ASCORBIC ACID) 500 MG tablet Take by mouth.     No facility-administered medications prior to visit.    Review of Systems  Constitutional:  Negative for chills, diaphoresis, fever, malaise/fatigue and weight loss.  HENT:  Negative for congestion.   Respiratory:  Positive for shortness of breath. Negative for cough, hemoptysis, sputum production and wheezing.   Cardiovascular:  Negative for chest pain, palpitations and leg swelling.     Objective:   There were no vitals filed for this visit.     Physical Exam: General: Well-appearing, no acute distress HENT: [place], AT Eyes: EOMI, no scleral icterus Respiratory: ***Clear to auscultation bilaterally.  No crackles, wheezing or rales Cardiovascular: RRR, -M/R/G, no  JVD Extremities:-Edema,-tenderness Neuro: AAO x4, CNII-XII grossly intact Psych: Normal mood, normal affect   Data Reviewed:  Imaging: CXR 02/01/21 - Normal. No infiltrate, effusion or edema.  PFT: 12/29/21 FVC 2.24 (58%) FEV1 1.10 (38%) Ratio 47  TLC 98% RV 153% RV/TLC 173% DLCO 43% Interpretation: Severe obstructive lung defect with air trapping and reduced DLCO consistent with emphysema.   Labs: CBC    Component Value Date/Time   WBC 3.5 (L) 02/28/2022 1008   RBC  4.12 (L) 02/28/2022 1008   HGB 13.8 02/28/2022 1008   HCT 40.7 02/28/2022 1008   PLT 192.0 02/28/2022 1008   MCV 98.7 02/28/2022 1008   MCH 32.9 01/05/2017 0250   MCHC 33.9 02/28/2022 1008   RDW 12.7 02/28/2022 1008   LYMPHSABS 0.8 02/28/2022 1008   MONOABS 0.5 02/28/2022 1008   EOSABS 0.2 02/28/2022 1008   BASOSABS 0.0 02/28/2022 1008   Absolute 02/01/21 - 300    Assessment & Plan:   Discussion: 76 year old [name] former smoker with Parkinson's disease with gait and balance impairment, atrial fibrillation and hypertension who presents for COPD follow-up.  He is symptomatic on triple therapy however may be exacerbated by his deconditioning.  PFTs reviewed today which demonstrated severe emphysema. Provided teaching on inhaler technique and use of spacer  76 year old [name] former smoker with Parkinson's disease with gait and balance impairment, atrial fibrillation hypertension who presents for COPD follow-up.***  COPD with emphysema --ORDER Spacer --CONTINUE Dulera 200-5 mcg TWO puffs TWICE a day. Use with spacer --CONTINUE Spiriva 2.5 mcg TWO puffs ONCE a day --CONTINUE Albuterol nebulizer AS NEEDED for shortness of breath or wheezing --CONTINUE Singulair 10 mg daily  Action Plan For worsening shortness of breath, wheezing and cough that do not improve in 24-48 hours, please our office for evaluation and/or prednisone taper.  Health Maintenance Immunization History  Administered Date(s) Administered   Fluad  Quad(high Dose 65+) 04/29/2020, 05/10/2020, 05/02/2021   Influenza Split 05/31/2015   Influenza Whole 07/25/2007   Influenza, High Dose Seasonal PF 03/30/2013, 04/15/2013, 04/29/2017, 04/23/2018, 04/27/2019, 05/21/2019   Influenza,inj,Quad PF,6+ Mos 05/28/2016   Influenza-Unspecified 05/31/2007, 06/18/2008, 05/16/2009, 04/11/2010, 04/29/2010, 07/27/2011, 05/15/2012, 05/12/2014   Moderna Covid-19 Vaccine Bivalent Booster 75yr & up 07/26/2021   Moderna Sars-Covid-2 Vaccination 08/25/2019, 09/22/2019, 06/24/2020, 12/08/2020   Pneumococcal Conjugate-13 06/29/2014   Pneumococcal Polysaccharide-23 05/28/2013   Pneumococcal-Unspecified 05/06/2007   Rabies, IM 04/20/2020   Td,absorbed, Preservative Free, Adult Use, Lf Unspecified 04/17/2021   Tdap 09/07/2011   Zoster Recombinat (Shingrix) 12/11/2019, 02/16/2020   CT Lung Screen - not qualified. >15 years since quit  No orders of the defined types were placed in this encounter. No orders of the defined types were placed in this encounter.   No follow-ups on file.  I have spent a total time of***-minutes on the day of the appointment reviewing prior documentation, coordinating care and discussing medical diagnosis and plan with the patient/family. Past medical history, allergies, medications were reviewed. Pertinent imaging, labs and tests included in this note have been reviewed and interpreted independently by me.  [place] MD LSouth Park TownshipPulmonary Critical Care 04/03/2022 8:53 AM  Office Number [phone]

## 2022-04-04 ENCOUNTER — Ambulatory Visit: Payer: No Typology Code available for payment source | Admitting: Occupational Therapy

## 2022-04-04 ENCOUNTER — Encounter: Payer: Self-pay | Admitting: Physical Therapy

## 2022-04-04 ENCOUNTER — Ambulatory Visit: Payer: No Typology Code available for payment source | Admitting: Speech Pathology

## 2022-04-04 ENCOUNTER — Ambulatory Visit: Payer: No Typology Code available for payment source | Attending: Internal Medicine | Admitting: Physical Therapy

## 2022-04-04 VITALS — BP 115/59 | HR 81

## 2022-04-04 DIAGNOSIS — R29898 Other symptoms and signs involving the musculoskeletal system: Secondary | ICD-10-CM | POA: Insufficient documentation

## 2022-04-04 DIAGNOSIS — R29818 Other symptoms and signs involving the nervous system: Secondary | ICD-10-CM

## 2022-04-04 DIAGNOSIS — M6281 Muscle weakness (generalized): Secondary | ICD-10-CM | POA: Diagnosis not present

## 2022-04-04 DIAGNOSIS — R278 Other lack of coordination: Secondary | ICD-10-CM | POA: Insufficient documentation

## 2022-04-04 DIAGNOSIS — R471 Dysarthria and anarthria: Secondary | ICD-10-CM | POA: Diagnosis not present

## 2022-04-04 DIAGNOSIS — R251 Tremor, unspecified: Secondary | ICD-10-CM | POA: Diagnosis not present

## 2022-04-04 DIAGNOSIS — R41840 Attention and concentration deficit: Secondary | ICD-10-CM | POA: Insufficient documentation

## 2022-04-04 DIAGNOSIS — R2681 Unsteadiness on feet: Secondary | ICD-10-CM | POA: Insufficient documentation

## 2022-04-04 DIAGNOSIS — R293 Abnormal posture: Secondary | ICD-10-CM

## 2022-04-04 DIAGNOSIS — R2689 Other abnormalities of gait and mobility: Secondary | ICD-10-CM | POA: Insufficient documentation

## 2022-04-04 DIAGNOSIS — R41844 Frontal lobe and executive function deficit: Secondary | ICD-10-CM

## 2022-04-04 NOTE — Therapy (Signed)
OUTPATIENT OCCUPATIONAL THERAPY PARKINSON'S TREATMENT  Patient Name: Lee [name] MRN: [id number] DOB:[birth year], 76 y.o., male Today's Date: 04/04/2022  PCP: Dr. Lew Dawes REFERRING PROVIDER: Dr. Landry Mellow   OT End of Session - 04/04/22 1140     Visit Number 6    Number of Visits 25    Date for OT Re-Evaluation 05/17/22    Authorization Time Period through 06/01/22- most likely approved for 15 visits still need to check    Authorization - Visit Number 6    Authorization - Number of Visits 15    OT Start Time 1103    OT Stop Time 1141    OT Time Calculation (min) 38 min    Activity Tolerance Patient tolerated treatment well    Behavior During Therapy Flat affect                   Past Medical History:  Diagnosis Date   Acid reflux    Alcohol abuse    quit 10 y ago   Arthritis    BPH (benign prostatic hyperplasia)    CAD (coronary artery disease)    Cervical spondylosis without myelopathy 06/02/2015   COPD (chronic obstructive pulmonary disease) ([place])    Emphysema lung ([place])    GERD (gastroesophageal reflux disease)    High cholesterol    Hypertension    Parkinson's disease ([place])    PVD (peripheral vascular disease) ([place])    Tremor 06/02/2015   Past Surgical History:  Procedure Laterality Date   TONSILLECTOMY     VASECTOMY     Patient Active Problem List   Diagnosis Date Noted   Leukopenia 02/28/2022   Acquired deformities of toe(s), unspecified, unspecified foot 11/27/2021   Asthma 11/27/2021   Cervicalgia 11/27/2021   Corns and callosities 11/27/2021   Disturbance of skin sensation 11/27/2021   Elevated prostate specific antigen (PSA) 11/27/2021   Excessive attrition of teeth 11/27/2021   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   Family history of cancer 11/27/2021   Family history of malignant neoplasm of prostate 11/27/2021   Hyperlipidemia 11/27/2021   Iron deficiency anemia, unspecified 11/27/2021   Moderate recurrent  major depression ([place]) 11/27/2021   Myalgia 11/27/2021   Nail dystrophy 11/27/2021   Benign neoplasm of other parts of mouth 11/27/2021   Open angle with borderline findings, high risk, left eye 11/27/2021   Other abnormal glucose 11/27/2021   Other reduced mobility 11/27/2021   Other secondary cataract, bilateral 11/27/2021   Pain in right thigh 11/27/2021   Pain in right knee 11/27/2021   Pain in unspecified shoulder 11/27/2021   Persons encountering health services in other specified circumstances 11/27/2021   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/27/2021   Right lower quadrant pain 11/27/2021   Tinnitus 11/27/2021   Trochanteric bursitis, right hip 11/27/2021   Unilateral primary osteoarthritis, right hip 11/27/2021   Unspecified fall, initial encounter 11/27/2021   Vitamin D deficiency, unspecified 11/27/2021   Weakness 11/27/2021   Constipation 08/28/2021   Chronic obstructive pulmonary disease ([place]) 05/31/2021   Depression 04/13/2021   Parkinson's disease ([place]) 02/28/2021   Weakness of both legs 02/01/2021   Dysphagia 02/01/2021   Weakness of right leg 01/06/2021   Lumbar radiculopathy 12/30/2020   Prostate cancer ([place]) 06/09/2020   Well adult exam 06/04/2019   BPH (benign prostatic hyperplasia) 06/04/2019   Bilateral carotid bruits 06/04/2019   Meniere disease, 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   Emphysema of lung ([place]) 06/04/2019   Spondylolisthesis  at L4-L5 level 08/27/2017   Spinal stenosis in cervical region 08/27/2017   Tremor 06/02/2015   Cervical spondylosis without myelopathy 06/02/2015   Alcoholism in remission ([place]) 06/09/2014   Coronary arteriosclerosis 07/30/2009   History of colonoscopy 07/30/2009   Neutropenia ([place]) 07/30/2008   HIP PAIN 11/27/2007   Essential hypertension 07/25/2007   Atrial fibrillation ([place]) 07/25/2007   GERD 07/25/2007   ERECTILE DYSFUNCTION 07/25/2007   LOW BACK  PAIN 07/25/2007    ONSET DATE: 02/06/22-referral date  REFERRING DIAG: Parkinson's disease; Z74.09,Z78.9 (ICD-10-CM) - Impaired mobility and ADLs   THERAPY DIAG:  Other abnormalities of gait and mobility  Muscle weakness (generalized)  Other symptoms and signs involving the nervous system  Abnormal posture  Attention and concentration deficit  Frontal lobe and executive function deficit  Other symptoms and signs involving the musculoskeletal system  Other lack of coordination  Unsteadiness on feet  SUBJECTIVE:   SUBJECTIVE STATEMENT: Pt reports ongoing hip pain  Pt accompanied by: self  PERTINENT HISTORY:   Parkinson's disease--dx in March 2022 per pt report.    Other PMH:  neck and back pain, arthritis, hx of ETOH abuse, CAD, COPD, emphysema, GERD, HTN, high cholesterol, PVD, chronic vertigo per pt   PRECAUTIONS: Fall  WEIGHT BEARING RESTRICTIONS No  PAIN:  PAIN:  Are you having pain? Yes: NPRS scale: 2/10 Pain location: hip Pain description: aching Aggravating factors: walking Relieving factors: rest   FALLS: Has patient fallen in last 6 months? multiple "lost count"   pt reports that he falls daily, but can usually catch himself--bumps and bruises  LIVING ENVIRONMENT: Lives with: lives alone.   Lives in: House/apartment Stairs: Yes: Internal: ) steps; none and External: 3 steps; can reach both Has following equipment at home: Single point cane  PLOF: Independent  PATIENT GOALS move better, stay independent   HAND DOMINANCE: Right  OBJECTIVE:   TODAY'S TREATMENT: PWR! Moves seated 10 reps each, min v.c with pt counting out loud ADL strategies for cutting food with practice, big movements with ADLS- handout issued and reviewed,  handwriting strategies, min v.c with improved legibility with printing, handout issued.  PATIENT EDUCATION: Education details: ADL strategies for cutting food with practice, big movements with ADLS- handout issued and  reviewed, handwriting strategies, with improved legibility with printing, handout issued.PWR! Moves basic 4 performed need to issue handout Person educated: Patient,  Education method: Explanation, Demonstration, Tactile cues, Verbal cues,  Education comprehension: verbalized understanding, returned demonstration, verbal cues required, tactile cues required,    HOME EXERCISE PROGRAM: 03/19/22:  PWR! Hands (basic 4) and coordination HEP with focus on large amplitude movements   03/26/22- PWR! moves supine basic 4 04/04/22- PWR moves seated, need to issue handout  ASSESSMENT: GOALS: Potential Goals reviewed with patient? Yes  SHORT TERM GOALS: Target date: 04/06/22- extended due to scheduling  Pt will be independent with PD-specific HEP.  Goal status: Met  2.  Pt will verbalize understanding of ways to decr risk of future complications and appropriate community resources. Goal status: I ongoing   3.  Baseline:Pt will report incr ease with grooming tasks and ability to use only RUE.  Goal status: INITIAL  4.  Pt will demonstrate improved ease with fastening buttons as evidenced by decreasing 3 button/ unbutton time to:33 secs or less    Baseline: 35.41 Goal status: INITIAL  5 Pt will demonstrate improved LUE functional use as evidenced by increasing box/ blocks score by 3 blocks.  Baseline: R 53, L 49  LONG TERM GOALS: Target date: 05/17/2022  Pt will verbalize understanding of adaptive strategies/AE to incr ease and safety with ADLs/IADLs.(Cutting food, opening containers) Goal status: INITIAL  2.  Pt will demonstrate increased ease with dressing as eveidenced by decreasing PPT#4(don/ doff jacket) to 13 secs or less Baseline: 15.88sec Goal status: INITIAL  3.  Pt will be able to write at least 3 sentences with good legibility and only  no significant decr. In letter size Goal status: INITIAL  4.  Pt will improve balance/functional reach as shown by reaching at least 10" with each  UE on standing functional reach test.  Baseline: R-8", L-9" Goal status: INITIAL  CLINICAL IMPRESSION: Pt is progressing towards goals. He  benefits from cueing and  review of big movement strategies for ADLs.  PERFORMANCE DEFICITS in functional skills including ADLs, IADLs, coordination, dexterity, tone, ROM, FMC, GMC, mobility, balance, decreased knowledge of precautions, decreased knowledge of use of DME, and UE functional use, cognitive skills including attention and memory, and psychosocial skills including habits and routines and behaviors.   IMPAIRMENTS are limiting patient from ADLs and IADLs.   COMORBIDITIES has co-morbidities such as CAD, COPD, HTN, hx of neck and back pain  that affects occupational performance. Patient will benefit from skilled OT to address above impairments and improve overall function.  MODIFICATION OR ASSISTANCE TO COMPLETE EVALUATION: Min-Moderate modification of tasks or assist with assess necessary to complete an evaluation.  OT OCCUPATIONAL PROFILE AND HISTORY: Detailed assessment: Review of records and additional review of physical, cognitive, psychosocial history related to current functional performance.  CLINICAL DECISION MAKING: Moderate - several treatment options, min-mod task modification necessary  REHAB POTENTIAL: Good  EVALUATION COMPLEXITY: Moderate      PLAN: OT FREQUENCY: 2x/week  OT DURATION: 12 weeks, may modify due to scheduling/progress  PLANNED INTERVENTIONS: self care/ADL training, therapeutic exercise, therapeutic activity, neuromuscular re-education, manual therapy, passive range of motion, balance training, functional mobility training, aquatic therapy, ultrasound, paraffin, fluidotherapy, moist heat, cryotherapy, patient/family education, cognitive remediation/compensation, visual/perceptual remediation/compensation, energy conservation, coping strategies training, and DME and/or AE instructions  RECOMMENDED OTHER  SERVICES: scheduled for evals with PT and ST  CONSULTED AND AGREED WITH PLAN OF CARE: Patient  PLAN FOR NEXT SESSION:  issue PWR!moves seated handout,  functional reaching with large amplitude movements.   Duffy Dantonio, OTR/L 04/04/2022, 12:08 PM  [facility] [address] [place], [place]  [address] [phone] phone [phone] 04/04/22 12:08 PM

## 2022-04-04 NOTE — Patient Instructions (Signed)

## 2022-04-04 NOTE — Therapy (Signed)
OUTPATIENT SPEECH LANGUAGE PATHOLOGY TREATMENT NOTE   Patient Name: Lee [name] MRN: [id number] DOB:07-May-1946, 76 y.o., male Today's Date: 04/04/2022  PCP: Dr. Lew [name]  REFERRING PROVIDER: Harvie Heck, MD   END OF SESSION:        Past Medical History:  Diagnosis Date   Acid reflux    Alcohol abuse    quit 10 y ago   Arthritis    BPH (benign prostatic hyperplasia)    CAD (coronary artery disease)    Cervical spondylosis without myelopathy 06/02/2015   COPD (chronic obstructive pulmonary disease) ([place])    Emphysema lung ([place])    GERD (gastroesophageal reflux disease)    High cholesterol    Hypertension    Parkinson's disease ([place])    PVD (peripheral vascular disease) ([place])    Tremor 06/02/2015   Past Surgical History:  Procedure Laterality Date   TONSILLECTOMY     VASECTOMY     Patient Active Problem List   Diagnosis Date Noted   Leukopenia 02/28/2022   Acquired deformities of toe(s), unspecified, unspecified foot 11/27/2021   Asthma 11/27/2021   Cervicalgia 11/27/2021   Corns and callosities 11/27/2021   Disturbance of skin sensation 11/27/2021   Elevated prostate specific antigen (PSA) 11/27/2021   Excessive attrition of teeth 11/27/2021   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   Family history of cancer 11/27/2021   Family history of malignant neoplasm of prostate 11/27/2021   Hyperlipidemia 11/27/2021   Iron deficiency anemia, unspecified 11/27/2021   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[place]) 11/27/2021   Myalgia 11/27/2021   Nail dystrophy 11/27/2021   Benign neoplasm of other parts of mouth 11/27/2021   Open angle with borderline findings, high risk, left eye 11/27/2021   Other abnormal glucose 11/27/2021   Other reduced mobility 11/27/2021   Other secondary cataract, bilateral 11/27/2021   Pain in right thigh 11/27/2021   Pain in right knee 11/27/2021   Pain in unspecified shoulder 11/27/2021   Persons encountering health  services in other specified circumstances 11/27/2021   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/27/2021   Right lower quadrant pain 11/27/2021   Tinnitus 11/27/2021   Trochanteric bursitis, right hip 11/27/2021   Unilateral primary osteoarthritis, right hip 11/27/2021   Unspecified fall, initial encounter 11/27/2021   Vitamin D deficiency, unspecified 11/27/2021   Weakness 11/27/2021   Constipation 08/28/2021   Chronic obstructive pulmonary disease ([place]) 05/31/2021   Depression 04/13/2021   Parkinson's disease ([place]) 02/28/2021   Weakness of both legs 02/01/2021   Dysphagia 02/01/2021   Weakness of right leg 01/06/2021   Lumbar radiculopathy 12/30/2020   Prostate cancer ([place]) 06/09/2020   Well adult exam 06/04/2019   BPH (benign prostatic hyperplasia) 06/04/2019   Bilateral carotid bruits 06/04/2019   Meniere disease, 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   Emphysema of lung ([place]) 06/04/2019   Spondylolisthesis at L4-L5 level 08/27/2017   Spinal stenosis in cervical region 08/27/2017   Tremor 06/02/2015   Cervical spondylosis without myelopathy 06/02/2015   Alcoholism in remission ([place]) 06/09/2014   Coronary arteriosclerosis 07/30/2009   History of colonoscopy 07/30/2009   Neutropenia ([place]) 07/30/2008   HIP PAIN 11/27/2007   Essential hypertension 07/25/2007   Atrial fibrillation ([place]) 07/25/2007   GERD 07/25/2007   ERECTILE DYSFUNCTION 07/25/2007   LOW BACK PAIN 07/25/2007    ONSET DATE: March 2022 PD dx  REFERRING DIAG:  R13.10 (ICD-10-CM) - Dysphagia   THERAPY DIAG:  No diagnosis found.  Rationale for Evaluation and  Treatment Rehabilitation  SUBJECTIVE: "I'm doing everything and nothing is getting better" re: improvement     PAIN:  Are you having pain? Yes 3: R Leg; 7: Head  OBJECTIVE:   TODAY'S TREATMENT:   04-04-22: Pt enters with suboptimal volume (ranging from 64-73 dB, averaging 67 dB over 5  minutes) and imprecise articulation impacting overall intelligibility, resulting in SLP having to ask for repetition x6 in opening conversation. Pt attempting to self correct through using intentional speech with intermittent success. Pt reporting ongoing fatigue and "depression" d/t lack of progress with Parkinson's management, tells SLP "I'm doing what I can." SLP provides education on requirement to use intentional speech to aid in speech clarity and intensity, advises pt he will always need to use intent d/t nature of Parkinson's, dopamine impact and it's role in automatic movements. Pt verbalizes understanding, is able to teach back what will improve his clarity with min-A. Target improving clarity of speech and vocal intensity using Speak Out! lesson 15. Usual, faded to rare, min-A to meet desired dB levels and complete exercises accurately during structured exercises. Averages as follows: 97 dB loud "ah"; counting 84 dB; reading 77 dB; cognitive exercise 69 dB. Decreased volume noted for cognitive exercise 2/2 difficulty understanding task, with some scaling volume increasing to 71 dB. Pt verbalizes appreciation for SLP feedback and encouragement throughout session.   03-28-22: Pt reports "some" practice, reports did not use book. Gives excuse for being "busy." SLP provides pt with strategies to optimize his home practice, see pt instructions. SLP advised that without consistent home practice, will likely not see as much progress as he would like. Throughout conversation, pt averages 67 dB with occasional self-correction to increase vocal intensity. Led pt through speak out lesson 12. Averages as follows: 95 dB loud "ah"; counting 83 dB; reading 75 dB; cognitive exercise 73 dB. Usual cues to drink water throughout, slow rate to aid in deliberate speech. Mod-I for maintaining intensity throughout structured exercises. Concluding 7 minute conversation, pt with 72 dB average volume.  03-26-22: [name] reports he   self advocates when making personal and business phone calls to prepare his listener to focus on his speech.  Targeted improving vocal quality and increasing intensity through progressively difficulty speech tasks using Speak Out! program, lesson 10 ST leads pt through exercises providing usual model prior to pt execution.Rare min-A required to achieve target dB this date. Averages this date: loud "ah" 92 dB; reading 78dB; cognitive speech task 75 dB. In structured speech task naming photos and explaining odd one out of 4 with average Side comments and conversational sample of approx 3-5 minutes, pt averages 70 dB with usual min-A.    PATIENT EDUCATION: Education details: See above (and patient instructions) Person educated: Patient Education method: Explanation, Demonstration, Verbal cues, and Handouts Education comprehension: verbalized understanding, returned demonstration, verbal cues required, and needs further education     HOME EXERCISE PROGRAM: Speak Out     GOALS: Goals reviewed with patient? Yes   SHORT TERM GOALS: Target date: 03/26/22 Pt will complete HEP for dysarthria with occasional min A over 2 sessions Baseline:  Goal status: Not met   2.  Pt will average 70dB 18/20 sentences with rare min A over 2 sessions Baseline: 03-07-22 Goal status: Met   3.  Pt will average 70dB over 8 minute simple conversation with occasional min A over 2 sessions Baseline:  Goal status: Met   4.  Pt will complete MBSS Baseline:  Goal status: Met  5.  Pt will follow diet modifications and swallow precautions after MBSS with occasional min A Baseline:  Goal status: deferred      LONG TERM GOALS: Target date: 05/16/22   Pt will complete HEP for dysarthria with rare min A over 2 sessions Baseline:  Goal status: ongoing   2.  Pt will complete HEP for dysphagia if indicated s/p MBSS with occasional min A Baseline:  Goal status: deferred, WFL swallow   3.  Pt will average 70 dB  during 15 minute conversation with rare min A over 2 sessions Baseline:  Goal status: ongoing   4.  Pt will improve score on Communicative Effectiveness Survey Baseline: 13/32 Goal status: ongoing     ASSESSMENT:   CLINICAL IMPRESSION: Patient is a 76 y.o. male who was seen today for dysphagia and dysarthria. Conducted ongoing education and training of recommended swallow precautions to optimize safety during swallowing as well as Speak Out! Program to maximize patient vocal intensity and clarity. Recommend skilled ST to maximize intelligibility for safety, QOL and safety of swallow.    OBJECTIVE IMPAIRMENTS  Objective impairments include memory, dysphagia, and dysarthria. These impairments are limiting patient from effectively communicating at home and in community and safety when swallowing. Factors affecting potential to achieve goals and functional outcome are medical prognosis. Patient will benefit from skilled SLP services to address above impairments and improve overall function.   REHAB POTENTIAL: Good   PLAN: SLP FREQUENCY: 2x/week   SLP DURATION: 12 weeks   PLANNED INTERVENTIONS: Aspiration precaution training, Pharyngeal strengthening exercises, Diet toleration management , Language facilitation, Environmental controls, Trials of upgraded texture/liquids, Cueing hierachy, Cognitive reorganization, Internal/external aids, Functional tasks, Multimodal communication approach, and SLP instruction and feedback    Su Monks, [place] 04/04/2022, 8:17 AM

## 2022-04-04 NOTE — Therapy (Signed)
OUTPATIENT PHYSICAL THERAPY NEURO TREATMENT   Patient Name: Ammiel Guiney MRN: [id number] DOB:21-May-1946, 76 y.o., male Today's Date: 04/04/2022   PCP: Cassandria Anger, MD REFERRING PROVIDER: Harvie Heck, MD    PT End of Session - 04/04/22 1019     Visit Number 11    Number of Visits 16    Date for PT Re-Evaluation 05/22/22    Authorization Type [facility] - 15 visits approved from 12/29/21 - 04/28/22    Authorization - Visit Number 10    Authorization - Number of Visits 15    PT Start Time 2130    PT Stop Time 1059    PT Time Calculation (min) 41 min    Equipment Utilized During Treatment Gait belt    Activity Tolerance Patient tolerated treatment well    Behavior During Therapy WFL for tasks assessed/performed                Past Medical History:  Diagnosis Date   Acid reflux    Alcohol abuse    quit 10 y ago   Arthritis    BPH (benign prostatic hyperplasia)    CAD (coronary artery disease)    Cervical spondylosis without myelopathy 06/02/2015   COPD (chronic obstructive pulmonary disease) ([place])    Emphysema lung ([place])    GERD (gastroesophageal reflux disease)    High cholesterol    Hypertension    Parkinson's disease ([place])    PVD (peripheral vascular disease) ([place])    Tremor 06/02/2015   Past Surgical History:  Procedure Laterality Date   TONSILLECTOMY     VASECTOMY     Patient Active Problem List   Diagnosis Date Noted   Leukopenia 02/28/2022   Acquired deformities of toe(s), unspecified, unspecified foot 11/27/2021   Asthma 11/27/2021   Cervicalgia 11/27/2021   Corns and callosities 11/27/2021   Disturbance of skin sensation 11/27/2021   Elevated prostate specific antigen (PSA) 11/27/2021   Excessive attrition of teeth 11/27/2021   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   Family history of cancer 11/27/2021   Family history of malignant neoplasm of prostate 11/27/2021   Hyperlipidemia 11/27/2021   Iron deficiency anemia, unspecified  11/27/2021   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[place]) 11/27/2021   Myalgia 11/27/2021   Nail dystrophy 11/27/2021   Benign neoplasm of other parts of mouth 11/27/2021   Open angle with borderline findings, high risk, left eye 11/27/2021   Other abnormal glucose 11/27/2021   Other reduced mobility 11/27/2021   Other secondary cataract, bilateral 11/27/2021   Pain in right thigh 11/27/2021   Pain in right knee 11/27/2021   Pain in unspecified shoulder 11/27/2021   Persons encountering health services in other specified circumstances 11/27/2021   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/27/2021   Right lower quadrant pain 11/27/2021   Tinnitus 11/27/2021   Trochanteric bursitis, right hip 11/27/2021   Unilateral primary osteoarthritis, right hip 11/27/2021   Unspecified fall, initial encounter 11/27/2021   Vitamin D deficiency, unspecified 11/27/2021   Weakness 11/27/2021   Constipation 08/28/2021   Chronic obstructive pulmonary disease ([place]) 05/31/2021   Depression 04/13/2021   Parkinson's disease ([place]) 02/28/2021   Weakness of both legs 02/01/2021   Dysphagia 02/01/2021   Weakness of right leg 01/06/2021   Lumbar radiculopathy 12/30/2020   Prostate cancer ([place]) 06/09/2020   Well adult exam 06/04/2019   BPH (benign prostatic hyperplasia) 06/04/2019   Bilateral carotid bruits 06/04/2019   Meniere disease, 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  Emphysema of lung ([place]) 06/04/2019   Spondylolisthesis at L4-L5 level 08/27/2017   Spinal stenosis in cervical region 08/27/2017   Tremor 06/02/2015   Cervical spondylosis without myelopathy 06/02/2015   Alcoholism in remission ([place]) 06/09/2014   Coronary arteriosclerosis 07/30/2009   History of colonoscopy 07/30/2009   Neutropenia ([place]) 07/30/2008   HIP PAIN 11/27/2007   Essential hypertension 07/25/2007   Atrial fibrillation ([place]) 07/25/2007   GERD 07/25/2007   ERECTILE  DYSFUNCTION 07/25/2007   LOW BACK PAIN 07/25/2007    ONSET DATE: 12/29/2021   REFERRING DIAG: G20 (ICD-10-CM) - Parkinson disease ([place])   THERAPY DIAG:  Other abnormalities of gait and mobility  Muscle weakness (generalized)  Other symptoms and signs involving the nervous system  Abnormal posture  Rationale for Evaluation and Treatment Rehabilitation  SUBJECTIVE:                                                                                                                                                                                              SUBJECTIVE STATEMENT: Reports getting faster later on in the day. No falls, but many stumbles and pt is able to catch himself.  Still does not want to try to use a rollator. Reports exercises are going so- so at home. Feeling a little lightheaded today.   Pt accompanied by: self  PERTINENT HISTORY: PMH:  neck and back pain, PD -dx in March 2022 per pt report, hx of ETOH abuse, CAD, COPD, emphysema, GERD, HTN, high cholesterol, PVD, pt with hx of being non-compliant with meds  PAIN:  Are you having pain? Yes: NPRS scale: 2-3/10 Pain location: RLE, distal to knee Pain description: Dull Aggravating factors: Depends how his body is like, pressure Relieving factors: Sitting up taller, tumeric  PRECAUTIONS: Fall  VITALS Vitals:   04/04/22 1023  BP: (!) 115/59  Pulse: 81      FALLS: Has patient fallen in last 6 months?  No falls to the ground, but stumbles all the time, can't keep count, normally can catch himself.  Had fall on 7/31 in home - first fall in which he was not able to catch himself   PLOF: Independent with community mobility with device  PATIENT GOALS Wants to get rid of the PD and go back to where he was 2-3 years ago. Get back to walking in the park.    OBJECTIVE:   TODAY'S TREATMENT:  NMR:  Pt performs PWR! Moves in standing position - performed at edge of mat with chair in front of pt as needed for  balance.    Performed x8 reps as PWR Flow Up >  Rock > Twist > Step  PT performed 6 reps with pt with giving demo/verbal cues and performed an additional 3 reps without demo cues for additional cognitive challenge. Pt needing intermittent reminder cues for [employer] and PWR Step and for sequencing.   Cues to reset with wide BOS at start of the flow with incr foot clearance and marching out deliberately vs. Small shuffled steps. Pt needing intermittent UE support for balance at chair with PWR Step for improved foot clearance.     On blue foam beam next to countertop: alternating forward stepping strategy to 2 targets x15 reps each side - beginning with single UE support > none. Pt challenged and initially fearful of this for balance. Pt needing encouragement and cues for incr foot clearance.  Then performed x15 reps each side with same progression posterior stepping strategy. Min guard as needed for balance.   Alternating forward stepping strategy against steady resistance from PT tech and then variable/random perturbations and progressing to having pt step over 2" obstacle for improved foot clearance. Pt needing to use stepping strategies at times to regain balance and min guard from therapist. 15 reps each leg.   Forward gait 115' with steady resistance from PT tech with pt using cane and PT providing min guard. Cues for posture and incr foot clearance.     Gait pattern: step through pattern, decreased arm swing- Left, decreased step length- Right, decreased step length- Left, decreased stride length, festinating, and trunk flexed Distance walked: Various clinic distances with cane.  Assistive device utilized: Single point cane Level of assistance: SBA Comments: Gait with cane during session, reviewed rocking technique initially in standing to initiate gait and using a marching turn for improved foot clearance.   PATIENT EDUCATION: Education details: Continue HEP Person educated:  Patient Education method: Explanation Education comprehension: verbalized understanding, returned demonstration, and needs further education   HOME EXERCISE PROGRAM: [email or web address], Seated and Standing PWR moves   Access Code: [email or web address] URL: [email or web address] Date: 03/05/2022 Prepared by: Mickie Bail Plaster  Exercises - Alternating Step Backward with Support  - 1 x daily - 5 x weekly - 1 sets - 10 reps - Alternating Step Forward with Support  - 1 x daily - 5 x weekly - 1 sets - 10 reps - Step Sideways  - 1 x daily - 5 x weekly - 1 sets - 10 reps - Standing Quarter Turn with Counter Support  - 1 x daily - 5 x weekly - 1 sets - 5 reps - Sit to Stand Without Arm Support  - 1 x daily - 7 x weekly - 3 sets - 10 reps    GOALS: Goals reviewed with patient? Yes  SHORT TERM GOALS: Target date: 03/21/2022  Pt will be independent with initial HEP for improved strength, balance, transfers, and gait.  Baseline: Pt not performing regularly  Goal status: IN PROGRESS  2.  Pt will verbalize tips to reduce festination/freezing with gait and turns.  Baseline: Pt unable to teach therapist tips when asked. However, when therapist attempts to educate pt, pt claims he is already integrating tips and does not need education  Goal status: IN PROGRESS  3.  Pt will verbalize understanding of local Parkinson's disease resources.  Baseline:  Goal status: IN PROGRESS  4.  Push and release test to be assessed with LTG written for improved balance recovery. Baseline: Anterior = 4-5 small steps Posterior = 5-6 small steps, min A for steadying Lateral = unable to elicit a step  in either direction.  Goal status: MET  5.   Pt will improve 5x sit<>stand with BUE support to less than or equal to 22 sec to demonstrate improved functional strength and transfer efficiency.  Baseline: 26.57 seconds with BUE support; 13.85s without UE support   Goal status: MET    LONG TERM GOALS: Target date:  04/18/2022  Pt will be independent with final HEP for improved strength, balance, transfers, and gait.  Baseline:  Goal status: INITIAL  2.   Pt will improve gait velocity to at least 2.4 ft/sec with LRAD for improved gait efficiency and safety.   Baseline: 14.96 seconds with SPC = 2.19 ft/sec Goal status: INITIAL  3.   Pt will ambulate at least 500' ft indoor and outdoor surfaces, appropriate assistive device with supervision for imrpoved community gait.   Baseline:  Goal status: INITIAL  4.  Pt will recover posterior and anterior balance in push and release test in 2-3 or less steps independently, for improved balance recovery.   Baseline: Anterior = 4-5 small steps Posterior = 5-6 small steps, min A for steadying Goal status: INITIAL  5.  Pt will improve 5x sit<>stand with BUE support to less than or equal to 11.5 sec to demonstrate improved functional strength and transfer efficiency. Baseline: 26.57 seconds with BUE support; 13.85s on 8/23  Goal status: REVISED  6.  Pt will improve cog TUG to 17 seconds or less in order to demo improved dual tasking/decr fall risk.  Baseline: 22.38 sec with SPC  Goal status: INITIAL    ASSESSMENT:  CLINICAL IMPRESSION: Performed PWR Moves Flow in standing with pt challenged by additional cognitive challenge, esp when therapist had pt doing a few reps without demo/visual cues with reminder cues as needed for sequencing. Remainder of session focused heavily on stepping strategies for balance against resistance and on a compliant surface. Pt more challenged with balance on an unlevel surface, needing min guard for tasks. Will continue per POC.   OBJECTIVE IMPAIRMENTS Abnormal gait, decreased activity tolerance, decreased balance, decreased cognition, decreased coordination, decreased endurance, decreased knowledge of use of DME, difficulty walking, decreased strength, decreased safety awareness, impaired flexibility, impaired sensation, impaired  tone, postural dysfunction, and pain.   ACTIVITY LIMITATIONS stairs, transfers, bed mobility, and locomotion level  PARTICIPATION LIMITATIONS: cleaning and community activity  PERSONAL FACTORS Behavior pattern, Past/current experiences, Time since onset of injury/illness/exacerbation, and 3+ comorbidities: neck and back pain, PD -dx in March 2022 per pt report, hx of ETOH abuse, CAD, COPD, emphysema, GERD, HTN, high cholesterol, PVD  are also affecting patient's functional outcome.   REHAB POTENTIAL: Good  CLINICAL DECISION MAKING: Evolving/moderate complexity  EVALUATION COMPLEXITY: Moderate  PLAN: PT FREQUENCY: 2x/week  PT DURATION: 12 weeks  PLANNED INTERVENTIONS: Therapeutic exercises, Therapeutic activity, Neuromuscular re-education, Balance training, Gait training, Patient/Family education, Self Care, Stair training, DME instructions, and Re-evaluation  PLAN FOR NEXT SESSION: Try multi-directional stepping with direction sheet.  Stepping strategies for balance, weight shifting/SLS, continued education on techniques for freezing/festination episodes. Cone taps on unlevel surfaces, rebounder drills, high amplitude, movement    Kymani Laursen N Evaluna Utke, PT, DPT  04/04/2022, 11:00 AM

## 2022-04-05 ENCOUNTER — Ambulatory Visit: Payer: No Typology Code available for payment source | Admitting: Physical Therapy

## 2022-04-05 ENCOUNTER — Encounter: Payer: Self-pay | Admitting: Physical Therapy

## 2022-04-05 ENCOUNTER — Ambulatory Visit: Payer: No Typology Code available for payment source | Admitting: Speech Pathology

## 2022-04-05 ENCOUNTER — Ambulatory Visit: Payer: No Typology Code available for payment source | Admitting: Occupational Therapy

## 2022-04-05 VITALS — BP 117/64 | HR 76

## 2022-04-05 DIAGNOSIS — R2689 Other abnormalities of gait and mobility: Secondary | ICD-10-CM

## 2022-04-05 DIAGNOSIS — R41844 Frontal lobe and executive function deficit: Secondary | ICD-10-CM

## 2022-04-05 DIAGNOSIS — R29818 Other symptoms and signs involving the nervous system: Secondary | ICD-10-CM

## 2022-04-05 DIAGNOSIS — R29898 Other symptoms and signs involving the musculoskeletal system: Secondary | ICD-10-CM | POA: Diagnosis not present

## 2022-04-05 DIAGNOSIS — R2681 Unsteadiness on feet: Secondary | ICD-10-CM | POA: Diagnosis not present

## 2022-04-05 DIAGNOSIS — R293 Abnormal posture: Secondary | ICD-10-CM

## 2022-04-05 DIAGNOSIS — R278 Other lack of coordination: Secondary | ICD-10-CM

## 2022-04-05 DIAGNOSIS — R41840 Attention and concentration deficit: Secondary | ICD-10-CM

## 2022-04-05 DIAGNOSIS — R471 Dysarthria and anarthria: Secondary | ICD-10-CM | POA: Diagnosis not present

## 2022-04-05 DIAGNOSIS — M6281 Muscle weakness (generalized): Secondary | ICD-10-CM

## 2022-04-05 DIAGNOSIS — R251 Tremor, unspecified: Secondary | ICD-10-CM | POA: Diagnosis not present

## 2022-04-05 NOTE — Therapy (Signed)
OUTPATIENT OCCUPATIONAL THERAPY PARKINSON'S TREATMENT  Patient Name: Lee [name] MRN: [id number] DOB:[birth year], 76 y.o., male Today's Date: 04/05/2022  PCP: Dr. Lew Dawes REFERRING PROVIDER: Dr. Landry Mellow   OT End of Session - 04/05/22 0934     Visit Number 7    Number of Visits 25    Date for OT Re-Evaluation 05/17/22    Authorization Time Period through 06/01/22- most likely approved for 15 visits still need to check    Authorization - Visit Number 7    Authorization - Number of Visits 15    OT Start Time 0932    OT Stop Time 1011    OT Time Calculation (min) 39 min    Activity Tolerance Patient tolerated treatment well    Behavior During Therapy Flat affect                   Past Medical History:  Diagnosis Date   Acid reflux    Alcohol abuse    quit 10 y ago   Arthritis    BPH (benign prostatic hyperplasia)    CAD (coronary artery disease)    Cervical spondylosis without myelopathy 06/02/2015   COPD (chronic obstructive pulmonary disease) ([facility])    Emphysema lung ([facility])    GERD (gastroesophageal reflux disease)    High cholesterol    Hypertension    Parkinson's disease ([place])    PVD (peripheral vascular disease) ([place])    Tremor 06/02/2015   Past Surgical History:  Procedure Laterality Date   TONSILLECTOMY     VASECTOMY     Patient Active Problem List   Diagnosis Date Noted   Leukopenia 02/28/2022   Acquired deformities of toe(s), unspecified, unspecified foot 11/27/2021   Asthma 11/27/2021   Cervicalgia 11/27/2021   Corns and callosities 11/27/2021   Disturbance of skin sensation 11/27/2021   Elevated prostate specific antigen (PSA) 11/27/2021   Excessive attrition of teeth 11/27/2021   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) 11/27/2021   Myalgia 11/27/2021   Nail dystrophy 11/27/2021   Benign neoplasm of other parts of mouth 11/27/2021   Open angle with borderline findings, high risk, left eye 11/27/2021   Other abnormal glucose 11/27/2021   Other reduced mobility 11/27/2021   Other secondary cataract, bilateral 11/27/2021   Pain in right thigh 11/27/2021   Pain in right knee 11/27/2021   Pain in unspecified shoulder 11/27/2021   Persons encountering health services in other specified circumstances 11/27/2021   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/27/2021   Right lower quadrant pain 11/27/2021   Tinnitus 11/27/2021   Trochanteric bursitis, right hip 11/27/2021   Unilateral primary osteoarthritis, right hip 11/27/2021   Unspecified fall, initial encounter 11/27/2021   Vitamin D deficiency, unspecified 11/27/2021   Weakness 11/27/2021   Constipation 08/28/2021   Chronic obstructive pulmonary disease ([place]) 05/31/2021   Depression 04/13/2021   Parkinson's disease ([place]) 02/28/2021   Weakness of both legs 02/01/2021   Dysphagia 02/01/2021   Weakness of right leg 01/06/2021   Lumbar radiculopathy 12/30/2020   Prostate cancer ([place]) 06/09/2020   Well adult exam 06/04/2019   BPH (benign prostatic hyperplasia) 06/04/2019   Bilateral carotid bruits 06/04/2019   Meniere disease, 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   Emphysema of lung ([place]) 06/04/2019   Spondylolisthesis  at L4-L5 level 08/27/2017   Spinal stenosis in cervical region 08/27/2017   Tremor 06/02/2015   Cervical spondylosis without myelopathy 06/02/2015   Alcoholism in remission ([place]) 06/09/2014   Coronary arteriosclerosis 07/30/2009   History of colonoscopy 07/30/2009   Neutropenia ([place]) 07/30/2008   HIP PAIN 11/27/2007   Essential hypertension 07/25/2007   Atrial fibrillation ([place]) 07/25/2007   GERD 07/25/2007   ERECTILE DYSFUNCTION 07/25/2007   LOW BACK  PAIN 07/25/2007    ONSET DATE: 02/06/22-referral date  REFERRING DIAG: Parkinson's disease; Z74.09,Z78.9 (ICD-10-CM) - Impaired mobility and ADLs   THERAPY DIAG:  Other abnormalities of gait and mobility  Muscle weakness (generalized)  Other symptoms and signs involving the nervous system  Abnormal posture  Attention and concentration deficit  Frontal lobe and executive function deficit  Other symptoms and signs involving the musculoskeletal system  Other lack of coordination  Unsteadiness on feet  SUBJECTIVE:   SUBJECTIVE STATEMENT: Nothing new, has not been flipping cards at home  Pt accompanied by: self  PERTINENT HISTORY:   Parkinson's disease--dx in March 2022 per pt report.    Other PMH:  neck and back pain, arthritis, hx of ETOH abuse, CAD, COPD, emphysema, GERD, HTN, high cholesterol, PVD, chronic vertigo per pt   PRECAUTIONS: Fall  WEIGHT BEARING RESTRICTIONS No  PAIN:  PAIN:  Are you having pain? Yes: NPRS scale: 2/10 Pain location: hip Pain description: aching Aggravating factors: walking Relieving factors: rest   FALLS: Has patient fallen in last 6 months? multiple "lost count"   pt reports that he falls daily, but can usually catch himself--bumps and bruises  LIVING ENVIRONMENT: Lives with: lives alone.   Lives in: House/apartment Stairs: Yes: Internal: ) steps; none and External: 3 steps; can reach both Has following equipment at home: Single point cane  PLOF: Independent  PATIENT GOALS move better, stay independent   HAND DOMINANCE: Right  OBJECTIVE:   TODAY'S TREATMENT: Arm bike x 6 mins level 1 for conditioning, pt maintained 40 rpm ADL strategies for opening various containers and fastening buttons with big movements with ADLS reviewed,  PWR! hands, then Flipping and dealing cards with big movements, min v.c for big movements Reviewed safety strategies for sit to stand and stepping out from chair(not crossing feet)    PATIENT  EDUCATION: Education details: ADL strategies for flipping cards with big movements, and fastening buttons and opening containers big movements . Issued PWR! Moves basic 4 seated handout- did not perform today  Person educated: Patient,  Education method: Explanation, Demonstration, Tactile cues, Verbal cues,  Education comprehension: verbalized understanding, returned demonstration, verbal cues required, tactile cues required,    HOME EXERCISE PROGRAM: 03/19/22:  PWR! Hands (basic 4) and coordination HEP with focus on large amplitude movements   03/26/22- PWR! moves supine basic 4 04/04/22- PWR moves seated, handout issued 04/05/22  ASSESSMENT: GOALS: Potential Goals reviewed with patient? Yes  SHORT TERM GOALS: Target date: 04/06/22- extended due to scheduling  Pt will be independent with PD-specific HEP.  Goal status: Met  2.  Pt will verbalize understanding of ways to decr risk of future complications and appropriate community resources. Goal status: I ongoing   3.  Baseline:Pt will report incr ease with grooming tasks and ability to use only RUE.  Goal status:met, per pt report  4.  Pt will demonstrate improved ease with fastening buttons as evidenced by decreasing 3 button/ unbutton time to:33 secs or less    Baseline: 35 Goal status: met 27.91  5 Pt  will demonstrate improved LUE functional use as evidenced by increasing box/ blocks score by 3 blocks.  Baseline: R 53, L 49 Goal status: ongoing,  RUE 49 , LUE 50  LONG TERM GOALS: Target date: 05/17/2022  Pt will verbalize understanding of adaptive strategies/AE to incr ease and safety with ADLs/IADLs.(Cutting food, opening containers) Goal status:met cutting food , opening containers  2.  Pt will demonstrate increased ease with dressing as eveidenced by decreasing PPT#4(don/ doff jacket) to 13 secs or less Baseline: 15.88sec Goal status: ongoing   3.  Pt will be able to write at least 3 sentences with good legibility and only   no significant decr. In letter size Goal status: ongoing  4.  Pt will improve balance/functional reach as shown by reaching at least 10" with each UE on standing functional reach test.  Baseline: R-8", L-9" Goal status: INITIAL  CLINICAL IMPRESSION: Pt is progressing towards goals. He demonstrates understanding of opening containers with adapted strategy/ big movements. Pt has met several short term goals and 1 long term goal.  PERFORMANCE DEFICITS in functional skills including ADLs, IADLs, coordination, dexterity, tone, ROM, FMC, GMC, mobility, balance, decreased knowledge of precautions, decreased knowledge of use of DME, and UE functional use, cognitive skills including attention and memory, and psychosocial skills including habits and routines and behaviors.   IMPAIRMENTS are limiting patient from ADLs and IADLs.   COMORBIDITIES has co-morbidities such as CAD, COPD, HTN, hx of neck and back pain  that affects occupational performance. Patient will benefit from skilled OT to address above impairments and improve overall function.  MODIFICATION OR ASSISTANCE TO COMPLETE EVALUATION: Min-Moderate modification of tasks or assist with assess necessary to complete an evaluation.  OT OCCUPATIONAL PROFILE AND HISTORY: Detailed assessment: Review of records and additional review of physical, cognitive, psychosocial history related to current functional performance.  CLINICAL DECISION MAKING: Moderate - several treatment options, min-mod task modification necessary  REHAB POTENTIAL: Good  EVALUATION COMPLEXITY: Moderate      PLAN: OT FREQUENCY: 2x/week  OT DURATION: 12 weeks, may modify due to scheduling/progress  PLANNED INTERVENTIONS: self care/ADL training, therapeutic exercise, therapeutic activity, neuromuscular re-education, manual therapy, passive range of motion, balance training, functional mobility training, aquatic therapy, ultrasound, paraffin, fluidotherapy, moist heat,  cryotherapy, patient/family education, cognitive remediation/compensation, visual/perceptual remediation/compensation, energy conservation, coping strategies training, and DME and/or AE instructions  RECOMMENDED OTHER SERVICES: scheduled for evals with PT and ST  CONSULTED AND AGREED WITH PLAN OF CARE: Patient  PLAN FOR NEXT SESSION:  community resources/ prevent future complications, donn/doff jacket,  work towards unmet goals, functional reaching with large amplitude movements.   Jedd Schulenburg, OTR/L 04/05/2022, 10:17 AM  [facility] [place], [place]  [id number] [phone] phone [phone] 04/05/22 10:17 AM

## 2022-04-05 NOTE — Therapy (Unsigned)
OUTPATIENT PHYSICAL THERAPY NEURO TREATMENT   Patient Name: Lee [name] MRN: [id number] DOB:April 26, 1946, 76 y.o., male Today's Date: 04/05/2022   PCP: Cassandria Anger, MD REFERRING PROVIDER: Harvie Heck, MD    PT End of Session - 04/05/22 1020     Visit Number 12    Number of Visits 16    Date for PT Re-Evaluation 05/22/22    Authorization Type [facility] - 15 visits approved from 12/29/21 - 04/28/22    Authorization - Visit Number 11    Authorization - Number of Visits 15    PT Stop Time [place] During Treatment Gait belt    Activity Tolerance Patient tolerated treatment well    Behavior During Therapy [facility] for tasks assessed/performed                Past Medical History:  Diagnosis Date   Acid reflux    Alcohol abuse    quit 10 y ago   Arthritis    BPH (benign prostatic hyperplasia)    CAD (coronary artery disease)    Cervical spondylosis without myelopathy 06/02/2015   COPD (chronic obstructive pulmonary disease) ([place])    Emphysema lung ([place])    GERD (gastroesophageal reflux disease)    High cholesterol    Hypertension    Parkinson's disease ([place])    PVD (peripheral vascular disease) ([place])    Tremor 06/02/2015   Past Surgical History:  Procedure Laterality Date   TONSILLECTOMY     VASECTOMY     Patient Active Problem List   Diagnosis Date Noted   Leukopenia 02/28/2022   Acquired deformities of toe(s), unspecified, unspecified foot 11/27/2021   Asthma 11/27/2021   Cervicalgia 11/27/2021   Corns and callosities 11/27/2021   Disturbance of skin sensation 11/27/2021   Elevated prostate specific antigen (PSA) 11/27/2021   Excessive attrition of teeth 11/27/2021   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   Family history of cancer 11/27/2021   Family history of malignant neoplasm of prostate 11/27/2021   Hyperlipidemia 11/27/2021   Iron deficiency anemia, unspecified 11/27/2021   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[place])  11/27/2021   Myalgia 11/27/2021   Nail dystrophy 11/27/2021   Benign neoplasm of other parts of mouth 11/27/2021   Open angle with borderline findings, high risk, left eye 11/27/2021   Other abnormal glucose 11/27/2021   Other reduced mobility 11/27/2021   Other secondary cataract, bilateral 11/27/2021   Pain in right thigh 11/27/2021   Pain in right knee 11/27/2021   Pain in unspecified shoulder 11/27/2021   Persons encountering health services in other specified circumstances 11/27/2021   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/27/2021   Right lower quadrant pain 11/27/2021   Tinnitus 11/27/2021   Trochanteric bursitis, right hip 11/27/2021   Unilateral primary osteoarthritis, right hip 11/27/2021   Unspecified fall, initial encounter 11/27/2021   Vitamin D deficiency, unspecified 11/27/2021   Weakness 11/27/2021   Constipation 08/28/2021   Chronic obstructive pulmonary disease ([place]) 05/31/2021   Depression 04/13/2021   Parkinson's disease ([place]) 02/28/2021   Weakness of both legs 02/01/2021   Dysphagia 02/01/2021   Weakness of right leg 01/06/2021   Lumbar radiculopathy 12/30/2020   Prostate cancer ([place]) 06/09/2020   Well adult exam 06/04/2019   BPH (benign prostatic hyperplasia) 06/04/2019   Bilateral carotid bruits 06/04/2019   Meniere disease, 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   Emphysema of lung ([place]) 06/04/2019   Spondylolisthesis at L4-L5 level 08/27/2017  Spinal stenosis in cervical region 08/27/2017   Tremor 06/02/2015   Cervical spondylosis without myelopathy 06/02/2015   Alcoholism in remission ([place]) 06/09/2014   Coronary arteriosclerosis 07/30/2009   History of colonoscopy 07/30/2009   Neutropenia ([place]) 07/30/2008   HIP PAIN 11/27/2007   Essential hypertension 07/25/2007   Atrial fibrillation ([place]) 07/25/2007   GERD 07/25/2007   ERECTILE DYSFUNCTION 07/25/2007   LOW BACK PAIN 07/25/2007     ONSET DATE: 12/29/2021   REFERRING DIAG: G20 (ICD-10-CM) - Parkinson disease ([place])   THERAPY DIAG:  Other abnormalities of gait and mobility  Muscle weakness (generalized)  Abnormal posture  Other symptoms and signs involving the nervous system  Rationale for Evaluation and Treatment Rehabilitation  PERTINENT HISTORY: PMH:  neck and back pain, PD -dx in March 2022 per pt report, hx of ETOH abuse, CAD, COPD, emphysema, GERD, HTN, high cholesterol, PVD, pt with hx of being non-compliant with meds  PATIENT GOALS Wants to get rid of the PD and go back to where he was 2-3 years ago. Get back to walking in the park.    PRECAUTIONS: Fall   SUBJECTIVE: No new complaints. No falls.                                                                                                                                                                                           Pt accompanied by: self   PAIN:  Are you having pain? Yes: NPRS scale: 3/10 Pain location: RLE, distal to knee Pain description: Dull Aggravating factors: Depends how his body is like, pressure Relieving factors: Sitting up taller, tumeric    VITALS Vitals:   04/05/22 1025  BP: 117/64  Pulse: 76        TODAY'S TREATMENT:  BALANCE/NMR:  Multidirectional stepping- floor, then on red mat with direction sheet taped to back of mirror. Cues on large movements with intention in the steps. Performed for several reps on solid floor, then on red mat. SBA to CGA for safety.   Stepping Strategy: standing across red foam beam with no UE support, occasional touch to bars: alternating forward stepping to floor/back onto beam, then alternating backward stepping to floor/back onto beam for ~10 reps each, each way. Rockerboard: on balance board in both ways with no UE support- holding the board steady for EC 30 seconds x 3 reps, then rocking the board with EO for 10 reps. Cues for tall posture and weight shifting.        GAIT: Gait pattern: step through pattern, decreased arm swing- Left, decreased step length- Right, decreased step length- Left, decreased stride length, festinating, and trunk flexed  Distance walked: Various clinic distances with cane.  Assistive device utilized: Single point cane Level of assistance: SBA Comments:cues for equal step length and arm swing   PATIENT EDUCATION: Education details: Continue HEP Person educated: Patient Education method: Explanation Education comprehension: verbalized understanding, returned demonstration, and needs further education   HOME EXERCISE PROGRAM: [email or web address], Seated and Standing PWR moves   Access Code: [email or web address] URL: [email or web address] Date: 03/05/2022 Prepared by: Mickie Bail Plaster  Exercises - Alternating Step Backward with Support  - 1 x daily - 5 x weekly - 1 sets - 10 reps - Alternating Step Forward with Support  - 1 x daily - 5 x weekly - 1 sets - 10 reps - Step Sideways  - 1 x daily - 5 x weekly - 1 sets - 10 reps - Standing Quarter Turn with Counter Support  - 1 x daily - 5 x weekly - 1 sets - 5 reps - Sit to Stand Without Arm Support  - 1 x daily - 7 x weekly - 3 sets - 10 reps    GOALS: Goals reviewed with patient? Yes  SHORT TERM GOALS: Target date: 03/21/2022  Pt will be independent with initial HEP for improved strength, balance, transfers, and gait.  Baseline: Pt not performing regularly  Goal status: IN PROGRESS  2.  Pt will verbalize tips to reduce festination/freezing with gait and turns.  Baseline: Pt unable to teach therapist tips when asked. However, when therapist attempts to educate pt, pt claims he is already integrating tips and does not need education  Goal status: IN PROGRESS  3.  Pt will verbalize understanding of local Parkinson's disease resources.  Baseline:  Goal status: IN PROGRESS  4.  Push and release test to be assessed with LTG written for improved balance  recovery. Baseline: Anterior = 4-5 small steps Posterior = 5-6 small steps, min A for steadying Lateral = unable to elicit a step in either direction.  Goal status: MET  5.   Pt will improve 5x sit<>stand with BUE support to less than or equal to 22 sec to demonstrate improved functional strength and transfer efficiency.  Baseline: 26.57 seconds with BUE support; 13.85s without UE support   Goal status: MET    LONG TERM GOALS: Target date: 04/18/2022  Pt will be independent with final HEP for improved strength, balance, transfers, and gait.  Baseline:  Goal status: INITIAL  2.   Pt will improve gait velocity to at least 2.4 ft/sec with LRAD for improved gait efficiency and safety.   Baseline: 14.96 seconds with SPC = 2.19 ft/sec Goal status: INITIAL  3.   Pt will ambulate at least 500' ft indoor and outdoor surfaces, appropriate assistive device with supervision for imrpoved community gait.   Baseline:  Goal status: INITIAL  4.  Pt will recover posterior and anterior balance in push and release test in 2-3 or less steps independently, for improved balance recovery.   Baseline: Anterior = 4-5 small steps Posterior = 5-6 small steps, min A for steadying Goal status: INITIAL  5.  Pt will improve 5x sit<>stand with BUE support to less than or equal to 11.5 sec to demonstrate improved functional strength and transfer efficiency. Baseline: 26.57 seconds with BUE support; 13.85s on 8/23  Goal status: REVISED  6.  Pt will improve cog TUG to 17 seconds or less in order to demo improved dual tasking/decr fall risk.  Baseline: 22.38 sec with SPC  Goal status: INITIAL    ASSESSMENT:  CLINICAL IMPRESSION:  Today's skilled session continued to focus on balance training and large amplitude movements. No issues noted or reported in session. The pt is making steady progress and should benefit from continued PT to progress toward unmet goals goals.   OBJECTIVE IMPAIRMENTS Abnormal gait,  decreased activity tolerance, decreased balance, decreased cognition, decreased coordination, decreased endurance, decreased knowledge of use of DME, difficulty walking, decreased strength, decreased safety awareness, impaired flexibility, impaired sensation, impaired tone, postural dysfunction, and pain.   ACTIVITY LIMITATIONS stairs, transfers, bed mobility, and locomotion level  PARTICIPATION LIMITATIONS: cleaning and community activity  PERSONAL FACTORS Behavior pattern, Past/current experiences, Time since onset of injury/illness/exacerbation, and 3+ comorbidities: neck and back pain, PD -dx in March 2022 per pt report, hx of ETOH abuse, CAD, COPD, emphysema, GERD, HTN, high cholesterol, PVD  are also affecting patient's functional outcome.   REHAB POTENTIAL: Good  CLINICAL DECISION MAKING: Evolving/moderate complexity  EVALUATION COMPLEXITY: Moderate  PLAN: PT FREQUENCY: 2x/week  PT DURATION: 12 weeks  PLANNED INTERVENTIONS: Therapeutic exercises, Therapeutic activity, Neuromuscular re-education, Balance training, Gait training, Patient/Family education, Self Care, Stair training, DME instructions, and Re-evaluation  PLAN FOR NEXT SESSION:  Try multi-directional stepping with direction sheet.  Stepping strategies for balance, weight shifting/SLS, continued education on techniques for freezing/festination episodes. Cone taps on unlevel surfaces, rebounder drills, high amplitude, movement    [place], [place], [facility] [address], [place], [place] [address] [phone] 04/05/22, 10:26 AM

## 2022-04-05 NOTE — Therapy (Signed)
OUTPATIENT SPEECH LANGUAGE PATHOLOGY TREATMENT NOTE   Patient Name: Lee [name] MRN: [id number] DOB:[birth year], 76 y.o., male Today's Date: 04/05/2022  PCP: Dr. Lew Dawes  REFERRING PROVIDER: Harvie Heck, MD   END OF SESSION:   End of Session - 04/05/22 0847     Visit Number 10    Number of Visits 25    Date for SLP Re-Evaluation 05/16/22    SLP Start Time 0847    SLP Stop Time  0930    SLP Time Calculation (min) 43 min    Activity Tolerance Patient tolerated treatment well                 Past Medical History:  Diagnosis Date   Acid reflux    Alcohol abuse    quit 10 y ago   Arthritis    BPH (benign prostatic hyperplasia)    CAD (coronary artery disease)    Cervical spondylosis without myelopathy 06/02/2015   COPD (chronic obstructive pulmonary disease) ([place])    Emphysema lung ([place])    GERD (gastroesophageal reflux disease)    High cholesterol    Hypertension    Parkinson's disease ([place])    PVD (peripheral vascular disease) ([place])    Tremor 06/02/2015   Past Surgical History:  Procedure Laterality Date   TONSILLECTOMY     VASECTOMY     Patient Active Problem List   Diagnosis Date Noted   Leukopenia 02/28/2022   Acquired deformities of toe(s), unspecified, unspecified foot 11/27/2021   Asthma 11/27/2021   Cervicalgia 11/27/2021   Corns and callosities 11/27/2021   Disturbance of skin sensation 11/27/2021   Elevated prostate specific antigen (PSA) 11/27/2021   Excessive attrition of teeth 11/27/2021   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   Family history of cancer 11/27/2021   Family history of malignant neoplasm of prostate 11/27/2021   Hyperlipidemia 11/27/2021   Iron deficiency anemia, unspecified 11/27/2021   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[place]) 11/27/2021   Myalgia 11/27/2021   Nail dystrophy 11/27/2021   Benign neoplasm of other parts of mouth 11/27/2021   Open angle with borderline findings, high risk, left eye  11/27/2021   Other abnormal glucose 11/27/2021   Other reduced mobility 11/27/2021   Other secondary cataract, bilateral 11/27/2021   Pain in right thigh 11/27/2021   Pain in right knee 11/27/2021   Pain in unspecified shoulder 11/27/2021   Persons encountering health services in other specified circumstances 11/27/2021   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/27/2021   Right lower quadrant pain 11/27/2021   Tinnitus 11/27/2021   Trochanteric bursitis, right hip 11/27/2021   Unilateral primary osteoarthritis, right hip 11/27/2021   Unspecified fall, initial encounter 11/27/2021   Vitamin D deficiency, unspecified 11/27/2021   Weakness 11/27/2021   Constipation 08/28/2021   Chronic obstructive pulmonary disease ([place]) 05/31/2021   Depression 04/13/2021   Parkinson's disease ([place]) 02/28/2021   Weakness of both legs 02/01/2021   Dysphagia 02/01/2021   Weakness of right leg 01/06/2021   Lumbar radiculopathy 12/30/2020   Prostate cancer ([place]) 06/09/2020   Well adult exam 06/04/2019   BPH (benign prostatic hyperplasia) 06/04/2019   Bilateral carotid bruits 06/04/2019   Meniere disease, 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   Emphysema of lung ([place]) 06/04/2019   Spondylolisthesis at L4-L5 level 08/27/2017   Spinal stenosis in cervical region 08/27/2017   Tremor 06/02/2015   Cervical spondylosis without myelopathy 06/02/2015   Alcoholism in remission ([place]) 06/09/2014   Coronary arteriosclerosis 07/30/2009  History of colonoscopy 07/30/2009   Neutropenia ([place]) 07/30/2008   HIP PAIN 11/27/2007   Essential hypertension 07/25/2007   Atrial fibrillation ([place]) 07/25/2007   GERD 07/25/2007   ERECTILE DYSFUNCTION 07/25/2007   LOW BACK PAIN 07/25/2007    ONSET DATE: March 2022 PD dx  REFERRING DIAG:  R13.10 (ICD-10-CM) - Dysphagia   THERAPY DIAG:  Dysarthria and anarthria  Rationale for Evaluation and Treatment  Rehabilitation  SUBJECTIVE: Pt reporting to fatigue d/t appointment timing and when he awoke this morning.     PAIN:  Are you having pain? Yes  4: Headache  Speech Therapy Progress Note  Dates of Reporting Period: 02-21-22 to 04-05-22  Objective Reports of Subjective Statement: Pt seen for 10 visits targeting improved speech clarity and increased intensity per hypokinetic dysarthria. Pt reports mild improvement in communication efficacy since initiation of speech therapy when communicating out in community.   Objective Measurements: Moderate improvement in vocal intensity during structured speech exercises, averaging 70+ dB during cognitive speech level exercises with pt generating content based on given prompt. Use of Speak Out! program. Mild improvement observed at spontaneous conversational level with decreasing verbal cues following structured practice.   Goal Update: see below  Plan: continue per POC  Reason Skilled Services are Required: To optimize communication efficacy across communication partners.   OBJECTIVE:   TODAY'S TREATMENT:   04-05-22: Target improving clarity of speech and vocal intensity using Speak Out! lesson 15. Usual, faded to rare, min-A to meet desired dB levels and complete exercises accurately during structured exercises. Averages as follows: 87 dB loud "ah"; counting 83 dB; reading 73 dB; cognitive exercise 64 dB. Usual max-A to use of intensity across cognitive exercise trials. Following structured practice, limited carryover exhibited. Pt averages 65 dB with usual verbal cues in short conversational exchange. Pt observed to self-correct x2 when losing vocal intensity. Pt attributing low volume in conversation to being fatigued.   04-04-22: Pt enters with suboptimal volume (ranging from 64-73 dB, averaging 67 dB over 5 minutes) and imprecise articulation impacting overall intelligibility, resulting in SLP having to ask for repetition x6 in opening conversation. Pt  attempting to self correct through using intentional speech with intermittent success. Pt reporting ongoing fatigue and "depression" d/t lack of progress with Parkinson's management, tells SLP "I'm doing what I can." SLP provides education on requirement to use intentional speech to aid in speech clarity and intensity, advises pt he will always need to use intent d/t nature of Parkinson's, dopamine impact and it's role in automatic movements. Pt verbalizes understanding, is able to teach back what will improve his clarity with min-A. Target improving clarity of speech and vocal intensity using Speak Out! lesson 15. Usual, faded to rare, min-A to meet desired dB levels and complete exercises accurately during structured exercises. Averages as follows: 97 dB loud "ah"; counting 84 dB; reading 77 dB; cognitive exercise 69 dB. Decreased volume noted for cognitive exercise 2/2 difficulty understanding task, with some scaling volume increasing to 71 dB. Pt verbalizes appreciation for SLP feedback and encouragement throughout session.   PATIENT EDUCATION: Education details: See above (and patient instructions) Person educated: Patient Education method: Explanation, Demonstration, Verbal cues, and Handouts Education comprehension: verbalized understanding, returned demonstration, verbal cues required, and needs further education     HOME EXERCISE PROGRAM: Speak Out     GOALS: Goals reviewed with patient? Yes   SHORT TERM GOALS: Target date: 03/26/22 Pt will complete HEP for dysarthria with occasional min A over 2 sessions Baseline:  Goal status: Not met   2.  Pt will average 70dB 18/20 sentences with rare min A over 2 sessions Baseline: 03-07-22 Goal status: Met   3.  Pt will average 70dB over 8 minute simple conversation with occasional min A over 2 sessions Baseline:  Goal status: Met   4.  Pt will complete MBSS Baseline:  Goal status: Met   5.  Pt will follow diet modifications and swallow  precautions after MBSS with occasional min A Baseline:  Goal status: deferred      LONG TERM GOALS: Target date: 05/16/22   Pt will complete HEP for dysarthria with rare min A over 2 sessions Baseline:  Goal status: ongoing   2.  Pt will complete HEP for dysphagia if indicated s/p MBSS with occasional min A Baseline:  Goal status: deferred, WFL swallow   3.  Pt will average 70 dB during 15 minute conversation with rare min A over 2 sessions Baseline:  Goal status: ongoing   4.  Pt will improve score on Communicative Effectiveness Survey Baseline: 13/32 Goal status: ongoing      ASSESSMENT:   CLINICAL IMPRESSION: Patient is a 76 y.o. male who was seen today for dysphagia and dysarthria. Conducted ongoing education and training of recommended swallow precautions to optimize safety during swallowing as well as Speak Out! Program to maximize patient vocal intensity and clarity. Recommend skilled ST to maximize intelligibility for safety, QOL and safety of swallow.    OBJECTIVE IMPAIRMENTS  Objective impairments include memory, dysphagia, and dysarthria. These impairments are limiting patient from effectively communicating at home and in community and safety when swallowing. Factors affecting potential to achieve goals and functional outcome are medical prognosis. Patient will benefit from skilled SLP services to address above impairments and improve overall function.   REHAB POTENTIAL: Good   PLAN: SLP FREQUENCY: 2x/week   SLP DURATION: 12 weeks   PLANNED INTERVENTIONS: Aspiration precaution training, Pharyngeal strengthening exercises, Diet toleration management , Language facilitation, Environmental controls, Trials of upgraded texture/liquids, Cueing hierachy, Cognitive reorganization, Internal/external aids, Functional tasks, Multimodal communication approach, and SLP instruction and feedback    Su Monks, [place] 04/05/2022, 8:47 AM

## 2022-04-08 NOTE — Therapy (Signed)
OUTPATIENT OCCUPATIONAL THERAPY PARKINSON'S TREATMENT  Patient Name: Lee [name] MRN: [id number] DOB:[birth year], 76 y.o., male Today's Date: 04/09/2022  PCP: Dr. Lew Dawes REFERRING PROVIDER: Dr. Landry Mellow   OT End of Session - 04/09/22 1012     Visit Number 8    Number of Visits 25    Date for OT Re-Evaluation 05/17/22    Authorization Time Period through 06/01/22- most likely approved for 15 visits still need to check    Authorization - Visit Number 8    Authorization - Number of Visits 15    Progress Note Due on Visit 10    OT Start Time 1019    OT Stop Time 1100    OT Time Calculation (min) 41 min    Activity Tolerance Patient tolerated treatment well    Behavior During Therapy [facility] for tasks assessed/performed                    Past Medical History:  Diagnosis Date   Acid reflux    Alcohol abuse    quit 10 y ago   Arthritis    BPH (benign prostatic hyperplasia)    CAD (coronary artery disease)    Cervical spondylosis without myelopathy 06/02/2015   COPD (chronic obstructive pulmonary disease) ([place])    Emphysema lung ([place])    GERD (gastroesophageal reflux disease)    High cholesterol    Hypertension    Parkinson's disease ([place])    PVD (peripheral vascular disease) ([place])    Tremor 06/02/2015   Past Surgical History:  Procedure Laterality Date   TONSILLECTOMY     VASECTOMY     Patient Active Problem List   Diagnosis Date Noted   Leukopenia 02/28/2022   Acquired deformities of toe(s), unspecified, unspecified foot 11/27/2021   Asthma 11/27/2021   Cervicalgia 11/27/2021   Corns and callosities 11/27/2021   Disturbance of skin sensation 11/27/2021   Elevated prostate specific antigen (PSA) 11/27/2021   Excessive attrition of teeth 11/27/2021   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   Family history of cancer 11/27/2021   Family history of malignant neoplasm of prostate 11/27/2021   Hyperlipidemia 11/27/2021   Iron deficiency  anemia, unspecified 11/27/2021   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[place]) 11/27/2021   Myalgia 11/27/2021   Nail dystrophy 11/27/2021   Benign neoplasm of other parts of mouth 11/27/2021   Open angle with borderline findings, high risk, left eye 11/27/2021   Other abnormal glucose 11/27/2021   Other reduced mobility 11/27/2021   Other secondary cataract, bilateral 11/27/2021   Pain in right thigh 11/27/2021   Pain in right knee 11/27/2021   Pain in unspecified shoulder 11/27/2021   Persons encountering health services in other specified circumstances 11/27/2021   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/27/2021   Right lower quadrant pain 11/27/2021   Tinnitus 11/27/2021   Trochanteric bursitis, right hip 11/27/2021   Unilateral primary osteoarthritis, right hip 11/27/2021   Unspecified fall, initial encounter 11/27/2021   Vitamin D deficiency, unspecified 11/27/2021   Weakness 11/27/2021   Constipation 08/28/2021   Chronic obstructive pulmonary disease ([place]) 05/31/2021   Depression 04/13/2021   Parkinson's disease ([place]) 02/28/2021   Weakness of both legs 02/01/2021   Dysphagia 02/01/2021   Weakness of right leg 01/06/2021   Lumbar radiculopathy 12/30/2020   Prostate cancer ([place]) 06/09/2020   Well adult exam 06/04/2019   BPH (benign prostatic hyperplasia) 06/04/2019   Bilateral carotid bruits 06/04/2019   Meniere disease,  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   Emphysema of lung ([place]) 06/04/2019   Spondylolisthesis at L4-L5 level 08/27/2017   Spinal stenosis in cervical region 08/27/2017   Tremor 06/02/2015   Cervical spondylosis without myelopathy 06/02/2015   Alcoholism in remission ([place]) 06/09/2014   Coronary arteriosclerosis 07/30/2009   History of colonoscopy 07/30/2009   Neutropenia ([place]) 07/30/2008   HIP PAIN 11/27/2007   Essential hypertension 07/25/2007   Atrial fibrillation ([place]) 07/25/2007   GERD  07/25/2007   ERECTILE DYSFUNCTION 07/25/2007   LOW BACK PAIN 07/25/2007    ONSET DATE: 02/06/22-referral date  REFERRING DIAG: Parkinson's disease; Z74.09,Z78.9 (ICD-10-CM) - Impaired mobility and ADLs   THERAPY DIAG:  Other symptoms and signs involving the nervous system  Other symptoms and signs involving the musculoskeletal system  Other lack of coordination  Unsteadiness on feet  Attention and concentration deficit  Frontal lobe and executive function deficit  Tremor  Abnormal posture  SUBJECTIVE:   SUBJECTIVE STATEMENT: Feels a little lightheaded--"it's all the time" (also see PT note)  Pt accompanied by: self  PERTINENT HISTORY:   Parkinson's disease--dx in March 2022 per pt report.    Other PMH:  neck and back pain, arthritis, hx of ETOH abuse, CAD, COPD, emphysema, GERD, HTN, high cholesterol, PVD, chronic vertigo per pt   PRECAUTIONS: Fall  WEIGHT BEARING RESTRICTIONS No  PAIN:  PAIN:  Are you having pain? Yes: NPRS scale: 3/10 Pain location: hip Pain description: aching Aggravating factors: walking Relieving factors: rest   FALLS: Has patient fallen in last 6 months? multiple "lost count"   pt reports that he falls daily, but can usually catch himself--bumps and bruises  LIVING ENVIRONMENT: Lives with: lives alone.   Lives in: House/apartment Stairs: Yes: Internal: ) steps; none and External: 3 steps; can reach both Has following equipment at home: Single point cane  PLOF: Independent  PATIENT GOALS move better, stay independent   HAND DOMINANCE: Right  OBJECTIVE:   TODAY'S TREATMENT:  In sitting, functional reaching in ER and overhead (set-up for large amplitude) with each UE to place large pegs in vertical pegboard incorporating wt. Shift, trunk rotation, and PWR! Hand with incr coordination.    PWR! Moves in sitting (up, rock, twist) x10 to each side with min cueing for large amplitude movements.  Pt instructed in large amplitude  movement strategies for donning/doffing jacket and returned demo x2 with min cueing initially with incr ease.   PATIENT EDUCATION: Education details: Reviewed Appropriate PD [employer] (issued by PT); Ways to decr risk of future complications; Tlarge amplitude movement strategies for donning/doffing jacket iming of medications and meals (info from [facility]);  Person educated: Patient Education method: Explanation, Handout Education comprehension: verbalized understanding  HOME EXERCISE PROGRAM: 03/19/22:  PWR! Hands (basic 4) and coordination HEP with focus on large amplitude movements   03/26/22- PWR! moves supine basic 4 04/04/22- PWR moves seated, handout issued 04/05/22 04/05/22:  ADL strategies for flipping cards with big movements, and fastening buttons and opening containers big movements . Issued PWR! Moves basic 4 seated handout- did not perform today  04/09/22:  Reviewed Appropriate PD [employer] (issued by PT); Ways to decr risk of future complications; Timing of medications and meals (info from [facility]); large amplitude movement strategies for donning/doffing jacket    ASSESSMENT: GOALS: Potential Goals reviewed with patient? Yes  SHORT TERM GOALS: Target date: 04/06/22- extended due to scheduling  Pt will be independent with PD-specific HEP.  Goal status: Met  2.  Pt will verbalize  understanding of ways to decr risk of future complications and appropriate community resources. Goal status: I ongoing   3.  Baseline:Pt will report incr ease with grooming tasks and ability to use only RUE.  Goal status:met, per pt report  4.  Pt will demonstrate improved ease with fastening buttons as evidenced by decreasing 3 button/ unbutton time to:33 secs or less    Baseline: 35 Goal status: met 27.91  5 Pt will demonstrate improved LUE functional use as evidenced by increasing box/ blocks score by 3 blocks.  Baseline: R 53, L 49 Goal status:  ongoing,  RUE 49 , LUE 50  LONG TERM GOALS: Target date: 05/17/2022  Pt will verbalize understanding of adaptive strategies/AE to incr ease and safety with ADLs/IADLs.(Cutting food, opening containers) Goal status:met cutting food , opening containers  2.  Pt will demonstrate increased ease with dressing as eveidenced by decreasing PPT#4(don/ doff jacket) to 13 secs or less Baseline: 15.88sec Goal status: ongoing   3.  Pt will be able to write at least 3 sentences with good legibility and only  no significant decr. In letter size Goal status: ongoing  4.  Pt will improve balance/functional reach as shown by reaching at least 10" with each UE on standing functional reach test.  Baseline: R-8", L-9" Goal status: INITIAL  CLINICAL IMPRESSION: Pt is progressing towards goals. Pt able to demo donning/doffing jacket with large amplitude movements after initial instruction and with incr ease.    PERFORMANCE DEFICITS in functional skills including ADLs, IADLs, coordination, dexterity, tone, ROM, FMC, GMC, mobility, balance, decreased knowledge of precautions, decreased knowledge of use of DME, and UE functional use, cognitive skills including attention and memory, and psychosocial skills including habits and routines and behaviors.   IMPAIRMENTS are limiting patient from ADLs and IADLs.   COMORBIDITIES has co-morbidities such as CAD, COPD, HTN, hx of neck and back pain  that affects occupational performance. Patient will benefit from skilled OT to address above impairments and improve overall function.  MODIFICATION OR ASSISTANCE TO COMPLETE EVALUATION: Min-Moderate modification of tasks or assist with assess necessary to complete an evaluation.  OT OCCUPATIONAL PROFILE AND HISTORY: Detailed assessment: Review of records and additional review of physical, cognitive, psychosocial history related to current functional performance.  CLINICAL DECISION MAKING: Moderate - several treatment options,  min-mod task modification necessary  REHAB POTENTIAL: Good  EVALUATION COMPLEXITY: Moderate   PLAN: OT FREQUENCY: 2x/week  OT DURATION: 12 weeks, may modify due to scheduling/progress  PLANNED INTERVENTIONS: self care/ADL training, therapeutic exercise, therapeutic activity, neuromuscular re-education, manual therapy, passive range of motion, balance training, functional mobility training, aquatic therapy, ultrasound, paraffin, fluidotherapy, moist heat, cryotherapy, patient/family education, cognitive remediation/compensation, visual/perceptual remediation/compensation, energy conservation, coping strategies training, and DME and/or AE instructions  RECOMMENDED OTHER SERVICES: scheduled for evals with PT and ST  CONSULTED AND AGREED WITH PLAN OF CARE: Patient  PLAN FOR NEXT SESSION:  work towards unmet goals, functional reaching with large amplitude movements.   [facility], OTR/L 04/09/2022, 10:31 AM  [facility] [address] [place], [place]  [address] [phone] phone [phone] 04/09/22 10:31 AM

## 2022-04-09 ENCOUNTER — Ambulatory Visit: Payer: No Typology Code available for payment source | Admitting: Occupational Therapy

## 2022-04-09 ENCOUNTER — Ambulatory Visit: Payer: No Typology Code available for payment source | Admitting: Physical Therapy

## 2022-04-09 ENCOUNTER — Ambulatory Visit: Payer: No Typology Code available for payment source | Admitting: Speech Pathology

## 2022-04-09 ENCOUNTER — Encounter: Payer: Self-pay | Admitting: Occupational Therapy

## 2022-04-09 ENCOUNTER — Encounter: Payer: Self-pay | Admitting: Speech Pathology

## 2022-04-09 ENCOUNTER — Encounter: Payer: Self-pay | Admitting: Physical Therapy

## 2022-04-09 VITALS — BP 117/62 | HR 79

## 2022-04-09 DIAGNOSIS — M6281 Muscle weakness (generalized): Secondary | ICD-10-CM | POA: Diagnosis not present

## 2022-04-09 DIAGNOSIS — R471 Dysarthria and anarthria: Secondary | ICD-10-CM

## 2022-04-09 DIAGNOSIS — R293 Abnormal posture: Secondary | ICD-10-CM

## 2022-04-09 DIAGNOSIS — R2689 Other abnormalities of gait and mobility: Secondary | ICD-10-CM

## 2022-04-09 DIAGNOSIS — R29818 Other symptoms and signs involving the nervous system: Secondary | ICD-10-CM | POA: Diagnosis not present

## 2022-04-09 DIAGNOSIS — R29898 Other symptoms and signs involving the musculoskeletal system: Secondary | ICD-10-CM

## 2022-04-09 DIAGNOSIS — R2681 Unsteadiness on feet: Secondary | ICD-10-CM

## 2022-04-09 DIAGNOSIS — R251 Tremor, unspecified: Secondary | ICD-10-CM | POA: Diagnosis not present

## 2022-04-09 DIAGNOSIS — R278 Other lack of coordination: Secondary | ICD-10-CM

## 2022-04-09 DIAGNOSIS — R41840 Attention and concentration deficit: Secondary | ICD-10-CM

## 2022-04-09 DIAGNOSIS — R41844 Frontal lobe and executive function deficit: Secondary | ICD-10-CM

## 2022-04-09 NOTE — Therapy (Signed)
OUTPATIENT SPEECH LANGUAGE PATHOLOGY TREATMENT NOTE   Patient Name: Lee [name] MRN: [id number] DOB:10/20/1945, 76 y.o., male Today's Date: 04/09/2022  PCP: Dr. Lew Dawes  REFERRING PROVIDER: Harvie Heck, MD   END OF SESSION:   End of Session - 04/09/22 1108     Visit Number 11    Number of Visits 25    Date for SLP Re-Evaluation 05/16/22    SLP Start Time 1104    SLP Stop Time  9629    SLP Time Calculation (min) 41 min    Activity Tolerance Patient tolerated treatment well                 Past Medical History:  Diagnosis Date   Acid reflux    Alcohol abuse    quit 10 y ago   Arthritis    BPH (benign prostatic hyperplasia)    CAD (coronary artery disease)    Cervical spondylosis without myelopathy 06/02/2015   COPD (chronic obstructive pulmonary disease) ([place])    Emphysema lung ([place])    GERD (gastroesophageal reflux disease)    High cholesterol    Hypertension    Parkinson's disease ([place])    PVD (peripheral vascular disease) ([place])    Tremor 06/02/2015   Past Surgical History:  Procedure Laterality Date   TONSILLECTOMY     VASECTOMY     Patient Active Problem List   Diagnosis Date Noted   Leukopenia 02/28/2022   Acquired deformities of toe(s), unspecified, unspecified foot 11/27/2021   Asthma 11/27/2021   Cervicalgia 11/27/2021   Corns and callosities 11/27/2021   Disturbance of skin sensation 11/27/2021   Elevated prostate specific antigen (PSA) 11/27/2021   Excessive attrition of teeth 11/27/2021   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   Family history of cancer 11/27/2021   Family history of malignant neoplasm of prostate 11/27/2021   Hyperlipidemia 11/27/2021   Iron deficiency anemia, unspecified 11/27/2021   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[place]) 11/27/2021   Myalgia 11/27/2021   Nail dystrophy 11/27/2021   Benign neoplasm of other parts of mouth 11/27/2021   Open angle with borderline findings, high risk, left eye  11/27/2021   Other abnormal glucose 11/27/2021   Other reduced mobility 11/27/2021   Other secondary cataract, bilateral 11/27/2021   Pain in right thigh 11/27/2021   Pain in right knee 11/27/2021   Pain in unspecified shoulder 11/27/2021   Persons encountering health services in other specified circumstances 11/27/2021   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/27/2021   Right lower quadrant pain 11/27/2021   Tinnitus 11/27/2021   Trochanteric bursitis, right hip 11/27/2021   Unilateral primary osteoarthritis, right hip 11/27/2021   Unspecified fall, initial encounter 11/27/2021   Vitamin D deficiency, unspecified 11/27/2021   Weakness 11/27/2021   Constipation 08/28/2021   Chronic obstructive pulmonary disease ([place]) 05/31/2021   Depression 04/13/2021   Parkinson's disease ([place]) 02/28/2021   Weakness of both legs 02/01/2021   Dysphagia 02/01/2021   Weakness of right leg 01/06/2021   Lumbar radiculopathy 12/30/2020   Prostate cancer ([place]) 06/09/2020   Well adult exam 06/04/2019   BPH (benign prostatic hyperplasia) 06/04/2019   Bilateral carotid bruits 06/04/2019   Meniere disease, 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   Emphysema of lung ([place]) 06/04/2019   Spondylolisthesis at L4-L5 level 08/27/2017   Spinal stenosis in cervical region 08/27/2017   Tremor 06/02/2015   Cervical spondylosis without myelopathy 06/02/2015   Alcoholism in remission ([place]) 06/09/2014   Coronary arteriosclerosis 07/30/2009  History of colonoscopy 07/30/2009   Neutropenia ([place]) 07/30/2008   HIP PAIN 11/27/2007   Essential hypertension 07/25/2007   Atrial fibrillation ([place]) 07/25/2007   GERD 07/25/2007   ERECTILE DYSFUNCTION 07/25/2007   LOW BACK PAIN 07/25/2007    ONSET DATE: March 2022 PD dx  REFERRING DIAG:  R13.10 (ICD-10-CM) - Dysphagia   THERAPY DIAG:  Dysarthria and anarthria  Rationale for Evaluation and Treatment  Rehabilitation  SUBJECTIVE: "Pt reporting to fatigue ".     PAIN:  Are you having pain? Yes  4: Right leg  Speech Therapy Progress Note  Dates of Reporting Period: 02-21-22 to 04-05-22  Objective Reports of Subjective Statement: Pt seen for 10 visits targeting improved speech clarity and increased intensity per hypokinetic dysarthria. Pt reports mild improvement in communication efficacy since initiation of speech therapy when communicating out in community.   Objective Measurements: Moderate improvement in vocal intensity during structured speech exercises, averaging 70+ dB during cognitive speech level exercises with pt generating content based on given prompt. Use of Speak Out! program. Mild improvement observed at spontaneous conversational level with decreasing verbal cues following structured practice.   Goal Update: see below  Plan: continue per POC  Reason Skilled Services are Required: To optimize communication efficacy across communication partners.   OBJECTIVE:   TODAY'S TREATMENT:            04-09-22: Target improving intelligibility and vocal intensity using Speak Out! lesson 16 Occasional , faded to rare, min-A to meet desired dB levels and complete exercises accurately during structured exercises. Averages as follows: 92 dB loud "ah"; counting 84 dB; reading 75dB; cognitive exercise 65 dB. Usual mod-A to use of intensity across cognitive exercise trials. In simple conversation,  Pt averages 68 dB with usual verbal cues With visual cues,  [name] self-corrected low volume x3. No carryover of volume or intent when speaking on phone call he took during [place]  04-05-22: Target improving clarity of speech and vocal intensity using Speak Out! lesson 15. Usual, faded to rare, min-A to meet desired dB levels and complete exercises accurately during structured exercises. Averages as follows: 87 dB loud "ah"; counting 83 dB; reading 73 dB; cognitive exercise 64 dB. Usual max-A to use of  intensity across cognitive exercise trials. Following structured practice, limited carryover exhibited. Pt averages 65 dB with usual verbal cues in short conversational exchange. Pt observed to self-correct x2 when losing vocal intensity. Pt attributing low volume in conversation to being fatigued.   04-04-22: Pt enters with suboptimal volume (ranging from 64-73 dB, averaging 67 dB over 5 minutes) and imprecise articulation impacting overall intelligibility, resulting in SLP having to ask for repetition x6 in opening conversation. Pt attempting to self correct through using intentional speech with intermittent success. Pt reporting ongoing fatigue and "depression" d/t lack of progress with Parkinson's management, tells SLP "I'm doing what I can." SLP provides education on requirement to use intentional speech to aid in speech clarity and intensity, advises pt he will always need to use intent d/t nature of Parkinson's, dopamine impact and it's role in automatic movements. Pt verbalizes understanding, is able to teach back what will improve his clarity with min-A. Target improving clarity of speech and vocal intensity using Speak Out! lesson 15. Usual, faded to rare, min-A to meet desired dB levels and complete exercises accurately during structured exercises. Averages as follows: 97 dB loud "ah"; counting 84 dB; reading 77 dB; cognitive exercise 69 dB. Decreased volume noted for cognitive exercise 2/2 difficulty understanding  task, with some scaling volume increasing to 71 dB. Pt verbalizes appreciation for SLP feedback and encouragement throughout session.   PATIENT EDUCATION: Education details: See above (and patient instructions) Person educated: Patient Education method: Explanation, Demonstration, Verbal cues, and Handouts Education comprehension: verbalized understanding, returned demonstration, verbal cues required, and needs further education     HOME EXERCISE PROGRAM: Speak Out      GOALS: Goals reviewed with patient? Yes   SHORT TERM GOALS: Target date: 03/26/22 Pt will complete HEP for dysarthria with occasional min A over 2 sessions Baseline:  Goal status: Not met   2.  Pt will average 70dB 18/20 sentences with rare min A over 2 sessions Baseline: 03-07-22 Goal status: Met   3.  Pt will average 70dB over 8 minute simple conversation with occasional min A over 2 sessions Baseline:  Goal status: Met   4.  Pt will complete MBSS Baseline:  Goal status: Met   5.  Pt will follow diet modifications and swallow precautions after MBSS with occasional min A Baseline:  Goal status: deferred      LONG TERM GOALS: Target date: 05/16/22   Pt will complete HEP for dysarthria with rare min A over 2 sessions Baseline:  Goal status: ongoing   2.  Pt will complete HEP for dysphagia if indicated s/p MBSS with occasional min A Baseline:  Goal status: deferred, WFL swallow   3.  Pt will average 70 dB during 15 minute conversation with rare min A over 2 sessions Baseline:  Goal status: ongoing   4.  Pt will improve score on Communicative Effectiveness Survey Baseline: 13/32 Goal status: ongoing      ASSESSMENT:   CLINICAL IMPRESSION: Patient is a 76 y.o. male who was seen today for dysphagia and dysarthria. Conducted ongoing education and training of recommended swallow precautions to optimize safety during swallowing as well as Speak Out! Program to maximize patient vocal intensity and clarity. Pt is achieving volume targets on Speak Out exercises with usual mod to max A initially, with inconsistent carryover in simple conversation in [place] room, however limited carryover outside of ST or on phone.Recommend skilled ST to maximize intelligibility for safety, QOL and safety of swallow.    OBJECTIVE IMPAIRMENTS  Objective impairments include memory, dysphagia, and dysarthria. These impairments are limiting patient from effectively communicating at home and in community  and safety when swallowing. Factors affecting potential to achieve goals and functional outcome are medical prognosis. Patient will benefit from skilled SLP services to address above impairments and improve overall function.   REHAB POTENTIAL: Good   PLAN: SLP FREQUENCY: 2x/week   SLP DURATION: 12 weeks   PLANNED INTERVENTIONS: Aspiration precaution training, Pharyngeal strengthening exercises, Diet toleration management , Language facilitation, Environmental controls, Trials of upgraded texture/liquids, Cueing hierachy, Cognitive reorganization, Internal/external aids, Functional tasks, Multimodal communication approach, and SLP instruction and feedback    Pratt, Annye Rusk, [place] 04/09/2022, 11:52 AM

## 2022-04-09 NOTE — Patient Instructions (Addendum)
Ways to Prevent Future Parkinson's-Related Complications:  1.   EXERCISE regularly.  Perform your therapy exercises and incorporate safe aerobic exercise when possible (swimming, stationary bike, arm bike, seated stepper)  2.   Keep you FEET APART when you are standing (or sitting) to allow you to have better balance and reach further (which can help with shoulder rigidity).  Also make sure your feet are apart when you are sitting before you stand up.  3.   Focus on BIGGER movements during daily activities--deliberately reach overhead, straighten elbows and extend fingers  4.   Anytime you reach or move shoulder, make sure you have good upright POSTURE.  5.   When dressing or reaching for your seatbelt make sure to use your body to assist by TWISTING and LOOKING at where you are reaching while you reach--this can help to minimize stress on the shoulder and reduce the risk of a rotator cuff tear, gives visual feedback to the brain about movement size and where your arm is in space, makes eye muscles more active, and helps with neck/trunk rigidity  6.  When you reach for something overhead, make sure your THUMB is facing UP.  This is a better position for your shoulder.  7.  SWING YOUR ARMS when you walk! (If able).  People with PD are at increased risk for frozen shoulder and swinging your arms can reduce this risk.         Quick Facts Levodopa is often viewed as the first-line drug for the management of Parkinson's motor symptoms. Levodopa (in pill form) is absorbed in the blood from the small intestine and travels through the blood to the brain, where it is converted into dopamine. Levodopa is almost always given in combination with the drug carbidopa, which reduces or prevents the nausea that levodopa alone can cause. Carbidopa-levodopa is delivered in many forms, including immediate-release, controlled-release, or time-released. In addition to pills and capsules, it is also  available as an intestinal gel (DUOPA) and inhaler (Inbrija).   Forms of Levodopa Taking the right medication on time every time is critical to Parkinson's symptom management. Due to various strengths and sizes, it is important that people with PD are aware which carbidopa-levodopa preparation they are taking. Be careful when renewing prescriptions at the pharmacy or when receiving medications in the hospital because the accidental substitution of a different formulation may lead to an under or over dose.  Immediate Release Tablets Available Doses: 10-100 mg, 25-100 mg, 25-250 mg  Typical Treatment Regimen: 150 to 1,000 mg of levodopa total per day (divided 3 to 4 times)  Common Side Effects: nausea, lower blood pressure, confusion, hallucinations, dyskinesia (extra movements)  Indications for Usage: Monotherapy (alone) or combination therapy for slowness, stiffness, and tremor  Common levodopa side effects include: Nausea Vomiting Loss of appetite Lightheadedness Lowered blood pressure Confusion Dyskinesia (extra movements) Hallucinations Sleepiness or sudden-onset sleep (uncommon) Impulsivity (uncommon) Side effects can be minimized with a low starting dose when initiating treatment with any antiparkinsonian drug and increasing the dose slowly to a satisfactory level. This is particularly helpful in elderly people with Parkinson's whose tolerance for medications is often less than in younger persons. Taking drugs with meals can also reduce the frequency and intensity of gastrointestinal side effects, although some may find less benefit if taken with a high protein meal. For people who have persistent nausea or vomiting, adding extra carbidopa (Lodosyn) to each dose of carbidopa-levodopa can also help.   Nutrition/timing with meals:  Most people have no problem taking medications with meals, but some experience less benefit if they take carbidopa-levodopa with a high protein meal  (including meats, cheeses and other dairy products). When this occurs, it is recommended to only take carbidopa-levodopa along with non-protein foods or similarly 30 minutes before, or 60 minutes after eating a meal. This allows for quick absorption before food can interfere.  The absorption of levodopa may also be reduced when taken with iron supplements.  If having nausea with medication, a graham or soda cracker along with ginger tea may help and are low in protein so should not interfere with the absorption of carbidopa-levodopa.

## 2022-04-09 NOTE — Therapy (Signed)
OUTPATIENT PHYSICAL THERAPY NEURO TREATMENT   Patient Name: Lee [name] MRN: [id number] DOB:11-06-1945, 76 y.o., male Today's Date: 04/09/2022   PCP: Cassandria Anger, MD REFERRING PROVIDER: Harvie Heck, MD    PT End of Session - 04/09/22 0937     Visit Number 13    Number of Visits 16    Date for PT Re-Evaluation 05/22/22    Authorization Type [facility] - 15 visits approved from 12/29/21 - 04/28/22    Authorization - Visit Number 12    Authorization - Number of Visits 15    PT Start Time [phone]   pt arrived late to session   PT Stop Time 1015    PT Time Calculation (min) 39 min    Equipment Utilized During Treatment Gait belt    Activity Tolerance Patient tolerated treatment well    Behavior During Therapy WFL for tasks assessed/performed                Past Medical History:  Diagnosis Date   Acid reflux    Alcohol abuse    quit 10 y ago   Arthritis    BPH (benign prostatic hyperplasia)    CAD (coronary artery disease)    Cervical spondylosis without myelopathy 06/02/2015   COPD (chronic obstructive pulmonary disease) ([place])    Emphysema lung ([place])    GERD (gastroesophageal reflux disease)    High cholesterol    Hypertension    Parkinson's disease ([place])    PVD (peripheral vascular disease) ([place])    Tremor 06/02/2015   Past Surgical History:  Procedure Laterality Date   TONSILLECTOMY     VASECTOMY     Patient Active Problem List   Diagnosis Date Noted   Leukopenia 02/28/2022   Acquired deformities of toe(s), unspecified, unspecified foot 11/27/2021   Asthma 11/27/2021   Cervicalgia 11/27/2021   Corns and callosities 11/27/2021   Disturbance of skin sensation 11/27/2021   Elevated prostate specific antigen (PSA) 11/27/2021   Excessive attrition of teeth 11/27/2021   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   Family history of cancer 11/27/2021   Family history of malignant neoplasm of prostate 11/27/2021   Hyperlipidemia 11/27/2021   Iron  deficiency anemia, unspecified 11/27/2021   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[place]) 11/27/2021   Myalgia 11/27/2021   Nail dystrophy 11/27/2021   Benign neoplasm of other parts of mouth 11/27/2021   Open angle with borderline findings, high risk, left eye 11/27/2021   Other abnormal glucose 11/27/2021   Other reduced mobility 11/27/2021   Other secondary cataract, bilateral 11/27/2021   Pain in right thigh 11/27/2021   Pain in right knee 11/27/2021   Pain in unspecified shoulder 11/27/2021   Persons encountering health services in other specified circumstances 11/27/2021   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/27/2021   Right lower quadrant pain 11/27/2021   Tinnitus 11/27/2021   Trochanteric bursitis, right hip 11/27/2021   Unilateral primary osteoarthritis, right hip 11/27/2021   Unspecified fall, initial encounter 11/27/2021   Vitamin D deficiency, unspecified 11/27/2021   Weakness 11/27/2021   Constipation 08/28/2021   Chronic obstructive pulmonary disease ([place]) 05/31/2021   Depression 04/13/2021   Parkinson's disease ([place]) 02/28/2021   Weakness of both legs 02/01/2021   Dysphagia 02/01/2021   Weakness of right leg 01/06/2021   Lumbar radiculopathy 12/30/2020   Prostate cancer ([place]) 06/09/2020   Well adult exam 06/04/2019   BPH (benign prostatic hyperplasia) 06/04/2019   Bilateral carotid bruits 06/04/2019  Meniere disease, 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   Emphysema of lung ([place]) 06/04/2019   Spondylolisthesis at L4-L5 level 08/27/2017   Spinal stenosis in cervical region 08/27/2017   Tremor 06/02/2015   Cervical spondylosis without myelopathy 06/02/2015   Alcoholism in remission ([place]) 06/09/2014   Coronary arteriosclerosis 07/30/2009   History of colonoscopy 07/30/2009   Neutropenia ([place]) 07/30/2008   HIP PAIN 11/27/2007   Essential hypertension 07/25/2007   Atrial fibrillation ([place]) 07/25/2007    GERD 07/25/2007   ERECTILE DYSFUNCTION 07/25/2007   LOW BACK PAIN 07/25/2007    ONSET DATE: 12/29/2021   REFERRING DIAG: G20 (ICD-10-CM) - Parkinson disease ([place])   THERAPY DIAG:  Other abnormalities of gait and mobility  Muscle weakness (generalized)  Abnormal posture  Other symptoms and signs involving the nervous system  Rationale for Evaluation and Treatment Rehabilitation  PERTINENT HISTORY: PMH:  neck and back pain, PD -dx in March 2022 per pt report, hx of ETOH abuse, CAD, COPD, emphysema, GERD, HTN, high cholesterol, PVD, pt with hx of being non-compliant with meds  PATIENT GOALS Wants to get rid of the PD and go back to where he was 2-3 years ago. Get back to walking in the park.    PRECAUTIONS: Fall   SUBJECTIVE: Nothing new. No falls, has a lot of stumbles, but is able to catch himself. Sometimes not using the cane.                                                                                                                                                                                            Pt accompanied by: self   PAIN:  Are you having pain? Yes: NPRS scale: 3/10 Pain location: RLE, distal to knee Pain description: Dull Aggravating factors: Depends how his body is like, pressure Relieving factors: Sitting up taller, tumeric    VITALS Vitals:   04/09/22 0941  BP: 117/62  Pulse: 79         TODAY'S TREATMENT:  Therapeutic Exercise: SciFit with BUE/BLE for strengthening, activity tolerance, aerobic warmup, reciprocal movement patterns at gear 4.0 for 8 minutes. Pt reporting 8/10 RPE afterwards after. Pt able to keep spm > 40 throughout.    BALANCE/NMR: Standing on thick blue foam: Alternating marching x15 reps each leg, with UE support > none, cues for slowed pace and incr ROM when lifting leg. Pt with more challenge lifting RLE and has difficulty holding it up in the air.  Alternating lateral stepping strategy on and off (PWR Step)  with added cognitive challenge with pt needing to name foods in alphabetical order, x8 reps each side. Pt challenged by cognitive task and  needing cues at times to think of foods and needs cues to stay on the task at hand.   Standing on rockerboard: 10 reps standing PWR Up, pt needing intermittent UE support as needed.   GAIT: Gait pattern: step through pattern, decreased arm swing- Left, decreased step length- Right, decreased step length- Left, decreased stride length, festinating, and trunk flexed Distance walked: Various clinic distances with cane.  Assistive device utilized: Single point cane Level of assistance: SBA Comments: Gait with cane during session, reviewed rocking technique initially in standing to initiate gait and using a marching turn for improved foot clearance.    PATIENT EDUCATION: Education details: Provided handout on local community resources and reviewed with pt for support groups and community fitness.   Person educated: Patient Education method: Theatre stage manager Education comprehension: verbalized understanding   HOME EXERCISE PROGRAM: [email or web address], Seated and Standing PWR moves   Access Code: [email or web address] URL: [email or web address] Date: 03/05/2022 Prepared by: Mickie Bail Plaster  Exercises - Alternating Step Backward with Support  - 1 x daily - 5 x weekly - 1 sets - 10 reps - Alternating Step Forward with Support  - 1 x daily - 5 x weekly - 1 sets - 10 reps - Step Sideways  - 1 x daily - 5 x weekly - 1 sets - 10 reps - Standing Quarter Turn with Counter Support  - 1 x daily - 5 x weekly - 1 sets - 5 reps - Sit to Stand Without Arm Support  - 1 x daily - 7 x weekly - 3 sets - 10 reps    GOALS: Goals reviewed with patient? Yes  SHORT TERM GOALS: Target date: 03/21/2022  Pt will be independent with initial HEP for improved strength, balance, transfers, and gait.  Baseline: Pt not performing regularly  Goal status: IN PROGRESS  2.  Pt  will verbalize tips to reduce festination/freezing with gait and turns.  Baseline: Pt unable to teach therapist tips when asked. However, when therapist attempts to educate pt, pt claims he is already integrating tips and does not need education  Goal status: IN PROGRESS  3.  Pt will verbalize understanding of local Parkinson's disease resources.  Baseline:  Goal status: IN PROGRESS  4.  Push and release test to be assessed with LTG written for improved balance recovery. Baseline: Anterior = 4-5 small steps Posterior = 5-6 small steps, min A for steadying Lateral = unable to elicit a step in either direction.  Goal status: MET  5.   Pt will improve 5x sit<>stand with BUE support to less than or equal to 22 sec to demonstrate improved functional strength and transfer efficiency.  Baseline: 26.57 seconds with BUE support; 13.85s without UE support   Goal status: MET    LONG TERM GOALS: Target date: 04/18/2022  Pt will be independent with final HEP for improved strength, balance, transfers, and gait.  Baseline:  Goal status: INITIAL  2.   Pt will improve gait velocity to at least 2.4 ft/sec with LRAD for improved gait efficiency and safety.   Baseline: 14.96 seconds with SPC = 2.19 ft/sec Goal status: INITIAL  3.   Pt will ambulate at least 500' ft indoor and outdoor surfaces, appropriate assistive device with supervision for imrpoved community gait.   Baseline:  Goal status: INITIAL  4.  Pt will recover posterior and anterior balance in push and release test in 2-3 or less steps independently, for improved balance recovery.   Baseline: Anterior = 4-5 small steps  Posterior = 5-6 small steps, min A for steadying Goal status: INITIAL  5.  Pt will improve 5x sit<>stand with BUE support to less than or equal to 11.5 sec to demonstrate improved functional strength and transfer efficiency. Baseline: 26.57 seconds with BUE support; 13.85s on 8/23  Goal status: REVISED  6.  Pt will  improve cog TUG to 17 seconds or less in order to demo improved dual tasking/decr fall risk.  Baseline: 22.38 sec with SPC  Goal status: INITIAL    ASSESSMENT:  CLINICAL IMPRESSION: Provided handout during session for community resources for local support groups and potential community fitness options and reviewed with pt. Continued to focus on balance tasks during session on unlevel surfaces, SLS, and dual tasking. Pt challenged by adding in cognitive dual tasking during activities and has difficulty performing both at the same time. Will continue to progress towards LTGs.   OBJECTIVE IMPAIRMENTS Abnormal gait, decreased activity tolerance, decreased balance, decreased cognition, decreased coordination, decreased endurance, decreased knowledge of use of DME, difficulty walking, decreased strength, decreased safety awareness, impaired flexibility, impaired sensation, impaired tone, postural dysfunction, and pain.   ACTIVITY LIMITATIONS stairs, transfers, bed mobility, and locomotion level  PARTICIPATION LIMITATIONS: cleaning and community activity  PERSONAL FACTORS Behavior pattern, Past/current experiences, Time since onset of injury/illness/exacerbation, and 3+ comorbidities: neck and back pain, PD -dx in March 2022 per pt report, hx of ETOH abuse, CAD, COPD, emphysema, GERD, HTN, high cholesterol, PVD  are also affecting patient's functional outcome.   REHAB POTENTIAL: Good  CLINICAL DECISION MAKING: Evolving/moderate complexity  EVALUATION COMPLEXITY: Moderate  PLAN: PT FREQUENCY: 2x/week  PT DURATION: 12 weeks  PLANNED INTERVENTIONS: Therapeutic exercises, Therapeutic activity, Neuromuscular re-education, Balance training, Gait training, Patient/Family education, Self Care, Stair training, DME instructions, and Re-evaluation  PLAN FOR NEXT SESSION:   Stepping strategies for balance, weight shifting/SLS, continued education on techniques for freezing/festination episodes.   Unlevel surfaces, rebounder drills, high amplitude, movement    Janann August, PT, DPT 04/09/22 12:16 PM

## 2022-04-10 NOTE — Therapy (Unsigned)
OUTPATIENT SPEECH LANGUAGE PATHOLOGY TREATMENT NOTE   Patient Name: Lee [name] MRN: [id number] DOB:11-21-1945, 76 y.o., male Today's Date: 04/11/2022  PCP: Dr. Lew Dawes  REFERRING PROVIDER: Harvie Heck, MD   END OF SESSION:   End of Session - 04/11/22 0918     Visit Number 12    Number of Visits 25    Date for SLP Re-Evaluation 05/16/22    SLP Start Time 0930    SLP Stop Time  1950    SLP Time Calculation (min) 45 min    Activity Tolerance Patient tolerated treatment well                  Past Medical History:  Diagnosis Date   Acid reflux    Alcohol abuse    quit 10 y ago   Arthritis    BPH (benign prostatic hyperplasia)    CAD (coronary artery disease)    Cervical spondylosis without myelopathy 06/02/2015   COPD (chronic obstructive pulmonary disease) ([place])    Emphysema lung ([place])    GERD (gastroesophageal reflux disease)    High cholesterol    Hypertension    Parkinson's disease ([place])    PVD (peripheral vascular disease) ([place])    Tremor 06/02/2015   Past Surgical History:  Procedure Laterality Date   TONSILLECTOMY     VASECTOMY     Patient Active Problem List   Diagnosis Date Noted   Leukopenia 02/28/2022   Acquired deformities of toe(s), unspecified, unspecified foot 11/27/2021   Asthma 11/27/2021   Cervicalgia 11/27/2021   Corns and callosities 11/27/2021   Disturbance of skin sensation 11/27/2021   Elevated prostate specific antigen (PSA) 11/27/2021   Excessive attrition of teeth 11/27/2021   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   Family history of cancer 11/27/2021   Family history of malignant neoplasm of prostate 11/27/2021   Hyperlipidemia 11/27/2021   Iron deficiency anemia, unspecified 11/27/2021   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[place]) 11/27/2021   Myalgia 11/27/2021   Nail dystrophy 11/27/2021   Benign neoplasm of other parts of mouth 11/27/2021   Open angle with borderline findings, high risk, left eye  11/27/2021   Other abnormal glucose 11/27/2021   Other reduced mobility 11/27/2021   Other secondary cataract, bilateral 11/27/2021   Pain in right thigh 11/27/2021   Pain in right knee 11/27/2021   Pain in unspecified shoulder 11/27/2021   Persons encountering health services in other specified circumstances 11/27/2021   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/27/2021   Right lower quadrant pain 11/27/2021   Tinnitus 11/27/2021   Trochanteric bursitis, right hip 11/27/2021   Unilateral primary osteoarthritis, right hip 11/27/2021   Unspecified fall, initial encounter 11/27/2021   Vitamin D deficiency, unspecified 11/27/2021   Weakness 11/27/2021   Constipation 08/28/2021   Chronic obstructive pulmonary disease ([place]) 05/31/2021   Depression 04/13/2021   Parkinson's disease ([place]) 02/28/2021   Weakness of both legs 02/01/2021   Dysphagia 02/01/2021   Weakness of right leg 01/06/2021   Lumbar radiculopathy 12/30/2020   Prostate cancer ([place]) 06/09/2020   Well adult exam 06/04/2019   BPH (benign prostatic hyperplasia) 06/04/2019   Bilateral carotid bruits 06/04/2019   Meniere disease, 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   Emphysema of lung ([place]) 06/04/2019   Spondylolisthesis at L4-L5 level 08/27/2017   Spinal stenosis in cervical region 08/27/2017   Tremor 06/02/2015   Cervical spondylosis without myelopathy 06/02/2015   Alcoholism in remission ([place]) 06/09/2014   Coronary arteriosclerosis  07/30/2009   History of colonoscopy 07/30/2009   Neutropenia ([place]) 07/30/2008   HIP PAIN 11/27/2007   Essential hypertension 07/25/2007   Atrial fibrillation ([place]) 07/25/2007   GERD 07/25/2007   ERECTILE DYSFUNCTION 07/25/2007   LOW BACK PAIN 07/25/2007    ONSET DATE: March 2022 PD dx  REFERRING DIAG:  R13.10 (ICD-10-CM) - Dysphagia   THERAPY DIAG:  Dysarthria and anarthria  Rationale for Evaluation and Treatment  Rehabilitation  SUBJECTIVE: "pretty good"    PAIN:  Are you having pain? Yes  R hip 4/10  OBJECTIVE:   TODAY'S TREATMENT:   04-11-22: Pt enters with volume averaging 64 dB across 10 minute unstructured conversation with usual verbal cues for using intentional speech. Pt able to teach back principles of intent, and why relevant for improved speech clarity for speaking with PD, with usual mod-A, through questioning cues. SLP had pt ID definition of "intent" which resonated with him for use as visual cue in structured practice: "concentrate." SLP facilitated Speak Out lesson 17 to target improved vocal intensity and clarity over increasingly complex structured tasks. Averages this date: sustained, loud "ah": 92 dB; counting 78 dB; reading (paragraphs) 76 dB; cognitive exercise 69 dB. Pt reporting questions about how best to take his medications, having just learned one medication should be taken without food. Generated medication chart for pt to fill in with provider re: medications, what they are for, and how to take. Pt to take to upcoming doctor's appointment. SLP suggested this would be helpful to have as reference, kept with medications to optimize medication administration adherence.   04-09-22: Target improving intelligibility and vocal intensity using Speak Out! lesson 16 Occasional , faded to rare, min-A to meet desired dB levels and complete exercises accurately during structured exercises. Averages as follows: 92 dB loud "ah"; counting 84 dB; reading 75dB; cognitive exercise 65 dB. Usual mod-A to use of intensity across cognitive exercise trials. In simple conversation,  Pt averages 68 dB with usual verbal cues With visual cues,  [name] self-corrected low volume x3. No carryover of volume or intent when speaking on phone call he took during [place]: Education details: See above (and patient instructions) Person educated: Patient Education method: Explanation, Demonstration,  Verbal cues, and Handouts Education comprehension: verbalized understanding, returned demonstration, verbal cues required, and needs further education     HOME EXERCISE PROGRAM: Speak Out     GOALS: Goals reviewed with patient? Yes   SHORT TERM GOALS: Target date: 03/26/22 Pt will complete HEP for dysarthria with occasional min A over 2 sessions Baseline:  Goal status: Not met   2.  Pt will average 70dB 18/20 sentences with rare min A over 2 sessions Baseline: 03-07-22 Goal status: Met   3.  Pt will average 70dB over 8 minute simple conversation with occasional min A over 2 sessions Baseline:  Goal status: Met   4.  Pt will complete MBSS Baseline:  Goal status: Met   5.  Pt will follow diet modifications and swallow precautions after MBSS with occasional min A Baseline:  Goal status: deferred      LONG TERM GOALS: Target date: 05/16/22   Pt will complete HEP for dysarthria with rare min A over 2 sessions Baseline:  Goal status: ongoing   2.  Pt will complete HEP for dysphagia if indicated s/p MBSS with occasional min A Baseline:  Goal status: deferred, WFL swallow   3.  Pt will average 70 dB during 15 minute conversation with rare  min A over 2 sessions Baseline:  Goal status: ongoing   4.  Pt will improve score on Communicative Effectiveness Survey Baseline: 13/32 Goal status: ongoing      ASSESSMENT:   CLINICAL IMPRESSION: Patient is a 76 y.o. male who was seen today for dysphagia and dysarthria. Conducted ongoing education and training of recommended swallow precautions to optimize safety during swallowing as well as Speak Out! Program to maximize patient vocal intensity and clarity. Pt is achieving volume targets on Speak Out exercises with usual mod to max A initially, with inconsistent carryover in simple conversation in [place] room, however limited carryover outside of ST or on phone.Recommend skilled ST to maximize intelligibility for safety, QOL and safety of  swallow.    OBJECTIVE IMPAIRMENTS  Objective impairments include memory, dysphagia, and dysarthria. These impairments are limiting patient from effectively communicating at home and in community and safety when swallowing. Factors affecting potential to achieve goals and functional outcome are medical prognosis. Patient will benefit from skilled SLP services to address above impairments and improve overall function.   REHAB POTENTIAL: Good   PLAN: SLP FREQUENCY: 2x/week   SLP DURATION: 12 weeks   PLANNED INTERVENTIONS: Aspiration precaution training, Pharyngeal strengthening exercises, Diet toleration management , Language facilitation, Environmental controls, Trials of upgraded texture/liquids, Cueing hierachy, Cognitive reorganization, Internal/external aids, Functional tasks, Multimodal communication approach, and SLP instruction and feedback    Su Monks, [place] 04/11/2022, 9:18 AM

## 2022-04-11 ENCOUNTER — Ambulatory Visit: Payer: No Typology Code available for payment source | Admitting: Occupational Therapy

## 2022-04-11 ENCOUNTER — Ambulatory Visit: Payer: No Typology Code available for payment source | Admitting: Speech Pathology

## 2022-04-11 ENCOUNTER — Ambulatory Visit: Payer: No Typology Code available for payment source | Admitting: Physical Therapy

## 2022-04-11 VITALS — BP 112/58 | HR 70

## 2022-04-11 DIAGNOSIS — R41844 Frontal lobe and executive function deficit: Secondary | ICD-10-CM

## 2022-04-11 DIAGNOSIS — R293 Abnormal posture: Secondary | ICD-10-CM

## 2022-04-11 DIAGNOSIS — R29818 Other symptoms and signs involving the nervous system: Secondary | ICD-10-CM

## 2022-04-11 DIAGNOSIS — R2681 Unsteadiness on feet: Secondary | ICD-10-CM

## 2022-04-11 DIAGNOSIS — R471 Dysarthria and anarthria: Secondary | ICD-10-CM | POA: Diagnosis not present

## 2022-04-11 DIAGNOSIS — R278 Other lack of coordination: Secondary | ICD-10-CM

## 2022-04-11 DIAGNOSIS — R29898 Other symptoms and signs involving the musculoskeletal system: Secondary | ICD-10-CM

## 2022-04-11 DIAGNOSIS — M6281 Muscle weakness (generalized): Secondary | ICD-10-CM | POA: Diagnosis not present

## 2022-04-11 DIAGNOSIS — R2689 Other abnormalities of gait and mobility: Secondary | ICD-10-CM | POA: Diagnosis not present

## 2022-04-11 DIAGNOSIS — R251 Tremor, unspecified: Secondary | ICD-10-CM

## 2022-04-11 DIAGNOSIS — R41840 Attention and concentration deficit: Secondary | ICD-10-CM

## 2022-04-11 NOTE — Therapy (Signed)
OUTPATIENT PHYSICAL THERAPY NEURO TREATMENT   Patient Name: Lee [name] MRN: [id number] DOB:1945/09/15, 76 y.o., male Today's Date: 04/11/2022   PCP: Cassandria Anger, MD REFERRING PROVIDER: Harvie Heck, MD    PT End of Session - 04/11/22 1018     Visit Number 14    Number of Visits 16    Date for PT Re-Evaluation 05/22/22    Authorization Type VA - 15 visits approved from 12/29/21 - 04/28/22    Authorization - Visit Number 12    Authorization - Number of Visits 15    PT Start Time 1937   Pt in restroom   PT Stop Time 1056   Pt using retroom for 5 minutes during session   PT Time Calculation (min) 38 min    Equipment Utilized During Treatment Gait belt    Activity Tolerance Patient tolerated treatment well;Other (comment)   Dizziness   Behavior During Therapy WFL for tasks assessed/performed                 Past Medical History:  Diagnosis Date   Acid reflux    Alcohol abuse    quit 10 y ago   Arthritis    BPH (benign prostatic hyperplasia)    CAD (coronary artery disease)    Cervical spondylosis without myelopathy 06/02/2015   COPD (chronic obstructive pulmonary disease) ([facility])    Emphysema lung ([facility])    GERD (gastroesophageal reflux disease)    High cholesterol    Hypertension    Parkinson's disease ([place])    PVD (peripheral vascular disease) (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   Family history of cancer 11/27/2021   Family history of malignant neoplasm of  prostate 11/27/2021   Hyperlipidemia 11/27/2021   Iron deficiency anemia, unspecified 11/27/2021   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[place]) 11/27/2021   Myalgia 11/27/2021   Nail dystrophy 11/27/2021   Benign neoplasm of other parts of mouth 11/27/2021   Open angle with borderline findings, high risk, left eye 11/27/2021   Other abnormal glucose 11/27/2021   Other reduced mobility 11/27/2021   Other secondary cataract, bilateral 11/27/2021   Pain in right thigh 11/27/2021   Pain in right knee 11/27/2021   Pain in unspecified shoulder 11/27/2021   Persons encountering health services in other specified circumstances 11/27/2021   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/27/2021   Right lower quadrant pain 11/27/2021   Tinnitus 11/27/2021   Trochanteric bursitis, right hip 11/27/2021   Unilateral primary osteoarthritis, right hip 11/27/2021   Unspecified fall, initial encounter 11/27/2021   Vitamin D deficiency, unspecified 11/27/2021   Weakness 11/27/2021   Constipation 08/28/2021   Chronic obstructive pulmonary disease ([place]) 05/31/2021   Depression 04/13/2021   Parkinson's disease ([place]) 02/28/2021   Weakness of both legs 02/01/2021   Dysphagia 02/01/2021   Weakness of right leg 01/06/2021   Lumbar radiculopathy 12/30/2020   Prostate cancer ([place]) 06/09/2020   Well adult exam 06/04/2019  BPH (benign prostatic hyperplasia) 06/04/2019   Bilateral carotid bruits 06/04/2019   Meniere disease, 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   Emphysema of lung ([place]) 06/04/2019   Spondylolisthesis at L4-L5 level 08/27/2017   Spinal stenosis in cervical region 08/27/2017   Tremor 06/02/2015   Cervical spondylosis without myelopathy 06/02/2015   Alcoholism in remission ([place]) 06/09/2014   Coronary arteriosclerosis 07/30/2009   History of colonoscopy 07/30/2009   Neutropenia ([place]) 07/30/2008   HIP PAIN 11/27/2007   Essential  hypertension 07/25/2007   Atrial fibrillation ([place]) 07/25/2007   GERD 07/25/2007   ERECTILE DYSFUNCTION 07/25/2007   LOW BACK PAIN 07/25/2007    ONSET DATE: 12/29/2021   REFERRING DIAG: G20 (ICD-10-CM) - Parkinson disease ([place])   THERAPY DIAG:  Unsteadiness on feet  Other lack of coordination  Abnormal posture  Rationale for Evaluation and Treatment Rehabilitation  PERTINENT HISTORY: PMH:  neck and back pain, PD -dx in March 2022 per pt report, hx of ETOH abuse, CAD, COPD, emphysema, GERD, HTN, high cholesterol, PVD, pt with hx of being non-compliant with meds  PATIENT GOALS Wants to get rid of the PD and go back to where he was 2-3 years ago. Get back to walking in the park.    PRECAUTIONS: Fall   SUBJECTIVE: Pt reports he is okay, having tinnitus in his R ear. Reports lots of stumbles "but I always catch myself". No new changes                                                                                                                                                                                            Pt accompanied by: self   PAIN:  Are you having pain? Yes: NPRS scale: 3/10 Pain location: Low back Pain description: Dull Aggravating factors: Depends how his body is like, pressure Relieving factors: Sitting up taller, tumeric    VITALS Vitals:   04/11/22 1038  BP: (!) 112/58  Pulse: 70      TODAY'S TREATMENT:  Therapeutic Exercise: SciFit multi-peaks level 8 for 8 minutes using BUE/BLEs for neural priming for reciprocal movement, dynamic cardiovascular warmup and increased amplitude of stepping. RPE of 8/10 following activity    NMR  In // bars for improved single leg stability, step clearance and BLE coordination:  -Attempted alt step up to airex w/contralateral march w/BUE support, but after two reps, pt stated his head was "too spinny" to continue. Vitals assessed in seated position (see above) and pt minorly hypotensive but refused water and  reported he would like to continue w/session, just not in standing position. Pt then requested to use restroom, so escorted pt to restroom and  noted significant shuffling and and scissoring of gait, requiring CGA for safety.   Regressed to supine exercises on mat due to pt's report of "head spinning" and refusal to perform standing exercises. The following were performed for improved postural control, periscapular strength and thoracic mobility: -supine "floor press" w/wooden dowel and 10# ankle weight, x15 reps  -Open/closed books, x10 per side w/min A to stabilize pelvis throughout. Min cues to maintain gaze on moving hand.    GAIT: Gait pattern: step through pattern, decreased arm swing- Left, decreased step length- Right, decreased step length- Left, decreased stride length, festinating, and trunk flexed Distance walked: Various clinic distances with cane.  Assistive device utilized: Single point cane Level of assistance: SBA Comments: Gait with cane during session, min cues to remind pt of rocking technique initially in standing to initiate gait and using a marching turn for improved foot clearance. Min cues for proper AD management w/transfers    PATIENT EDUCATION: Education details: Continue HEP, plan to DC next week  Person educated: Patient Education method: Explanation and Handouts Education comprehension: verbalized understanding   HOME EXERCISE PROGRAM: [email or web address], Seated and Standing PWR moves   Access Code: [email or web address] URL: [email or web address] Date: 03/05/2022 Prepared by: Mickie Bail Kamareon Sciandra  Exercises - Alternating Step Backward with Support  - 1 x daily - 5 x weekly - 1 sets - 10 reps - Alternating Step Forward with Support  - 1 x daily - 5 x weekly - 1 sets - 10 reps - Step Sideways  - 1 x daily - 5 x weekly - 1 sets - 10 reps - Standing Quarter Turn with Counter Support  - 1 x daily - 5 x weekly - 1 sets - 5 reps - Sit to Stand Without Arm Support  - 1 x  daily - 7 x weekly - 3 sets - 10 reps    GOALS: Goals reviewed with patient? Yes  SHORT TERM GOALS: Target date: 03/21/2022  Pt will be independent with initial HEP for improved strength, balance, transfers, and gait.  Baseline: Pt not performing regularly  Goal status: IN PROGRESS  2.  Pt will verbalize tips to reduce festination/freezing with gait and turns.  Baseline: Pt unable to teach therapist tips when asked. However, when therapist attempts to educate pt, pt claims he is already integrating tips and does not need education  Goal status: IN PROGRESS  3.  Pt will verbalize understanding of local Parkinson's disease resources.  Baseline:  Goal status: IN PROGRESS  4.  Push and release test to be assessed with LTG written for improved balance recovery. Baseline: Anterior = 4-5 small steps Posterior = 5-6 small steps, min A for steadying Lateral = unable to elicit a step in either direction.  Goal status: MET  5.   Pt will improve 5x sit<>stand with BUE support to less than or equal to 22 sec to demonstrate improved functional strength and transfer efficiency.  Baseline: 26.57 seconds with BUE support; 13.85s without UE support   Goal status: MET    LONG TERM GOALS: Target date: 04/18/2022  Pt will be independent with final HEP for improved strength, balance, transfers, and gait.  Baseline:  Goal status: INITIAL  2.   Pt will improve gait velocity to at least 2.4 ft/sec with LRAD for improved gait efficiency and safety.   Baseline: 14.96 seconds with SPC = 2.19 ft/sec Goal status: INITIAL  3.   Pt will ambulate at least 500' ft indoor and outdoor surfaces, appropriate assistive device with  supervision for imrpoved community gait.   Baseline:  Goal status: INITIAL  4.  Pt will recover posterior and anterior balance in push and release test in 2-3 or less steps independently, for improved balance recovery.   Baseline: Anterior = 4-5 small steps Posterior = 5-6  small steps, min A for steadying Goal status: INITIAL  5.  Pt will improve 5x sit<>stand with BUE support to less than or equal to 11.5 sec to demonstrate improved functional strength and transfer efficiency. Baseline: 26.57 seconds with BUE support; 13.85s on 8/23  Goal status: REVISED  6.  Pt will improve cog TUG to 17 seconds or less in order to demo improved dual tasking/decr fall risk.  Baseline: 22.38 sec with SPC  Goal status: INITIAL    ASSESSMENT:  CLINICAL IMPRESSION: Session limited due to pt reporting tinnitus and "head spinning" and requesting to use restroom 2x during session. Emphasis of skilled session on postural control, endurance and thoracic mobility. Pt's BP slightly hypotensive during session but pt denied feelings of lightheadedness. Pt inquiring about purchasing a seated pedal stepper for use at home, informed pt that it was safe to use as long as he stayed seated. Continue POC.   OBJECTIVE IMPAIRMENTS Abnormal gait, decreased activity tolerance, decreased balance, decreased cognition, decreased coordination, decreased endurance, decreased knowledge of use of DME, difficulty walking, decreased strength, decreased safety awareness, impaired flexibility, impaired sensation, impaired tone, postural dysfunction, and pain.   ACTIVITY LIMITATIONS stairs, transfers, bed mobility, and locomotion level  PARTICIPATION LIMITATIONS: cleaning and community activity  PERSONAL FACTORS Behavior pattern, Past/current experiences, Time since onset of injury/illness/exacerbation, and 3+ comorbidities: neck and back pain, PD -dx in March 2022 per pt report, hx of ETOH abuse, CAD, COPD, emphysema, GERD, HTN, high cholesterol, PVD  are also affecting patient's functional outcome.   REHAB POTENTIAL: Good  CLINICAL DECISION MAKING: Evolving/moderate complexity  EVALUATION COMPLEXITY: Moderate  PLAN: PT FREQUENCY: 2x/week  PT DURATION: 12 weeks  PLANNED INTERVENTIONS: Therapeutic  exercises, Therapeutic activity, Neuromuscular re-education, Balance training, Gait training, Patient/Family education, Self Care, Stair training, DME instructions, and Re-evaluation  PLAN FOR NEXT SESSION:  Start assessing goals and plan to DC this week. Did he purchase stepper? Stepping strategies for balance, weight shifting/SLS, continued education on techniques for freezing/festination episodes.  Unlevel surfaces, rebounder drills, high amplitude, movement    Davionne Mastrangelo E Kaylise Blakeley, PT, DPT 04/11/22 12:05 PM

## 2022-04-11 NOTE — Patient Instructions (Addendum)
Medication administration: it may be helpful to ask your PCP or another provider for a meeting regarding how you are supposed to be taking your medications.  Questions to ask:  What medications should I be taking? What are those medications for? How often & what time of day should I take those medications? How should I be taking them (example: with or without food)?   You'll want to either write these things down or ask for the information in writing so you can have a reference to aid in medication adherence.   Use the chart we created today- TAKE IT WITH YOU TO YOUR FRIDAY APPOINTMENT.    Speaking with INTENT means to be purposeful in speaking.   You identified that CONCENTRATING on your speaking is a helpful reminder for what you should be doing when using intent.

## 2022-04-11 NOTE — Therapy (Signed)
OUTPATIENT OCCUPATIONAL THERAPY PARKINSON'S TREATMENT  Patient Name: Lee [name] MRN: [id number] DOB:August 06, 1945, 76 y.o., male Today's Date: 04/11/2022  PCP: Dr. Lew Dawes REFERRING PROVIDER: Dr. Landry Mellow   OT End of Session - 04/11/22 0911     Visit Number 9    Number of Visits 25    Date for OT Re-Evaluation 05/17/22    Authorization Type [place]    Authorization Time Period through 06/01/22- most likely approved for 15 visits still need to check    Authorization - Visit Number 9    Authorization - Number of Visits 15    OT Start Time 0850    OT Stop Time 0930    OT Time Calculation (min) 40 min                    Past Medical History:  Diagnosis Date   Acid reflux    Alcohol abuse    quit 10 y ago   Arthritis    BPH (benign prostatic hyperplasia)    CAD (coronary artery disease)    Cervical spondylosis without myelopathy 06/02/2015   COPD (chronic obstructive pulmonary disease) (HCC)    Emphysema lung (HCC)    GERD (gastroesophageal reflux disease)    High cholesterol    Hypertension    Parkinson's disease ([place])    PVD (peripheral vascular disease) ([place])    Tremor 06/02/2015   Past Surgical History:  Procedure Laterality Date   TONSILLECTOMY     VASECTOMY     Patient Active Problem List   Diagnosis Date Noted   Leukopenia 02/28/2022   Acquired deformities of toe(s), unspecified, unspecified foot 11/27/2021   Asthma 11/27/2021   Cervicalgia 11/27/2021   Corns and callosities 11/27/2021   Disturbance of skin sensation 11/27/2021   Elevated prostate specific antigen (PSA) 11/27/2021   Excessive attrition of teeth 11/27/2021   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   Family history of cancer 11/27/2021   Family history of malignant neoplasm of prostate 11/27/2021   Hyperlipidemia 11/27/2021   Iron deficiency anemia, unspecified 11/27/2021   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[place]) 11/27/2021   Myalgia 11/27/2021   Nail  dystrophy 11/27/2021   Benign neoplasm of other parts of mouth 11/27/2021   Open angle with borderline findings, high risk, left eye 11/27/2021   Other abnormal glucose 11/27/2021   Other reduced mobility 11/27/2021   Other secondary cataract, bilateral 11/27/2021   Pain in right thigh 11/27/2021   Pain in right knee 11/27/2021   Pain in unspecified shoulder 11/27/2021   Persons encountering health services in other specified circumstances 11/27/2021   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/27/2021   Right lower quadrant pain 11/27/2021   Tinnitus 11/27/2021   Trochanteric bursitis, right hip 11/27/2021   Unilateral primary osteoarthritis, right hip 11/27/2021   Unspecified fall, initial encounter 11/27/2021   Vitamin D deficiency, unspecified 11/27/2021   Weakness 11/27/2021   Constipation 08/28/2021   Chronic obstructive pulmonary disease ([place]) 05/31/2021   Depression 04/13/2021   Parkinson's disease ([name]) 02/28/2021   Weakness of both legs 02/01/2021   Dysphagia 02/01/2021   Weakness of right leg 01/06/2021   Lumbar radiculopathy 12/30/2020   Prostate cancer ([place]) 06/09/2020   Well adult exam 06/04/2019   BPH (benign prostatic hyperplasia) 06/04/2019   Bilateral carotid bruits 06/04/2019   Meniere disease, 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   Emphysema of lung ([place]) 06/04/2019   Spondylolisthesis at L4-L5 level 08/27/2017   Spinal stenosis in cervical  region 08/27/2017   Tremor 06/02/2015   Cervical spondylosis without myelopathy 06/02/2015   Alcoholism in remission ([place]) 06/09/2014   Coronary arteriosclerosis 07/30/2009   History of colonoscopy 07/30/2009   Neutropenia ([place]) 07/30/2008   HIP PAIN 11/27/2007   Essential hypertension 07/25/2007   Atrial fibrillation ([place]) 07/25/2007   GERD 07/25/2007   ERECTILE DYSFUNCTION 07/25/2007   LOW BACK PAIN 07/25/2007    ONSET DATE: 02/06/22-referral  date  REFERRING DIAG: Parkinson's disease; Z74.09,Z78.9 (ICD-10-CM) - Impaired mobility and ADLs   THERAPY DIAG:  Other symptoms and signs involving the nervous system  Other symptoms and signs involving the musculoskeletal system  Other lack of coordination  Unsteadiness on feet  Attention and concentration deficit  Frontal lobe and executive function deficit  Tremor  Abnormal posture  Other abnormalities of gait and mobility  SUBJECTIVE:   SUBJECTIVE STATEMENT: No pain Pt accompanied by: self  PERTINENT HISTORY:   Parkinson's disease--dx in March 2022 per pt report.    Other PMH:  neck and back pain, arthritis, hx of ETOH abuse, CAD, COPD, emphysema, GERD, HTN, high cholesterol, PVD, chronic vertigo per pt   PRECAUTIONS: Fall  WEIGHT BEARING RESTRICTIONS No  PAIN:  PAIN:  No pain   FALLS: Has patient fallen in last 6 months? multiple "lost count"   pt reports that he falls daily, but can usually catch himself--bumps and bruises  LIVING ENVIRONMENT: Lives with: lives alone.   Lives in: House/apartment Stairs: Yes: Internal: ) steps; none and External: 3 steps; can reach both Has following equipment at home: Single point cane  PLOF: Independent  PATIENT GOALS move better, stay independent   HAND DOMINANCE: Right  OBJECTIVE:   TODAY'S TREATMENT:  Supine closed chain chest press and shoulder flexion  PWR! Moves in supine, min v.c for amplitude(up, rock, twist, step ) x10 hip lifts and step broken down separately with min cueing for large amplitude movements.  Pt instructed in large amplitude movement strategies for donning/doffing jacket and returned demo x2 with min cueing initially with incr ease.      PATIENT EDUCATION: Education details: PWR moves supine Person educated: Patient Education method: Explanation verbal cues Education comprehension: verbalized understanding, returned demonstration  HOME EXERCISE PROGRAM: 03/19/22:  PWR! Hands  (basic 4) and coordination HEP with focus on large amplitude movements   03/26/22- PWR! moves supine basic 4 04/04/22- PWR moves seated, handout issued 04/05/22 04/05/22:  ADL strategies for flipping cards with big movements, and fastening buttons and opening containers big movements . Issued PWR! Moves basic 4 seated handout- did not perform today  04/09/22:  Reviewed Appropriate PD Community Resources (issued by PT); Ways to decr risk of future complications; Timing of medications and meals (info from [facility]); large amplitude movement strategies for donning/doffing jacket    ASSESSMENT: GOALS: Potential Goals reviewed with patient? Yes  SHORT TERM GOALS: Target date: 04/06/22- extended due to scheduling  Pt will be independent with PD-specific HEP.  Goal status: Met  2.  Pt will verbalize understanding of ways to decr risk of future complications and appropriate community resources. Goal status: met  3.  Baseline:Pt will report incr ease with grooming tasks and ability to use only RUE.  Goal status:met, per pt report  4.  Pt will demonstrate improved ease with fastening buttons as evidenced by decreasing 3 button/ unbutton time to:33 secs or less    Baseline: 35 Goal status: met 27.91  5 Pt will demonstrate improved LUE functional use as evidenced by increasing  box/ blocks score by 3 blocks.  Baseline: R 53, L 49 Goal status: ongoing,  RUE 49 , LUE 50  LONG TERM GOALS: Target date: 05/17/2022  Pt will verbalize understanding of adaptive strategies/AE to incr ease and safety with ADLs/IADLs.(Cutting food, opening containers) Goal status:met cutting food , opening containers  2.  Pt will demonstrate increased ease with dressing as evidenced by decreasing PPT#4(don/ doff jacket) to 13 secs or less Baseline: 15.88sec Goal status: ongoing   3.  Pt will be able to write at least 3 sentences with good legibility and only  no significant decr. In letter size Goal status:  ongoing  4.  Pt will improve balance/functional reach as shown by reaching at least 10" with each UE on standing functional reach test.  Baseline: R-8", L-9" Goal status: ongoing  CLINICAL IMPRESSION: Pt is progressing towards goals. Pt demonstrates good performance of PWR! Moves supine following review. Pt is progressing towards ADL strategies.  PERFORMANCE DEFICITS in functional skills including ADLs, IADLs, coordination, dexterity, tone, ROM, FMC, GMC, mobility, balance, decreased knowledge of precautions, decreased knowledge of use of DME, and UE functional use, cognitive skills including attention and memory, and psychosocial skills including habits and routines and behaviors.   IMPAIRMENTS are limiting patient from ADLs and IADLs.   COMORBIDITIES has co-morbidities such as CAD, COPD, HTN, hx of neck and back pain  that affects occupational performance. Patient will benefit from skilled OT to address above impairments and improve overall function.  MODIFICATION OR ASSISTANCE TO COMPLETE EVALUATION: Min-Moderate modification of tasks or assist with assess necessary to complete an evaluation.  OT OCCUPATIONAL PROFILE AND HISTORY: Detailed assessment: Review of records and additional review of physical, cognitive, psychosocial history related to current functional performance.  CLINICAL DECISION MAKING: Moderate - several treatment options, min-mod task modification necessary  REHAB POTENTIAL: Good  EVALUATION COMPLEXITY: Moderate   PLAN: OT FREQUENCY: 2x/week  OT DURATION: 12 weeks, may modify due to scheduling/progress  PLANNED INTERVENTIONS: self care/ADL training, therapeutic exercise, therapeutic activity, neuromuscular re-education, manual therapy, passive range of motion, balance training, functional mobility training, aquatic therapy, ultrasound, paraffin, fluidotherapy, moist heat, cryotherapy, patient/family education, cognitive remediation/compensation, visual/perceptual  remediation/compensation, energy conservation, coping strategies training, and DME and/or AE instructions  RECOMMENDED OTHER SERVICES: scheduled for evals with PT and ST  CONSULTED AND AGREED WITH PLAN OF CARE: Patient  PLAN FOR NEXT SESSION:  work towards unmet goals, functional reaching with large amplitude movements.   Teal Bontrager, OTR/L 04/11/2022, 9:12 AM  [facility] [address] [place], [place]  [address] [phone] phone [phone] 04/11/22 9:12 AM

## 2022-04-16 ENCOUNTER — Ambulatory Visit: Payer: No Typology Code available for payment source | Admitting: Occupational Therapy

## 2022-04-16 ENCOUNTER — Ambulatory Visit: Payer: No Typology Code available for payment source | Admitting: Physical Therapy

## 2022-04-16 ENCOUNTER — Ambulatory Visit: Payer: No Typology Code available for payment source | Admitting: Speech Pathology

## 2022-04-18 ENCOUNTER — Ambulatory Visit: Payer: No Typology Code available for payment source | Admitting: Occupational Therapy

## 2022-04-18 ENCOUNTER — Ambulatory Visit: Payer: No Typology Code available for payment source | Admitting: Physical Therapy

## 2022-04-18 ENCOUNTER — Encounter: Payer: Self-pay | Admitting: Physical Therapy

## 2022-04-18 ENCOUNTER — Encounter: Payer: Self-pay | Admitting: Speech Pathology

## 2022-04-18 ENCOUNTER — Ambulatory Visit: Payer: No Typology Code available for payment source | Admitting: Speech Pathology

## 2022-04-18 DIAGNOSIS — M6281 Muscle weakness (generalized): Secondary | ICD-10-CM | POA: Diagnosis not present

## 2022-04-18 DIAGNOSIS — R278 Other lack of coordination: Secondary | ICD-10-CM | POA: Diagnosis not present

## 2022-04-18 DIAGNOSIS — R29898 Other symptoms and signs involving the musculoskeletal system: Secondary | ICD-10-CM

## 2022-04-18 DIAGNOSIS — R471 Dysarthria and anarthria: Secondary | ICD-10-CM | POA: Diagnosis not present

## 2022-04-18 DIAGNOSIS — R2689 Other abnormalities of gait and mobility: Secondary | ICD-10-CM

## 2022-04-18 DIAGNOSIS — R2681 Unsteadiness on feet: Secondary | ICD-10-CM | POA: Diagnosis not present

## 2022-04-18 DIAGNOSIS — R29818 Other symptoms and signs involving the nervous system: Secondary | ICD-10-CM

## 2022-04-18 DIAGNOSIS — R251 Tremor, unspecified: Secondary | ICD-10-CM | POA: Diagnosis not present

## 2022-04-18 DIAGNOSIS — R41844 Frontal lobe and executive function deficit: Secondary | ICD-10-CM

## 2022-04-18 DIAGNOSIS — R293 Abnormal posture: Secondary | ICD-10-CM | POA: Diagnosis not present

## 2022-04-18 DIAGNOSIS — R41840 Attention and concentration deficit: Secondary | ICD-10-CM

## 2022-04-18 NOTE — Therapy (Signed)
OUTPATIENT PHYSICAL THERAPY NEURO TREATMENT/DISCHARGE SUMMARY   Patient Name: Lee [name] MRN: [id number] DOB:[birth year], 76 y.o., male Today's Date: 04/18/2022   PCP: Cassandria Anger, MD REFERRING PROVIDER: Harvie Heck, MD    PT End of Session - 04/18/22 1104     Visit Number 15    Number of Visits 16    Date for PT Re-Evaluation 05/22/22    Authorization Type VA - 15 visits approved from 12/29/21 - 04/28/22    Authorization - Visit Number 13    Authorization - Number of Visits 15    PT Start Time 1103    PT Stop Time 1139   full time not used due to D/C visit   PT Time Calculation (min) 36 min    Equipment Utilized During Treatment Gait belt    Activity Tolerance Patient tolerated treatment well    Behavior During Therapy WFL for tasks assessed/performed                 Past Medical History:  Diagnosis Date   Acid reflux    Alcohol abuse    quit 10 y ago   Arthritis    BPH (benign prostatic hyperplasia)    CAD (coronary artery disease)    Cervical spondylosis without myelopathy 06/02/2015   COPD (chronic obstructive pulmonary disease) ([place])    Emphysema lung ([place])    GERD (gastroesophageal reflux disease)    High cholesterol    Hypertension    Parkinson's disease ([place])    PVD (peripheral vascular disease) ([place])    Tremor 06/02/2015   Past Surgical History:  Procedure Laterality Date   TONSILLECTOMY     VASECTOMY     Patient Active Problem List   Diagnosis Date Noted   Leukopenia 02/28/2022   Acquired deformities of toe(s), unspecified, unspecified foot 11/27/2021   Asthma 11/27/2021   Cervicalgia 11/27/2021   Corns and callosities 11/27/2021   Disturbance of skin sensation 11/27/2021   Elevated prostate specific antigen (PSA) 11/27/2021   Excessive attrition of teeth 11/27/2021   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   Family history of cancer 11/27/2021   Family history of malignant neoplasm of prostate 11/27/2021    Hyperlipidemia 11/27/2021   Iron deficiency anemia, unspecified 11/27/2021   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[place]) 11/27/2021   Myalgia 11/27/2021   Nail dystrophy 11/27/2021   Benign neoplasm of other parts of mouth 11/27/2021   Open angle with borderline findings, high risk, left eye 11/27/2021   Other abnormal glucose 11/27/2021   Other reduced mobility 11/27/2021   Other secondary cataract, bilateral 11/27/2021   Pain in right thigh 11/27/2021   Pain in right knee 11/27/2021   Pain in unspecified shoulder 11/27/2021   Persons encountering health services in other specified circumstances 11/27/2021   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/27/2021   Right lower quadrant pain 11/27/2021   Tinnitus 11/27/2021   Trochanteric bursitis, right hip 11/27/2021   Unilateral primary osteoarthritis, right hip 11/27/2021   Unspecified fall, initial encounter 11/27/2021   Vitamin D deficiency, unspecified 11/27/2021   Weakness 11/27/2021   Constipation 08/28/2021   Chronic obstructive pulmonary disease ([place]) 05/31/2021   Depression 04/13/2021   Parkinson's disease ([place]) 02/28/2021   Weakness of both legs 02/01/2021   Dysphagia 02/01/2021   Weakness of right leg 01/06/2021   Lumbar radiculopathy 12/30/2020   Prostate cancer ([place]) 06/09/2020   Well adult exam 06/04/2019   BPH (benign prostatic hyperplasia) 06/04/2019  Bilateral carotid bruits 06/04/2019   Meniere disease, 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   Emphysema of lung ([place]) 06/04/2019   Spondylolisthesis at L4-L5 level 08/27/2017   Spinal stenosis in cervical region 08/27/2017   Tremor 06/02/2015   Cervical spondylosis without myelopathy 06/02/2015   Alcoholism in remission ([place]) 06/09/2014   Coronary arteriosclerosis 07/30/2009   History of colonoscopy 07/30/2009   Neutropenia ([place]) 07/30/2008   HIP PAIN 11/27/2007   Essential hypertension 07/25/2007    Atrial fibrillation ([place]) 07/25/2007   GERD 07/25/2007   ERECTILE DYSFUNCTION 07/25/2007   LOW BACK PAIN 07/25/2007    ONSET DATE: 12/29/2021   REFERRING DIAG: G20 (ICD-10-CM) - Parkinson disease ([place])   THERAPY DIAG:  Other symptoms and signs involving the nervous system  Unsteadiness on feet  Other abnormalities of gait and mobility  Rationale for Evaluation and Treatment Rehabilitation  PERTINENT HISTORY: PMH:  neck and back pain, PD -dx in March 2022 per pt report, hx of ETOH abuse, CAD, COPD, emphysema, GERD, HTN, high cholesterol, PVD, pt with hx of being non-compliant with meds  PATIENT GOALS Wants to get rid of the PD and go back to where he was 2-3 years ago. Get back to walking in the park.    PRECAUTIONS: Fall   SUBJECTIVE: Missed the other day due to not sleeping well. Still has some stumbles, but is able to catch himself. Ready to wrap up today. Got a seated stepper/peddler for home.                                                                                                                                                                                            Pt accompanied by: self   PAIN:  Are you having pain? Yes: NPRS scale: 3/10 Pain location: Low back Pain description: Dull Aggravating factors: Depends how his body is like, pressure Relieving factors: Sitting up taller, tumeric    VITALS There were no vitals filed for this visit.     TODAY'S TREATMENT:  Goal Assessment: 5x sit <> stand: with BUE support: 16.72 seconds, without UE support: 13.66 seconds Gait speed with SPC: 13.1 seconds = 2.50 ft/sec Push and release test: Anterior and posterior = 1 step    [facility] PT Assessment - 04/18/22 1122       Timed Up and Go Test   TUG Cognitive TUG;Normal TUG    Normal TUG (seconds) 13.66   with SPC, improvement in turning technique.   Cognitive TUG (seconds) 12.41   with SPC, counted backwards by 1, unable to do 3s            Therapeutic Activity: Discussed scheduling  for PD screens for all 3 disciplines in 6 months with pt in agreement. Pt reporting that he wants to go back to walking outside at [place]. Discussed that he can use a rollator safely when outside for longer distances (have practiced in clinic before and pt verbalizes understanding of being able to get the order from his physician).    GAIT: Gait pattern: step through pattern, decreased arm swing- Left, decreased step length- Right, decreased step length- Left, decreased stride length, festinating, and trunk flexed Distance walked: Various clinic distances with cane.  Assistive device utilized: Single point cane Level of assistance: SBA Comments: Gait with cane during session, min cues to remind pt of rocking technique initially in standing to initiate gait and using a marching turn for improved foot clearance.    Access Code: [email or web address] URL: [email or web address] Date: 04/18/2022 Prepared by: Janann August  Reviewed final HEP for balance and provided new handout (per pt request). See MedBridge for more details. Did not practice PWR moves as pt reports that he does not have issues with these.   Exercises - Alternating Step Backward with Support  - 1 x daily - 5 x weekly - 1 sets - 10 reps - Alternating Step Forward with Support  - 1 x daily - 5 x weekly - 1 sets - 10 reps - Step Sideways  - 1 x daily - 5 x weekly - 1 sets - 10 reps - Standing Quarter Turn with Counter Support  - 1 x daily - 5 x weekly - 1 sets - 5 reps - Sit to Stand Without Arm Support  - 1 x daily - 7 x weekly - 2 sets - 10 reps    PHYSICAL THERAPY DISCHARGE SUMMARY  Visits from Start of Care: 15  Current functional level related to goals / functional outcomes: See LTGs/clinical assessment statement   Remaining deficits: Impaired timing/coordination of gait, bradykinesia, impaired balance, gait abnormalities    Education / Equipment: HEP, fall  prevention, community resources, strategies to help with freezing/festination.    Patient agrees to discharge. Patient goals were partially met. Patient is being discharged due to meeting the stated rehab goals. And meeting max potential with OPPT at this time.    PATIENT EDUCATION: Education details: See therapeutic activity section above. D/C from PT. Importance of performing HEP at home going forwards.  Person educated: Patient Education method: Theatre stage manager Education comprehension: verbalized understanding and returned demonstration   HOME EXERCISE PROGRAM: [email or web address], Seated and Standing PWR moves      GOALS: Goals reviewed with patient? Yes  SHORT TERM GOALS: Target date: 03/21/2022  Pt will be independent with initial HEP for improved strength, balance, transfers, and gait.  Baseline: Pt not performing regularly  Goal status: IN PROGRESS  2.  Pt will verbalize tips to reduce festination/freezing with gait and turns.  Baseline: Pt unable to teach therapist tips when asked. However, when therapist attempts to educate pt, pt claims he is already integrating tips and does not need education  Goal status: IN PROGRESS  3.  Pt will verbalize understanding of local Parkinson's disease resources.  Baseline:  Goal status: IN PROGRESS  4.  Push and release test to be assessed with LTG written for improved balance recovery. Baseline: Anterior = 4-5 small steps Posterior = 5-6 small steps, min A for steadying Lateral = unable to elicit a step in either direction.  Goal status: MET  5.   Pt will improve 5x sit<>stand with BUE support to less  than or equal to 22 sec to demonstrate improved functional strength and transfer efficiency.  Baseline: 26.57 seconds with BUE support; 13.85s without UE support   Goal status: MET    LONG TERM GOALS: Target date: 04/18/2022  Pt will be independent with final HEP for improved strength, balance, transfers, and gait.  Baseline:   Goal status: PARTIALLY MET  2.   Pt will improve gait velocity to at least 2.4 ft/sec with LRAD for improved gait efficiency and safety.   Baseline: 14.96 seconds with SPC = 2.19 ft/sec;  13.1 seconds = 2.50 ft/sec on 04/18/22 Goal status: MET  3.   Pt will ambulate at least 500' ft indoor and outdoor surfaces, appropriate assistive device with supervision for imrpoved community gait.   Baseline: did not get to assess on 04/18/22 due to time constraints  Goal status: DEFERRED   4.  Pt will recover posterior and anterior balance in push and release test in 2-3 or less steps independently, for improved balance recovery.   Baseline: Anterior = 4-5 small steps Posterior = 5-6 small steps, min A for steadying  On 04/18/22; 1 step in posterior and anterior directions  Goal status: MET  5.  Pt will improve 5x sit<>stand with BUE support to less than or equal to 11.5 sec to demonstrate improved functional strength and transfer efficiency. Baseline: 26.57 seconds with BUE support; 13.85s on 8/23; 13.66 seconds on 04/18/22 with no UE support  Goal status: NOT MET  6.  Pt will improve cog TUG to 17 seconds or less in order to demo improved dual tasking/decr fall risk.  Baseline: 22.38 sec with SPC; 12.41 seconds with SPC on 04/18/22  Goal status: MET    ASSESSMENT:  CLINICAL IMPRESSION: Today's was pt's last scheduled appt. Assessed goals for anticipated D/C. Pt has met 3 out of 6 LTGs. Pt with improved stepping strategies, cog TUG indicating improved dual tasking, and improved gait speed with cane. Pt did not meet LTG #5 and partially met LTG #1. Did not have time to assess LTG #3 during final visit. Reviewed with pt final HEP and importance of continuing to perform going forwards. Will plan to schedule return PD screens in 6 months for all 3 disciplines with pt in agreement with plan. Will D/C at this time due to running out of approved visits and pt pleased with his progress in therapy at this  time/in agreement to D/C.   OBJECTIVE IMPAIRMENTS Abnormal gait, decreased activity tolerance, decreased balance, decreased cognition, decreased coordination, decreased endurance, decreased knowledge of use of DME, difficulty walking, decreased strength, decreased safety awareness, impaired flexibility, impaired sensation, impaired tone, postural dysfunction, and pain.   ACTIVITY LIMITATIONS stairs, transfers, bed mobility, and locomotion level  PARTICIPATION LIMITATIONS: cleaning and community activity  PERSONAL FACTORS Behavior pattern, Past/current experiences, Time since onset of injury/illness/exacerbation, and 3+ comorbidities: neck and back pain, PD -dx in March 2022 per pt report, hx of ETOH abuse, CAD, COPD, emphysema, GERD, HTN, high cholesterol, PVD  are also affecting patient's functional outcome.   REHAB POTENTIAL: Good  CLINICAL DECISION MAKING: Evolving/moderate complexity  EVALUATION COMPLEXITY: Moderate  PLAN: PT FREQUENCY: 2x/week  PT DURATION: 12 weeks  PLANNED INTERVENTIONS: Therapeutic exercises, Therapeutic activity, Neuromuscular re-education, Balance training, Gait training, Patient/Family education, Self Care, Stair training, DME instructions, and Re-evaluation  PLAN FOR NEXT SESSION:  D/C from PT, PD screens in 6 months.   Janann August, PT, DPT 04/18/22 12:10 PM

## 2022-04-18 NOTE — Therapy (Unsigned)
OUTPATIENT OCCUPATIONAL THERAPY PARKINSON'S TREATMENT  Patient Name: Lee [name] MRN: [id number] DOB:[birth year], 76 y.o., male Today's Date: 04/19/2022  PCP: Dr. Lew Dawes REFERRING PROVIDER: Dr. Landry Mellow   OT End of Session - 04/18/22 0945     Visit Number 10    Number of Visits 25    Date for OT Re-Evaluation 05/17/22    Authorization Type [place]    Authorization Time Period through 06/01/22- most likely approved for 15 visits still need to check    Authorization - Visit Number 10    Authorization - Number of Visits 15    OT Start Time 0940    OT Stop Time 1015    OT Time Calculation (min) 35 min    Activity Tolerance Patient tolerated treatment well    Behavior During Therapy [facility] for tasks assessed/performed                     Past Medical History:  Diagnosis Date   Acid reflux    Alcohol abuse    quit 10 y ago   Arthritis    BPH (benign prostatic hyperplasia)    CAD (coronary artery disease)    Cervical spondylosis without myelopathy 06/02/2015   COPD (chronic obstructive pulmonary disease) (HCC)    Emphysema lung (HCC)    GERD (gastroesophageal reflux disease)    High cholesterol    Hypertension    Parkinson's disease ([place])    PVD (peripheral vascular disease) ([place])    Tremor 06/02/2015   Past Surgical History:  Procedure Laterality Date   TONSILLECTOMY     VASECTOMY     Patient Active Problem List   Diagnosis Date Noted   Leukopenia 02/28/2022   Acquired deformities of toe(s), unspecified, unspecified foot 11/27/2021   Asthma 11/27/2021   Cervicalgia 11/27/2021   Corns and callosities 11/27/2021   Disturbance of skin sensation 11/27/2021   Elevated prostate specific antigen (PSA) 11/27/2021   Excessive attrition of teeth 11/27/2021   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   Family history of cancer 11/27/2021   Family history of malignant neoplasm of prostate 11/27/2021   Hyperlipidemia 11/27/2021   Iron deficiency  anemia, unspecified 11/27/2021   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[place]) 11/27/2021   Myalgia 11/27/2021   Nail dystrophy 11/27/2021   Benign neoplasm of other parts of mouth 11/27/2021   Open angle with borderline findings, high risk, left eye 11/27/2021   Other abnormal glucose 11/27/2021   Other reduced mobility 11/27/2021   Other secondary cataract, bilateral 11/27/2021   Pain in right thigh 11/27/2021   Pain in right knee 11/27/2021   Pain in unspecified shoulder 11/27/2021   Persons encountering health services in other specified circumstances 11/27/2021   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/27/2021   Right lower quadrant pain 11/27/2021   Tinnitus 11/27/2021   Trochanteric bursitis, right hip 11/27/2021   Unilateral primary osteoarthritis, right hip 11/27/2021   Unspecified fall, initial encounter 11/27/2021   Vitamin D deficiency, unspecified 11/27/2021   Weakness 11/27/2021   Constipation 08/28/2021   Chronic obstructive pulmonary disease ([place]) 05/31/2021   Depression 04/13/2021   Parkinson's disease ([place]) 02/28/2021   Weakness of both legs 02/01/2021   Dysphagia 02/01/2021   Weakness of right leg 01/06/2021   Lumbar radiculopathy 12/30/2020   Prostate cancer ([place]) 06/09/2020   Well adult exam 06/04/2019   BPH (benign prostatic hyperplasia) 06/04/2019   Bilateral carotid bruits 06/04/2019   Meniere disease, 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  Emphysema of lung ([place]) 06/04/2019   Spondylolisthesis at L4-L5 level 08/27/2017   Spinal stenosis in cervical region 08/27/2017   Tremor 06/02/2015   Cervical spondylosis without myelopathy 06/02/2015   Alcoholism in remission ([place]) 06/09/2014   Coronary arteriosclerosis 07/30/2009   History of colonoscopy 07/30/2009   Neutropenia ([place]) 07/30/2008   HIP PAIN 11/27/2007   Essential hypertension 07/25/2007   Atrial fibrillation ([place]) 07/25/2007   GERD  07/25/2007   ERECTILE DYSFUNCTION 07/25/2007   LOW BACK PAIN 07/25/2007    ONSET DATE: 02/06/22-referral date  REFERRING DIAG: Parkinson's disease; Z74.09,Z78.9 (ICD-10-CM) - Impaired mobility and ADLs   THERAPY DIAG:  Other symptoms and signs involving the nervous system  Other symptoms and signs involving the musculoskeletal system  Other lack of coordination  Unsteadiness on feet  Attention and concentration deficit  Frontal lobe and executive function deficit  Tremor  Abnormal posture  Other abnormalities of gait and mobility  SUBJECTIVE:   SUBJECTIVE STATEMENT: No pain Pt accompanied by: self  PERTINENT HISTORY:   Parkinson's disease--dx in March 2022 per pt report.    Other PMH:  neck and back pain, arthritis, hx of ETOH abuse, CAD, COPD, emphysema, GERD, HTN, high cholesterol, PVD, chronic vertigo per pt   PRECAUTIONS: Fall  WEIGHT BEARING RESTRICTIONS No  PAIN:  PAIN:  No pain   FALLS: Has patient fallen in last 6 months? multiple "lost count"   pt reports that he falls daily, but can usually catch himself--bumps and bruises  LIVING ENVIRONMENT: Lives with: lives alone.   Lives in: House/apartment Stairs: Yes: Internal: ) steps; none and External: 3 steps; can reach both Has following equipment at home: Single point cane  PLOF: Independent  PATIENT GOALS move better, stay independent   HAND DOMINANCE: Right  OBJECTIVE:   TODAY'S TREATMENT:    PWR! Moves in seated min v.c for amplitude(up, rock, twist, step ) with min cueing for large amplitude movements.  Dynamic step and reach with right and left UE's min v.c for amplitude, increased time required  Flipping playing cards with larger amplitude movements, min v.c     PATIENT EDUCATION: Education details: PWR moves seated Person educated: Patient Education method: Explanation verbal cues Education comprehension: verbalized understanding, returned demonstration  HOME EXERCISE  PROGRAM: 03/19/22:  PWR! Hands (basic 4) and coordination HEP with focus on large amplitude movements   03/26/22- PWR! moves supine basic 4 04/04/22- PWR moves seated, handout issued 04/05/22 04/05/22:  ADL strategies for flipping cards with big movements, and fastening buttons and opening containers big movements . Issued PWR! Moves basic 4 seated handout- did not perform today  04/09/22:  Reviewed Appropriate PD Community Resources (issued by PT); Ways to decr risk of future complications; Timing of medications and meals (info from [facility]); large amplitude movement strategies for donning/doffing jacket    ASSESSMENT: GOALS: Potential Goals reviewed with patient? Yes  SHORT TERM GOALS: Target date: 04/06/22- extended due to scheduling  Pt will be independent with PD-specific HEP.  Goal status: Met  2.  Pt will verbalize understanding of ways to decr risk of future complications and appropriate community resources. Goal status: met  3.  Baseline:Pt will report incr ease with grooming tasks and ability to use only RUE.  Goal status:met, per pt report  4.  Pt will demonstrate improved ease with fastening buttons as evidenced by decreasing 3 button/ unbutton time to:33 secs or less    Baseline: 35 Goal status: met 27.91  5 Pt will demonstrate improved  LUE functional use as evidenced by increasing box/ blocks score by 3 blocks.  Baseline: R 53, L 49 Goal status: ongoing,  RUE 49 , LUE 50  LONG TERM GOALS: Target date: 05/17/2022  Pt will verbalize understanding of adaptive strategies/AE to incr ease and safety with ADLs/IADLs.(Cutting food, opening containers) Goal status:met cutting food , opening containers  2.  Pt will demonstrate increased ease with dressing as evidenced by decreasing PPT#4(don/ doff jacket) to 13 secs or less Baseline: 15.88sec Goal status: MET  11.56  3.  Pt will be able to write at least 3 sentences with good legibility and only  no significant decr. In  letter size Goal status: ongoing  4.  Pt will improve balance/functional reach as shown by reaching at least 10" with each UE on standing functional reach test.  Baseline: R-8", L-9" Goal status: ongoing  CLINICAL IMPRESSION: For the reporting period of 7/26- 04/18/22 pt has met 2/4 short term goals, 2/4 long term goals  and 2/4 long term goals. Pt is progressing towards all remaining goals. He can benefit from continued skilled occupational therapy to maximize pt safety and I with ADLs and to maximize quality of life and to address performance deficits. PERFORMANCE DEFICITS in functional skills including ADLs, IADLs, coordination, dexterity, tone, ROM, FMC, GMC, mobility, balance, decreased knowledge of precautions, decreased knowledge of use of DME, and UE functional use, cognitive skills including attention and memory, and psychosocial skills including habits and routines and behaviors.   IMPAIRMENTS are limiting patient from ADLs and IADLs.   COMORBIDITIES has co-morbidities such as CAD, COPD, HTN, hx of neck and back pain  that affects occupational performance. Patient will benefit from skilled OT to address above impairments and improve overall function.  MODIFICATION OR ASSISTANCE TO COMPLETE EVALUATION: Min-Moderate modification of tasks or assist with assess necessary to complete an evaluation.  OT OCCUPATIONAL PROFILE AND HISTORY: Detailed assessment: Review of records and additional review of physical, cognitive, psychosocial history related to current functional performance.  CLINICAL DECISION MAKING: Moderate - several treatment options, min-mod task modification necessary  REHAB POTENTIAL: Good  EVALUATION COMPLEXITY: Moderate   PLAN: OT FREQUENCY: 2x/week  OT DURATION: 12 weeks, may modify due to scheduling/progress  PLANNED INTERVENTIONS: self care/ADL training, therapeutic exercise, therapeutic activity, neuromuscular re-education, manual therapy, passive range of  motion, balance training, functional mobility training, aquatic therapy, ultrasound, paraffin, fluidotherapy, moist heat, cryotherapy, patient/family education, cognitive remediation/compensation, visual/perceptual remediation/compensation, energy conservation, coping strategies training, and DME and/or AE instructions  RECOMMENDED OTHER SERVICES: scheduled for evals with PT and ST  CONSULTED AND AGREED WITH PLAN OF CARE: Patient  PLAN FOR NEXT SESSION:  Pt is scheduled through first week of October, work towards unmet goals, functional reaching with large amplitude movements.   Duston Smolenski, OTR/L 04/19/2022, 4:42 PM  [facility] [place] [place], [place]  [id number] [phone] phone [phone] 04/19/22 4:42 PM

## 2022-04-18 NOTE — Therapy (Signed)
OUTPATIENT SPEECH LANGUAGE PATHOLOGY TREATMENT NOTE   Patient Name: Lee [name] MRN: [id number] DOB:1945-11-11, 76 y.o., male Today's Date: 04/18/2022  PCP: Dr. Lew Dawes  REFERRING PROVIDER: Harvie Heck, MD   END OF SESSION:   End of Session - 04/18/22 1023     Visit Number 13    Number of Visits 25    Date for SLP Re-Evaluation 05/16/22    SLP Start Time 1015    SLP Stop Time  1100    SLP Time Calculation (min) 45 min    Activity Tolerance Patient tolerated treatment well;Other (comment)                  Past Medical History:  Diagnosis Date   Acid reflux    Alcohol abuse    quit 10 y ago   Arthritis    BPH (benign prostatic hyperplasia)    CAD (coronary artery disease)    Cervical spondylosis without myelopathy 06/02/2015   COPD (chronic obstructive pulmonary disease) (HCC)    Emphysema lung (HCC)    GERD (gastroesophageal reflux disease)    High cholesterol    Hypertension    Parkinson's disease ([place])    PVD (peripheral vascular disease) ([place])    Tremor 06/02/2015   Past Surgical History:  Procedure Laterality Date   TONSILLECTOMY     VASECTOMY     Patient Active Problem List   Diagnosis Date Noted   Leukopenia 02/28/2022   Acquired deformities of toe(s), unspecified, unspecified foot 11/27/2021   Asthma 11/27/2021   Cervicalgia 11/27/2021   Corns and callosities 11/27/2021   Disturbance of skin sensation 11/27/2021   Elevated prostate specific antigen (PSA) 11/27/2021   Excessive attrition of teeth 11/27/2021   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   Family history of cancer 11/27/2021   Family history of malignant neoplasm of prostate 11/27/2021   Hyperlipidemia 11/27/2021   Iron deficiency anemia, unspecified 11/27/2021   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/27/2021   Myalgia 11/27/2021   Nail dystrophy 11/27/2021   Benign neoplasm of other parts of mouth 11/27/2021   Open angle with borderline findings,  high risk, left eye 11/27/2021   Other abnormal glucose 11/27/2021   Other reduced mobility 11/27/2021   Other secondary cataract, bilateral 11/27/2021   Pain in right thigh 11/27/2021   Pain in right knee 11/27/2021   Pain in unspecified shoulder 11/27/2021   Persons encountering health services in other specified circumstances 11/27/2021   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/27/2021   Right lower quadrant pain 11/27/2021   Tinnitus 11/27/2021   Trochanteric bursitis, right hip 11/27/2021   Unilateral primary osteoarthritis, right hip 11/27/2021   Unspecified fall, initial encounter 11/27/2021   Vitamin D deficiency, unspecified 11/27/2021   Weakness 11/27/2021   Constipation 08/28/2021   Chronic obstructive pulmonary disease ([place]) 05/31/2021   Depression 04/13/2021   Parkinson's disease ([place]) 02/28/2021   Weakness of both legs 02/01/2021   Dysphagia 02/01/2021   Weakness of right leg 01/06/2021   Lumbar radiculopathy 12/30/2020   Prostate cancer ([place]) 06/09/2020   Well adult exam 06/04/2019   BPH (benign prostatic hyperplasia) 06/04/2019   Bilateral carotid bruits 06/04/2019   Meniere disease, 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   Emphysema of lung ([place]) 06/04/2019   Spondylolisthesis at L4-L5 level 08/27/2017   Spinal stenosis in cervical region 08/27/2017   Tremor 06/02/2015   Cervical spondylosis without myelopathy 06/02/2015   Alcoholism in remission ([place]) 06/09/2014   Coronary  arteriosclerosis 07/30/2009   History of colonoscopy 07/30/2009   Neutropenia ([place]) 07/30/2008   HIP PAIN 11/27/2007   Essential hypertension 07/25/2007   Atrial fibrillation ([place]) 07/25/2007   GERD 07/25/2007   ERECTILE DYSFUNCTION 07/25/2007   LOW BACK PAIN 07/25/2007    ONSET DATE: March 2022 PD dx  REFERRING DIAG:  R13.10 (ICD-10-CM) - Dysphagia   THERAPY DIAG:  Dysarthria and anarthria  Rationale for Evaluation and  Treatment Rehabilitation  SUBJECTIVE: "They didn't tell me to take it on an empty stomach"    PAIN:  Are you having pain? Yes  R hip 4/10  OBJECTIVE:   TODAY'S TREATMENT:   04-18-22: [name] reported he practiced syllables and military cadences for HW, instead of Speak Out! Work book. Initial volume 68dB. Reviewed speaking with intent. SLP facilitated Speak Out lesson 178to target improved vocal intensity and clarity over increasingly complex structured tasks. Averages this date: sustained, loud "ah": 92 dB; counting 80 dB; reading (paragraphs) 76 dB; cognitive exercise 71dB. Simple conversation averaged 68dB ( 65-73dB) with  3 occasional min verbal and visual cues. In conversation, [name] independently noted 1x that he was speaking too low and self corrected.   04-11-22: Pt enters with volume averaging 64 dB across 10 minute unstructured conversation with usual verbal cues for using intentional speech. Pt able to teach back principles of intent, and why relevant for improved speech clarity for speaking with PD, with usual mod-A, through questioning cues. SLP had pt ID definition of "intent" which resonated with him for use as visual cue in structured practice: "concentrate." SLP facilitated Speak Out lesson 17 to target improved vocal intensity and clarity over increasingly complex structured tasks. Averages this date: sustained, loud "ah": 92 dB; counting 78 dB; reading (paragraphs) 76 dB; cognitive exercise 69 dB. Pt reporting questions about how best to take his medications, having just learned one medication should be taken without food. Generated medication chart for pt to fill in with provider re: medications, what they are for, and how to take. Pt to take to upcoming doctor's appointment. SLP suggested this would be helpful to have as reference, kept with medications to optimize medication administration adherence.   04-09-22: Target improving intelligibility and vocal intensity using Speak Out!  lesson 16 Occasional , faded to rare, min-A to meet desired dB levels and complete exercises accurately during structured exercises. Averages as follows: 92 dB loud "ah"; counting 84 dB; reading 75dB; cognitive exercise 65 dB. Usual mod-A to use of intensity across cognitive exercise trials. In simple conversation,  Pt averages 68 dB with usual verbal cues With visual cues,  [name] self-corrected low volume x3. No carryover of volume or intent when speaking on phone call he took during [place]: Education details: See above (and patient instructions) Person educated: Patient Education method: Explanation, Demonstration, Verbal cues, and Handouts Education comprehension: verbalized understanding, returned demonstration, verbal cues required, and needs further education     HOME EXERCISE PROGRAM: Speak Out     GOALS: Goals reviewed with patient? Yes   SHORT TERM GOALS: Target date: 03/26/22 Pt will complete HEP for dysarthria with occasional min A over 2 sessions Baseline:  Goal status: Not met   2.  Pt will average 70dB 18/20 sentences with rare min A over 2 sessions Baseline: 03-07-22 Goal status: Met   3.  Pt will average 70dB over 8 minute simple conversation with occasional min A over 2 sessions Baseline:  Goal status: Met   4.  Pt will  complete MBSS Baseline:  Goal status: Met   5.  Pt will follow diet modifications and swallow precautions after MBSS with occasional min A Baseline:  Goal status: deferred      LONG TERM GOALS: Target date: 05/16/22   Pt will complete HEP for dysarthria with rare min A over 2 sessions Baseline:  Goal status: ongoing   2.  Pt will complete HEP for dysphagia if indicated s/p MBSS with occasional min A Baseline:  Goal status: deferred, WFL swallow   3.  Pt will average 70 dB during 15 minute conversation with rare min A over 2 sessions Baseline:  Goal status: ongoing   4.  Pt will improve score on Communicative  Effectiveness Survey Baseline: 13/32 Goal status: ongoing      ASSESSMENT:   CLINICAL IMPRESSION: Patient is a 76 y.o. male who was seen today for dysphagia and dysarthria. Conducted ongoing education and training of recommended swallow precautions to optimize safety during swallowing as well as Speak Out! Program to maximize patient vocal intensity and clarity. Pt is achieving volume targets on Speak Out exercises with usual mod to max A initially, with inconsistent carryover in simple conversation in [place] room, however limited carryover outside of ST or on phone.Recommend skilled ST to maximize intelligibility for safety, QOL and safety of swallow.    OBJECTIVE IMPAIRMENTS  Objective impairments include memory, dysphagia, and dysarthria. These impairments are limiting patient from effectively communicating at home and in community and safety when swallowing. Factors affecting potential to achieve goals and functional outcome are medical prognosis. Patient will benefit from skilled SLP services to address above impairments and improve overall function.   REHAB POTENTIAL: Good   PLAN: SLP FREQUENCY: 2x/week   SLP DURATION: 12 weeks   PLANNED INTERVENTIONS: Aspiration precaution training, Pharyngeal strengthening exercises, Diet toleration management , Language facilitation, Environmental controls, Trials of upgraded texture/liquids, Cueing hierachy, Cognitive reorganization, Internal/external aids, Functional tasks, Multimodal communication approach, and SLP instruction and feedback    [place], Annye Rusk, [place] 04/18/2022, 11:00 AM

## 2022-04-22 NOTE — Therapy (Signed)
OUTPATIENT OCCUPATIONAL THERAPY [facility]  Patient Name: Lee [name] MRN: [id number] DOB:April 20, 1946, 76 y.o., male Today's Date: 04/23/2022  PCP: Dr. Lew Dawes REFERRING PROVIDER: Dr. Landry Mellow   OT End of Session - 04/23/22 1023     Visit Number 11    Number of Visits 25    Date for OT Re-Evaluation 05/17/22    Authorization Type [place]    Authorization Time Period through 06/01/22- most likely approved for 15 visits still need to check    Authorization - Visit Number 11    Authorization - Number of Visits 15    Progress Note Due on Visit 20    OT Start Time 1020    OT Stop Time 1100    OT Time Calculation (min) 40 min    Activity Tolerance Patient tolerated treatment well    Behavior During Therapy [facility] for tasks assessed/performed                      Past Medical History:  Diagnosis Date   Acid reflux    Alcohol abuse    quit 10 y ago   Arthritis    BPH (benign prostatic hyperplasia)    CAD (coronary artery disease)    Cervical spondylosis without myelopathy 06/02/2015   COPD (chronic obstructive pulmonary disease) ([place])    Emphysema lung ([place])    GERD (gastroesophageal reflux disease)    High cholesterol    Hypertension    Parkinson's disease ([place])    PVD (peripheral vascular disease) ([place])    Tremor 06/02/2015   Past Surgical History:  Procedure Laterality Date   TONSILLECTOMY     VASECTOMY     Patient Active Problem List   Diagnosis Date Noted   Leukopenia 02/28/2022   Acquired deformities of toe(s), unspecified, unspecified foot 11/27/2021   Asthma 11/27/2021   Cervicalgia 11/27/2021   Corns and callosities 11/27/2021   Disturbance of skin sensation 11/27/2021   Elevated prostate specific antigen (PSA) 11/27/2021   Excessive attrition of teeth 11/27/2021   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   Family history of cancer 11/27/2021   Family history of malignant neoplasm of prostate 11/27/2021    Hyperlipidemia 11/27/2021   Iron deficiency anemia, unspecified 11/27/2021   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[employer]) 11/27/2021   Myalgia 11/27/2021   Nail dystrophy 11/27/2021   Benign neoplasm of other parts of mouth 11/27/2021   Open angle with borderline findings, high risk, left eye 11/27/2021   Other abnormal glucose 11/27/2021   Other reduced mobility 11/27/2021   Other secondary cataract, bilateral 11/27/2021   Pain in right thigh 11/27/2021   Pain in right knee 11/27/2021   Pain in unspecified shoulder 11/27/2021   Persons encountering health services in other specified circumstances 11/27/2021   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/27/2021   Right lower quadrant pain 11/27/2021   Tinnitus 11/27/2021   Trochanteric bursitis, right hip 11/27/2021   Unilateral primary osteoarthritis, right hip 11/27/2021   Unspecified fall, initial encounter 11/27/2021   Vitamin D deficiency, unspecified 11/27/2021   Weakness 11/27/2021   Constipation 08/28/2021   Chronic obstructive pulmonary disease ([place]) 05/31/2021   Depression 04/13/2021   Parkinson's disease ([place]) 02/28/2021   Weakness of both legs 02/01/2021   Dysphagia 02/01/2021   Weakness of right leg 01/06/2021   Lumbar radiculopathy 12/30/2020   Prostate cancer ([place]) 06/09/2020   Well adult exam 06/04/2019   BPH (benign prostatic hyperplasia) 06/04/2019  Bilateral carotid bruits 06/04/2019   Meniere disease, 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   Emphysema of lung ([place]) 06/04/2019   Spondylolisthesis at L4-L5 level 08/27/2017   Spinal stenosis in cervical region 08/27/2017   Tremor 06/02/2015   Cervical spondylosis without myelopathy 06/02/2015   Alcoholism in remission ([place]) 06/09/2014   Coronary arteriosclerosis 07/30/2009   History of colonoscopy 07/30/2009   Neutropenia ([place]) 07/30/2008   HIP PAIN 11/27/2007   Essential hypertension 07/25/2007    Atrial fibrillation ([place]) 07/25/2007   GERD 07/25/2007   ERECTILE DYSFUNCTION 07/25/2007   LOW BACK PAIN 07/25/2007    ONSET DATE: 02/06/22-referral date  REFERRING DIAG: Parkinson's disease; Z74.09,Z78.9 (ICD-10-CM) - Impaired mobility and ADLs   THERAPY DIAG:  Other symptoms and signs involving the nervous system  Other symptoms and signs involving the musculoskeletal system  Other lack of coordination  Attention and concentration deficit  Frontal lobe and executive function deficit  Tremor  Unsteadiness on feet  Abnormal posture  SUBJECTIVE:   SUBJECTIVE STATEMENT: "Nothing new"  Pt accompanied by: self  PERTINENT HISTORY:   Parkinson's disease--dx in March 2022 per pt report.    Other PMH:  neck and back pain, arthritis, hx of ETOH abuse, CAD, COPD, emphysema, GERD, HTN, high cholesterol, PVD, chronic vertigo per pt   PRECAUTIONS: Fall  WEIGHT BEARING RESTRICTIONS No  PAIN:  Are you having pain? Yes: NPRS scale: 2-3/10 Pain location: back, legs Pain description: sore Aggravating factors: unknown Relieving factors: unknown  FALLS: Has patient fallen in last 6 months? multiple "lost count"   pt reports that he falls daily, but can usually catch himself--bumps and bruises  LIVING ENVIRONMENT: Lives with: lives alone.   Lives in: House/apartment Stairs: Yes: Internal: ) steps; none and External: 3 steps; can reach both Has following equipment at home: Single point cane  PLOF: Independent  PATIENT GOALS move better, stay independent   HAND DOMINANCE: Right  OBJECTIVE:   TODAY'S TREATMENT:  Arm bike x29mn level 1 for reciprocal movement with cues/target of at least 40rpms for intensity while maintaining movement amplitude/reciprocal movement.   Pt maintained approx 46rpms (forward/backwards)  Simulated ADLs with bag with focus/min cues for large amplitude movements:  Donning/doffing pull-over shirt, donning/doffing pants, pulling bag into hand for  clothing adjustment/donning socks.  Sliding cards across table using PWR hands with mind difficulty for each hand (and min cueing), incr difficulty RUE>LUE  Standing, functional step (diagonal) and reach with each UE/LE with min cueing for large amplitude movements (to flip cards)  Practiced writing (8 sentences) with min-mod cueing for larger size, starting on L side of page (tends to drift to the middle) and slowing down, and stretching hand.  Then continuous "l" with min cueing for maintaining size and spacing.    HOME EXERCISE PROGRAM: 03/19/22:  PWR! Hands (basic 4) and coordination HEP with focus on large amplitude movements   03/26/22- PWR! moves supine basic 4 04/04/22- PWR moves seated, handout issued 04/05/22 04/05/22:  ADL strategies for flipping cards with big movements, and fastening buttons and opening containers big movements . Issued PWR! Moves basic 4 seated handout- did not perform today  04/09/22:  Reviewed Appropriate PD Community Resources (issued by PT); Ways to decr risk of future complications; Timing of medications and meals (info from [facility]; large amplitude movement strategies for donning/doffing jacket    ASSESSMENT: GOALS: Potential Goals reviewed with patient? Yes  SHORT TERM GOALS: Target date: 04/06/22- extended due to scheduling  Pt will be independent with PD-specific HEP.  Goal status: Met  2.  Pt will verbalize understanding of ways to decr risk of future complications and appropriate community resources. Goal status: met  3.  Baseline:Pt will report incr ease with grooming tasks and ability to use only RUE.  Goal status:met, per pt report  4.  Pt will demonstrate improved ease with fastening buttons as evidenced by decreasing 3 button/ unbutton time to:33 secs or less    Baseline: 35 Goal status: met 27.91  5 Pt will demonstrate improved LUE functional use as evidenced by increasing box/ blocks score by 3 blocks.  Baseline: R 53, L  49 Goal status: ongoing,  RUE 49 , LUE 50  LONG TERM GOALS: Target date: 05/17/2022  Pt will verbalize understanding of adaptive strategies/AE to incr ease and safety with ADLs/IADLs.(Cutting food, opening containers) Goal status:met cutting food , opening containers  2.  Pt will demonstrate increased ease with dressing as evidenced by decreasing PPT#4(don/ doff jacket) to 13 secs or less Baseline: 15.88sec Goal status: MET  11.56  3.  Pt will be able to write at least 3 sentences with good legibility and only  no significant decr. In letter size Goal status: ongoing  4.  Pt will improve balance/functional reach as shown by reaching at least 10" with each UE on standing functional reach test.  Baseline: R-8", L-9" Goal status: ongoing  CLINICAL IMPRESSION: Pt is progressing towards goals with improving coordination and larger movement amplitude.    PERFORMANCE DEFICITS in functional skills including ADLs, IADLs, coordination, dexterity, tone, ROM, FMC, GMC, mobility, balance, decreased knowledge of precautions, decreased knowledge of use of DME, and UE functional use, cognitive skills including attention and memory, and psychosocial skills including habits and routines and behaviors.   IMPAIRMENTS are limiting patient from ADLs and IADLs.   COMORBIDITIES has co-morbidities such as CAD, COPD, HTN, hx of neck and back pain  that affects occupational performance. Patient will benefit from skilled OT to address above impairments and improve overall function.  MODIFICATION OR ASSISTANCE TO COMPLETE EVALUATION: Min-Moderate modification of tasks or assist with assess necessary to complete an evaluation.  OT OCCUPATIONAL PROFILE AND HISTORY: Detailed assessment: Review of records and additional review of physical, cognitive, psychosocial history related to current functional performance.  CLINICAL DECISION MAKING: Moderate - several treatment options, min-mod task modification  necessary  REHAB POTENTIAL: Good  EVALUATION COMPLEXITY: Moderate   PLAN: OT FREQUENCY: 2x/week  OT DURATION: 12 weeks, may modify due to scheduling/progress  PLANNED INTERVENTIONS: self care/ADL training, therapeutic exercise, therapeutic activity, neuromuscular re-education, manual therapy, passive range of motion, balance training, functional mobility training, aquatic therapy, ultrasound, paraffin, fluidotherapy, moist heat, cryotherapy, patient/family education, cognitive remediation/compensation, visual/perceptual remediation/compensation, energy conservation, coping strategies training, and DME and/or AE instructions  RECOMMENDED OTHER SERVICES: scheduled for evals with PT and ST  CONSULTED AND AGREED WITH PLAN OF CARE: Patient  PLAN FOR NEXT SESSION:  Pt is scheduled through first week of October, work towards unmet goals, functional reaching with large amplitude movements.   [facility], OTR/L 04/23/2022, 12:10 PM  [facility] [place], [place]  [address] [phone] phone [phone] 04/23/22 12:10 PM

## 2022-04-23 ENCOUNTER — Encounter: Payer: Self-pay | Admitting: Occupational Therapy

## 2022-04-23 ENCOUNTER — Ambulatory Visit: Payer: No Typology Code available for payment source | Admitting: Occupational Therapy

## 2022-04-23 DIAGNOSIS — R251 Tremor, unspecified: Secondary | ICD-10-CM | POA: Diagnosis not present

## 2022-04-23 DIAGNOSIS — R2681 Unsteadiness on feet: Secondary | ICD-10-CM | POA: Diagnosis not present

## 2022-04-23 DIAGNOSIS — M6281 Muscle weakness (generalized): Secondary | ICD-10-CM | POA: Diagnosis not present

## 2022-04-23 DIAGNOSIS — R293 Abnormal posture: Secondary | ICD-10-CM | POA: Diagnosis not present

## 2022-04-23 DIAGNOSIS — R29818 Other symptoms and signs involving the nervous system: Secondary | ICD-10-CM | POA: Diagnosis not present

## 2022-04-23 DIAGNOSIS — R41844 Frontal lobe and executive function deficit: Secondary | ICD-10-CM

## 2022-04-23 DIAGNOSIS — R29898 Other symptoms and signs involving the musculoskeletal system: Secondary | ICD-10-CM

## 2022-04-23 DIAGNOSIS — R278 Other lack of coordination: Secondary | ICD-10-CM

## 2022-04-23 DIAGNOSIS — R471 Dysarthria and anarthria: Secondary | ICD-10-CM | POA: Diagnosis not present

## 2022-04-23 DIAGNOSIS — R2689 Other abnormalities of gait and mobility: Secondary | ICD-10-CM | POA: Diagnosis not present

## 2022-04-23 DIAGNOSIS — R41840 Attention and concentration deficit: Secondary | ICD-10-CM

## 2022-04-25 ENCOUNTER — Ambulatory Visit: Payer: No Typology Code available for payment source | Admitting: Occupational Therapy

## 2022-04-25 DIAGNOSIS — R41840 Attention and concentration deficit: Secondary | ICD-10-CM

## 2022-04-25 DIAGNOSIS — R471 Dysarthria and anarthria: Secondary | ICD-10-CM | POA: Diagnosis not present

## 2022-04-25 DIAGNOSIS — R41844 Frontal lobe and executive function deficit: Secondary | ICD-10-CM

## 2022-04-25 DIAGNOSIS — R293 Abnormal posture: Secondary | ICD-10-CM

## 2022-04-25 DIAGNOSIS — R2689 Other abnormalities of gait and mobility: Secondary | ICD-10-CM | POA: Diagnosis not present

## 2022-04-25 DIAGNOSIS — R29818 Other symptoms and signs involving the nervous system: Secondary | ICD-10-CM | POA: Diagnosis not present

## 2022-04-25 DIAGNOSIS — R251 Tremor, unspecified: Secondary | ICD-10-CM | POA: Diagnosis not present

## 2022-04-25 DIAGNOSIS — M6281 Muscle weakness (generalized): Secondary | ICD-10-CM | POA: Diagnosis not present

## 2022-04-25 DIAGNOSIS — R278 Other lack of coordination: Secondary | ICD-10-CM

## 2022-04-25 DIAGNOSIS — R29898 Other symptoms and signs involving the musculoskeletal system: Secondary | ICD-10-CM

## 2022-04-25 DIAGNOSIS — R2681 Unsteadiness on feet: Secondary | ICD-10-CM

## 2022-04-25 NOTE — Therapy (Signed)
OUTPATIENT OCCUPATIONAL THERAPY [facility]  Patient Name: Lee [name] MRN: [id number] DOB:Dec 19, 1945, 76 y.o., male Today's Date: 04/25/2022  PCP: Dr. Lew Dawes REFERRING PROVIDER: Dr. Landry Mellow   OT End of Session - 04/25/22 0938     Visit Number 12    Number of Visits 25    Date for OT Re-Evaluation 05/17/22    Authorization Type VA    Authorization Time Period through 06/01/22- most likely approved for 15 visits still need to check    Authorization - Visit Number 12    Authorization - Number of Visits 15    Activity Tolerance Patient tolerated treatment well    Behavior During Therapy [facility] for tasks assessed/performed                      Past Medical History:  Diagnosis Date   Acid reflux    Alcohol abuse    quit 10 y ago   Arthritis    BPH (benign prostatic hyperplasia)    CAD (coronary artery disease)    Cervical spondylosis without myelopathy 06/02/2015   COPD (chronic obstructive pulmonary disease) ([place])    Emphysema lung ([place])    GERD (gastroesophageal reflux disease)    High cholesterol    Hypertension    Parkinson's disease ([place])    PVD (peripheral vascular disease) ([place])    Tremor 06/02/2015   Past Surgical History:  Procedure Laterality Date   TONSILLECTOMY     VASECTOMY     Patient Active Problem List   Diagnosis Date Noted   Leukopenia 02/28/2022   Acquired deformities of toe(s), unspecified, unspecified foot 11/27/2021   Asthma 11/27/2021   Cervicalgia 11/27/2021   Corns and callosities 11/27/2021   Disturbance of skin sensation 11/27/2021   Elevated prostate specific antigen (PSA) 11/27/2021   Excessive attrition of teeth 11/27/2021   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   Family history of cancer 11/27/2021   Family history of malignant neoplasm of prostate 11/27/2021   Hyperlipidemia 11/27/2021   Iron deficiency anemia, unspecified 11/27/2021   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[place])  11/27/2021   Myalgia 11/27/2021   Nail dystrophy 11/27/2021   Benign neoplasm of other parts of mouth 11/27/2021   Open angle with borderline findings, high risk, left eye 11/27/2021   Other abnormal glucose 11/27/2021   Other reduced mobility 11/27/2021   Other secondary cataract, bilateral 11/27/2021   Pain in right thigh 11/27/2021   Pain in right knee 11/27/2021   Pain in unspecified shoulder 11/27/2021   Persons encountering health services in other specified circumstances 11/27/2021   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/27/2021   Right lower quadrant pain 11/27/2021   Tinnitus 11/27/2021   Trochanteric bursitis, right hip 11/27/2021   Unilateral primary osteoarthritis, right hip 11/27/2021   Unspecified fall, initial encounter 11/27/2021   Vitamin D deficiency, unspecified 11/27/2021   Weakness 11/27/2021   Constipation 08/28/2021   Chronic obstructive pulmonary disease ([place]) 05/31/2021   Depression 04/13/2021   Parkinson's disease ([place]) 02/28/2021   Weakness of both legs 02/01/2021   Dysphagia 02/01/2021   Weakness of right leg 01/06/2021   Lumbar radiculopathy 12/30/2020   Prostate cancer ([place]) 06/09/2020   Well adult exam 06/04/2019   BPH (benign prostatic hyperplasia) 06/04/2019   Bilateral carotid bruits 06/04/2019   Meniere disease, 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   Emphysema of lung ([place]) 06/04/2019   Spondylolisthesis at L4-L5 level 08/27/2017   Spinal stenosis in cervical region 08/27/2017  Tremor 06/02/2015   Cervical spondylosis without myelopathy 06/02/2015   Alcoholism in remission ([place]) 06/09/2014   Coronary arteriosclerosis 07/30/2009   History of colonoscopy 07/30/2009   Neutropenia ([place]) 07/30/2008   HIP PAIN 11/27/2007   Essential hypertension 07/25/2007   Atrial fibrillation ([place]) 07/25/2007   GERD 07/25/2007   ERECTILE DYSFUNCTION 07/25/2007   LOW BACK PAIN 07/25/2007     ONSET DATE: 02/06/22-referral date  REFERRING DIAG: Parkinson's disease; Z74.09,Z78.9 (ICD-10-CM) - Impaired mobility and ADLs   THERAPY DIAG:  Other symptoms and signs involving the nervous system  Other symptoms and signs involving the musculoskeletal system  Other lack of coordination  Attention and concentration deficit  Frontal lobe and executive function deficit  Tremor  Unsteadiness on feet  Abnormal posture  Other abnormalities of gait and mobility  SUBJECTIVE:   SUBJECTIVE STATEMENT: "Nothing new"  Pt accompanied by: self  PERTINENT HISTORY:   Parkinson's disease--dx in March 2022 per pt report.    Other PMH:  neck and back pain, arthritis, hx of ETOH abuse, CAD, COPD, emphysema, GERD, HTN, high cholesterol, PVD, chronic vertigo per pt   PRECAUTIONS: Fall  WEIGHT BEARING RESTRICTIONS No  PAIN:  Are you having pain? Yes: NPRS scale: 2-3/10 Pain location: back, legs Pain description: sore Aggravating factors: unknown Relieving factors: unknown  FALLS: Has patient fallen in last 6 months? multiple "lost count"   pt reports that he falls daily, but can usually catch himself--bumps and bruises  LIVING ENVIRONMENT: Lives with: lives alone.   Lives in: House/apartment Stairs: Yes: Internal: ) steps; none and External: 3 steps; can reach both Has following equipment at home: Single point cane  PLOF: Independent  PATIENT GOALS move better, stay independent   HAND DOMINANCE: Right  OBJECTIVE:   TODAY'S TREATMENT:  Arm bike x72mn level 1 for reciprocal movement with cues/target of at least 40rpms for intensity while maintaining movement amplitude/reciprocal movement.   Pt maintained approx 43rpms   Supine PWR! Up, rock and twist as warm up , min v.c for amplitude  Standing, functional step  and reach with each UE/LE with min cueing for large amplitude movements (to flip cards), min v.c for amplitude  Practiced writing (with min-mod cueing for  larger size, starting on L side of page (tends to drift to the middle) and slowing down, and stretching hand.  Pt with improved performance following practice.    HOME EXERCISE PROGRAM: 03/19/22:  PWR! Hands (basic 4) and coordination HEP with focus on large amplitude movements   03/26/22- PWR! moves supine basic 4 04/04/22- PWR moves seated, handout issued 04/05/22 04/05/22:  ADL strategies for flipping cards with big movements, and fastening buttons and opening containers big movements . Issued PWR! Moves basic 4 seated handout- did not perform today  04/09/22:  Reviewed Appropriate PD Community Resources (issued by PT); Ways to decr risk of future complications; Timing of medications and meals (info from [facility]; large amplitude movement strategies for donning/doffing jacket    ASSESSMENT: GOALS: Potential Goals reviewed with patient? Yes  SHORT TERM GOALS: Target date: 04/06/22- extended due to scheduling  Pt will be independent with PD-specific HEP.  Goal status: Met  2.  Pt will verbalize understanding of ways to decr risk of future complications and appropriate community resources. Goal status: met  3.  Baseline:Pt will report incr ease with grooming tasks and ability to use only RUE.  Goal status:met, per pt report  4.  Pt will demonstrate improved ease with fastening buttons as evidenced  by decreasing 3 button/ unbutton time to:33 secs or less    Baseline: 35 Goal status: met 27.91  5 Pt will demonstrate improved LUE functional use as evidenced by increasing box/ blocks score by 3 blocks.  Baseline: R 53, L 49 Goal status: ongoing,  RUE 49 , LUE 50  LONG TERM GOALS: Target date: 05/17/2022  Pt will verbalize understanding of adaptive strategies/AE to incr ease and safety with ADLs/IADLs.(Cutting food, opening containers) Goal status:met cutting food , opening containers  2.  Pt will demonstrate increased ease with dressing as evidenced by decreasing PPT#4(don/  doff jacket) to 13 secs or less Baseline: 15.88sec Goal status: MET  11.56  3.  Pt will be able to write at least 3 sentences with good legibility and only  no significant decr. In letter size Goal status: ongoing, 9/27-pt still needs v.c for timing and letter size, he does better with printing  4.  Pt will improve balance/functional reach as shown by reaching at least 10" with each UE on standing functional reach test.  Baseline: R-8", L-9" Goal status: ongoing  CLINICAL IMPRESSION: Pt is progressing towards goals with  performance of larger amplitude with functional activity.  PERFORMANCE DEFICITS in functional skills including ADLs, IADLs, coordination, dexterity, tone, ROM, FMC, GMC, mobility, balance, decreased knowledge of precautions, decreased knowledge of use of DME, and UE functional use, cognitive skills including attention and memory, and psychosocial skills including habits and routines and behaviors.   IMPAIRMENTS are limiting patient from ADLs and IADLs.   COMORBIDITIES has co-morbidities such as CAD, COPD, HTN, hx of neck and back pain  that affects occupational performance. Patient will benefit from skilled OT to address above impairments and improve overall function.  MODIFICATION OR ASSISTANCE TO COMPLETE EVALUATION: Min-Moderate modification of tasks or assist with assess necessary to complete an evaluation.  OT OCCUPATIONAL PROFILE AND HISTORY: Detailed assessment: Review of records and additional review of physical, cognitive, psychosocial history related to current functional performance.  CLINICAL DECISION MAKING: Moderate - several treatment options, min-mod task modification necessary  REHAB POTENTIAL: Good  EVALUATION COMPLEXITY: Moderate   PLAN: OT FREQUENCY: 2x/week  OT DURATION: 12 weeks, may modify due to scheduling/progress  PLANNED INTERVENTIONS: self care/ADL training, therapeutic exercise, therapeutic activity, neuromuscular re-education, manual  therapy, passive range of motion, balance training, functional mobility training, aquatic therapy, ultrasound, paraffin, fluidotherapy, moist heat, cryotherapy, patient/family education, cognitive remediation/compensation, visual/perceptual remediation/compensation, energy conservation, coping strategies training, and DME and/or AE instructions  RECOMMENDED OTHER SERVICES: scheduled for evals with PT and ST  CONSULTED AND AGREED WITH PLAN OF CARE: Patient  PLAN FOR NEXT SESSION:  Pt is scheduled through first week of October, start checking remaining goals  , d/c next week ,functional reaching with large amplitude movements.   Fahima Cifelli, OTR/L 04/25/2022, 9:39 AM  [facility] [name], [place]  [address] [phone] phone [phone] 04/25/22 9:39 AM

## 2022-04-29 NOTE — Therapy (Signed)
OUTPATIENT OCCUPATIONAL THERAPY PARKINSON'S TREATMENT  Patient Name: Lee [name] MRN: [id number] DOB:March 26, 1946, 76 y.o., male Today's Date: 04/30/2022  PCP: Dr. Lew Dawes REFERRING PROVIDER: Dr. Landry Mellow   OT End of Session - 04/30/22 0939     Visit Number 13    Number of Visits 25    Date for OT Re-Evaluation 05/17/22    Authorization Type [place]    Authorization Time Period through 06/01/22- most likely approved for 15 visits still need to check    Authorization - Visit Number 26    Authorization - Number of Visits 15    Progress Note Due on Visit 22    OT Start Time 0938    OT Stop Time 1016    OT Time Calculation (min) 38 min    Activity Tolerance Patient tolerated treatment well    Behavior During Therapy [facility] for tasks assessed/performed                       Past Medical History:  Diagnosis Date   Acid reflux    Alcohol abuse    quit 10 y ago   Arthritis    BPH (benign prostatic hyperplasia)    CAD (coronary artery disease)    Cervical spondylosis without myelopathy 06/02/2015   COPD (chronic obstructive pulmonary disease) ([place])    Emphysema lung ([place])    GERD (gastroesophageal reflux disease)    High cholesterol    Hypertension    Parkinson's disease    PVD (peripheral vascular disease) ([place])    Tremor 06/02/2015   Past Surgical History:  Procedure Laterality Date   TONSILLECTOMY     VASECTOMY     Patient Active Problem List   Diagnosis Date Noted   Leukopenia 02/28/2022   Acquired deformities of toe(s), unspecified, unspecified foot 11/27/2021   Asthma 11/27/2021   Cervicalgia 11/27/2021   Corns and callosities 11/27/2021   Disturbance of skin sensation 11/27/2021   Elevated prostate specific antigen (PSA) 11/27/2021   Excessive attrition of teeth 11/27/2021   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   Family history of cancer 11/27/2021   Family history of malignant neoplasm of prostate 11/27/2021   Hyperlipidemia  11/27/2021   Iron deficiency anemia, unspecified 11/27/2021   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[place]) 11/27/2021   Myalgia 11/27/2021   Nail dystrophy 11/27/2021   Benign neoplasm of other parts of mouth 11/27/2021   Open angle with borderline findings, high risk, left eye 11/27/2021   Other abnormal glucose 11/27/2021   Other reduced mobility 11/27/2021   Other secondary cataract, bilateral 11/27/2021   Pain in right thigh 11/27/2021   Pain in right knee 11/27/2021   Pain in unspecified shoulder 11/27/2021   Persons encountering health services in other specified circumstances 11/27/2021   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/27/2021   Right lower quadrant pain 11/27/2021   Tinnitus 11/27/2021   Trochanteric bursitis, right hip 11/27/2021   Unilateral primary osteoarthritis, right hip 11/27/2021   Unspecified fall, initial encounter 11/27/2021   Vitamin D deficiency, unspecified 11/27/2021   Weakness 11/27/2021   Constipation 08/28/2021   Chronic obstructive pulmonary disease ([place]) 05/31/2021   Depression 04/13/2021   Parkinson's disease 02/28/2021   Weakness of both legs 02/01/2021   Dysphagia 02/01/2021   Weakness of right leg 01/06/2021   Lumbar radiculopathy 12/30/2020   Prostate cancer ([place]) 06/09/2020   Well adult exam 06/04/2019   BPH (benign prostatic hyperplasia) 06/04/2019   Bilateral  carotid bruits 06/04/2019   Meniere disease, 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   Emphysema of lung ([place]) 06/04/2019   Spondylolisthesis at L4-L5 level 08/27/2017   Spinal stenosis in cervical region 08/27/2017   Tremor 06/02/2015   Cervical spondylosis without myelopathy 06/02/2015   Alcoholism in remission ([place]) 06/09/2014   Coronary arteriosclerosis 07/30/2009   History of colonoscopy 07/30/2009   Neutropenia ([place]) 07/30/2008   HIP PAIN 11/27/2007   Essential hypertension 07/25/2007   Atrial fibrillation ([place])  07/25/2007   GERD 07/25/2007   ERECTILE DYSFUNCTION 07/25/2007   LOW BACK PAIN 07/25/2007    ONSET DATE: 02/06/22-referral date  REFERRING DIAG: Parkinson's disease; Z74.09,Z78.9 (ICD-10-CM) - Impaired mobility and ADLs   THERAPY DIAG:  Other symptoms and signs involving the nervous system  Other symptoms and signs involving the musculoskeletal system  Other lack of coordination  Attention and concentration deficit  Frontal lobe and executive function deficit  Tremor  Unsteadiness on feet  Abnormal posture  SUBJECTIVE:   SUBJECTIVE STATEMENT: "Nothing new"  Pt accompanied by: self  PERTINENT HISTORY:   Parkinson's disease--dx in March 2022 per pt report.    Other PMH:  neck and back pain, arthritis, hx of ETOH abuse, CAD, COPD, emphysema, GERD, HTN, high cholesterol, PVD, chronic vertigo per pt   PRECAUTIONS: Fall  WEIGHT BEARING RESTRICTIONS No  PAIN:  Are you having pain? Yes: NPRS scale: 2-3/10 Pain location: back, legs Pain description: sore Aggravating factors: unknown Relieving factors: unknown  FALLS: Has patient fallen in last 6 months? multiple "lost count"   pt reports that he falls daily, but can usually catch himself--bumps and bruises  LIVING ENVIRONMENT: Lives with: lives alone.   Lives in: House/apartment Stairs: Yes: Internal: ) steps; none and External: 3 steps; can reach both Has following equipment at home: Single point cane  PLOF: Independent  PATIENT GOALS move better, stay independent   HAND DOMINANCE: Right  OBJECTIVE:   TODAY'S TREATMENT:  Pt arrived late.  Sitting, functional reaching to place large pegs in vertical pegboard with set-up for large amplitude reaching at end range.    Assessed remaining goals and discussed progress as well as follow-up recommendations--see below for goal status.    Practiced writing (with min cueing/review for strategies:   for larger size, starting on L side of page, slowing down, and  stretching hand initially.  Pt with improved performance today.  Pt wrote 5 sentences with no significant decr in size and good legibility.      HOME EXERCISE PROGRAM: 03/19/22:  PWR! Hands (basic 4) and coordination HEP with focus on large amplitude movements   03/26/22- PWR! moves supine basic 4 04/04/22- PWR moves seated, handout issued 04/05/22 04/05/22:  ADL strategies for flipping cards with big movements, and fastening buttons and opening containers big movements . Issued PWR! Moves basic 4 seated handout- did not perform today  04/09/22:  Reviewed Appropriate PD Community Resources (issued by PT); Ways to decr risk of future complications; Timing of medications and meals (info from [facility]); large amplitude movement strategies for donning/doffing jacket    ASSESSMENT: GOALS: Potential Goals reviewed with patient? Yes  SHORT TERM GOALS: Target date: 04/06/22- extended due to scheduling  Pt will be independent with PD-specific HEP.  Goal status: Met  2.  Pt will verbalize understanding of ways to decr risk of future complications and appropriate community resources. Goal status: met  3.  Baseline:Pt will report incr ease with grooming tasks and ability to use only RUE.  Goal status:met, per  pt report  4.  Pt will demonstrate improved ease with fastening buttons as evidenced by decreasing 3 button/ unbutton time to:33 secs or less    Baseline: 35 Goal status: met 27.91  5 Pt will demonstrate improved LUE functional use as evidenced by increasing box/ blocks score by 3 blocks.  Baseline: R 53, L 49 Goal status: ongoing,  RUE 49 , LUE 50.  04/30/22:  R-53 blocks, L- 52 blocks  LONG TERM GOALS: Target date: 05/17/2022  Pt will verbalize understanding of adaptive strategies/AE to incr ease and safety with ADLs/IADLs.(Cutting food, opening containers) Goal status:met cutting food , opening containers  2.  Pt will demonstrate increased ease with dressing as evidenced by  decreasing PPT#4(don/ doff jacket) to 13 secs or less Baseline: 15.88sec Goal status: MET  11.56  3.  Pt will be able to write at least 3 sentences with good legibility and only  no significant decr. In letter size Goal status: ongoing, 9/27-pt still needs v.c for timing and letter size, he does better with printing.  04/30/22:  met.  4.  Pt will improve balance/functional reach as shown by reaching at least 10" with each UE on standing functional reach test.  Baseline: R-8", L-9" Goal status: 04/30/22:  R-11", L-11"  CLINICAL IMPRESSION: Pt has made good progress and will continue with HEP/recommendations for home.  PERFORMANCE DEFICITS in functional skills including ADLs, IADLs, coordination, dexterity, tone, ROM, FMC, GMC, mobility, balance, decreased knowledge of precautions, decreased knowledge of use of DME, and UE functional use, cognitive skills including attention and memory, and psychosocial skills including habits and routines and behaviors.   IMPAIRMENTS are limiting patient from ADLs and IADLs.   COMORBIDITIES has co-morbidities such as CAD, COPD, HTN, hx of neck and back pain  that affects occupational performance. Patient will benefit from skilled OT to address above impairments and improve overall function.  MODIFICATION OR ASSISTANCE TO COMPLETE EVALUATION: Min-Moderate modification of tasks or assist with assess necessary to complete an evaluation.  OT OCCUPATIONAL PROFILE AND HISTORY: Detailed assessment: Review of records and additional review of physical, cognitive, psychosocial history related to current functional performance.  CLINICAL DECISION MAKING: Moderate - several treatment options, min-mod task modification necessary  REHAB POTENTIAL: Good  EVALUATION COMPLEXITY: Moderate   PLAN: OT FREQUENCY: 2x/week  OT DURATION: 12 weeks, may modify due to scheduling/progress  PLANNED INTERVENTIONS: self care/ADL training, therapeutic exercise, therapeutic  activity, neuromuscular re-education, manual therapy, passive range of motion, balance training, functional mobility training, aquatic therapy, ultrasound, paraffin, fluidotherapy, moist heat, cryotherapy, patient/family education, cognitive remediation/compensation, visual/perceptual remediation/compensation, energy conservation, coping strategies training, and DME and/or AE instructions  RECOMMENDED OTHER SERVICES: scheduled for evals with PT and ST  CONSULTED AND AGREED WITH PLAN OF CARE: Patient  PLAN FOR NEXT SESSION:  d/c OT, recommend screen in approx 6 months   OCCUPATIONAL THERAPY DISCHARGE SUMMARY  Visits from Start of Care: 13  Current functional level related to goals / functional outcomes: See above   Remaining deficits: Bradykinesia, rigidity, decr coordination, abnormal posture, decr balance/functional mobility, Tremors, cognitive deficits   Education / Equipment: Pt was instructed in the following:  PD-specific HEP, adaptive strategies for ADLs/IADLs, ways to prevent future complications, community resources.  Pt verbalized understanding of all education provided.   Plan: Patient agrees to discharge.  Patient goals were met. Patient is being discharged due to being pleased with the current functional level and reaching maximal rehab potential at this time.  Pt would benefit from occupational therapy  screen in approx 6-7 months to assess for need for further therapy/functional changes due to progressive nature of diagnosis.     [facility], OTR/L 04/30/2022, 9:43 AM  [facility] [address] [place], [place]  [id number] [phone] phone [phone] 04/30/22 9:43 AM

## 2022-04-30 ENCOUNTER — Encounter: Payer: Self-pay | Admitting: Occupational Therapy

## 2022-04-30 ENCOUNTER — Ambulatory Visit: Payer: No Typology Code available for payment source | Attending: Internal Medicine | Admitting: Occupational Therapy

## 2022-04-30 DIAGNOSIS — R41840 Attention and concentration deficit: Secondary | ICD-10-CM | POA: Diagnosis present

## 2022-04-30 DIAGNOSIS — R2681 Unsteadiness on feet: Secondary | ICD-10-CM | POA: Diagnosis present

## 2022-04-30 DIAGNOSIS — R251 Tremor, unspecified: Secondary | ICD-10-CM | POA: Diagnosis present

## 2022-04-30 DIAGNOSIS — R29818 Other symptoms and signs involving the nervous system: Secondary | ICD-10-CM | POA: Insufficient documentation

## 2022-04-30 DIAGNOSIS — R278 Other lack of coordination: Secondary | ICD-10-CM | POA: Insufficient documentation

## 2022-04-30 DIAGNOSIS — R41844 Frontal lobe and executive function deficit: Secondary | ICD-10-CM | POA: Insufficient documentation

## 2022-04-30 DIAGNOSIS — R29898 Other symptoms and signs involving the musculoskeletal system: Secondary | ICD-10-CM | POA: Insufficient documentation

## 2022-04-30 DIAGNOSIS — R293 Abnormal posture: Secondary | ICD-10-CM | POA: Diagnosis present

## 2022-05-02 ENCOUNTER — Ambulatory Visit: Payer: No Typology Code available for payment source | Admitting: Occupational Therapy

## 2022-05-14 ENCOUNTER — Telehealth: Payer: No Typology Code available for payment source

## 2022-05-30 ENCOUNTER — Ambulatory Visit (INDEPENDENT_AMBULATORY_CARE_PROVIDER_SITE_OTHER): Payer: No Typology Code available for payment source | Admitting: Internal Medicine

## 2022-05-30 ENCOUNTER — Encounter: Payer: Self-pay | Admitting: Internal Medicine

## 2022-05-30 VITALS — BP 118/62 | HR 82 | Temp 97.9°F | Ht 70.5 in | Wt 152.8 lb

## 2022-05-30 DIAGNOSIS — J439 Emphysema, unspecified: Secondary | ICD-10-CM

## 2022-05-30 DIAGNOSIS — I10 Essential (primary) hypertension: Secondary | ICD-10-CM

## 2022-05-30 DIAGNOSIS — D508 Other iron deficiency anemias: Secondary | ICD-10-CM | POA: Diagnosis not present

## 2022-05-30 DIAGNOSIS — G20A1 Parkinson's disease without dyskinesia, without mention of fluctuations: Secondary | ICD-10-CM | POA: Diagnosis not present

## 2022-05-30 DIAGNOSIS — R531 Weakness: Secondary | ICD-10-CM | POA: Diagnosis not present

## 2022-05-30 DIAGNOSIS — C61 Malignant neoplasm of prostate: Secondary | ICD-10-CM

## 2022-05-30 NOTE — Progress Notes (Signed)
Subjective:  Patient ID: Lee [name], male    DOB: 23-Nov-1945  Age: 76 y.o. MRN: [id number]  CC: Follow-up (3 month f/u)   HPI Sajjad Honea presents for Parkinson's, emphysema, weakness f/u  Outpatient Medications Prior to Visit  Medication Sig Dispense Refill   albuterol (PROVENTIL HFA;VENTOLIN HFA) 108 (90 Base) MCG/ACT inhaler Inhale 2 puffs into the lungs every 4 (four) hours as needed for wheezing or shortness of breath. 1 Inhaler 0   Alum Hydroxide-Mag Carbonate 160-105 MG CHEW CHEW 1 TABLET BY MOUTH AS DIRECTED BY YOUR PROVIDER FOR STOMACH UPSET/DYSPEPSIA UP TO THREE TIMES DAILY     ascorbic acid (VITAMIN C) 500 MG tablet TAKE ONE TABLET BY MOUTH DAILY TO HELP WITH IRON STORES AND IMMUNE SYSTEM. PLEASE TAKE EVERY DAY AND ESPECIALLY WHEN YOU TAKE FERROUS SULFATE.     aspirin EC 81 MG tablet      BLACK PEPPER-TURMERIC PO Take by mouth daily. Mixes as a powder for back pain (ginger, cinnamon, black pepper, turmeric)     carbidopa-levodopa (SINEMET IR) 25-100 MG tablet Take 2 tablets by mouth 3 (three) times daily.     cetirizine (ZYRTEC) 10 MG tablet      Cholecalciferol 50 MCG (2000 UT) TABS      CINNAMON PO Mixes as a powder for back pain (ginger, cinnamon, black pepper, turmeric)     diclofenac Sodium (VOLTAREN) 1 % GEL      FEROSUL 325 (65 Fe) MG tablet Take by mouth.     fluticasone (FLONASE) 50 MCG/ACT nasal spray INSTILL 1 SPRAY IN EACH NOSTRIL TWICE A DAY FOR DIZZINESS     folic acid (FOLVITE) 1 MG tablet Take 1 mg by mouth daily.     Ginger, Zingiber officinalis, (GINGER PO) Mixes as a powder for back pain (ginger, cinnamon, black pepper, turmeric)     hydrochlorothiazide (HYDRODIURIL) 25 MG tablet      methocarbamol (ROBAXIN) 500 MG tablet TAKE ONE TABLET BY MOUTH AFTER SUPPER -- TAKE AFTER EVENING MEAL WHEN NEEDED.  NO DRIVING FOR 8 HOURS. FOR MUSCLE PAIN/SPASMS     mirtazapine (REMERON) 7.5 MG tablet Take 1 tablet (7.5 mg total) by mouth at bedtime. 30 tablet 6    mometasone-formoterol (DULERA) 200-5 MCG/ACT AERO Inhale 2 puffs into the lungs in the morning and at bedtime. 3 each 2   montelukast (SINGULAIR) 10 MG tablet Take 1 tablet (10 mg total) by mouth at bedtime. 30 tablet 6   Multiple Vitamin (QUINTABS) TABS Take by mouth.     naproxen (NAPROSYN) 500 MG tablet      OVER THE COUNTER MEDICATION NeuroQ daily     oxybutynin (DITROPAN-XL) 10 MG 24 hr tablet Take 10 mg by mouth at bedtime.     pantoprazole (PROTONIX) 40 MG tablet Take by mouth.     sildenafil (VIAGRA) 100 MG tablet      tamsulosin (FLOMAX) 0.4 MG CAPS capsule Take 0.4 mg by mouth.     Tiotropium Bromide Monohydrate 2.5 MCG/ACT AERS Inhale 2 puffs into the lungs daily.     vitamin C (ASCORBIC ACID) 500 MG tablet Take by mouth.     No facility-administered medications prior to visit.    ROS: Review of Systems  Constitutional:  Positive for fatigue. Negative for appetite change and unexpected weight change.  HENT:  Negative for congestion, nosebleeds, sneezing, sore throat and trouble swallowing.   Eyes:  Negative for itching and visual disturbance.  Respiratory:  Negative for cough.   Cardiovascular:  Negative for chest pain, palpitations and leg swelling.  Gastrointestinal:  Negative for abdominal distention, blood in stool, diarrhea and nausea.  Genitourinary:  Negative for frequency and hematuria.  Musculoskeletal:  Positive for arthralgias and gait problem. Negative for back pain, joint swelling and neck pain.  Skin:  Negative for rash.  Neurological:  Positive for tremors and weakness. Negative for dizziness, syncope and speech difficulty.  Hematological:  Does not bruise/bleed easily.  Psychiatric/Behavioral:  Positive for decreased concentration and dysphoric mood. Negative for agitation, behavioral problems, confusion, sleep disturbance and suicidal ideas. The patient is not nervous/anxious.     Objective:  BP 118/62 (BP Location: Left Arm)   Pulse 82   Temp 97.9 F  (36.6 C) (Oral)   Ht 5' 10.5" (1.791 m)   Wt 152 lb 12.8 oz (69.3 kg)   SpO2 97%   BMI 21.61 kg/m   BP Readings from Last 3 Encounters:  05/30/22 118/62  04/11/22 (!) 112/58  04/09/22 117/62    Wt Readings from Last 3 Encounters:  05/30/22 152 lb 12.8 oz (69.3 kg)  03/14/22 148 lb (67.1 kg)  02/28/22 151 lb (68.5 kg)    Physical Exam Constitutional:      General: He is not in acute distress.    Appearance: Normal appearance. He is well-developed.     Comments: NAD  Eyes:     Conjunctiva/sclera: Conjunctivae normal.     Pupils: Pupils are equal, round, and reactive to light.  Neck:     Thyroid: No thyromegaly.     Vascular: No JVD.  Cardiovascular:     Rate and Rhythm: Normal rate and regular rhythm.     Heart sounds: Normal heart sounds. No murmur heard.    No friction rub. No gallop.  Pulmonary:     Effort: Pulmonary effort is normal. No respiratory distress.     Breath sounds: Normal breath sounds. No wheezing or rales.  Chest:     Chest wall: No tenderness.  Abdominal:     General: Bowel sounds are normal. There is no distension.     Palpations: Abdomen is soft. There is no mass.     Tenderness: There is no abdominal tenderness. There is no guarding or rebound.  Musculoskeletal:        General: No tenderness. Normal range of motion.     Cervical back: Normal range of motion.     Right lower leg: No edema.     Left lower leg: No edema.  Lymphadenopathy:     Cervical: No cervical adenopathy.  Skin:    General: Skin is warm and dry.     Findings: No rash.  Neurological:     Mental Status: He is alert and oriented to person, place, and time.     Cranial Nerves: No cranial nerve deficit.     Motor: No abnormal muscle tone.     Coordination: Coordination abnormal.     Gait: Gait abnormal.     Deep Tendon Reflexes: Reflexes are normal and symmetric.  Psychiatric:        Behavior: Behavior normal.        Thought Content: Thought content normal.         Judgment: Judgment normal.   Using a cane Tremor R>L hand   Lab Results  Component Value Date   WBC 3.5 (L) 02/28/2022   HGB 13.8 02/28/2022   HCT 40.7 02/28/2022   PLT 192.0 02/28/2022   GLUCOSE 94 02/28/2022   CHOL 225 (H)  02/01/2021   TRIG 56.0 02/01/2021   HDL 88.10 02/01/2021   LDLCALC 126 (H) 02/01/2021   ALT 15 02/28/2022   AST 18 02/28/2022   NA 137 02/28/2022   K 3.9 02/28/2022   CL 102 02/28/2022   CREATININE 1.01 02/28/2022   BUN 12 02/28/2022   CO2 28 02/28/2022   TSH 1.98 02/28/2022   PSA 5.09 (H) 02/01/2021    DG SWALLOW FUNC SPEECH PATH  Result Date: 03/02/2022 Table formatting from the original result was not included. Images from the original result were not included. Objective Swallowing Evaluation: Type of Study: MBS-Modified Barium Swallow Study  Patient Details Name: Jeanpaul Biehl MRN: [id number] Date of Birth: Jun 07, 1946 Today's Date: 03/02/2022 Time: SLP Start Time (ACUTE ONLY): 1142 -SLP Stop Time (ACUTE ONLY): 1206 SLP Time Calculation (min) (ACUTE ONLY): 24 min Past Medical History: Past Medical History: Diagnosis Date  Acid reflux   Alcohol abuse   quit 10 y ago  Arthritis   BPH (benign prostatic hyperplasia)   CAD (coronary artery disease)   Cervical spondylosis without myelopathy 06/02/2015  COPD (chronic obstructive pulmonary disease) ([facility])   Emphysema lung ([facility])   GERD (gastroesophageal reflux disease)   High cholesterol   Hypertension   Parkinson's disease ([place])   PVD (peripheral vascular disease) ([place])   Tremor 06/02/2015 Past Surgical History: Past Surgical History: Procedure Laterality Date  TONSILLECTOMY    VASECTOMY   HPI: Pt is a 76 yo male presenting for OP MBS due to reports of difficulty swallowing. He describes trouble particularly with pills feeling like they get stuck, although he also describes an instance one morning recently in which he coughed up the juice he had drank the night before. Pt is currently being followed by OP SLP. PMH also  includes: Parkinson's disease, COPD, GERD, CAD, HTN, PVD, former alcohol abuse. Pt self-reports a h/o having his esophagus stretched.  Subjective: pt says he is having trouble with pills  Recommendations for follow up therapy are one component of a multi-disciplinary discharge planning process, led by the attending physician.  Recommendations may be updated based on patient status, additional functional criteria and insurance authorization. Assessment / Plan / Recommendation   03/02/2022   2:00 PM Clinical Impressions Clinical Impression Pt presents with grossly functional oropharyngeal swallowing. He does have intermittent pharyngeal residue primarily with liquids, but it is a very small amount, located at either his valleculae or pyriform sinuses, and clears spontaneously as he continues to swallow. Pt does note that he thinks there are times when he is swallowing better than others. Question if this could be in part related to timing around his medications, as he says that he just recently learned about the specific times he is supposed to take his carbidopa-levidopa. Howver, also question if he could be having some more esophageal symptoms, given his reported hx as well as complaints with solids and possible regurgitation of juice recently. Could consider a more esophageal assessment if these symptoms persist. SLP Visit Diagnosis Dysphagia, unspecified (R13.10) Impact on safety and function Mild aspiration risk     03/02/2022   2:00 PM Treatment Recommendations Treatment Recommendations Defer treatment plan to f/u with SLP      No data to display      03/02/2022   2:00 PM Diet Recommendations SLP Diet Recommendations Regular solids;Thin liquid Liquid Administration via Cup;Straw Medication Administration Whole meds with liquid Compensations Slow rate;Small sips/bites Postural Changes Seated upright at 90 degrees;Remain semi-upright after after feeds/meals (Comment)  03/02/2022   2:00 PM Other Recommendations Oral  Care Recommendations Oral care BID Follow Up Recommendations Outpatient SLP Assistance recommended at discharge PRN Functional Status Assessment Patient has had a recent decline in their functional status and demonstrates the ability to make significant improvements in function in a reasonable and predictable amount of time.    No data to display        03/02/2022   2:00 PM Oral Phase Oral Phase [facility]    03/02/2022   2:00 PM Pharyngeal Phase Pharyngeal Phase [facility]    03/02/2022   2:00 PM Cervical Esophageal Phase  Cervical Esophageal Phase [facility] Osie Bond., M.A. CCC-SLP Acute Rehabilitation Services Office [phone] Secure chat preferred 03/02/2022, 3:06 PM                      Assessment & Plan:   Problem List Items Addressed This Visit     Emphysema of lung ([place])    Stable F/u w/Pulmonary Med      Essential hypertension    NAS diet      Iron deficiency anemia, unspecified    Monitoring CBC, iron      Parkinson's disease - Primary    Pt is taking Sinemet now      Relevant Orders   TSH   Comprehensive metabolic panel   CBC with Differential/Platelet   Prostate cancer ([place])   Relevant Orders   PSA   Weakness    Pt is taking Sinemet now      Relevant Orders   TSH   Comprehensive metabolic panel   CBC with Differential/Platelet      No orders of the defined types were placed in this encounter.     Follow-up: Return in about 3 months (around 08/30/2022) for a follow-up visit.  Walker Kehr, MD

## 2022-05-30 NOTE — Assessment & Plan Note (Signed)
Pt is taking Sinemet now

## 2022-05-30 NOTE — Assessment & Plan Note (Signed)
Stable F/u w/Pulmonary Med

## 2022-05-30 NOTE — Assessment & Plan Note (Signed)
Monitoring CBC, iron

## 2022-05-30 NOTE — Assessment & Plan Note (Signed)
NAS diet 

## 2022-08-30 ENCOUNTER — Ambulatory Visit: Payer: No Typology Code available for payment source | Admitting: Internal Medicine

## 2022-10-25 ENCOUNTER — Other Ambulatory Visit: Payer: Self-pay

## 2022-10-25 ENCOUNTER — Emergency Department (HOSPITAL_COMMUNITY)
Admission: EM | Admit: 2022-10-25 | Discharge: 2022-10-26 | Disposition: A | Discharge status: Home / Self Care | Payer: No Typology Code available for payment source | Attending: Emergency Medicine | Admitting: Emergency Medicine

## 2022-10-25 DIAGNOSIS — R1031 Right lower quadrant pain: Secondary | ICD-10-CM | POA: Insufficient documentation

## 2022-10-25 DIAGNOSIS — I10 Essential (primary) hypertension: Secondary | ICD-10-CM | POA: Diagnosis not present

## 2022-10-25 DIAGNOSIS — Z7982 Long term (current) use of aspirin: Secondary | ICD-10-CM | POA: Insufficient documentation

## 2022-10-25 DIAGNOSIS — Z79899 Other long term (current) drug therapy: Secondary | ICD-10-CM | POA: Diagnosis not present

## 2022-10-25 DIAGNOSIS — D72829 Elevated white blood cell count, unspecified: Secondary | ICD-10-CM | POA: Diagnosis not present

## 2022-10-25 LAB — COMPREHENSIVE METABOLIC PANEL
ALT: 6 U/L (ref 0–44)
AST: 22 U/L (ref 15–41)
Albumin: 4 g/dL (ref 3.5–5.0)
Alkaline Phosphatase: 51 U/L (ref 38–126)
Anion gap: 11 (ref 5–15)
BUN: 13 mg/dL (ref 8–23)
CO2: 25 mmol/L (ref 22–32)
Calcium: 9.2 mg/dL (ref 8.9–10.3)
Chloride: 99 mmol/L (ref 98–111)
Creatinine, Ser: 0.94 mg/dL (ref 0.61–1.24)
GFR, Estimated: >60 mL/min (ref >60)
Glucose, Bld: 95 mg/dL (ref 70–99)
Potassium: 3.4 mmol/L — ABNORMAL LOW (ref 3.5–5.1)
Sodium: 135 mmol/L (ref 135–145)
Total Bilirubin: 0.8 mg/dL (ref 0.3–1.2)
Total Protein: 6.9 g/dL (ref 6.5–8.1)

## 2022-10-25 LAB — CBC
HCT: 38.6 % — ABNORMAL LOW (ref 39.0–52.0)
Hemoglobin: 12.8 g/dL — ABNORMAL LOW (ref 13.0–17.0)
MCH: 32.9 pg (ref 26.0–34.0)
MCHC: 33.2 g/dL (ref 30.0–36.0)
MCV: 99.2 fL (ref 80.0–100.0)
Platelets: 193 10*3/uL (ref 150–400)
RBC: 3.89 MIL/uL — ABNORMAL LOW (ref 4.22–5.81)
RDW: 12.6 % (ref 11.5–15.5)
WBC: 3.8 10*3/uL — ABNORMAL LOW (ref 4.0–10.5)
nRBC: 0 % (ref 0.0–0.2)

## 2022-10-25 LAB — LIPASE, BLOOD: Lipase: 32 U/L (ref 11–51)

## 2022-10-25 NOTE — ED Triage Notes (Signed)
Patient reports RLQ abdominal pain for several days worse today , denies emesis or diarrhea , no fever or chills .

## 2022-10-26 ENCOUNTER — Emergency Department (HOSPITAL_COMMUNITY): Payer: No Typology Code available for payment source

## 2022-10-26 LAB — URINALYSIS, ROUTINE W REFLEX MICROSCOPIC
Bilirubin Urine: NEGATIVE
Glucose, UA: NEGATIVE mg/dL
Hgb urine dipstick: NEGATIVE
Ketones, ur: 5 mg/dL — AB
Leukocytes,Ua: NEGATIVE
Nitrite: NEGATIVE
Protein, ur: NEGATIVE mg/dL
Specific Gravity, Urine: 1.019 (ref 1.005–1.030)
pH: 5 (ref 5.0–8.0)

## 2022-10-26 MED ORDER — IOHEXOL 350 MG/ML SOLN
75.0000 mL | Freq: Once | INTRAVENOUS | Status: AC | PRN
  Administered 2022-10-26: 75 mL via INTRAVENOUS

## 2022-10-26 MED ORDER — FENTANYL CITRATE PF 50 MCG/ML IJ SOSY
50.0000 ug | PREFILLED_SYRINGE | Freq: Once | INTRAMUSCULAR | Status: AC
  Administered 2022-10-26: 50 ug via INTRAVENOUS
  Filled 2022-10-26: qty 1

## 2022-10-26 NOTE — Discharge Instructions (Addendum)
Your CT scan today did not show any acute findings.  Can take tylenol or motrin as needed for pain. Follow-up with your primary care doctor. Return here for new concerns.

## 2022-10-26 NOTE — ED Provider Notes (Signed)
[facility] Provider Note   CSN: [id number] Arrival date & time: 10/25/22  2118     History  Chief Complaint  Patient presents with   Abdominal Pain    Lee [name] is a 77 y.o. male.  The history is provided by the patient and medical records.  Abdominal Pain  77 y.o. M with hx of AFIB, depression, HTN, presenting to the ED with RLQ abdominal pain.  Patient reports he has had this pain on and off for a while, more persistent tonight.  He has not had any nausea, vomiting.  BM normal earlier yesterday.  No fevers.  No trouble urinating.  Prior vasectomy, no abdominal surgeries.  Home Medications Prior to Admission medications   Medication Sig Start Date End Date Taking? Authorizing Provider  albuterol (PROVENTIL HFA;VENTOLIN HFA) 108 (90 Base) MCG/ACT inhaler Inhale 2 puffs into the lungs every 4 (four) hours as needed for wheezing or shortness of breath. [id number]   Delora Fuel, MD  Alum Hydroxide-Mag Carbonate 160-105 MG CHEW CHEW 1 TABLET BY MOUTH AS DIRECTED BY YOUR PROVIDER FOR STOMACH UPSET/DYSPEPSIA UP TO THREE TIMES DAILY 10/31/21   [provider]  ascorbic acid (VITAMIN C) 500 MG tablet TAKE ONE TABLET BY MOUTH DAILY TO HELP WITH IRON STORES AND IMMUNE SYSTEM. PLEASE TAKE EVERY DAY AND ESPECIALLY WHEN YOU TAKE FERROUS SULFATE. 04/17/21   [provider]  aspirin EC 81 MG tablet     [provider]  BLACK PEPPER-TURMERIC PO Take by mouth daily. Mixes as a powder for back pain (ginger, cinnamon, black pepper, turmeric)    [provider]  cetirizine (ZYRTEC) 10 MG tablet     [provider]  Cholecalciferol 50 MCG (2000 UT) TABS     [provider]  CINNAMON PO Mixes as a powder for back pain (ginger, cinnamon, black pepper, turmeric) 02/20/18   [provider]  diclofenac Sodium (VOLTAREN) 1 % GEL     [provider]  FEROSUL 325 (65 Fe) MG tablet Take by mouth.  01/05/21   [provider]  fluticasone (FLONASE) 50 MCG/ACT nasal spray INSTILL 1 SPRAY IN EACH NOSTRIL TWICE A DAY FOR DIZZINESS 05/10/20   [provider]  folic acid (FOLVITE) 1 MG tablet Take 1 mg by mouth daily.    [provider]  Ginger, Zingiber officinalis, (GINGER PO) Mixes as a powder for back pain (ginger, cinnamon, black pepper, turmeric) 02/20/18   [provider]  hydrochlorothiazide (HYDRODIURIL) 25 MG tablet     [provider]  mirtazapine (REMERON) 7.5 MG tablet Take 1 tablet (7.5 mg total) by mouth at bedtime. 07/05/21   Penumalli, Earlean Polka, MD  mometasone-formoterol (DULERA) 200-5 MCG/ACT AERO Inhale 2 puffs into the lungs in the morning and at bedtime. 05/31/21   Margaretha Seeds, MD  montelukast (SINGULAIR) 10 MG tablet Take 1 tablet (10 mg total) by mouth at bedtime. 05/31/21   Margaretha Seeds, MD  Multiple Vitamin Artis Flock) TABS Take by mouth.    [provider]  naproxen (NAPROSYN) 500 MG tablet     [provider]  OVER THE COUNTER MEDICATION NeuroQ daily    [provider]  oxybutynin (DITROPAN-XL) 10 MG 24 hr tablet Take 10 mg by mouth at bedtime.    [provider]  pantoprazole (PROTONIX) 40 MG tablet Take by mouth.    [provider]  sildenafil (VIAGRA) 100 MG tablet     [provider]  tamsulosin (FLOMAX) 0.4 MG CAPS capsule Take 0.4 mg by mouth.    [provider]  Tiotropium Bromide Monohydrate 2.5 MCG/ACT AERS Inhale 2 puffs into the lungs daily.    [provider]  vitamin C (ASCORBIC ACID) 500 MG tablet Take by mouth. 01/17/21   [provider]      Allergies    Acyclovir, Carbidopa-levodopa, and Enalapril maleate    Review of Systems   Review of Systems  Gastrointestinal:  Positive for abdominal pain.  All other systems reviewed and are negative.   Physical Exam Updated Vital Signs BP 134/72   Pulse 66   Temp 97.6 F (36.4  C) (Oral)   Resp 20   SpO2 100%  Physical Exam Vitals and nursing note reviewed.  Constitutional:      Appearance: He is well-developed.  HENT:     Head: Normocephalic and atraumatic.  Eyes:     Conjunctiva/sclera: Conjunctivae normal.     Pupils: Pupils are equal, round, and reactive to light.  Cardiovascular:     Rate and Rhythm: Normal rate and regular rhythm.     Heart sounds: Normal heart sounds.  Pulmonary:     Effort: Pulmonary effort is normal.     Breath sounds: Normal breath sounds.  Abdominal:     General: Bowel sounds are normal.     Palpations: Abdomen is soft.     Tenderness: There is abdominal tenderness in the right lower quadrant.     Comments: Very mildly tender RLQ, no peritoneal signs, no rebound or guarding  Musculoskeletal:        General: Normal range of motion.     Cervical back: Normal range of motion.  Skin:    General: Skin is warm and dry.  Neurological:     Mental Status: He is alert and oriented to person, place, and time.     ED Results / Procedures / Treatments   Labs (all labs ordered are listed, but only abnormal results are displayed) Labs Reviewed  COMPREHENSIVE METABOLIC PANEL - Abnormal; Notable for the following components:      Result Value   Potassium 3.4 (*)    All other components within normal limits  CBC - Abnormal; Notable for the following components:   WBC 3.8 (*)    RBC 3.89 (*)    Hemoglobin 12.8 (*)    HCT 38.6 (*)    All other components within normal limits  URINALYSIS, ROUTINE W REFLEX MICROSCOPIC - Abnormal; Notable for the following components:   Color, Urine AMBER (*)    APPearance HAZY (*)    Ketones, ur 5 (*)    All other components within normal limits  LIPASE, BLOOD    EKG None  Radiology CT ABDOMEN PELVIS W CONTRAST  Result Date: 10/26/2022 CLINICAL DATA:  Right lower quadrant abdominal pain. EXAM: CT ABDOMEN AND PELVIS WITH CONTRAST TECHNIQUE: Multidetector CT imaging of the abdomen and pelvis  was performed using the standard protocol following bolus administration of intravenous contrast. RADIATION DOSE REDUCTION: This exam was performed according to the departmental dose-optimization program which includes automated exposure control, adjustment of the mA and/or kV according to patient size and/or use of iterative reconstruction technique. CONTRAST:  24mL OMNIPAQUE IOHEXOL 350 MG/ML SOLN COMPARISON:  No comparison studies available. FINDINGS: Lower chest: Advanced changes of emphysema noted in the lung bases with architectural distortion/scarring and bronchiectasis bilaterally. Hepatobiliary: Well-defined tiny homogeneous hypodensities in the dome of liver and anterior right liver are  too small to characterize but most likely benign. No followup imaging is recommended. There is no evidence for gallstones, gallbladder wall thickening, or pericholecystic fluid. No intrahepatic or extrahepatic biliary dilation. Pancreas: No focal mass lesion. No dilatation of the main duct. No intraparenchymal cyst. No peripancreatic edema. Spleen: No splenomegaly. No focal mass lesion. Adrenals/Urinary Tract: No adrenal nodule or mass. Kidneys unremarkable. No evidence for hydroureter. The urinary bladder appears normal for the degree of distention. Stomach/Bowel: Stomach is unremarkable. No gastric wall thickening. No evidence of outlet obstruction. Duodenum is normally positioned as is the ligament of Treitz. No small bowel wall thickening. No small bowel dilatation. The terminal ileum is normal. The appendix is normal. No gross colonic mass. No colonic wall thickening. Diverticular changes are noted in the left colon without evidence of diverticulitis. Vascular/Lymphatic: There is moderate atherosclerotic calcification of the abdominal aorta without aneurysm. There is no gastrohepatic or hepatoduodenal ligament lymphadenopathy. No retroperitoneal or mesenteric lymphadenopathy. No pelvic sidewall lymphadenopathy.  Reproductive: The prostate gland and seminal vesicles are unremarkable. Other: There is some trace free fluid in the pelvis Musculoskeletal: No worrisome lytic or sclerotic osseous abnormality. IMPRESSION: 1. No acute findings in the abdomen or pelvis. Specifically, no findings to explain the patient's history of right lower quadrant pain. 2. Left colonic diverticulosis without diverticulitis. 3. Trace free fluid in the pelvis, abnormal in a male patient but of indeterminate etiology. 4. Aortic Atherosclerosis (ICD10-I70.0) and Emphysema (ICD10-J43.9). Electronically Signed   By: Misty Stanley M.D.   On: 10/26/2022 06:31    Procedures Procedures    Medications Ordered in ED Medications  fentaNYL (SUBLIMAZE) injection 50 mcg (50 mcg Intravenous Given 10/26/22 0339)  iohexol (OMNIPAQUE) 350 MG/ML injection 75 mL (75 mLs Intravenous Contrast Given 10/26/22 0559)    ED Course/ Medical Decision Making/ A&P                             Medical Decision Making Amount and/or Complexity of Data Reviewed Labs: ordered. Radiology: ordered and independent interpretation performed. ECG/medicine tests: ordered and independent interpretation performed.  Risk Prescription drug management.   77 year old male presenting to the ED with right lower quadrant abdominal pain.  It seems he has been having this intermittently for quite some time but worse recently.  He has not had any vomiting or diarrhea.  Labs were obtained for leukocytosis or electrolyte derangement.  Normal lipase.  UA without signs of infection.  Will obtain CT for further evaluation.  CT without acute findings.  He is sleeping on repeat assessment, appears comfortable.  Vitals stable.  He will need to follow-up with PCP.  Return here for any new/acute changes.  Final Clinical Impression(s) / ED Diagnoses Final diagnoses:  Right lower quadrant abdominal pain    Rx / DC Orders ED Discharge Orders     None         Larene Pickett,  PA-C 10/26/22 [id number]    Merrily Pew, MD 10/26/22 [phone]

## 2022-12-03 NOTE — Therapy (Signed)
[facility] [address] [place], [place], [address] Phone: [phone]   Fax:  [phone]  Patient Details  Name: Lee [name] MRN: [id number] Date of Birth: [birth year] Referring Provider:  Tresa Garter, MD  Encounter Date: 12/06/2022  Occupational Therapy Parkinson's Disease Screen  Hand dominance:  Rt handed, but uses Lt hand to drink water and drive    Physical Performance Test item #2 (simulated eating):  15 sec  Physical Performance Test item #4 (donning/doffing jacket):  13.97 sec  Fastening/unfastening 3 buttons in:  1 min. 7 sec  Box & Blocks Test:   RUE  44 blocks        LUE  50 blocks  Standing Functional Reach Test:   Not tested but anticipate impairment  Change in ability to perform ADLs/IADLs:  reports difficulty getting underwear, pants, socks and shoes on. Difficulty getting feet in/out of care  Other Comments:  Handwriting 90% legible in print with mild micrographia. Severe unsteadiness on feet - noticed freezing and shuffling gait to start ambulation, then scissoring. Pt had to be escorted to car for safety  Pt would benefit from occupational therapy evaluation due to decreased coordination, functional balance and safety, and decreased ability to perform ADLS (slower PPT #2 and LE dressing).  Anticipate 2x/wk for 6-8 weeks   Sheran Lawless, OT 12/03/2022, 6:04 PM  [facility] [address] [place], [place], [address] Phone: [phone]   Fax:  [phone]

## 2022-12-06 ENCOUNTER — Ambulatory Visit: Payer: No Typology Code available for payment source | Admitting: Occupational Therapy

## 2022-12-06 ENCOUNTER — Ambulatory Visit: Payer: No Typology Code available for payment source | Admitting: Speech Pathology

## 2022-12-06 ENCOUNTER — Encounter: Payer: Self-pay | Admitting: Physical Therapy

## 2022-12-06 ENCOUNTER — Ambulatory Visit: Payer: No Typology Code available for payment source | Attending: Diagnostic Neuroimaging | Admitting: Physical Therapy

## 2022-12-06 DIAGNOSIS — R2681 Unsteadiness on feet: Secondary | ICD-10-CM

## 2022-12-06 DIAGNOSIS — R29818 Other symptoms and signs involving the nervous system: Secondary | ICD-10-CM

## 2022-12-06 DIAGNOSIS — R471 Dysarthria and anarthria: Secondary | ICD-10-CM

## 2022-12-06 DIAGNOSIS — R278 Other lack of coordination: Secondary | ICD-10-CM

## 2022-12-06 NOTE — Therapy (Signed)
[facility] [address] [place], [place], [address] Phone: [phone]   Fax:  [phone]  Patient Details  Name: Lee [name] MRN: [id number] Date of Birth: 1946/03/02 Referring Provider:  Tresa Garter, MD  Encounter Date: 12/06/2022  Physical Therapy Parkinson's Disease Screen   10 meter walk test:31.16 seconds wit RW   5 time sit to stand test:25.4 seconds wit UE support (previously 13.66 seconds)  Feels like things are not getting better. Has been taking his Sinemet daily and reports that is doing nothing for home. Had some falls from getting in  a hurry. Didn't hurt himself, but was sore from the fall. Doing his PWR moves sometimes. Has been having freezing episodes - feels like his right leg does not want to move. Also has some spinning vertigo and feeling lightheaded.   When pt entered into clinic with ST, pt very off balance and ST requesting pt to use a RW   Patient would benefit from Physical Therapy evaluation due to imbalance, freezing episodes, and falls. Will have to request to [facility] for referral. Also asked pt to also reach out to [place] for referral.     Drake Leach, PT, DPT  12/06/2022, 8:25 AM  [facility] [address] [place], [place], [address] Phone: [phone]   Fax:  [phone]

## 2022-12-06 NOTE — Therapy (Signed)
[facility] [address] [place], [place], [address] Phone: [phone]   Fax:  [phone]  Patient Details  Name: Lee [name] MRN: [id number] Date of Birth: 1945/08/26 Referring Provider:  Tresa Garter, MD  Encounter Date: 12/06/2022  Speech Therapy Parkinson's Disease Screen   Decibel Level today: low 60s dB  (WNL=70-72 dB) with sound level meter 30cm away from pt's mouth. Overall intelligibility is reduced, with SLP having to ask for frequent repetition throughout short encounter. Pt's conversational volume has decreased since last treatment course  Pt does not report difficulty with swallowing, which does not warrant further evaluation  Pt reports cognition has declined, specifically regarding processing speed, since last ST course   Pt would benefit from speech-language eval for dysarthria and cognition, SLP made recommendation known to pt. At this time, pt declines services, telling SLP it's "too much on my plate." SLP advised to seek referral when able, as pt would benefit from speech therapy course. Pt verbalizes understanding. Recommend ST screen in another 6-8 months, if pt does not return for therapy before then.    Maia Breslow, CCC-SLP 12/06/2022, 7:57 AM  [place] [facility] [address] [place], [place], [address] Phone: [phone]   Fax:  [phone]

## 2022-12-28 ENCOUNTER — Telehealth: Payer: Self-pay | Admitting: Physical Therapy

## 2022-12-28 NOTE — Telephone Encounter (Signed)
Faxed as provider not in EPIC:   Dr. Beverely Pace, Lockie Pares has been seen for Parkinson's Disease screens.The patient would benefit from a PT and OT evaluation for imbalance, falls, freezing, impaired coordination, decr safety due to PD .   If you agree, please place an order in [facility] in [facility] or fax the order to [phone].  Thank you, Sherlie Ban, PT, DPT 12/28/22 1:32 PM    [facility] [address] [place], [place]  [address] Phone:  [phone] Fax:  [phone]

## 2023-02-05 ENCOUNTER — Telehealth: Payer: Self-pay | Admitting: Internal Medicine

## 2023-02-05 NOTE — Telephone Encounter (Signed)
Contacted Lee [name] to schedule their annual wellness visit. Patient declined to schedule AWV at this time.Transferred care to the [place].  [facility] Care Guide [facility] AWV TEAM Direct Dial: [phone]

## 2023-02-22 ENCOUNTER — Other Ambulatory Visit: Payer: Self-pay | Admitting: Orthopedic Surgery

## 2023-02-22 DIAGNOSIS — M542 Cervicalgia: Secondary | ICD-10-CM

## 2023-03-11 ENCOUNTER — Encounter: Payer: Self-pay | Admitting: Neurology

## 2023-03-19 NOTE — Progress Notes (Unsigned)
Assessment/Plan:   1.  Parkinsons disease, with symptoms back to 2016 and likely started on levodopa in 2022 by [facility]  -Patient has no rigidity but does have bradykinesia and tremor, thus meeting diagnostic criteria.  -DaTscan done in 2022 at [facility] was reported to be abnormal with decreased uptake in the bilateral putamen. -he has levodopa listed on the allergy list and the allergy is listed as "it makes me angry."  This is really not what he described today, but rather stated that levodopa just did not work.  It was removed from the allergy list today.  -schedule levodopa challenge.  He does drink a lot of Ensure, which in theory could certainly affect efficacy of levodopa.  -Discussed potential skin biopsy for alpha-synuclein but decided to hold off on that today.   2.  Right lower extremity weakness  -Suspect that this may be from his severe lumbar spinal stenosis.  I do not have the images of this, and only have the report.  It is unclear from the patient today whether or not [facility] is following this, but I think that they are, and they also have a pending MRI of the cervical spine.  -Schedule EMG.    Subjective:   Lee [name] was seen today in the movement disorders clinic for neurologic consultation at the request of Gean Birchwood, MD. Pt with family (who is also his caregiver) who supplements hx.   The consultation is for the evaluation of Parkinsons disease.  Outside records that were made available to me were reviewed.  He is referred by [facility].  He was following there for right leg weakness and pain.  They noted that patient had stopped levodopa and patient was referred here for further evaluation.  Patient currently under the care of [facility].  Patient initially started to see Dr. Anne Hahn in 2016 for right upper extremity rest tremor of 1 years duration at that time.  It was felt he did not meet criteria for Parkinson's disease  then.  He was lost to follow-up until 2022, as he sought care through the [facility].  It is unclear (since [place] notes are not available) the year that he started on levodopa, but [facility] notes state that the [place] did a DaTscan that was apparently abnormal and put him on levodopa.  I am able to see that he had his DaTscan through [facility] (although cannot see the films) and it is reported to show decreased activity in the bilateral putamen.  This was done in 2022.  Dr. Anne Hahn did note that patient was having some trouble with nausea with levodopa.  At the time (currently) patient was having issues with low back pain and trouble walking (which is the reason he ended up getting to [facility] orth[facility]ics).  EMG was completed by Dr. Marjory Lies in 2022 and reported to be unremarkable.  Patient was last seen by Dr. Penumalli/[facility] in June, 2023.  Patient was not on any medication for Parkinsons disease at the time.  Notes indicate that patient had previously been on levodopa and rasagiline and patient did not think that they were helpful.  Patient was told he did not need neurologic follow-up, and to follow-up with PCP.  While most of the records state that patient has back pain, he is not really describing this, but he does describe groin pain, knee pain and significant proximal right leg weakness.  He had an MRI of his lumbar spine at [facility] fairly  recently, in July, 2024.  I do not have the films, but I do have the report.  It was reported to show moderate to severe spinal canal stenosis at L4-L5 and mild to moderate neuroforaminal stenosis at the same level.  He also has a pending MRI of the cervical spine.  He states that he had an injection that did not help.  It is unclear where the injection was.  He states that when he wakes up in the morning the right proximal leg is swollen.  Pt reports today that he took levodopa until 3 weeks ago.  He cannot state how long he took it.  He states that  he got up to 2 po tid.     Specific Symptoms:  Tremor: Yes.  , started in the R hand but now he notes it in the L hand when its on the steering wheel.  No tremor in the LE Family hx of similar:  No. Voice: weaker; stuttering (has gone to [facility] about a year ago and neurorehab) Sleep: trouble getting in/out bed  Vivid Dreams:  No.  Acting out dreams:  No. Wet Pillows: No. Postural symptoms:  Yes.    Falls?  Yes.  , last fall was about a month ago but estimates he has near falls daily Bradykinesia symptoms: difficulty with initiating movement, shuffling gait, slow movements, and difficulty getting out of a chair Loss of smell:  No. Loss of taste:  Yes.   Urinary Incontinence:  No. Difficulty Swallowing:  Yes.  , has had esophagus stretched in the past and has another EGD upcoming Handwriting, micrographia: Yes.   Trouble with ADL's:  Yes.   (Lives by self and takes 2 hours to get dressed but caregiver assists)  Trouble buttoning clothing: Yes.   Depression:  Yes.   Memory changes:  Yes.  , drives some but not far (drives few blocks from home to store); remembers to take meds; some trouble with remembering to pay bills but "I always end up catching it.") Hallucinations:  No.  visual distortions: Yes.   N/V:  No. Lightheaded:  Yes.    Syncope: No. Diplopia:  No. Dyskinesia:  No. Prior exposure to reglan/antipsychotics: No.  Last MRI of the brain was in 2016.  I personally reviewed the films.  There is a few, rare T2 hyperintensities scattered throughout the cerebral white matter.  PREVIOUS MEDICATIONS: Sinemet (was on 2 tablets 3 times daily and did not find it useful and patient stopped it)  ALLERGIES:   Allergies  Allergen Reactions   Acyclovir Swelling   Carbidopa-Levodopa     It made me angry   Enalapril Maleate Other (See Comments)    Reaction:  Unknown     CURRENT MEDICATIONS:  Current Meds  Medication Sig   albuterol (PROVENTIL HFA;VENTOLIN HFA) 108 (90 Base) MCG/ACT  inhaler Inhale 2 puffs into the lungs every 4 (four) hours as needed for wheezing or shortness of breath.   Alum Hydroxide-Mag Carbonate 160-105 MG CHEW CHEW 1 TABLET BY MOUTH AS DIRECTED BY YOUR PROVIDER FOR STOMACH UPSET/DYSPEPSIA UP TO THREE TIMES DAILY   ascorbic acid (VITAMIN C) 500 MG tablet TAKE ONE TABLET BY MOUTH DAILY TO HELP WITH IRON STORES AND IMMUNE SYSTEM. PLEASE TAKE EVERY DAY AND ESPECIALLY WHEN YOU TAKE FERROUS SULFATE.   BLACK PEPPER-TURMERIC PO Take by mouth daily. Mixes as a powder for back pain (ginger, cinnamon, black pepper, turmeric)   Cholecalciferol 50 MCG (2000 UT) TABS    CINNAMON PO  Mixes as a powder for back pain (ginger, cinnamon, black pepper, turmeric)   diclofenac Sodium (VOLTAREN) 1 % GEL    FEROSUL 325 (65 Fe) MG tablet Take by mouth.   fluticasone (FLONASE) 50 MCG/ACT nasal spray INSTILL 1 SPRAY IN EACH NOSTRIL TWICE A DAY FOR DIZZINESS   folic acid (FOLVITE) 1 MG tablet Take 1 mg by mouth daily.   Ginger, Zingiber officinalis, (GINGER PO) Mixes as a powder for back pain (ginger, cinnamon, black pepper, turmeric)   hydrochlorothiazide (HYDRODIURIL) 25 MG tablet    mirtazapine (REMERON) 7.5 MG tablet Take 1 tablet (7.5 mg total) by mouth at bedtime.   mometasone-formoterol (DULERA) 200-5 MCG/ACT AERO Inhale 2 puffs into the lungs in the morning and at bedtime.   Multiple Vitamin (QUINTABS) TABS Take by mouth.   oxybutynin (DITROPAN-XL) 10 MG 24 hr tablet Take 10 mg by mouth at bedtime.   pantoprazole (PROTONIX) 40 MG tablet Take by mouth.   vitamin C (ASCORBIC ACID) 500 MG tablet Take by mouth.     Objective:   VITALS:   Vitals:   03/21/23 1318  BP: (!) 102/54  Pulse: 78  SpO2: 97%  Weight: 160 lb 6.4 oz (72.8 kg)  Height: 5\' 11"  (1.803 m)    GEN:  The patient appears stated age and is in NAD. HEENT:  Normocephalic, atraumatic.  The mucous membranes are moist. The superficial temporal arteries are without ropiness or tenderness. CV:  RRR Lungs:   CTAB Neck/HEME:  There are no carotid bruits bilaterally.  Neurological examination:  Orientation: The patient is alert and oriented x3.  Cranial nerves: There is good facial symmetry. There is facial hypomimia.  There is upgaze paresis, but no downgaze paresis.  Horizontal extraocular muscles are intact. The visual fields are full to confrontational testing. The speech is fluent and dysarthric.  He stutters.   Soft palate rises symmetrically and there is no tongue deviation. Hearing is intact to conversational tone. Sensation: Sensation is intact to light and pinprick throughout (facial, trunk, extremities). Vibration is intact at the bilateral ankle. There is no extinction with double simultaneous stimulation. There is no sensory dermatomal level identified. Motor: Strength is 5/5 in the bilateral upper and l left ower extremities.  Strength in the hip flexors on the right is 4/5 and in the distal right lower extremity (both knee flexors and extensors) is 5/5.  Shoulder shrug is equal and symmetric.  There is no pronator drift. Deep tendon reflexes: Deep tendon reflexes are 2+/4 at the bilateral biceps, triceps, brachioradialis, 3/4 at the bilateral patella with cross adductor reflexes. Plantar responses are downgoing bilaterally.  Movement examination: Tone: There is normal tone in the bilateral upper extremities.  The tone in the lower extremities is normal.  Abnormal movements: There is intermittent, albeit rare, right upper extremity rest tremor Coordination:  There is  decremation with RAM's, mostly with hand opening and closing and finger taps bilaterally. Gait and Station: The patient pushes off to arise. Pt ambulates with a walker.  He is forward flexed.  He drags the R leg.  He is short stepped.   I have reviewed and interpreted the following labs independently   Chemistry      Component Value Date/Time   NA 135 10/25/2022 2139   K 3.4 (L) 10/25/2022 2139   CL 99 10/25/2022 2139    CO2 25 10/25/2022 2139   BUN 13 10/25/2022 2139   CREATININE 0.94 10/25/2022 2139      Component Value Date/Time  CALCIUM 9.2 10/25/2022 2139   ALKPHOS 51 10/25/2022 2139   AST 22 10/25/2022 2139   ALT 6 10/25/2022 2139   BILITOT 0.8 10/25/2022 2139      Lab Results  Component Value Date   TSH 1.98 02/28/2022   Lab Results  Component Value Date   WBC 3.8 (L) 10/25/2022   HGB 12.8 (L) 10/25/2022   HCT 38.6 (L) 10/25/2022   MCV 99.2 10/25/2022   PLT 193 10/25/2022     Total time spent on today's visit was 85 minutes, including both face-to-face time and nonface-to-face time.  Time included that spent on review of records (prior notes available to me/labs/imaging if pertinent), discussing treatment and goals, answering patient's questions and coordinating care.  Cc:  [name], Georgina Quint, MD

## 2023-03-21 ENCOUNTER — Ambulatory Visit (INDEPENDENT_AMBULATORY_CARE_PROVIDER_SITE_OTHER): Payer: Medicare Other | Admitting: Neurology

## 2023-03-21 ENCOUNTER — Encounter: Payer: Self-pay | Admitting: Neurology

## 2023-03-21 VITALS — BP 102/54 | HR 78 | Ht 71.0 in | Wt 160.4 lb

## 2023-03-21 DIAGNOSIS — R296 Repeated falls: Secondary | ICD-10-CM

## 2023-03-21 DIAGNOSIS — M48061 Spinal stenosis, lumbar region without neurogenic claudication: Secondary | ICD-10-CM | POA: Diagnosis not present

## 2023-03-21 DIAGNOSIS — G20A1 Parkinson's disease without dyskinesia, without mention of fluctuations: Secondary | ICD-10-CM | POA: Diagnosis not present

## 2023-03-21 DIAGNOSIS — M5416 Radiculopathy, lumbar region: Secondary | ICD-10-CM | POA: Diagnosis not present

## 2023-04-02 ENCOUNTER — Encounter: Payer: No Typology Code available for payment source | Admitting: Neurology

## 2023-04-03 ENCOUNTER — Ambulatory Visit (INDEPENDENT_AMBULATORY_CARE_PROVIDER_SITE_OTHER): Payer: No Typology Code available for payment source | Admitting: Neurology

## 2023-04-03 DIAGNOSIS — M5416 Radiculopathy, lumbar region: Secondary | ICD-10-CM | POA: Diagnosis not present

## 2023-04-03 NOTE — Procedures (Signed)
[facility]  [address], [address]  [place], [place] [address] Tel: [phone] Fax: [phone] Test Date:  04/03/2023  Patient: Lee [name] DOB: 17-Oct-1945 Physician: Jacquelyne Balint, MD  Sex: Male Height: 5\' 11"  Ref Phys: Kerin Salen, DO  ID#: [id number]   Technician:    History: This is a 77 year old male with right lower limb weakness and pain.  NCV & EMG Findings: Extensive electrodiagnostic evaluation of the right lower limb shows: Right sural and superficial peroneal/fibular sensory responses are within normal limits. Right peroneal/fibular (EDB) motor response shows reduced amplitude (0.45 mV). Right peroneal/fibular (TA) and tibial (AH) motor responses are within normal limits. Right H reflex is within normal limits. Chronic motor axon loss changes without accompanying active denervation changes are seen in the right rectus femoris, iliacus, and lumbosacral paraspinal (L2 level) muscles.  Impression: This is an abnormal study. The findings are most consistent with the following: The residuals of an old intraspinal canal lesion (ie: motor radiculopathy) at the right L3 root or segment, mild in degree electrically. No electrodiagnostic evidence of a large fiber sensorimotor neuropathy. Isolated low amplitude peroneal/fibular motor response at the EDB is of unclear clinical significance and may be due to chronic repetitive local trauma from footwear or technical in nature.    ___________________________ Jacquelyne Balint, MD    Nerve Conduction Studies Motor Nerve Results    Latency Amplitude F-Lat Segment Distance CV Comment  Site (ms) Norm (mV) Norm (ms)  (cm) (m/s) Norm   Right Fibular (EDB) Motor  Ankle 4.1  < 6.0 *0.45  > 2.5        Bel fib head 12.1 - 0.46 -  Bel fib head-Ankle 34 43  > 40   Pop fossa 14.2 - 0.45 -  Pop fossa-Bel fib head 9 43 -   Right Fibular (TA) Motor  Fib head 1.95  < 4.5 3.4  > 3.0        Pop fossa 3.9  < 6.7 3.4 -  Pop  fossa-Fib head 8 41  > 40   Right Tibial (AH) Motor  Ankle 4.4  < 6.0 8.1  > 4.0        Knee 12.6 - 6.8 -  Knee-Ankle 46 56  > 40    Sensory Sites    Neg Peak Lat Amplitude (O-P) Segment Distance Velocity Comment  Site (ms) Norm (V) Norm  (cm) (ms)   Right Superficial Fibular Sensory  14 cm-Ankle 3.0  < 4.6 3  > 3 14 cm-Ankle 14    Right Sural Sensory  Calf-Lat mall 3.9  < 4.6 3  > 3 Calf-Lat mall 14     H-Reflex Results    M-Lat H Lat H Neg Amp H-M Lat  Site (ms) (ms) Norm (mV) (ms)  Right Tibial H-Reflex  Pop fossa 6.6 33.1  < 35.0 1.02 26.5   Electromyography   Side Muscle Ins.Act Fibs Fasc Recrt Amp Dur Poly Activation Comment  Right Tib ant Nml Nml Nml Nml Nml Nml Nml Nml N/A  Right Gastroc MH Nml Nml Nml Nml Nml Nml Nml Nml N/A  Right FDL Nml Nml Nml Nml Nml Nml Nml Nml N/A  Right Rectus fem Nml Nml Nml *1- *1+ *1+ *1+ Nml N/A  Right Add longus Nml Nml Nml Nml Nml Nml Nml *2- Very poor activation; Limited motor units visualized  Right Iliacus Nml Nml Nml *1- *1+ *1+ *1+ Nml N/A  Right Gluteus med Nml Nml Nml Nml  Nml Nml Nml Nml N/A  Right L2 PSP Nml Nml Nml *1- *1+ *1+ *1+ Nml N/A      Waveforms:  Motor        Sensory      H-Reflex

## 2023-04-05 ENCOUNTER — Other Ambulatory Visit: Payer: Self-pay

## 2023-04-05 DIAGNOSIS — M48061 Spinal stenosis, lumbar region without neurogenic claudication: Secondary | ICD-10-CM

## 2023-04-05 DIAGNOSIS — M5416 Radiculopathy, lumbar region: Secondary | ICD-10-CM

## 2023-04-09 ENCOUNTER — Encounter: Payer: No Typology Code available for payment source | Admitting: Neurology

## 2023-04-10 ENCOUNTER — Encounter: Payer: No Typology Code available for payment source | Admitting: Neurology

## 2023-04-29 ENCOUNTER — Encounter: Payer: No Typology Code available for payment source | Admitting: Neurology

## 2023-05-02 NOTE — Progress Notes (Signed)
Assessment/Plan:   1.  Parkinsons disease, with symptoms back to 2016 and likely started on levodopa in 2022 by [facility]             -Patient has no rigidity but does have bradykinesia and tremor, thus meeting diagnostic criteria.             -DaTscan done in 2022 at [facility] was reported to be abnormal with decreased uptake in the bilateral putamen. -Levodopa challenge done on May 03, 2023, which did show benefit to levodopa.  He had significant difficulty arising out of a chair prior to the addition of levodopa.  Following the addition, he was able to arise without the use of his hands (albeit slowly).  Nonetheless, he was not convinced and did not want to take the medication yet.  He felt the improvement was because he sat in the room and rested for a long time waiting for the levodopa to kick in.  We talked about risks and benefits, and he decided to hold off on taking the medication. -Discussed with patient that the dragging of the right leg and the freezing was from Parkinson's disease, but the right lower extremity pain and swelling around the knee was not from Parkinson's.  2.  Low blood pressure  -Patient experiencing dizziness.  Blood pressures have been trending significantly low, including today.  Blood pressure was taken prior to the addition of levodopa.  After some discussion, he was agreeable to trying low-dose midodrine, 2.5 mg 3 times per day with meals.  Discussed risk, benefits, side effects, including risk of supine hypertension.  He is going to start checking his blood pressure at home in various positions.  3.  right lower extremity weakness             -Suspect that this may be from his severe lumbar spinal stenosis.  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  Patient was told to make a follow-up with [facility], who he was following for this.       Subjective:   Lee [name] was seen today in follow up for Parkinsons disease.  My  previous records were reviewed prior to todays visit as well as outside records available to me. Pt presents today for levodopa challenge test.  Current prescribed movement disorder medications: None   PREVIOUS MEDICATIONS: Sinemet  ALLERGIES:   Allergies  Allergen Reactions   Acyclovir Swelling   Enalapril Maleate Other (See Comments)    Reaction:  Unknown     CURRENT MEDICATIONS:  Current Meds  Medication Sig   albuterol (PROVENTIL HFA;VENTOLIN HFA) 108 (90 Base) MCG/ACT inhaler Inhale 2 puffs into the lungs every 4 (four) hours as needed for wheezing or shortness of breath.   Alum Hydroxide-Mag Carbonate 160-105 MG CHEW CHEW 1 TABLET BY MOUTH AS DIRECTED BY YOUR PROVIDER FOR STOMACH UPSET/DYSPEPSIA UP TO THREE TIMES DAILY   ascorbic acid (VITAMIN C) 500 MG tablet TAKE ONE TABLET BY MOUTH DAILY TO HELP WITH IRON STORES AND IMMUNE SYSTEM. PLEASE TAKE EVERY DAY AND ESPECIALLY WHEN YOU TAKE FERROUS SULFATE.   BLACK PEPPER-TURMERIC PO Take by mouth daily. Mixes as a powder for back pain (ginger, cinnamon, black pepper, turmeric)   Cholecalciferol 50 MCG (2000 UT) TABS    CINNAMON PO Mixes as a powder for back pain (ginger, cinnamon, black pepper, turmeric)   diclofenac Sodium (VOLTAREN) 1 % GEL    FEROSUL 325 (65 Fe) MG tablet Take by mouth.   fluticasone (  FLONASE) 50 MCG/ACT nasal spray INSTILL 1 SPRAY IN EACH NOSTRIL TWICE A DAY FOR DIZZINESS   folic acid (FOLVITE) 1 MG tablet Take 1 mg by mouth daily.   Ginger, Zingiber officinalis, (GINGER PO) Mixes as a powder for back pain (ginger, cinnamon, black pepper, turmeric)   hydrochlorothiazide (HYDRODIURIL) 25 MG tablet    mometasone-formoterol (DULERA) 200-5 MCG/ACT AERO Inhale 2 puffs into the lungs in the morning and at bedtime.   Multiple Vitamin (QUINTABS) TABS Take by mouth.   oxybutynin (DITROPAN-XL) 10 MG 24 hr tablet Take 10 mg by mouth at bedtime.   pantoprazole (PROTONIX) 40 MG tablet Take by mouth.   vitamin C (ASCORBIC  ACID) 500 MG tablet Take by mouth.     Objective:   PHYSICAL EXAMINATION:    VITALS:   Vitals:   05/03/23 1440  BP: 99/64  Pulse: (!) 104  SpO2: 98%  Weight: 158 lb (71.7 kg)  Height: 5\' 11"  (1.803 m)    GEN:  The patient appears stated age and is in NAD. HEENT:  Normocephalic, atraumatic.  The mucous membranes are moist. The superficial temporal arteries are without ropiness or tenderness. CV:  RRR Lungs:  CTAB Neck/HEME:  There are no carotid bruits bilaterally.  Neurological examination:  Orientation: The patient is alert and oriented x3. Cranial nerves: There is good facial symmetry with significant facial hypomimia. The speech is fluent and significantly dysarthric. Soft palate rises symmetrically and there is no tongue deviation. Hearing is intact to conversational tone. Sensation: Sensation is intact to light touch throughout Motor: Strength is at least antigravity x4.  Levodopa challenge done today.  UPDRS motor off score was 34.  Pt then given to 50 mg of levodopa dissolved in ginger ale and waited to re-examine him.  UPDRS motor on score was 24.  Details of UPDRS motor score documented on separate neurophysiologic worksheet.     Movement examination: Tone: There is normal tone in the bilateral upper extremities.  The tone in the lower extremities is normal.  This was before and after the addition of levodopa Abnormal movements: There is an right upper extremity tremor and reemergent tremor prior to the addition of levodopa.  This was markedly improved post levodopa. Coordination:  There is decremation with rapid alternating movements with finger taps and hand opening and closing and toe taps on the right.  This was improved post levodopa. Gait and Station: The patient require was pushing off to arise prior to levodopa.  Post levodopa, he is able to rise without the use of his hands, although he does struggle somewhat with this.  Prior to levodopa, he is  forward flexed and drags the right leg and freezes.  Following levodopa, he ambulates much better and walks a significant amount of way down the hallway all the way to the restroom.  I have reviewed and interpreted the following labs independently    Chemistry      Component Value Date/Time   NA 135 10/25/2022 2139   K 3.4 (L) 10/25/2022 2139   CL 99 10/25/2022 2139   CO2 25 10/25/2022 2139   BUN 13 10/25/2022 2139   CREATININE 0.94 10/25/2022 2139      Component Value Date/Time   CALCIUM 9.2 10/25/2022 2139   ALKPHOS 51 10/25/2022 2139   AST 22 10/25/2022 2139   ALT 6 10/25/2022 2139   BILITOT 0.8 10/25/2022 2139       Lab Results  Component Value Date   WBC 3.8 (L)  10/25/2022   HGB 12.8 (L) 10/25/2022   HCT 38.6 (L) 10/25/2022   MCV 99.2 10/25/2022   PLT 193 10/25/2022    Lab Results  Component Value Date   TSH 1.98 02/28/2022     Total time spent on today's visit was 90 minutes, including both face-to-face time and nonface-to-face time.  Time included that spent on review of records (prior notes available to me/labs/imaging if pertinent), discussing treatment and goals, answering patient's questions and coordinating care.  Cc:  [name], Georgina Quint, MD

## 2023-05-03 ENCOUNTER — Ambulatory Visit: Payer: Medicare Other | Admitting: Neurology

## 2023-05-03 ENCOUNTER — Encounter: Payer: No Typology Code available for payment source | Admitting: Neurology

## 2023-05-03 VITALS — BP 99/64 | HR 104 | Ht 71.0 in | Wt 158.0 lb

## 2023-05-03 DIAGNOSIS — I959 Hypotension, unspecified: Secondary | ICD-10-CM | POA: Diagnosis not present

## 2023-05-03 DIAGNOSIS — G20A1 Parkinson's disease without dyskinesia, without mention of fluctuations: Secondary | ICD-10-CM

## 2023-05-03 DIAGNOSIS — R498 Other voice and resonance disorders: Secondary | ICD-10-CM | POA: Diagnosis not present

## 2023-05-03 DIAGNOSIS — M5416 Radiculopathy, lumbar region: Secondary | ICD-10-CM | POA: Diagnosis not present

## 2023-05-03 MED ORDER — MIDODRINE HCL 2.5 MG PO TABS: 2.5000 mg | ORAL_TABLET | Freq: Three times a day (TID) | ORAL | 1 refills | Status: DC

## 2023-05-03 NOTE — Patient Instructions (Signed)
Start midodrine, 2.5 mg three times per day with meals.  Check your blood pressure in various positions.  The physicians and staff at [facility] are committed to providing excellent care. You may receive a survey requesting feedback about your experience at our office. We strive to receive "very good" responses to the survey questions. If you feel that your experience would prevent you from giving the office a "very good " response, please contact our office to try to remedy the situation. We may be reached at [phone]. Thank you for taking the time out of your busy day to complete the survey.

## 2023-05-17 ENCOUNTER — Encounter: Payer: Self-pay | Admitting: Gastroenterology

## 2023-05-17 ENCOUNTER — Ambulatory Visit (INDEPENDENT_AMBULATORY_CARE_PROVIDER_SITE_OTHER): Payer: No Typology Code available for payment source | Admitting: Gastroenterology

## 2023-05-17 VITALS — BP 112/62 | HR 68 | Ht 70.0 in | Wt 156.0 lb

## 2023-05-17 DIAGNOSIS — R1310 Dysphagia, unspecified: Secondary | ICD-10-CM

## 2023-05-17 DIAGNOSIS — G20A1 Parkinson's disease without dyskinesia, without mention of fluctuations: Secondary | ICD-10-CM | POA: Diagnosis not present

## 2023-05-17 NOTE — Progress Notes (Unsigned)
HPI : Lee [name] is a 77 y.o. male with a history of Parkinson's disease, COPD, coronary artery disease, peripheral artery disease who is referred to [place] by [name], Georgina Quint, MD for further evaluation of dysphagia.  The patient states that his dysphagia has been going on for a couple years now.  He has some difficulty describing his dysphagia, but states that when he swallows, it "will not go down smoothly".  He denies the sensation is sitting in his chest after he swallows.  It seems to be his issue is more with initiating swallow.  He denies having to forcefully regurgitate or vomit up food that is sitting in his chest. He denies symptoms of heartburn or acid regurgitation.  No globus sensation or throat irritation.  He does have occasional chest discomfort, but does not describe this as a burning sensation.  He reports undergoing an upper endoscopy with dilation at the [place] a few years ago, but also states that he remembers being told by the doctor that the dilation probably would not be helpful.  He does report and if there is any improvement following the dilation, it was minimal.  He denies any lower GI symptoms.  Specifically, no problems with abdominal pain, constipation or diarrhea.  No blood in the stool.  His weight is stable.   He reports that his Parkinson's seems to be progressing and he is frustrated by the lack of help provided by his neurologist.    Colonoscopy 2003 (Dr. Jarold Motto) Normal   Past Medical History:  Diagnosis Date   Acid reflux    Alcohol abuse    quit 10 y ago   Arthritis    BPH (benign prostatic hyperplasia)    CAD (coronary artery disease)    Cervical spondylosis without myelopathy 06/02/2015   COPD (chronic obstructive pulmonary disease) (HCC)    Emphysema lung (HCC)    GERD (gastroesophageal reflux disease)    High cholesterol    Hypertension    Parkinson's disease    PVD (peripheral vascular disease) (HCC)    Tremor 06/02/2015      Past Surgical History:  Procedure Laterality Date   TONSILLECTOMY     VASECTOMY     Family History  Problem Relation Age of Onset   Cancer Brother 58       prostate ca   Irregular heart beat Mother    Irregular heart beat Father    Tremor Father    Cancer Sister    Social History   Tobacco Use   Smoking status: Former    Current packs/day: 0.00    Types: Cigarettes    Quit date: 06/09/2005    Years since quitting: 17.9   Smokeless tobacco: Never  Vaping Use   Vaping status: Never Used  Substance Use Topics   Alcohol use: No    Comment: quit 10 years ago   Drug use: No   Current Outpatient Medications  Medication Sig Dispense Refill   albuterol (PROVENTIL HFA;VENTOLIN HFA) 108 (90 Base) MCG/ACT inhaler Inhale 2 puffs into the lungs every 4 (four) hours as needed for wheezing or shortness of breath. 1 Inhaler 0   Alum Hydroxide-Mag Carbonate 160-105 MG CHEW CHEW 1 TABLET BY MOUTH AS DIRECTED BY YOUR PROVIDER FOR STOMACH UPSET/DYSPEPSIA UP TO THREE TIMES DAILY     ascorbic acid (VITAMIN C) 500 MG tablet TAKE ONE TABLET BY MOUTH DAILY TO HELP WITH IRON STORES AND IMMUNE SYSTEM. PLEASE TAKE EVERY DAY AND ESPECIALLY WHEN YOU TAKE FERROUS  SULFATE.     BLACK PEPPER-TURMERIC PO Take by mouth daily. Mixes as a powder for back pain (ginger, cinnamon, black pepper, turmeric)     Cholecalciferol 50 MCG (2000 UT) TABS      CINNAMON PO Mixes as a powder for back pain (ginger, cinnamon, black pepper, turmeric)     diclofenac Sodium (VOLTAREN) 1 % GEL      FEROSUL 325 (65 Fe) MG tablet Take by mouth.     fluticasone (FLONASE) 50 MCG/ACT nasal spray INSTILL 1 SPRAY IN EACH NOSTRIL TWICE A DAY FOR DIZZINESS     folic acid (FOLVITE) 1 MG tablet Take 1 mg by mouth daily.     Ginger, Zingiber officinalis, (GINGER PO) Mixes as a powder for back pain (ginger, cinnamon, black pepper, turmeric)     hydrochlorothiazide (HYDRODIURIL) 25 MG tablet      midodrine (PROAMATINE) 2.5 MG tablet Take  1 tablet (2.5 mg total) by mouth 3 (three) times daily with meals. 270 tablet 1   mirtazapine (REMERON) 7.5 MG tablet Take 1 tablet (7.5 mg total) by mouth at bedtime. (Patient not taking: Reported on 05/03/2023) 30 tablet 6   mometasone-formoterol (DULERA) 200-5 MCG/ACT AERO Inhale 2 puffs into the lungs in the morning and at bedtime. 3 each 2   Multiple Vitamin (QUINTABS) TABS Take by mouth.     oxybutynin (DITROPAN-XL) 10 MG 24 hr tablet Take 10 mg by mouth at bedtime.     pantoprazole (PROTONIX) 40 MG tablet Take by mouth.     vitamin C (ASCORBIC ACID) 500 MG tablet Take by mouth.     No current facility-administered medications for this visit.   Allergies  Allergen Reactions   Acyclovir Swelling   Enalapril Maleate Other (See Comments)    Reaction:  Unknown      Review of Systems: All systems reviewed and negative except where noted in HPI.    No results found.  Physical Exam: BP 112/62   Pulse 68   Ht 5\' 10"  (1.778 m)   Wt 156 lb (70.8 kg)   BMI 22.38 kg/m  Constitutional: Pleasant,well-developed, frail elderly African-American male in no acute distress.  Accompanied by cousin who is his caretaker.  Uses forward wheeled walker for ambulation HEENT: Normocephalic and atraumatic. Conjunctivae are normal. No scleral icterus. Neck supple.  Cardiovascular: Normal rate, regular rhythm.  Pulmonary/chest: Effort normal and breath sounds normal. No wheezing, rales or rhonchi. Abdominal: Soft, nondistended, nontender. Bowel sounds active throughout. There are no masses palpable. No hepatomegaly. Extremities: no edema Lymphadenopathy: No cervical adenopathy noted. Neurological: Alert and oriented to person place and time.  No resting tremor noted.  Patient's speech is difficult to understand at times.  Ambulation very slow and deliberate. Skin: Skin is warm and dry. No rashes noted. Psychiatric: Normal mood and affect. Behavior is normal.  CBC    Component Value Date/Time    WBC 3.8 (L) 10/25/2022 2139   RBC 3.89 (L) 10/25/2022 2139   HGB 12.8 (L) 10/25/2022 2139   HCT 38.6 (L) 10/25/2022 2139   PLT 193 10/25/2022 2139   MCV 99.2 10/25/2022 2139   MCH 32.9 10/25/2022 2139   MCHC 33.2 10/25/2022 2139   RDW 12.6 10/25/2022 2139   LYMPHSABS 0.8 02/28/2022 1008   MONOABS 0.5 02/28/2022 1008   EOSABS 0.2 02/28/2022 1008   BASOSABS 0.0 02/28/2022 1008    CMP     Component Value Date/Time   NA 135 10/25/2022 2139   K 3.4 (L) 10/25/2022 2139  CL 99 10/25/2022 2139   CO2 25 10/25/2022 2139   GLUCOSE 95 10/25/2022 2139   BUN 13 10/25/2022 2139   CREATININE 0.94 10/25/2022 2139   CALCIUM 9.2 10/25/2022 2139   PROT 6.9 10/25/2022 2139   ALBUMIN 4.0 10/25/2022 2139   AST 22 10/25/2022 2139   ALT 6 10/25/2022 2139   ALKPHOS 51 10/25/2022 2139   BILITOT 0.8 10/25/2022 2139   GFRNONAA >60 10/25/2022 2139   GFRAA >60 01/05/2017 0250       Latest Ref Rng & Units 10/25/2022    9:39 PM 02/28/2022   10:08 AM 08/28/2021   11:21 AM  CBC EXTENDED  WBC 4.0 - 10.5 K/uL 3.8  3.5  4.2   RBC 4.22 - 5.81 MIL/uL 3.89  4.12  4.16   Hemoglobin 13.0 - 17.0 g/dL 08.6  57.8  46.9   HCT 39.0 - 52.0 % 38.6  40.7  41.4   Platelets 150 - 400 K/uL 193  192.0  195.0   NEUT# 1.4 - 7.7 K/uL  1.9  2.9   Lymph# 0.7 - 4.0 K/uL  0.8  0.6       ASSESSMENT AND PLAN: 76 year old male with Parkinson's with atypical dysphagia symptoms of several years without weight loss.  His description of symptoms do not seem very consistent with esophageal dysphagia.  It sounds like he had previously undergone EGD with dilation for the symptoms without much improvement.  My suspicion for an esophageal stricture/mass is very low.  We will obtain a barium esophagram to further evaluate for significant stricture/stenosis.  If normal, I would recommend the patient be evaluated by speech-language pathology.  Would not recommend pursuing EGD empirically at this time given risks of sedation, and low  likelihood of finding an abnormality to explain his symptoms. Symptoms not suggestive of esophageal dysphagia, suspect related to Parkinson's  Dysphagia, suspect pharyngeal source - Barium swallow -Speech-language pathology referral if barium swallow normal  Kunta Hilleary E. Tomasa Rand, MD [facility] .  [name], Georgina Quint, MD

## 2023-05-17 NOTE — Patient Instructions (Signed)
You have been scheduled for a Barium Esophogram at [facility] (1st floor of the hospital) on Tuesday 05/21/23 at 9 am. Please arrive 30 minutes prior to your appointment for registration. Make certain not to have anything to eat or drink 3 hours prior to your test. If you need to reschedule for any reason, please contact radiology at [phone] to do so. __________________________________________________________________ A barium swallow is an examination that concentrates on views of the esophagus. This tends to be a double contrast exam (barium and two liquids which, when combined, create a gas to distend the wall of the oesophagus) or single contrast (non-ionic iodine based). The study is usually tailored to your symptoms so a good history is essential. Attention is paid during the study to the form, structure and configuration of the esophagus, looking for functional disorders (such as aspiration, dysphagia, achalasia, motility and reflux) EXAMINATION You may be asked to change into a gown, depending on the type of swallow being performed. A radiologist and radiographer will perform the procedure. The radiologist will advise you of the type of contrast selected for your procedure and direct you during the exam. You will be asked to stand, sit or lie in several different positions and to hold a small amount of fluid in your mouth before being asked to swallow while the imaging is performed .In some instances you may be asked to swallow barium coated marshmallows to assess the motility of a solid food bolus. The exam can be recorded as a digital or video fluoroscopy procedure. POST PROCEDURE It will take 1-2 days for the barium to pass through your system. To facilitate this, it is important, unless otherwise directed, to increase your fluids for the next 24-48hrs and to resume your normal diet.  This test typically takes about 30 minutes to  perform. ______________________________________________________________  _______________________________________________________  If your blood pressure at your visit was 140/90 or greater, please contact your primary care physician to follow up on this.  _______________________________________________________  If you are age 22 or older, your body mass index should be between 23-30. Your Body mass index is 22.38 kg/m. If this is out of the aforementioned range listed, please consider follow up with your Primary Care Provider.  If you are age 40 or younger, your body mass index should be between 19-25. Your Body mass index is 22.38 kg/m. If this is out of the aformentioned range listed, please consider follow up with your Primary Care Provider.   ________________________________________________________  The [facility] providers would like to encourage you to use [facility] to communicate with providers for non-urgent requests or questions.  Due to long hold times on the telephone, sending your provider a message by [facility] may be a faster and more efficient way to get a response.  Please allow 48 business hours for a response.  Please remember that this is for non-urgent requests.  _______________________________________________________

## 2023-05-21 ENCOUNTER — Ambulatory Visit (HOSPITAL_COMMUNITY)
Admission: RE | Admit: 2023-05-21 | Discharge: 2023-05-21 | Disposition: A | Discharge status: Home / Self Care | Payer: No Typology Code available for payment source | Source: Ambulatory Visit | Attending: Gastroenterology | Admitting: Gastroenterology

## 2023-05-21 DIAGNOSIS — R1310 Dysphagia, unspecified: Secondary | ICD-10-CM | POA: Diagnosis present

## 2023-05-23 NOTE — Progress Notes (Signed)
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  There was no delay in passage of the barium tablet, which argues against a high-grade stricture.  Although it is not certain that the stricture is causing all of your swallowing difficulties, I will offer an EGD with dilation to see if this helps your swallowing. If you do not [name] to pursue an EGD, that is also reasonable.  Although not specifically designed to evaluate this, there was no obvious evidence of abnormal pharyngeal swallowing that would warrant speech pathology evaluation.  Please let us know if you would like to schedule an EGD with dilation.  We should be able to do this safely in our endoscopy facility.

## 2023-07-15 ENCOUNTER — Emergency Department (HOSPITAL_COMMUNITY): Payer: No Typology Code available for payment source

## 2023-07-15 ENCOUNTER — Other Ambulatory Visit: Payer: Self-pay

## 2023-07-15 ENCOUNTER — Encounter (HOSPITAL_COMMUNITY): Payer: Self-pay

## 2023-07-15 ENCOUNTER — Emergency Department (HOSPITAL_COMMUNITY)
Admission: EM | Admit: 2023-07-15 | Discharge: 2023-07-16 | Disposition: A | Discharge status: Home / Self Care | Payer: No Typology Code available for payment source | Attending: Emergency Medicine | Admitting: Emergency Medicine

## 2023-07-15 DIAGNOSIS — M25551 Pain in right hip: Secondary | ICD-10-CM | POA: Diagnosis present

## 2023-07-15 DIAGNOSIS — W19XXXA Unspecified fall, initial encounter: Secondary | ICD-10-CM | POA: Insufficient documentation

## 2023-07-15 DIAGNOSIS — G20A1 Parkinson's disease without dyskinesia, without mention of fluctuations: Secondary | ICD-10-CM | POA: Diagnosis not present

## 2023-07-15 LAB — URINALYSIS, ROUTINE W REFLEX MICROSCOPIC
Bacteria, UA: NONE SEEN
Bilirubin Urine: NEGATIVE
Glucose, UA: NEGATIVE mg/dL
Hgb urine dipstick: NEGATIVE
Ketones, ur: 5 mg/dL — AB
Nitrite: POSITIVE — AB
Protein, ur: NEGATIVE mg/dL
Specific Gravity, Urine: 1.023 (ref 1.005–1.030)
WBC, UA: >50 WBC/hpf (ref 0–5)
pH: 5 (ref 5.0–8.0)

## 2023-07-15 LAB — BASIC METABOLIC PANEL
Anion gap: 9 (ref 5–15)
BUN: 14 mg/dL (ref 8–23)
CO2: 22 mmol/L (ref 22–32)
Calcium: 9.3 mg/dL (ref 8.9–10.3)
Chloride: 107 mmol/L (ref 98–111)
Creatinine, Ser: 0.85 mg/dL (ref 0.61–1.24)
GFR, Estimated: >60 mL/min (ref >60)
Glucose, Bld: 104 mg/dL — ABNORMAL HIGH (ref 70–99)
Potassium: 3.5 mmol/L (ref 3.5–5.1)
Sodium: 138 mmol/L (ref 135–145)

## 2023-07-15 LAB — CBC WITH DIFFERENTIAL/PLATELET
Abs Immature Granulocytes: 0.01 10*3/uL (ref 0.00–0.07)
Basophils Absolute: 0 10*3/uL (ref 0.0–0.1)
Basophils Relative: 1 %
Eosinophils Absolute: 0.2 10*3/uL (ref 0.0–0.5)
Eosinophils Relative: 4 %
HCT: 39.6 % (ref 39.0–52.0)
Hemoglobin: 13.2 g/dL (ref 13.0–17.0)
Immature Granulocytes: 0 %
Lymphocytes Relative: 19 %
Lymphs Abs: 0.7 10*3/uL (ref 0.7–4.0)
MCH: 33.5 pg (ref 26.0–34.0)
MCHC: 33.3 g/dL (ref 30.0–36.0)
MCV: 100.5 fL — ABNORMAL HIGH (ref 80.0–100.0)
Monocytes Absolute: 0.5 10*3/uL (ref 0.1–1.0)
Monocytes Relative: 12 %
Neutro Abs: 2.5 10*3/uL (ref 1.7–7.7)
Neutrophils Relative %: 64 %
Platelets: 189 10*3/uL (ref 150–400)
RBC: 3.94 MIL/uL — ABNORMAL LOW (ref 4.22–5.81)
RDW: 11.9 % (ref 11.5–15.5)
WBC: 3.9 10*3/uL — ABNORMAL LOW (ref 4.0–10.5)
nRBC: 0 % (ref 0.0–0.2)

## 2023-07-15 NOTE — ED Triage Notes (Signed)
Pt c.o right sided hip pain that radiates down to his right knee. Pt has hx of parkinsons, states he has fallen twice in the past week.

## 2023-07-15 NOTE — ED Provider Triage Note (Signed)
Emergency Medicine Provider Triage Evaluation Note  Gib Aylsworth , a 77 y.o. male  was evaluated in triage.  Pt complains of back knee and hip pain on the right side worsening over the past several days.  He also has Parkinson's and states that it is worse and worse.  He has not had any medications adjustments to his Sinemet recently.  Review of Systems  Positive: Back and leg pain Negative: Weakness  Physical Exam  BP 122/66   Pulse 87   Temp 98 F (36.7 C) (Oral)   Resp 16   SpO2 97%  Gen:   Awake, no distress   Resp:  Normal effort  MSK:   Moves extremities without difficulty  Other:    Medical Decision Making  Medically screening exam initiated at 5:10 PM.  Appropriate orders placed.  Ananias Bliley was informed that the remainder of the evaluation will be completed by another provider, this initial triage assessment does not replace that evaluation, and the importance of remaining in the ED until their evaluation is complete.     Arthor Captain, PA-C 07/15/23 [phone]

## 2023-07-16 ENCOUNTER — Telehealth: Payer: Self-pay | Admitting: Neurology

## 2023-07-16 NOTE — ED Provider Notes (Signed)
[facility] DEPT [facility] Emergency Department Provider Note MRN:  [id number]  Arrival date & time: 07/16/23     Chief Complaint   Right hip pain  History of Present Illness   Lee [name] is a 77 y.o. year-old male presents to the ED with chief complaint of right hip pain.  He states that the pain radiates into his right leg.  He has hx of Parkinson's.  He lives at home, but has a caregiver that comes in the evening hours.  He states that he normally walks with a walker or a cane.  He states that he has had some increasing falls.  He denies any other symptoms.  History provided by patient.   Review of Systems  Pertinent positive and negative review of systems noted in HPI.    Physical Exam   Vitals:   07/15/23 2126 07/16/23 0241  BP: 113/66 128/70  Pulse: 82 78  Resp: 17 18  Temp: 98.3 F (36.8 C) 98.7 F (37.1 C)  SpO2: 99% 99%    CONSTITUTIONAL:  non toxic-appearing, NAD NEURO:  Alert and oriented x 3, CN 3-12 grossly intact EYES:  eyes equal and reactive ENT/NECK:  Supple, no stridor  CARDIO:  normal rate, appears well-perfused  PULM:  No respiratory distress,  GI/GU:  non-distended,  MSK/SPINE:  No gross deformities, no edema, moves all extremities, ambulates with cane SKIN:  no rash, atraumatic   *Additional and/or pertinent findings included in MDM below  Diagnostic and Interventional Summary    EKG Interpretation Date/Time:    Ventricular Rate:    PR Interval:    QRS Duration:    QT Interval:    QTC Calculation:   R Axis:      Text Interpretation:         Labs Reviewed  BASIC METABOLIC PANEL - Abnormal; Notable for the following components:      Result Value   Glucose, Bld 104 (*)    All other components within normal limits  CBC WITH DIFFERENTIAL/PLATELET - Abnormal; Notable for the following components:   WBC 3.9 (*)    RBC 3.94 (*)    MCV 100.5 (*)    All other components within normal limits  URINALYSIS, ROUTINE W REFLEX  MICROSCOPIC - Abnormal; Notable for the following components:   Color, Urine AMBER (*)    APPearance CLOUDY (*)    Ketones, ur 5 (*)    Nitrite POSITIVE (*)    Leukocytes,Ua MODERATE (*)    All other components within normal limits  URINE CULTURE    DG Knee Complete 4 Views Right  Final Result    DG Lumbar Spine 2-3 Views  Final Result    DG HIP UNILAT WITH PELVIS 2-3 VIEWS RIGHT  Final Result      Medications - No data to display   Procedures  /  Critical Care Procedures  ED Course and Medical Decision Making  I have reviewed the triage vital signs, the nursing notes, and pertinent available records from the EMR.  Social Determinants Affecting Complexity of Care: Patient has problems living alone.   ED Course:    Medical Decision Making Patient here with frequent falls and right hip pain.  Plain films are negative for fracture.  He does have arthritis and possible osteo necrosis/infarct.  Will refer to ortho.  Will consult SW and see if they can help set patient up with home health/PT/OT.  Patient has abnormal UA, but no symptoms.  Will send for culture.  Amount and/or Complexity of Data Reviewed Labs: ordered.         Consultants: No consultations were needed in caring for this patient.   Treatment and Plan: Emergency department workup does not suggest an emergent condition requiring admission or immediate intervention beyond  what has been performed at this time. The patient is safe for discharge and has  been instructed to return immediately for worsening symptoms, change in  symptoms or any other concerns    Final Clinical Impressions(s) / ED Diagnoses     ICD-10-CM   1. Pain of right hip  M25.551       ED Discharge Orders     None         Discharge Instructions Discussed with and Provided to Patient:     Discharge Instructions      Our social worker will contact you about getting set up with physical therapy.  Please also make an  appointment with the orthopedic doctor listed.       Roxy Horseman, PA-C 07/16/23 1308    Zadie Rhine, MD 07/16/23 [phone]

## 2023-07-16 NOTE — Telephone Encounter (Signed)
Good morning this pt called and stated that he was seen in the ER last night he wants to make Dr Tat aware of the event and wanted to make a plan.Marland KitchenMarland Kitchen

## 2023-07-16 NOTE — Discharge Instructions (Addendum)
Our social worker will contact you about getting set up with physical therapy.  Please also make an appointment with the orthopedic doctor listed.

## 2023-07-16 NOTE — ED Notes (Signed)
Pt verbalized that he was able to get in the car and home once arrived.

## 2023-07-16 NOTE — Telephone Encounter (Signed)
Called and spoke to patient and informed him of Dr. Don Perking advice that  He was in the ER with R hip pain.  That is out of my field.   He should f/u with pcp.  Informed patient that he will need to contact  [facility] and his PCP in regards to his right hip pain. Patient was provided  [facility] phone number and understood what was explained to him. Patient had no further questions or concerns.

## 2023-07-17 LAB — URINE CULTURE

## 2023-08-07 ENCOUNTER — Encounter: Payer: Self-pay | Admitting: Neurology

## 2023-10-22 NOTE — Progress Notes (Deleted)
 Assessment/Plan:   1.  Parkinsons disease, with symptoms back to 2016 and likely started on levodopa in 2022 by [facility]             -Patient has no rigidity but does have bradykinesia and tremor, thus meeting diagnostic criteria.             -DaTscan done in 2022 at [facility] was reported to be abnormal with decreased uptake in the bilateral putamen. -Levodopa challenge done on May 03, 2023, which did show benefit to levodopa.  He had significant difficulty arising out of a chair prior to the addition of levodopa.  Following the addition, he was able to arise without the use of his hands (albeit slowly).  Nonetheless, he was not convinced and did not want to take the medication yet.  He felt the improvement was because he sat in the room and rested for a long time waiting for the levodopa to kick in.  We talked about risks and benefits, and he decided to hold off on taking the medication. -Discussed with patient that the dragging of the right leg and the freezing was from Parkinson's disease, but the right lower extremity pain and swelling around the knee was not from Parkinsons.  Unfortunately, he has declined all medication.  2.  Low blood pressure  -Patient experiencing dizziness.  Blood pressures have been trending significantly low, including today.  Blood pressure was taken prior to the addition of levodopa.  After some discussion, he was agreeable to trying low-dose midodrine, 2.5 mg 3 times per day with meals.  Discussed risk, benefits, side effects, including risk of supine hypertension.  He is going to start checking his blood pressure at home in various positions.  3.  right lower extremity weakness             -Suspect that this may be from his severe lumbar spinal stenosis.  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  Patient has been told the last several visits to make follow-ups with [facility] who he was seeing for this.  He has been to the emergency  room for this as well and they also told him to follow-up with orthopedics.     Subjective:   Lee [name] was seen today in follow up for Parkinsons disease.  My previous records were reviewed prior to todays visit as well as outside records available to me. Pt seen last visit for levodopa challenge.  Levodopa challenge demonstrated benefit to levodopa, but patient declined taking medication.  We have been talking to the patient about symptoms of Parkinson's disease (freezing, rigidity) and how those differ from his low back pain and lumbar spinal stenosis.  He has gotten those diagnoses confused and intertwined with 1 another, which is part of the reason I think that he declines Parkinson's medicines.  We have asked him to follow-up with his orthopedic doctor for his back.  He was in the emergency room December 16 regarding his hip and leg pain.  He also had increasing falls.  Physical therapy was set up and he was told again to make an appointment with orthopedics.  Instead, he called [place] the following day about his hip pain and we told him to call his orthopedic specialist at [facility] orthopedics.  He was given the phone number.  Current prescribed movement disorder medications: None   PREVIOUS MEDICATIONS: Sinemet  ALLERGIES:   Allergies  Allergen Reactions   Acyclovir Swelling   Enalapril Maleate Other (  See Comments)    Reaction:  Unknown     CURRENT MEDICATIONS:  No outpatient medications have been marked as taking for the 10/23/23 encounter (Appointment) with Zamire Whitehurst, Octaviano Batty, DO.     Objective:   PHYSICAL EXAMINATION:    VITALS:   There were no vitals filed for this visit.   GEN:  The patient appears stated age and is in NAD. HEENT:  Normocephalic, atraumatic.  The mucous membranes are moist. The superficial temporal arteries are without ropiness or tenderness. CV:  RRR Lungs:  CTAB Neck/HEME:  There are no carotid bruits bilaterally.  Neurological  examination:  Orientation: The patient is alert and oriented x3. Cranial nerves: There is good facial symmetry with significant facial hypomimia. The speech is fluent and significantly dysarthric. Soft palate rises symmetrically and there is no tongue deviation. Hearing is intact to conversational tone. Sensation: Sensation is intact to light touch throughout Motor: Strength is at least antigravity x4.  Levodopa challenge done today.  UPDRS motor off score was 34.  Pt then given to 50 mg of levodopa dissolved in ginger ale and waited to re-examine him.  UPDRS motor on score was 24.  Details of UPDRS motor score documented on separate neurophysiologic worksheet.     Movement examination: Tone: There is normal tone in the bilateral upper extremities.  The tone in the lower extremities is normal.  This was before and after the addition of levodopa Abnormal movements: There is an right upper extremity tremor and reemergent tremor prior to the addition of levodopa.  This was markedly improved post levodopa. Coordination:  There is decremation with rapid alternating movements with finger taps and hand opening and closing and toe taps on the right.  This was improved post levodopa. Gait and Station: The patient require was pushing off to arise prior to levodopa.  Post levodopa, he is able to rise without the use of his hands, although he does struggle somewhat with this.  Prior to levodopa, he is forward flexed and drags the right leg and freezes.  Following levodopa, he ambulates much better and walks a significant amount of way down the hallway all the way to the restroom.  I have reviewed and interpreted the following labs independently    Chemistry      Component Value Date/Time   NA 138 07/15/2023 1715   K 3.5 07/15/2023 1715   CL 107 07/15/2023 1715   CO2 22 07/15/2023 1715   BUN 14 07/15/2023 1715   CREATININE 0.85 07/15/2023 1715      Component Value Date/Time   CALCIUM 9.3  07/15/2023 1715   ALKPHOS 51 10/25/2022 2139   AST 22 10/25/2022 2139   ALT 6 10/25/2022 2139   BILITOT 0.8 10/25/2022 2139       Lab Results  Component Value Date   WBC 3.9 (L) 07/15/2023   HGB 13.2 07/15/2023   HCT 39.6 07/15/2023   MCV 100.5 (H) 07/15/2023   PLT 189 07/15/2023    Lab Results  Component Value Date   TSH 1.98 02/28/2022     Total time spent on today's visit was *** minutes, including both face-to-face time and nonface-to-face time.  Time included that spent on review of records (prior notes available to me/labs/imaging if pertinent), discussing treatment and goals, answering patient's questions and coordinating care.  Cc:  System, Provider Not In

## 2023-10-23 ENCOUNTER — Ambulatory Visit: Payer: No Typology Code available for payment source | Admitting: Neurology

## 2023-10-25 ENCOUNTER — Ambulatory Visit: Payer: No Typology Code available for payment source | Admitting: Neurology

## 2023-11-13 ENCOUNTER — Other Ambulatory Visit: Payer: Self-pay

## 2023-11-13 ENCOUNTER — Emergency Department (HOSPITAL_COMMUNITY)

## 2023-11-13 ENCOUNTER — Encounter (HOSPITAL_COMMUNITY): Payer: Self-pay | Admitting: *Deleted

## 2023-11-13 ENCOUNTER — Emergency Department (HOSPITAL_COMMUNITY)
Admission: EM | Admit: 2023-11-13 | Discharge: 2023-11-14 | Disposition: A | Discharge status: Home / Self Care | Attending: Emergency Medicine | Admitting: Emergency Medicine

## 2023-11-13 DIAGNOSIS — R791 Abnormal coagulation profile: Secondary | ICD-10-CM | POA: Diagnosis not present

## 2023-11-13 DIAGNOSIS — R29898 Other symptoms and signs involving the musculoskeletal system: Secondary | ICD-10-CM

## 2023-11-13 DIAGNOSIS — R519 Headache, unspecified: Secondary | ICD-10-CM | POA: Diagnosis not present

## 2023-11-13 DIAGNOSIS — Z79899 Other long term (current) drug therapy: Secondary | ICD-10-CM | POA: Insufficient documentation

## 2023-11-13 DIAGNOSIS — R4781 Slurred speech: Secondary | ICD-10-CM | POA: Diagnosis not present

## 2023-11-13 DIAGNOSIS — R42 Dizziness and giddiness: Secondary | ICD-10-CM | POA: Insufficient documentation

## 2023-11-13 DIAGNOSIS — R531 Weakness: Secondary | ICD-10-CM | POA: Insufficient documentation

## 2023-11-13 NOTE — ED Triage Notes (Signed)
 Pt says that he has weakness in the right lower leg for several days (obvious weakness and drift on assessment RLE). He says his speech is more slurred than normal from time to time, he has some headaches and dizziness. No numbness or tingling. Reports taking all his medications as prescribed.

## 2023-11-13 NOTE — ED Provider Notes (Signed)
 [place] EMERGENCY DEPARTMENT AT [facility] Provider Note   CSN: [id number] Arrival date & time: 11/13/23  2315     History {Add pertinent medical, surgical, social history, OB history to HPI:1} Chief Complaint  Patient presents with   Weakness    Lee [name] is a 78 y.o. male.  Patient presents to the emergency department for evaluation of leg weakness.  Patient reports that he has been experiencing weakness in the leg for "some time" but in the last day it has worsened.  Patient noted to have some slurred speech when he reports that he is having more difficulty speaking than usual.  He has had intermittent headaches and feels dizzy.  Patient denies any back pain, no injury.       Home Medications Prior to Admission medications   Medication Sig Start Date End Date Taking? Authorizing Provider  albuterol (PROVENTIL HFA;VENTOLIN HFA) 108 (90 Base) MCG/ACT inhaler Inhale 2 puffs into the lungs every 4 (four) hours as needed for wheezing or shortness of breath. 01/05/17   Dione Booze, MD  Alum Hydroxide-Mag Carbonate 160-105 MG CHEW CHEW 1 TABLET BY MOUTH AS DIRECTED BY YOUR PROVIDER FOR STOMACH UPSET/DYSPEPSIA UP TO THREE TIMES DAILY 10/31/21   [provider]  ascorbic acid (VITAMIN C) 500 MG tablet TAKE ONE TABLET BY MOUTH DAILY TO HELP WITH IRON STORES AND IMMUNE SYSTEM. PLEASE TAKE EVERY DAY AND ESPECIALLY WHEN YOU TAKE FERROUS SULFATE. 04/17/21   [provider]  BLACK PEPPER-TURMERIC PO Take by mouth daily. Mixes as a powder for back pain (ginger, cinnamon, black pepper, turmeric)    [provider]  Cholecalciferol 50 MCG (2000 UT) TABS     [provider]  CINNAMON PO Mixes as a powder for back pain (ginger, cinnamon, black pepper, turmeric) 02/20/18   [provider]  diclofenac Sodium (VOLTAREN) 1 % GEL     [provider]  FEROSUL 325 (65 Fe) MG tablet Take by mouth. 01/05/21   [provider]   fluticasone (FLONASE) 50 MCG/ACT nasal spray INSTILL 1 SPRAY IN EACH NOSTRIL TWICE A DAY FOR DIZZINESS 05/10/20   [provider]  folic acid (FOLVITE) 1 MG tablet Take 1 mg by mouth daily.    [provider]  Ginger, Zingiber officinalis, (GINGER PO) Mixes as a powder for back pain (ginger, cinnamon, black pepper, turmeric) 02/20/18   [provider]  hydrochlorothiazide (HYDRODIURIL) 25 MG tablet     [provider]  midodrine (PROAMATINE) 2.5 MG tablet Take 1 tablet (2.5 mg total) by mouth 3 (three) times daily with meals. 05/03/23   Tat, Octaviano Batty, DO  mirtazapine (REMERON) 7.5 MG tablet Take 1 tablet (7.5 mg total) by mouth at bedtime. 07/05/21   Penumalli, Glenford Bayley, MD  mometasone-formoterol (DULERA) 200-5 MCG/ACT AERO Inhale 2 puffs into the lungs in the morning and at bedtime. 05/31/21   Luciano Cutter, MD  Multiple Vitamin Brandt Loosen) TABS Take by mouth.    [provider]  oxybutynin (DITROPAN-XL) 10 MG 24 hr tablet Take 10 mg by mouth at bedtime.    [provider]  pantoprazole (PROTONIX) 40 MG tablet Take by mouth.    [provider]  vitamin C (ASCORBIC ACID) 500 MG tablet Take by mouth. 01/17/21   [provider]      Allergies    Acyclovir and Enalapril maleate    Review of Systems   Review of Systems  Physical Exam Updated Vital Signs BP 123/68 (  BP Location: Right Arm)   Pulse 82   Temp 97.7 F (36.5 C) (Oral)   Resp 16   SpO2 100%  Physical Exam Vitals and nursing note reviewed.  Constitutional:      General: He is not in acute distress.    Appearance: He is well-developed.  HENT:     Head: Normocephalic and atraumatic.     Mouth/Throat:     Mouth: Mucous membranes are moist.  Eyes:     General: Vision grossly intact. Gaze aligned appropriately.     Extraocular Movements: Extraocular movements intact.     Conjunctiva/sclera: Conjunctivae normal.  Cardiovascular:     Rate and Rhythm:  Normal rate and regular rhythm.     Pulses: Normal pulses.     Heart sounds: Normal heart sounds, S1 normal and S2 normal. No murmur heard.    No friction rub. No gallop.  Pulmonary:     Effort: Pulmonary effort is normal. No respiratory distress.     Breath sounds: Normal breath sounds.  Abdominal:     Palpations: Abdomen is soft.     Tenderness: There is no abdominal tenderness. There is no guarding or rebound.     Hernia: No hernia is present.  Musculoskeletal:        General: No swelling.     Cervical back: Full passive range of motion without pain, normal range of motion and neck supple. No pain with movement, spinous process tenderness or muscular tenderness. Normal range of motion.     Right lower leg: No edema.     Left lower leg: No edema.  Skin:    General: Skin is warm and dry.     Capillary Refill: Capillary refill takes less than 2 seconds.     Findings: No ecchymosis, erythema, lesion or wound.  Neurological:     Mental Status: He is alert and oriented to person, place, and time.     GCS: GCS eye subscore is 4. GCS verbal subscore is 5. GCS motor subscore is 6.     Cranial Nerves: Cranial nerves 2-12 are intact.     Sensory: Sensation is intact.     Motor: Weakness (Right lower extremity) present. No abnormal muscle tone.     Coordination: Coordination is intact.  Psychiatric:        Mood and Affect: Mood normal.        Speech: Speech normal.        Behavior: Behavior normal.     ED Results / Procedures / Treatments   Labs (all labs ordered are listed, but only abnormal results are displayed) Labs Reviewed  ETHANOL  PROTIME-INR  APTT  CBC  DIFFERENTIAL  COMPREHENSIVE METABOLIC PANEL WITH GFR  ETHANOL  RAPID URINE DRUG SCREEN, HOSP PERFORMED  URINALYSIS, ROUTINE W REFLEX MICROSCOPIC    EKG None  Radiology No results found.  Procedures Procedures  {Document cardiac monitor, telemetry assessment procedure when appropriate:1}  Medications Ordered  in ED Medications - No data to display  ED Course/ Medical Decision Making/ A&P   {   Click here for ABCD2, HEART and other calculatorsREFRESH Note before signing :1}                              Medical Decision Making Amount and/or Complexity of Data Reviewed Labs: ordered. Radiology: ordered.   ***  {Document critical care time when appropriate:1} {Document review of labs and clinical decision tools ie heart  score, Chads2Vasc2 etc:1}  {Document your independent review of radiology images, and any outside records:1} {Document your discussion with family members, caretakers, and with consultants:1} {Document social determinants of health affecting pt's care:1} {Document your decision making why or why not admission, treatments were needed:1} Final Clinical Impression(s) / ED Diagnoses Final diagnoses:  None    Rx / DC Orders ED Discharge Orders     None

## 2023-11-13 NOTE — ED Triage Notes (Signed)
 Pt arrives from home (lives by himself)  via [place], hx of parkinsons/empysema. Right leg weakness over the past week, worse over 2-3 days, barely picking up the right leg. Worsening difficulty speaking (baseline has had difficulty speaking d/t parkinson, now worse). Dizziness sitting and standing. Denies injury. A/O x 4. 114/68, hr 80, 98%, cbg 152

## 2023-11-14 ENCOUNTER — Emergency Department (HOSPITAL_COMMUNITY)

## 2023-11-14 LAB — CBC
HCT: 38 % — ABNORMAL LOW (ref 39.0–52.0)
Hemoglobin: 12.4 g/dL — ABNORMAL LOW (ref 13.0–17.0)
MCH: 32.8 pg (ref 26.0–34.0)
MCHC: 32.6 g/dL (ref 30.0–36.0)
MCV: 100.5 fL — ABNORMAL HIGH (ref 80.0–100.0)
Platelets: 178 10*3/uL (ref 150–400)
RBC: 3.78 MIL/uL — ABNORMAL LOW (ref 4.22–5.81)
RDW: 12.7 % (ref 11.5–15.5)
WBC: 3.9 10*3/uL — ABNORMAL LOW (ref 4.0–10.5)
nRBC: 0 % (ref 0.0–0.2)

## 2023-11-14 LAB — COMPREHENSIVE METABOLIC PANEL WITH GFR
ALT: 15 U/L (ref 0–44)
AST: 23 U/L (ref 15–41)
Albumin: 3.4 g/dL — ABNORMAL LOW (ref 3.5–5.0)
Alkaline Phosphatase: 55 U/L (ref 38–126)
Anion gap: 7 (ref 5–15)
BUN: 15 mg/dL (ref 8–23)
CO2: 26 mmol/L (ref 22–32)
Calcium: 9.1 mg/dL (ref 8.9–10.3)
Chloride: 104 mmol/L (ref 98–111)
Creatinine, Ser: 0.96 mg/dL (ref 0.61–1.24)
GFR, Estimated: >60 mL/min (ref >60)
Glucose, Bld: 90 mg/dL (ref 70–99)
Potassium: 3.8 mmol/L (ref 3.5–5.1)
Sodium: 137 mmol/L (ref 135–145)
Total Bilirubin: 0.6 mg/dL (ref 0.0–1.2)
Total Protein: 6 g/dL — ABNORMAL LOW (ref 6.5–8.1)

## 2023-11-14 LAB — RAPID URINE DRUG SCREEN, HOSP PERFORMED
Amphetamines: NOT DETECTED
Barbiturates: NOT DETECTED
Benzodiazepines: NOT DETECTED
Cocaine: NOT DETECTED
Opiates: NOT DETECTED
Tetrahydrocannabinol: NOT DETECTED

## 2023-11-14 LAB — DIFFERENTIAL
Abs Immature Granulocytes: 0.01 10*3/uL (ref 0.00–0.07)
Basophils Absolute: 0 10*3/uL (ref 0.0–0.1)
Basophils Relative: 1 %
Eosinophils Absolute: 0.2 10*3/uL (ref 0.0–0.5)
Eosinophils Relative: 4 %
Immature Granulocytes: 0 %
Lymphocytes Relative: 22 %
Lymphs Abs: 0.9 10*3/uL (ref 0.7–4.0)
Monocytes Absolute: 0.6 10*3/uL (ref 0.1–1.0)
Monocytes Relative: 14 %
Neutro Abs: 2.3 10*3/uL (ref 1.7–7.7)
Neutrophils Relative %: 59 %

## 2023-11-14 LAB — URINALYSIS, ROUTINE W REFLEX MICROSCOPIC
Bacteria, UA: NONE SEEN
Bilirubin Urine: NEGATIVE
Glucose, UA: NEGATIVE mg/dL
Hgb urine dipstick: NEGATIVE
Ketones, ur: NEGATIVE mg/dL
Nitrite: POSITIVE — AB
Protein, ur: NEGATIVE mg/dL
Specific Gravity, Urine: 1.021 (ref 1.005–1.030)
pH: 6 (ref 5.0–8.0)

## 2023-11-14 LAB — ETHANOL: Alcohol, Ethyl (B): <10 mg/dL (ref <10)

## 2023-11-14 LAB — PROTIME-INR
INR: 1.1 (ref 0.8–1.2)
Prothrombin Time: 14.8 s (ref 11.4–15.2)

## 2023-11-14 LAB — APTT: aPTT: 30 s (ref 24–36)

## 2023-11-14 NOTE — ED Provider Notes (Signed)
 Patient evaluated by physical therapy who recommended SNF placement.  Patient unfortunately has refused and would like to go home at this time.  Home health PT and OT have been ordered.  Patient discharged with instructions to follow-up with his primary doctor.   Ninetta Basket, MD 11/14/23 [phone]

## 2023-11-14 NOTE — ED Provider Notes (Signed)
 Emergency Medicine Observation Re-evaluation Note  Lee [name] is a 78 y.o. male, seen on rounds today.  Pt initially presented to the ED for complaints of Weakness Currently, the patient is not having any acute complaints.   Physical Exam  BP 116/64 (BP Location: Right Arm)   Pulse 72   Temp 98 F (36.7 C) (Oral)   Resp 15   SpO2 100%  Physical Exam General: Resting comfortably in stretcher conversant Lungs: Normal work of breathing Psych: Calm  ED Course / MDM  EKG:EKG Interpretation Date/Time:  Thursday November 14 2023 00:08:10 EDT Ventricular Rate:  76 PR Interval:  250 QRS Duration:  118 QT Interval:  390 QTC Calculation: 438 R Axis:   85  Text Interpretation: Sinus rhythm with 1st degree A-V block Right bundle branch block Abnormal ECG When compared with ECG of 05-Jan-2017 02:53,  Inc RBBB now present Confirmed by Ballard Bongo [phone]) on 11/14/2023 12:36:12 AM  I have reviewed the labs performed to date as well as medications administered while in observation.  Recent changes in the last 24 hours include presented last night with leg weakness.  Had MRI and was medically cleared.  Awaiting therapy evaluation at this time.  Plan  Current plan is for PT consult.    Ninetta Basket, MD 11/14/23 [phone]

## 2023-11-14 NOTE — ED Notes (Signed)
 Pt given urinal and instructed to provide urine sample when able to

## 2023-11-14 NOTE — Discharge Instructions (Signed)
 You were seen for your leg weakness in the emergency department.   At home, please continue to work with physical and Occupational Therapy.    Check your MyChart online for the results of any tests that had not resulted by the time you left the emergency department.   Follow-up with your primary doctor in 2-3 days regarding your visit.    Return immediately to the emergency department if you experience any of the following: Falls, worsening weakness, numbness, bowel or bladder incontinence, or any other concerning symptoms.    Thank you for visiting our Emergency Department. It was a pleasure taking care of you today.

## 2023-11-14 NOTE — ED Notes (Signed)
PTAR called pt to aof

## 2023-11-14 NOTE — ED Notes (Signed)
 PT finished working with pt at this time. RN observed pt unable to completely ambulate and c/o sharp pain in hips and legs.

## 2023-11-14 NOTE — ED Notes (Signed)
 Patient transported to MRI

## 2023-11-14 NOTE — ED Notes (Signed)
 CCMD notifed

## 2023-11-14 NOTE — Progress Notes (Signed)
 Physical Therapy Evaluation Patient Details Name: Lee [name] MRN: [id number] DOB: 09-06-1945 Today's Date: 11/14/2023  History of Present Illness  78 y.o. male presents to ED 4/16 with complaints of difficulty with ambulation and speech and dizziness in standing and intermittent headaches. Found to have BP 114/68.  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 PMH Parkinson's, Empysema.  Clinical Impression  PTA pt living alone in single story home with ramped entrance but uneven ground without rail from driveway to ramp (about 8 feet). Pt reports independence with ambulation using cane until the past 3 weeks when he has been increasingly week in his R LE so using his Rollator. Pt reports independence in bathing and dressing but states it takes him 2 hours to complete. Has HHAide 3-4 hrs M-TH, and caregiver(?) who provides transportation to appointments but can no provide physical assist. Pt also reports he drives but that it scares him. Pt is currently limited by onset of pain in weakness radiating down his R LE with standing and walking, likely due to spinal stenosis. Pt is mod I for bed mobility and transfer, but quickly progress from supervision from supervision to min A with ambulation of 8 feet with increasing R LE weakness and foot drop, and narrowing BoS. PT recommends SNF level rehab but pt adamantly refuses, so will need PTAR transfer into his home and HHPT. Plan for discharge today, however PT will continue to follow until discharge is complete.        If plan is discharge home, recommend the following: A lot of help with walking and/or transfers;A little help with bathing/dressing/bathroom;Assistance with cooking/housework;Assist for transportation;Help  with stairs or ramp for entrance   Can travel by private vehicle   No    Equipment Recommendations  (has necessary equipment)  Recommendations for Other Services       Functional Status Assessment Patient has had a recent decline in their functional status and demonstrates the ability to make significant improvements in function in a reasonable and predictable amount of time.     Precautions / Restrictions Precautions Precautions: Fall Restrictions Weight Bearing Restrictions Per Provider Order: No      Mobility  Bed Mobility Overal bed mobility: Modified Independent             General bed mobility comments: increased time and effort but able to come to EOB without assist. Uses UE to get his R LE back into the bed    Transfers Overall transfer level: Modified independent Equipment used: Rolling walker (2 wheels)               General transfer comment: slowed, but able to power up to standing with RW, and use urinal without assist    Ambulation/Gait Ambulation/Gait assistance: Supervision, Min assist, Contact guard assist Gait Distance (Feet): 8 Feet Assistive device: Rolling walker (2 wheels) Gait Pattern/deviations: Step-to pattern, Decreased step length - left, Decreased stance time - right, Decreased dorsiflexion - right, Decreased weight shift to right, Antalgic, Narrow base of support, Trunk flexed Gait velocity: slowed Gait velocity interpretation: <1.31 ft/sec, indicative of household ambulator Pre-gait activities: marching in place General Gait Details: supervision quickly progressing to minA as pain and weakness increase in his R LE, with increasingly profound foot drop and narrowing BoS  Balance Overall balance assessment: Needs assistance Sitting-balance support: Feet unsupported, Bilateral upper extremity supported, Single extremity supported, No upper extremity supported Sitting balance-Leahy Scale: Good     Standing balance  support: Bilateral upper extremity supported, During functional activity, Reliant on assistive device for balance Standing balance-Leahy Scale: Poor                               Pertinent Vitals/Pain Pain Assessment Pain Assessment: 0-10 Pain Score: 3  Pain Location: back and R LE Pain Descriptors / Indicators: Grimacing, Guarding, Radiating Pain Intervention(s): Limited activity within patient's tolerance, Monitored during session, Repositioned    Home Living Family/patient expects to be discharged to:: Private residence Living Arrangements: Alone Available Help at Discharge: Personal care attendant;Other (Comment) (PCA M-Th 3-4 hrs in afternoon, mainly meal prep and laundry, caregiver who provides transportation to appointments) Type of Home: House Home Access: Ramped entrance (7-8 feet of walkway between driveway and ramp without railing)       Home Layout: One level Home Equipment: Agricultural consultant (2 wheels);Rollator (4 wheels);Cane - single point;Shower seat - built in;Grab bars - toilet;Grab bars - tub/shower;Hand held shower head;Electric scooter      Prior Function Prior Level of Function : Independent/Modified Independent;Driving             Mobility Comments: was ambulating with cane, but increasing pain and weakness in R LE making it increasibly difficult ADLs Comments: independent with ADLs, HHAide for iADLs     Extremity/Trunk Assessment   Upper Extremity Assessment Upper Extremity Assessment: Overall WFL for tasks assessed    Lower Extremity Assessment Lower Extremity Assessment: RLE deficits/detail RLE Deficits / Details: PROM WFL, strength in hip   flexion and dorsiflexion 2/5 RLE Sensation: decreased light touch RLE Coordination: decreased fine motor;decreased gross motor       Communication   Communication Communication: Impaired Factors Affecting Communication: Reduced clarity of speech (Parkinson's)    Cognition Arousal:  Alert Behavior During Therapy: WFL for tasks assessed/performed   PT - Cognitive impairments: Safety/Judgement, Awareness                       PT - Cognition Comments: explained nature of stenosis and decreased ability to activate muscle with nerve involvement and that no amount of strength will make R LE work if it does not get the signal to activate, hopeful green tea from alt med provider will fix Following commands: Impaired Following commands impaired: Follows one step commands with increased time, Follows multi-step commands with increased time     Cueing Cueing Techniques: Verbal cues, Gestural cues, Tactile cues, Visual cues     General Comments General comments (skin integrity, edema, etc.): VSS on RA        Assessment/Plan    PT Assessment Patient needs continued PT services  PT Problem List Decreased strength;Decreased range of motion;Decreased activity tolerance;Decreased balance;Decreased mobility;Decreased coordination;Decreased cognition;Decreased safety awareness;Impaired sensation;Pain       PT Treatment Interventions DME instruction;Gait training;Functional mobility training;Therapeutic activities;Therapeutic exercise;Balance training;Neuromuscular re-education;Cognitive remediation;Patient/family education    PT Goals (Current goals can be found in the Care Plan section)  Acute Rehab PT Goals Patient Stated Goal: go home PT Goal Formulation: With patient Time For Goal Achievement: 11/28/23 Potential to Achieve Goals: Fair    Frequency Min 2X/week        AM-PAC PT "6 Clicks" Mobility  Outcome Measure Help needed turning from your back  to your side while in a flat bed without using bedrails?: A Little Help needed moving from lying on your back to sitting on the side of a flat bed without using bedrails?: A Little Help needed moving to and from a bed to a chair (including a wheelchair)?: A Little Help needed standing up from a chair using your  arms (e.g., wheelchair or bedside chair)?: A Little Help needed to walk in hospital room?: A Lot Help needed climbing 3-5 steps with a railing? : Total 6 Click Score: 15    End of Session   Activity Tolerance: Patient limited by pain;Treatment limited secondary to medical complications (Comment) (increased R LE weakness likely due to spinal stenosis) Patient left: in bed Nurse Communication: Mobility status PT Visit Diagnosis: Unsteadiness on feet (R26.81);Other abnormalities of gait and mobility (R26.89);Muscle weakness (generalized) (M62.81);Difficulty in walking, not elsewhere classified (R26.2);Ataxic gait (R26.0);Other symptoms and signs involving the nervous system (R29.898);Pain Pain - Right/Left: Right Pain - part of body: Leg (low back)    Time: 9604-5409 PT Time Calculation (min) (ACUTE ONLY): 46 min   Charges:   PT Evaluation $PT Eval Moderate Complexity: 1 Mod PT Treatments $Gait Training: 8-22 mins $Therapeutic Activity: 8-22 mins PT General Charges $$ ACUTE PT VISIT: 1 Visit         Armando Bukhari B. Jewel Mortimer PT, DPT Acute Rehabilitation Services Please use secure chat or  Call Office [phone]   Verlie Glisson [facility] 11/14/2023, 10:40 AM

## 2023-11-14 NOTE — ED Notes (Signed)
 Pt stating to multiple different hospital staff members, that he is having pain and would like something for pain. However, this RN has gone to bedside to assess pain and has offered to contact doctor to order pain meds, and pt refusing pain meds to this RN. Pt states his legs and hips are hurting, and he "wants to go home"

## 2023-11-14 NOTE — Progress Notes (Addendum)
 CSW received consult for possible SNF placement for patient.  CSW spoke with PT who states she will recommend patient for SNF but he will likely refuse.  CSW spoke with patient who states he wants to go home and does not want to go to a facility. Patient states he is agreeable to home health services. Patient states he has his house keys to gain entry to his home.  Per PING - patient was previously active with [facility] in February.  CSW spoke with Moishe Angel at [facility] who states the agency can accept patient for PT and OT.  CSW spoke with MD to request HH orders for PT and OT.  CSW informed RN of information.  CSW attempted to reach patient's sister Sydna Evangelist without success - a voicemail was left requesting a return call.  Shepard Dicker, MSW, LCSW Transitions of Care  Clinical Social Worker II [phone]

## 2023-11-21 ENCOUNTER — Other Ambulatory Visit: Payer: Self-pay

## 2023-11-21 ENCOUNTER — Encounter (HOSPITAL_COMMUNITY): Payer: Self-pay

## 2023-11-21 ENCOUNTER — Emergency Department (HOSPITAL_COMMUNITY)

## 2023-11-21 ENCOUNTER — Emergency Department (HOSPITAL_COMMUNITY)
Admission: EM | Admit: 2023-11-21 | Discharge: 2023-11-22 | Disposition: A | Discharge status: Home / Self Care | Attending: Emergency Medicine | Admitting: Emergency Medicine

## 2023-11-21 DIAGNOSIS — S40012A Contusion of left shoulder, initial encounter: Secondary | ICD-10-CM | POA: Insufficient documentation

## 2023-11-21 DIAGNOSIS — W010XXA Fall on same level from slipping, tripping and stumbling without subsequent striking against object, initial encounter: Secondary | ICD-10-CM | POA: Diagnosis not present

## 2023-11-21 DIAGNOSIS — G20A1 Parkinson's disease without dyskinesia, without mention of fluctuations: Secondary | ICD-10-CM

## 2023-11-21 DIAGNOSIS — Y9301 Activity, walking, marching and hiking: Secondary | ICD-10-CM | POA: Diagnosis not present

## 2023-11-21 DIAGNOSIS — S161XXA Strain of muscle, fascia and tendon at neck level, initial encounter: Secondary | ICD-10-CM | POA: Diagnosis not present

## 2023-11-21 DIAGNOSIS — R519 Headache, unspecified: Secondary | ICD-10-CM | POA: Insufficient documentation

## 2023-11-21 DIAGNOSIS — S199XXA Unspecified injury of neck, initial encounter: Secondary | ICD-10-CM | POA: Diagnosis present

## 2023-11-21 DIAGNOSIS — G20C Parkinsonism, unspecified: Secondary | ICD-10-CM | POA: Diagnosis not present

## 2023-11-21 DIAGNOSIS — W19XXXA Unspecified fall, initial encounter: Secondary | ICD-10-CM

## 2023-11-21 MED ORDER — HYDROCODONE-ACETAMINOPHEN 5-325 MG PO TABS
1.0000 | ORAL_TABLET | Freq: Once | ORAL | Status: AC
  Administered 2023-11-21: 1 via ORAL
  Filled 2023-11-21: qty 1

## 2023-11-21 NOTE — ED Triage Notes (Signed)
 Pt was BIB [facility] from home d/t family/caretaker finding him face down on the floor, pt reports he may have been down for approx. 45 min. States his legs gave out & then he tripped. Does have Hx of Parkinson's, does reports neck pain (c-collar in place), Lt shoulder & Lt wrist pain. A/Ox4, no PIV, 134/70, 96 bpm, 98% RA, CBG 137.

## 2023-11-21 NOTE — ED Provider Notes (Signed)
 [facility] Provider Note   CSN: [id number] Arrival date & time: 11/21/23  1805     History {Add pertinent medical, surgical, social history, OB history to HPI:1} Chief Complaint  Patient presents with   Lee [name] is a 78 y.o. male.  He has a history of Parkinson's disease.  He was just in the ED last week for some right leg weakness and found to have some foraminal stenosis.  His knee will sometimes give way on that side.  Using a walker to ambulate.  Today was walking and his leg gave way fell to the ground.  Could not get back up.  Complaining of pain in his head and neck and left shoulder.  Denies any loss of consciousness.  Found by his caregiver and ambulance was called and brought him here.  Other than pain mentioned above has no other complaints.  No chest pain or abdominal pain no shortness of breath no numbness or weakness.  The history is provided by the patient.  Fall This is a new problem. The current episode started 3 to 5 hours ago. The problem has not changed since onset.Associated symptoms include headaches. Pertinent negatives include no chest pain, no abdominal pain and no shortness of breath. The symptoms are aggravated by bending and twisting. Nothing relieves the symptoms. He has tried nothing for the symptoms. The treatment provided no relief.       Home Medications Prior to Admission medications   Medication Sig Start Date End Date Taking? Authorizing Provider  albuterol  (PROVENTIL  HFA;VENTOLIN  HFA) 108 (90 Base) MCG/ACT inhaler Inhale 2 puffs into the lungs every 4 (four) hours as needed for wheezing or shortness of breath. 01/05/17   Alissa April, MD  Alum Hydroxide-Mag Carbonate 160-105 MG CHEW CHEW 1 TABLET BY MOUTH AS DIRECTED BY YOUR PROVIDER FOR STOMACH UPSET/DYSPEPSIA UP TO THREE TIMES DAILY 10/31/21   [provider]  ascorbic acid (VITAMIN C) 500 MG tablet TAKE ONE TABLET BY MOUTH DAILY TO  HELP WITH IRON STORES AND IMMUNE SYSTEM. PLEASE TAKE EVERY DAY AND ESPECIALLY WHEN YOU TAKE FERROUS SULFATE. 04/17/21   [provider]  BLACK PEPPER-TURMERIC PO Take by mouth daily. Mixes as a powder for back pain (ginger, cinnamon, black pepper, turmeric)    [provider]  Cholecalciferol 50 MCG (2000 UT) TABS     [provider]  CINNAMON PO Mixes as a powder for back pain (ginger, cinnamon, black pepper, turmeric) 02/20/18   [provider]  diclofenac Sodium (VOLTAREN) 1 % GEL     [provider]  FEROSUL 325 (65 Fe) MG tablet Take by mouth. 01/05/21   [provider]  fluticasone (FLONASE) 50 MCG/ACT nasal spray INSTILL 1 SPRAY IN EACH NOSTRIL TWICE A DAY FOR DIZZINESS 05/10/20   [provider]  folic acid (FOLVITE) 1 MG tablet Take 1 mg by mouth daily.    [provider]  Ginger, Zingiber officinalis, (GINGER PO) Mixes as a powder for back pain (ginger, cinnamon, black pepper, turmeric) 02/20/18   [provider]  hydrochlorothiazide (HYDRODIURIL) 25 MG tablet     [provider]  midodrine  (PROAMATINE ) 2.5 MG tablet Take 1 tablet (2.5 mg total) by mouth 3 (three) times daily with meals. 05/03/23   Tat, Von Grumbling, DO  mirtazapine  (REMERON ) 7.5 MG tablet Take 1 tablet (7.5 mg total) by mouth at bedtime. 07/05/21   Penumalli, Vikram R, MD  mometasone -formoterol  (DULERA) 200-5  MCG/ACT AERO Inhale 2 puffs into the lungs in the morning and at bedtime. 05/31/21   Quillian Brunt, MD  Multiple Vitamin [place]on Left) TABS Take by mouth.    [provider]  oxybutynin (DITROPAN-XL) 10 MG 24 hr tablet Take 10 mg by mouth at bedtime.    [provider]  pantoprazole (PROTONIX) 40 MG tablet Take by mouth.    [provider]  vitamin C (ASCORBIC ACID) 500 MG tablet Take by mouth. 01/17/21   [provider]      Allergies    Acyclovir and Enalapril maleate    Review of Systems    Review of Systems  Constitutional:  Negative for fever.  HENT:  Negative for sore throat.   Eyes:  Negative for visual disturbance.  Respiratory:  Negative for shortness of breath.   Cardiovascular:  Negative for chest pain.  Gastrointestinal:  Negative for abdominal pain.  Genitourinary:  Negative for dysuria.  Musculoskeletal:  Positive for gait problem.  Skin:  Negative for rash.  Neurological:  Positive for speech difficulty and headaches.    Physical Exam Updated Vital Signs BP 132/66   Pulse 86   Temp 98 F (36.7 C) (Oral)   Resp 18   Ht 5\' 10"  (1.778 m)   Wt 68 kg   SpO2 98%   BMI 21.52 kg/m  Physical Exam Vitals and nursing note reviewed.  Constitutional:      General: He is not in acute distress.    Appearance: Normal appearance. He is well-developed.  HENT:     Head: Normocephalic and atraumatic.  Eyes:     Conjunctiva/sclera: Conjunctivae normal.  Cardiovascular:     Rate and Rhythm: Normal rate and regular rhythm.     Heart sounds: No murmur heard. Pulmonary:     Effort: Pulmonary effort is normal. No respiratory distress.     Breath sounds: Normal breath sounds.  Abdominal:     Palpations: Abdomen is soft.     Tenderness: There is no abdominal tenderness. There is no guarding or rebound.  Musculoskeletal:        General: Tenderness present. No deformity.     Cervical back: Neck supple.     Comments: He has some tenderness about his left shoulder.  He has some generalized anterior neck pain.  Trachea is midline no obvious bruising or open wounds.  Skin:    General: Skin is warm and dry.     Capillary Refill: Capillary refill takes less than 2 seconds.  Neurological:     General: No focal deficit present.     Mental Status: He is alert.     Cranial Nerves: No cranial nerve deficit.     Sensory: No sensory deficit.     Motor: No weakness.     ED Results / Procedures / Treatments   Labs (all labs ordered are listed, but only abnormal results are  displayed) Labs Reviewed - No data to display  EKG None  Radiology CT HEAD WO CONTRAST Result Date: 11/21/2023 CLINICAL DATA:  Head trauma EXAM: CT HEAD WITHOUT CONTRAST TECHNIQUE: Contiguous axial images were obtained from the base of the skull through the vertex without intravenous contrast. RADIATION DOSE REDUCTION: This exam was performed according to the departmental dose-optimization program which includes automated exposure control, adjustment of the mA and/or kV according to patient size and/or use of iterative reconstruction technique. COMPARISON:  11/14/2023, MRI 11/14/2023 FINDINGS: Brain: No acute territorial infarction, hemorrhage or intracranial mass. The ventricles are nonenlarged.  Mild atrophy Vascular: No hyperdense vessels.  Carotid vascular calcification Skull: Normal. Negative for fracture or focal lesion. Sinuses/Orbits: No acute finding. Other: None IMPRESSION: No CT evidence for acute intracranial abnormality. Mild atrophy. Electronically Signed   By: Esmeralda Hedge M.D.   On: 11/21/2023 19:55   CT CERVICAL SPINE WO CONTRAST Result Date: 11/21/2023 CLINICAL DATA:  Fall EXAM: CT CERVICAL SPINE WITHOUT CONTRAST TECHNIQUE: Multidetector CT imaging of the cervical spine was performed without intravenous contrast. Multiplanar CT image reconstructions were also generated. RADIATION DOSE REDUCTION: This exam was performed according to the departmental dose-optimization program which includes automated exposure control, adjustment of the mA and/or kV according to patient size and/or use of iterative reconstruction technique. COMPARISON:  MRI 05/13/2019 FINDINGS: Alignment: Straightening of the cervical spine. Trace retrolisthesis C3 on C4 and C5 on C6. Facet alignment is within normal limits. Skull base and vertebrae: No acute fracture. No primary bone lesion or focal pathologic process. Soft tissues and spinal canal: No prevertebral fluid or swelling. No visible canal hematoma. Disc levels:  Multilevel degenerative changes. Moderate severe disc space narrowing C3 through C6. Multilevel facet degenerative changes. Bilateral foraminal narrowing C3 through C6, right greater than left and worst at C3-C4. Upper chest: Emphysema Other: None IMPRESSION: 1. Straightening of the cervical spine with multilevel degenerative changes. No acute osseous abnormality. 2. Emphysema. Emphysema (ICD10-J43.9). Electronically Signed   By: Esmeralda Hedge M.D.   On: 11/21/2023 19:53   DG Wrist Complete Left Result Date: 11/21/2023 CLINICAL DATA:  Fall. EXAM: LEFT WRIST - COMPLETE 3+ VIEW COMPARISON:  None Available. FINDINGS: There is no evidence of fracture or dislocation. There is no evidence of arthropathy or other focal bone abnormality. Soft tissues are unremarkable. IMPRESSION: Negative. Electronically Signed   By: Tyron Gallon M.D.   On: 11/21/2023 19:50   DG Shoulder Left Result Date: 11/21/2023 CLINICAL DATA:  Fall with shoulder pain EXAM: LEFT SHOULDER - 2+ VIEW COMPARISON:  None Available. FINDINGS: Moderate AC joint and mild glenohumeral degenerative change. No fracture or malalignment IMPRESSION: Degenerative changes. Electronically Signed   By: Esmeralda Hedge M.D.   On: 11/21/2023 19:48    Procedures Procedures  {Document cardiac monitor, telemetry assessment procedure when appropriate:1}  Medications Ordered in ED Medications - No data to display  ED Course/ Medical Decision Making/ A&P   {   Click here for ABCD2, HEART and other calculatorsREFRESH Note before signing :1}                              Medical Decision Making Amount and/or Complexity of Data Reviewed Radiology: ordered.   This patient complains of ***; this involves an extensive number of treatment Options and is a complaint that carries with it a high risk of complications and morbidity. The differential includes ***  I ordered, reviewed and interpreted labs, which included *** I ordered medication *** and reviewed  PMP when indicated. I ordered imaging studies which included *** and I independently    visualized and interpreted imaging which showed *** Additional history obtained from *** Previous records obtained and reviewed *** I consulted *** and discussed lab and imaging findings and discussed disposition.  Cardiac monitoring reviewed, *** Social determinants considered, *** Critical Interventions: ***  After the interventions stated above, I reevaluated the patient and found *** Admission and further testing considered, ***   {Document critical care time when appropriate:1} {Document review of labs and clinical decision tools ie  heart score, Chads2Vasc2 etc:1}  {Document your independent review of radiology images, and any outside records:1} {Document your discussion with family members, caretakers, and with consultants:1} {Document social determinants of health affecting pt's care:1} {Document your decision making why or why not admission, treatments were needed:1} Final Clinical Impression(s) / ED Diagnoses Final diagnoses:  None    Rx / DC Orders ED Discharge Orders     None

## 2023-11-21 NOTE — ED Notes (Signed)
 Patient ambulated to the hallway with a walker

## 2023-11-21 NOTE — Discharge Instructions (Signed)
 You were seen in the emergency department for evaluation of injuries from a fall.  You had a CAT scan of your head and neck along with x-rays of your left shoulder and wrist that did not show any traumatic findings.  You declined being evaluated for a rehab facility for your unsteadiness.  Please follow-up with your primary care doctor.  Return if any worsening or concerning symptoms

## 2023-11-22 MED ORDER — ALUM & MAG HYDROXIDE-SIMETH 200-200-20 MG/5ML PO SUSP
15.0000 mL | Freq: Once | ORAL | Status: AC
  Administered 2023-11-22: 15 mL via ORAL
  Filled 2023-11-22: qty 30

## 2023-11-22 NOTE — ED Notes (Signed)
 Report given to PTAR patient transferred at this time.

## 2024-01-24 ENCOUNTER — Encounter: Payer: Self-pay | Admitting: Neurology

## 2024-03-06 ENCOUNTER — Other Ambulatory Visit: Payer: Self-pay

## 2024-03-06 DIAGNOSIS — R202 Paresthesia of skin: Secondary | ICD-10-CM

## 2024-03-09 ENCOUNTER — Encounter: Admitting: Neurology

## 2024-03-12 ENCOUNTER — Other Ambulatory Visit: Payer: Self-pay

## 2024-03-12 DIAGNOSIS — R202 Paresthesia of skin: Secondary | ICD-10-CM

## 2024-03-16 ENCOUNTER — Ambulatory Visit (INDEPENDENT_AMBULATORY_CARE_PROVIDER_SITE_OTHER): Admitting: Neurology

## 2024-03-16 DIAGNOSIS — R202 Paresthesia of skin: Secondary | ICD-10-CM

## 2024-03-16 DIAGNOSIS — M5417 Radiculopathy, lumbosacral region: Secondary | ICD-10-CM

## 2024-03-16 NOTE — Procedures (Signed)
 [facility]  [address], [address]  [place], [place] [address] Tel: [phone] Fax: [phone] Test Date:  03/16/2024  Patient: Lee [name] DOB: 1946-05-22 Physician: Venetia Potters, MD  Sex: Male Height: 5' 10 Ref Phys: Reyes Freshwater, MD  ID#: [id number]   Technician:    History: This is a 78 year old male with right leg pain and weakness.  NCV & EMG Findings: Extensive electrodiagnostic evaluation of the right lower limb shows: Right sural and superficial peroneal/fibular sensory responses are within normal limits. Right peroneal/fibular (EDB) motor response shows reduced amplitude (1.67 mV). Right tibial (AH) and peroneal/fibular (TA) motor responses are within normal limits. Right H reflex latency is within normal limits. Patient had very poor activation of all muscles, but most in proximal muscles. As such, there are no motor units seen in right rectus femoris, iliacus, or adductor longus muscles. There are no active denervation changes to suggest an active peripheral process preventing activation. There are chronic motor axon loss changes without active denervation changes seen in lumbosacral paraspinal (L4 level) muscles.  Impression: This is an abnormal study. Findings are difficult to localize given his poor activation, which could be due to pain or a central process. The findings are most consistent with the following: No electrodiagnostic evidence of a large fiber sensorimotor neuropathy. Given poor activation, it is not possible to fully evaluate lumbosacral radiculopathy. Chronic neurogenic changes seen in lumbosacral paraspinal muscles argue for the residuals of an old radiculopathy, though level is not able to be determined on this study. Compared to prior EMG dated 04/03/23, nerve conductions are similar to this prior EMG.   ___________________________ Venetia Potters, MD    Nerve Conduction Studies Motor Nerve Results    Latency Amplitude F-Lat  Segment Distance CV Comment  Site (ms) Norm (mV) Norm (ms)  (cm) (m/s) Norm   Right Fibular (EDB) Motor  Ankle 3.7  < 6.0 *1.67  > 2.5        Bel fib head 11.4 - 1.34 -  Bel fib head-Ankle 34 44  > 40   Pop fossa 13.4 - 1.34 -  Pop fossa-Bel fib head 8.5 43 -   Right Fibular (TA) Motor  Fib head 2.1  < 4.5 3.3  > 3.0        Pop fossa 3.9  < 6.7 3.2 -  Pop fossa-Fib head 9 50  > 40   Right Tibial (AH) Motor  Ankle 3.5  < 6.0 6.1  > 4.0        Knee 12.7 - 4.7 -  Knee-Ankle 45 49  > 40    Sensory Sites    Neg Peak Lat Amplitude (O-P) Segment Distance Velocity Comment  Site (ms) Norm (V) Norm  (cm) (ms)   Right Superficial Fibular Sensory  14 cm-Ankle 2.5  < 4.6 6  > 3 14 cm-Ankle 14    Right Sural Sensory  Calf-Lat mall 2.7  < 4.6 4  > 3 Calf-Lat mall 14     H-Reflex Results    M-Lat H Lat H Neg Amp H-M Lat  Site (ms) (ms) Norm (mV) (ms)  Right Tibial H-Reflex  Pop fossa 5.7 32.0  < 35.0 0.62 26.3   Electromyography   Side Muscle Ins.Act Fibs Fasc Recrt Amp Dur Poly Activation Comment  Right Tib ant Nml Nml Nml Nml Nml Nml Nml Nml N/A  Right Gastroc MH Nml Nml Nml Nml Nml Nml Nml Nml N/A  Right Rectus fem Nml Nml  Nml *None *- *- *- *None Unable to activate  Right Iliacus Nml Nml Nml *None *- *- *- *None Unable to activate  Right Add longus Nml Nml Nml *None *- *- *- *None Unable to activate  Right Lumbar PSP lower Nml Nml Nml *1- *1+ *1+ Nml Nml N/A      Waveforms:  Motor        Sensory      H-Reflex

## 2024-03-26 NOTE — Procedures (Signed)
 [facility]  Modified Barium Swallow  Patient Name: Lee [name] Age: 78 y.o. 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 Swallow efficiency is mildly impaired; no airway invasion occurred with any consistency. Diet modification is not indicated, though patient generally prefers softer consistencies. Recommend considering voice/speech therapy for hypokinetic dysarthria and hypophonia.     Objective narrative, detailing the results of this study:    Oral deficits are c/b:   - repetitive and disorganized lingual motion allow for decreased bolus formation/cohesion and mildly prolonged AP transit. This was most evident with solids and pill consistency. Patient used head extension maneuver to assist with transit.   - decreased oral clearance allowing for a collection of residue after the swallow; independently mitigated by patient via with a double swallow  Pharyngeal deficits are c/b:   - impaired base of tongue retraction allowing for mild pharyngeal residue (liquid residue>solid)   - impaired pharyngeal contraction allowing for unilateral bulging of pharyngeal wall on L during the swallow also contributing to mild pharyngeal residue with liquids    No airway invasion of any consistency despite thorough challenging.    An esophageal sweep was performed in the upright, gravity-assisted position and revealed prompt esophageal clearance of all consistencies tested (solids, liquids and pill).   Recommend continuing regular diet, thin/0 liquids and medications whole.     Prognosis: Good Prognosis Considerations: Family/community  support  Plan:  Nutritional Recommendation:  Regular diet;Thin Liquids/0  Recommended Form of Meds: Whole;As tolerated  Compensatory Swallowing Strategies: Alternate foods and liquids (patient independently completing as needed this date)  Results and recommendations discussed with patient and caregiver Scarlette Long)  Details of Evaluation:  Charting Type: Initial Evaluation Referring Provider: Dr. Deidra Leonor Antonio is a 78 y.o. male presenting for an outpatient MBS at the request of the [place]. An order was sent but no other corresponding information was obtained despite Epic and phone requests. Patient reports preference for soft/easy to chew foods. He endorses that food get occasionally gets stuck with pharyngeal localization; however his reports are very vague and inconsistent. He reports his life alert necklace worsens his swallow function. Caregiver interview (with [place]) was beneficial and she denies concerns with coughing, choking and was unaware of reason for this referral. She confirmed soft diet preference but denies other symptoms of oropharyngeal or esophageal dysphagia.    Subjective: Alert, cooperative and pleasant mood. Accompanied by caregiver.   Current Problem List:  Problem List[1]  History:   Past Medical History:  Diagnosis Date  . COPD (chronic obstructive pulmonary disease)  (*)   . GERD (gastroesophageal reflux disease)    - Parkinsons Disease  - EtoH use Past Surgical History:  Procedure Laterality Date  . Rectal examination under anesthesia w/ cystoscopy    . Tonsillectomy       Objective     Pre-Assessment  Patient Date of Birth verified #1 Patient Identifier: Yes Patient Full Name Verified #2 Patient Identifier: Yes Radiologist/PA: Dr. Corlis Previous Swallow Assess: MBS 2023 OSH: WFL. Esophagram 05/21/23: Mild stricture of the distal esophagus just above the GE junction   and likely including the GE junction. A 13 mm barium tablet was  swallowed without difficulty and demonstrated rapid transit down the   esophagus and into the stomach with no delayed transit at the level   of the mild distal esophageal stricture. 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   Evaluation Oral-Motor Baseline Vocal Quality: Dysphonic ([id number]) Volitional Cough: Strong Volitional Swallow: Within Functional Limits Secretion Management: Within Functional Limits (no drooling) Oral/Motor: Within functional limits   Structural Abnormalities   Small osteophytes at C3-4, 4-5, 5-6 that are non-obstructive     Penetration/Aspiration Scale Thin/0 Administered Via: Spoon;Straw Penetration / Aspiration Scale: 1-Does not enter airway     Nectar/2 Mildly Thick Administered Via: Straw Penetration /  Aspiration Scale: 1-Does not enter airway     Honey/3 Moderately Thick Administered Via: Spoon Penetration / Aspiration Scale: 1-Does not enter airway    Puree/4 Administered Via: Spoon Penetration / Aspiration Scale: 1-Does not enter airway    Solid Administered Via: Spoon Penetration / Aspiration Scale: 1-Does not enter airway    Pill Administered Via: Spoon Penetration / Aspiration Scale: 1-Does not enter airway     Physiologic Components  Oral Impairment Component Function Comments  Lip Closure WFL;MBSImP rating 0-No labial escape    Tongue Control during Bolus Hold WFL;MBSImP rating 0-Cohesive bolus between tongue to palatal seal   Bolus Prep/Mastication Impaired;MBSImP rating 1-Slow prolonged chewing/mashing with complete  re-collection   Bolus Transport/Lingual Motion Impaired;MBSImP rating 3-Repetitive/disorganized tongue motion   Oral Residue Impaired;MBSImP rating 2-Residue collection on oral structures      Pharyngeal Impairment Component Function Comments  Initiation of Pharyngeal Swallow WFL;MBSImP rating 3-Bolus head in pyriforms   Soft Palate Elevation WFL;MBSImP rating 0-No bolus between soft palate (SP)/pharyngeal wall (PW)   Laryngeal Elevation WFL;MBSImP rating 0-Complete superior movement of thyroid cartilage with complete approximation of arytenoids to epiglottic petiole   Anterior Hyoid Movement WFL;MBSImP rating 0-Complete anterior movement   Epiglottic Movement WFL;MBSImp rating 0-Complete inversion   Laryngeal Vestibule Closure WFL;MBSImP rating 0-Complete, no air/contrast in laryngeal vestibule   Pharyngeal Stripping Wave WFL;MBSImP rating 0-Present-complete   Pharyngeal Contraction Impaired;MBSImP rating 2-Unilateral Bulging On L  PES Opening WFL;MBSImP rating 0-Complete distension and complete duration, no obstruction of flow   Tongue Base Retraction Impaired;MBSImP rating 3-Wide column of contrast or air between TB and PW   Pharyngeal Residue Impaired;MBSImP rating 2-Collection of residue within or on pharyngeal structures     Esophageal Impairment Component Function Comments  Esophageal Clearance Upright Position Impaired;MBSImP rating 0-Complete clearance, esophageal coating    Compensatory Techniques Alternate Liquid/Solids: Successful  Plan Information/Education:    Patient Education/Instruction given to patient/caregiver: Yes  Patient/Caregiver goals: not stated    Treatment Duration:  Today's Evaluation/Treatment: 1146 - 1213 Total Time: 27   Charges:   MBS  Corean Kato, SLP 03/26/2024 12:26 PM         [1] There is no problem list on file for this patient.

## 2024-04-02 ENCOUNTER — Ambulatory Visit (HOSPITAL_BASED_OUTPATIENT_CLINIC_OR_DEPARTMENT_OTHER): Attending: Family | Admitting: Physical Therapy

## 2024-04-02 DIAGNOSIS — M6281 Muscle weakness (generalized): Secondary | ICD-10-CM | POA: Insufficient documentation

## 2024-04-02 NOTE — Therapy (Signed)
 Pt arrives with CG.  Subjective uncovers that pt continues to be receiving home therapies.  CG who has mobility issues herself, had difficulty getting pt to and from setting and  appears to not be able to be of assistance for pt here (getting here; dressing etc).  They have decided against progressing with this therapy to keep the home services (PT,OT and sp) and avoid the difficulty they will have participating.  Of Note: pt should be seen by a Neuro clinic for approp care (Parkinsons d)rather than ortho.  Ronal [place]) Thornton Dohrmann MPT 04/02/24 4:06 PM [facility] [address] [place], [place], [address] Phone: [phone]   Fax:  [phone]

## 2024-04-02 NOTE — Therapy (Deleted)
 OUTPATIENT PHYSICAL THERAPY THORACOLUMBAR EVALUATION   Patient Name: Lee [name] MRN: [id number] DOB:12/12/1945, 78 y.o., male Today's Date: 04/02/2024  END OF SESSION:   Past Medical History:  Diagnosis Date   Acid reflux    Alcohol abuse    quit 10 y ago   Arthritis    BPH (benign prostatic hyperplasia)    CAD (coronary artery disease)    Cervical spondylosis without myelopathy 06/02/2015   COPD (chronic obstructive pulmonary disease) (HCC)    Emphysema lung (HCC)    GERD (gastroesophageal reflux disease)    High cholesterol    Hypertension    Parkinson's disease (HCC)    PVD (peripheral vascular disease) (HCC)    Tremor 06/02/2015   Past Surgical History:  Procedure Laterality Date   TONSILLECTOMY     VASECTOMY     Patient Active Problem List   Diagnosis Date Noted   Leukopenia 02/28/2022   Acquired deformities of toe(s), unspecified, unspecified foot 11/27/2021   Asthma 11/27/2021   Cervicalgia 11/27/2021   Corns and callosities 11/27/2021   Disturbance of skin sensation 11/27/2021   Elevated prostate specific antigen (PSA) 11/27/2021   Excessive attrition of teeth 11/27/2021   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   Family history of cancer 11/27/2021   Family history of malignant neoplasm of prostate 11/27/2021   Hyperlipidemia 11/27/2021   Iron deficiency anemia, unspecified 11/27/2021   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   Myalgia 11/27/2021   Nail dystrophy 11/27/2021   Benign neoplasm of other parts of mouth 11/27/2021   Open angle with borderline findings, high risk, left eye 11/27/2021   Other abnormal glucose 11/27/2021   Other reduced mobility 11/27/2021   Other secondary cataract, bilateral 11/27/2021   Pain in right thigh 11/27/2021   Pain in right knee 11/27/2021   Pain in unspecified shoulder 11/27/2021   Persons encountering health services in other specified circumstances 11/27/2021   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/27/2021   Right lower quadrant pain 11/27/2021   Tinnitus 11/27/2021   Trochanteric bursitis, right hip 11/27/2021   Unilateral primary osteoarthritis, right hip 11/27/2021   Unspecified fall, initial encounter 11/27/2021   Vitamin D  deficiency, unspecified 11/27/2021   Weakness 11/27/2021   Constipation 08/28/2021   Chronic obstructive pulmonary disease (HCC) 05/31/2021   Depression 04/13/2021   Parkinson's disease 02/28/2021   Weakness of both legs 02/01/2021   Dysphagia 02/01/2021   Weakness of right leg 01/06/2021   Lumbar radiculopathy 12/30/2020   Prostate cancer (HCC) 06/09/2020   Well adult exam 06/04/2019   BPH (benign prostatic hyperplasia) 06/04/2019   Bilateral carotid bruits 06/04/2019   Meniere disease, 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   Emphysema of lung (HCC) 06/04/2019   Spondylolisthesis at L4-L5 level 08/27/2017   Spinal stenosis in cervical region 08/27/2017   Tremor 06/02/2015   Cervical spondylosis without myelopathy 06/02/2015   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   Coronary arteriosclerosis 07/30/2009   History of colonoscopy 07/30/2009   Neutropenia (HCC) 07/30/2008   HIP PAIN 11/27/2007   Essential hypertension 07/25/2007   Atrial fibrillation (HCC) 07/25/2007   GERD 07/25/2007   ERECTILE DYSFUNCTION 07/25/2007   LOW BACK PAIN 07/25/2007    PCP: ***  REFERRING PROVIDER: ***  REFERRING DIAG: R53.1 (ICD-10-CM) - Weakness   Rationale for Evaluation and Treatment: Rehabilitation  THERAPY DIAG:  No diagnosis found.  ONSET DATE: ***  SUBJECTIVE:  SUBJECTIVE STATEMENT: Pt reports use of walker and cane at home. Has had 3 falls.  Lives alone. Has CG that helps with appts,  transportation  PERTINENT HISTORY:  Parkinsons PVD Spondylolisthesis L4-5 Myalgia   PAIN:  Are you having pain? Yes: NPRS scale: *** Pain location: R knee Pain description: *** Aggravating factors: *** Relieving factors: ***  PRECAUTIONS: Fall and Other: Parkinsons D  RED FLAGS: None   WEIGHT BEARING RESTRICTIONS: No  FALLS:  Has patient fallen in last 6 months? Yes. Number of falls 3  LIVING ENVIRONMENT: Lives with: lives with their family and lives alone Lives in: House/apartment Stairs: No Has following equipment at home: Single point cane, Environmental consultant - 4 wheeled, shower chair, and Grab bars  OCCUPATION: retired  PLOF: Needs assistance with ADLs, Needs assistance with homemaking, Needs assistance with gait, and Needs assistance with transfers  PATIENT GOALS: use my right leg  NEXT MD VISIT: as needed  OBJECTIVE:  Note: Objective measures were completed at Evaluation unless otherwise noted.  DIAGNOSTIC FINDINGS:  MRI lumbar spine 4/25 IMPRESSION: 1. At L4-L5, grade 1 anterolisthesis with severe facet arthropathy, severe right and moderate to severe left subarticular recess stenosis, moderate to severe canal stenosis, and moderate to severe right foraminal stenosis. 2. At L3-L4, far lateral left foraminal disc protrusion with mild left foraminal stenosis.  PATIENT SURVEYS:  LEFS  COGNITION: Overall cognitive status: Parkinsons D.  Undetermined.  Speech is affected     SENSATION: {sensation:27233}  MUSCLE LENGTH: Hamstrings: Right *** deg; Left *** deg   POSTURE: {posture:25561}  PALPATION: ***  LUMBAR ROM:   AROM eval  Flexion   Extension   Right lateral flexion   Left lateral flexion   Right rotation   Left rotation    (Blank rows = not tested)  LOWER EXTREMITY ROM:     {AROM/PROM:27142}  Right eval Left eval  Hip flexion    Hip extension    Hip abduction    Hip adduction    Hip internal rotation    Hip external rotation     Knee flexion    Knee extension    Ankle dorsiflexion    Ankle plantarflexion    Ankle inversion    Ankle eversion     (Blank rows = not tested)  LOWER EXTREMITY MMT:    MMT Right eval Left eval  Hip flexion    Hip extension    Hip abduction    Hip adduction    Hip internal rotation    Hip external rotation    Knee flexion    Knee extension    Ankle dorsiflexion    Ankle plantarflexion    Ankle inversion    Ankle eversion     (Blank rows = not tested)   FUNCTIONAL TESTS:  {Functional tests:24029}  GAIT: Distance walked: *** Assistive device utilized: {Assistive devices:23999} Level of assistance: {Levels of assistance:24026} Comments: ***  TREATMENT  Eval Self care:Posture and Psychologist, prison and probation services  PATIENT EDUCATION:  Education details: Discussed eval findings, rehab rationale, aquatic program progression/POC and pools in area. Patient is in agreement  Person educated: Patient Education method: Explanation Education comprehension: verbalized understanding  HOME EXERCISE PROGRAM: Has land based exercises from recent [facility]  ASSESSMENT:  CLINICAL IMPRESSION: Patient is a 78 y.o. m who was seen today for physical therapy evaluation and treatment for weakness. Pt has extensive med hx which include Parkinson's disease; spondylolisthesis L4-5, falls, PVD; COPD.  He has a CG whom assists him 3 days a week for 2 hours.  He has recently been Dc'ed from HHPT and requests aquatic therapy.  OBJECTIVE IMPAIRMENTS: {opptimpairments:25111}.   ACTIVITY LIMITATIONS: {activitylimitations:27494}  PARTICIPATION LIMITATIONS: {participationrestrictions:25113}  PERSONAL FACTORS: {Personal factors:25162} are also affecting patient's functional outcome.   REHAB POTENTIAL: {rehabpotential:25112}  CLINICAL DECISION MAKING: {clinical decision  making:25114}  EVALUATION COMPLEXITY: {Evaluation complexity:25115}   GOALS: Goals reviewed with patient? {yes/no:20286}  SHORT TERM GOALS: Target date: ***  *** Baseline: Goal status: INITIAL  2.  *** Baseline:  Goal status: INITIAL  3.  *** Baseline:  Goal status: INITIAL  4.  *** Baseline:  Goal status: INITIAL  5.  *** Baseline:  Goal status: INITIAL  6.  *** Baseline:  Goal status: INITIAL  LONG TERM GOALS: Target date: ***  *** Baseline:  Goal status: INITIAL  2.  *** Baseline:  Goal status: INITIAL  3.  *** Baseline:  Goal status: INITIAL  4.  *** Baseline:  Goal status: INITIAL  5.  *** Baseline:  Goal status: INITIAL  6.  *** Baseline:  Goal status: INITIAL  PLAN:  PT FREQUENCY: {rehab frequency:25116}  PT DURATION: {rehab duration:25117}  PLANNED INTERVENTIONS: {rehab planned interventions:25118::97110-Therapeutic exercises,97530- Therapeutic [phone]- Neuromuscular re-education,97535- Self Rjmz,02859- Manual therapy}.  PLAN FOR NEXT SESSION: PIERRETTE Shuck [place]) Delfino Friesen MPT 04/02/24 8:19 AM [facility] [address] [place], [place], [address] Phone: [phone]   Fax:  [phone]

## 2024-04-07 NOTE — ED Provider Notes (Signed)
 [facility] HEALTH [facility]  ED Provider Note  Lee [name] 78 y.o. male DOB: 1946-04-16 MRN: [id number] History   Chief Complaint  Patient presents with  . Slurred Speech    BIB by EMS from [place] where he was at an eye doctors appointment. Slurred speech, dysphagia and right sided weakness x2 weeks.   78 year old male presenting to us  for progressive slurred speech and weakness.  Patient states for the past couple weeks he has noticed a decline in his speech and strength in his right thigh.  He is accompanied by caregiver here who says his speech has been getting worse for quite some time due to his underlying Parkinson's but they were concerned because it has continued to worsen.  No recent trauma or or significant acute decline in his symptoms that patient nor caretaker are aware of.  It should be noted that patient does have moderate dysarthria and he is somewhat difficult to understand at times send according to the caregiver this is not acute.  He denies history of known stroke/TIA, no history of ACS or cardiac stents.  Denies anticoagulation use.  Did inform me that he recently stopped taking his carbidopa  without recommendation from his physician because he feels like every test/procedure that is performed on him makes him worse.   Slurred Speech Presenting symptoms: weakness        Past Medical History:  Diagnosis Date  . COPD (chronic obstructive pulmonary disease)  (*)   . GERD (gastroesophageal reflux disease)     Past Surgical History:  Procedure Laterality Date  . Rectal examination under anesthesia w/ cystoscopy    . Tonsillectomy      Social History   Substance and Sexual Activity  Alcohol Use Never   Tobacco Use History[1] E-Cigarettes  . Vaping Use Never User   . Start Date    . Cartridges/Day    . Quit Date     Social History   Substance and Sexual Activity  Drug Use Never         Allergies[2]  Home Medications   ACETAZOLAMIDE  (DIAMOX) 250 MG TABLET    Take 250 mg by mouth 3 (three) times a day.   ALBUTEROL  SULFATE HFA (PROVENTIL ,VENTOLIN ,PROAIR ) 108 (90 BASE) MCG/ACT INHALER    Inhale 2 puffs into the lungs every 6 (six) hours as needed.   BUDESONIDE-FORMOTEROL  (SYMBICORT) 80-4.5 MCG/ACTUATION INHALER    Inhale 2 puffs into the lungs 2 (two) times daily.   CHOLECALCIFEROL 25 MCG (1000 UT) TABLET    Take 1,000 Units by mouth daily.   FAMOTIDINE (PEPCID) 20 MG TABLET    Take one tablet (20 mg dose) by mouth 2 (two) times daily for 14 days.   FOLIC ACID 1 MG TABLET    Take 1 mg by mouth daily.   GINGER ROOT POWD    by Misc.(Non-Drug; Combo Route) route.   IBUPROFEN (ADVIL,MOTRIN) 200 MG TABLET    Take 200-400 mg by mouth every 6 (six) hours as needed.   MULTIPLE VITAMIN (QUINTABS) TABS    Take 1 tablet by mouth daily.   PANTOPRAZOLE SODIUM (PROTONIX) 20 MG TABLET    Take one tablet (20 mg dose) by mouth 2 (two) times daily for 14 days.   RANITIDINE  (ZANTAC ) 150 MG TABLET    Take 150 mg by mouth daily.   TADALAFIL (CIALIS) 10 MG TABLET    Take 10 mg by mouth daily as needed for Erectile Dysfunction.   TIOTROPIUM BROMIDE (SPIRIVA RESPIMAT) 2.5 MCG/ACTUATION  INHALER    Inhale 1 puff into the lungs daily.   TURMERIC POWD    Take 5 mLs by mouth daily.    Primary Survey  Primary Survey  Review of Systems   Review of Systems  Constitutional: Negative.   HENT: Negative.    Respiratory: Negative.    Cardiovascular: Negative.   Gastrointestinal: Negative.   Genitourinary: Negative.   Neurological:  Positive for speech difficulty and weakness.    Physical Exam   ED Triage Vitals  BP 04/07/24 1630 129/67  Heart Rate 04/07/24 1626 68  Resp 04/07/24 1626 15  SpO2 04/07/24 1627 100 %  Temp 04/07/24 1630 97.8 F (36.6 C)    Physical Exam  Constitutional: He does not appear distressed and does not appear ill.  HENT:  Head: Normocephalic and atraumatic.  Right Ear: Normal external ear.  Left Ear: Normal external  ear.  Eyes: EOM are intact. Pupils are equal, round, and reactive to light.  Neck: Normal range of motion. Neck supple.  Cardiovascular: Normal rate, regular rhythm, normal heart sounds and intact distal pulses.  Pulmonary/Chest: Respiratory effort normal and breath sounds normal.  Abdominal: Soft. There is no abdominal tenderness. There is no guarding. Abdomen not distended.  Musculoskeletal: No obvious deformity noted to extremities.     Cervical back: Normal range of motion and neck supple. no edema.  Neurological: He is alert and oriented to person, place, and time. Sensation intact to light touch, bilateral upper and lower extremities.  Patient with moderate intensity dysarthria, unable to lift right leg against gravity but is able to lift left leg against gravity, denies significant sensation or strength changes otherwise, no significant dysmetria noted  Skin: Skin is warm. Skin is dry.     ED Course   Lab results:   CBC AND DIFFERENTIAL - Abnormal      Result Value   WBC 3.1 (*)    RBC 4.40 (*)    HGB 14.6     HCT 43.4     MCV 98.6 (*)    MCH 33.2 (*)    MCHC 33.6     Plt Ct 225     RDW SD 45.1     MPV 9.5     NRBC% 0.0     Absolute NRBC Count 0.00     NEUTROPHIL % 50.0     LYMPHOCYTE % 26.4     MONOCYTE % 15.6     Eosinophil % 7.0     BASOPHIL % 1.0     IG% 0.0     ABSOLUTE NEUTROPHIL COUNT 1.57     ABSOLUTE LYMPHOCYTE COUNT 0.83 (*)    Absolute Monocyte Count 0.49     Absolute Eosinophil Count 0.22     Absolute Basophil Count 0.03     Absolute Immature Granulocyte Count 0.00    COMPREHENSIVE METABOLIC PANEL - Normal   Na 139     Potassium 3.8     Cl 102     CO2 28     AGAP 9     Glucose 78     BUN 12     Creatinine 0.81     Ca 9.5     ALK PHOS 83     T Bili 0.5     Total Protein 7.5     Alb 4.3     GLOBULIN 3.2     ALBUMIN/GLOBULIN RATIO 1.3     BUN/CREAT RATIO 14.8     ALT 11  AST 21     eGFR 91     Comment: Normal GFR (glomerular  filtration rate) > 60 mL/min/1.73 meters squared, < 60 may include impaired kidney function. Calculation based on the Chronic Kidney Disease Epidemiology Collaboration (CK-EPI)equation refit without adjustment for race.  PROGNOSTIC GEN5 CARDIAC TROPONIN T - Normal   Prognostic Gen5 Cardiac Troponin T 21     Comment: An elevated Troponin indicates myocardial damage. Elevated troponin may also be due to pulmonary emboli, aortic dissection, heart failure, trauma, toxins and ischemia in the setting of critical illness.   LIGHT BLUE TOP  LAVENDER TOP  MINT GREEN-TOP TUBE (PST GEL/LI HEP)  GOLD SST    Imaging:   CT HEAD WO CONTRAST   Narrative:    INDICATION:    Other 2 weeks progress slurred speech, hx parkinson,  COMPARISON:   None.  TECHNIQUE:    Multiple axial images obtained from the skull base to the vertex without IV contrast  were obtained on  04/07/2024 6:03 PM  FINDINGS:  No evidence of hydrocephalus. No intracranial mass, mass effect or midline shift. No acute infarction evident. No intracranial hemorrhage. Paranasal sinuses: Clear. Mastoid air cells: Clear. Calvarium: Intact.    Impression:    IMPRESSION: No acute intracranial abnormality.  Electronically Signed by: Rodgers Closs on 04/07/2024 6:42 PM  CT ANGIO HEAD NECK   Narrative:    CTA Head and Neck:  INDICATION: Other 2 week progressive slurred speech and R side weakness  TECHNIQUE:  Thin axial scans were performed from the top of the aortic arch up through the neck and head after bolus injection of 80 mL mL of Isovue-370. 3-D MIP images were generated on an independent workstation and archived PACS. Radiation dose reduction was utilized (automated exposure control, mA or kV adjustment based on patient size, or iterative image reconstruction).  COMPARISON: None  FINDINGS: CTA NECK: #  Aortic arch and brachiocephalic vessels: No significant abnormality #  Right vertebral: Normal #  Left vertebral: Mild  stenosis at the origin. Otherwise patent #  Right carotid artery: Normal. #  Left carotid artery: Minimal calcific plaque at the bulb. No stenosis..  CTA HEAD: #  Carotid siphons: Patent #  Anterior cerebral arteries: Patent #  Middle cerebral arteries: Patent #  Anterior communicating artery: Patent #   #  Posterior cerebral arteries: Patent #  Basilar artery: Patent #  Superior cerebellar arteries: Patent #  Vertebral arteries: Patent #  PICA: Patent. Intradural origin. #   #  Aneurysm/AVM: None #  Dural venous sinuses: Patent #  Other: Mild emphysema. No suspicious nodule or mass. Moderate multilevel disc degeneration throughout the cervical spine.    Impression:    IMPRESSION: CTA NECK: 1.  No carotid stenosis or occlusion. Mild stenosis at the origin of the LEFT vertebral artery. 2.  Moderate multilevel degenerative changes throughout the cervical spine.  CTA HEAD: No large vessel occlusion. Vasculature within normal limits.     Degree of stenosis is determined using NASCET measurement technique: Severe: 70-99% Moderate: 50-69%. Mild: Less than 50%.  Electronically Signed by: Dorn Edwin on 04/07/2024 6:43 PM     ECG: ECG Results          ECG 12 lead (In process)  Result time 04/07/24 17:04:46    In process             Narrative:   Diagnosis Class Abnormal Acquisition Device D3K Ventricular Rate 67 Atrial Rate 67 P-R Interval 230 QRS Duration 112  Q-T Interval 408 QTC Calculation(Bazett) 431 Calculated P Axis 80 Calculated R Axis 82 Calculated T Axis 81  Diagnosis Sinus rhythm with sinus arrhythmia with 1st degree AV block Right bundle branch block Abnormal ECG When compared with ECG of 27-May-2019 10:36, PR interval has increased Right bundle branch block is now present                                        NIH Stroke Scale Score Level of Consciousness (1a.): Alert, keenly responsive LOC Questions (1b.): Answers  both questions correctly LOC Commands (1c.): Performs both tasks correctly Best Gaze (2.): Normal Visual (3.): No visual loss Facial Palsy (4.): Minor paralysis Motor Arm, Left (5a.): No drift Motor Arm, Right (5b.): No drift Motor Leg, Left (6a.): Some effort against gravity Motor Leg, Right (6b.): No effort against gravity Limb Ataxia (7.): Absent Sensory (8.): Normal, no sensory loss Best Language (9.): No aphasia Dysarthria (10.): Mild-to-moderate dysarthria, patient slurs at least some words and, at worst, can be understood with some difficulty Extinction and Inattention (11.) (Formerly Neglect): No abnormality NIH Total: 7                                               Pre-Sedation Procedures  ED Course as of 04/07/24 1904  Aloha Lamer Autry's Documentation  Tue Apr 07, 2024  [id number] 78 year old male presented to us  for progressive weakness and dysarthria.  H&P as above, nontoxic-appearing male with relatively unremarkable vital signs.  Patient is describing a slow decline in his symptoms with known Parkinson's however did opt to obtain basic labs along with screening EKG and troponin and CT head with CTA head/neck.  He does score an NIH of 7 however discussing this with his caregiver and him, it seems like this is close to his baseline and not an acute change.  1901 Work up today without acute stroke findings. Basic labs relatively unremarkable as well, VSS throughout the visit. Discussed with patient and caretaker recommendation for close PCP follow up and discussion about usefulness of MRI. We also discussed strict return precautions to which patient and caregiver expressed understanding prior to discharge.   Medical Decision Making Amount and/or Complexity of Data Reviewed Labs: ordered. Radiology: ordered. ECG/medicine tests: ordered.  Risk Prescription drug management.          Provider Communication  New Prescriptions   No  medications on file    Modified Medications   No medications on file    Discontinued Medications   No medications on file    Clinical Impression Final diagnoses:  Parkinson's disease, unspecified whether dyskinesia present, unspecified whether manifestations fluctuate (*)  Weakness  Difficulty with speech    ED Disposition     ED Disposition  Discharge   Condition  Stable   Comment  --                 Follow-up Information     Schedule an appointment as soon as possible for a visit  with [facility].   Contact information: [address] [facility] [place] [address] [phone]                  Electronically signed by:       [1] Social History  Tobacco Use  Smoking Status Former  Smokeless Tobacco Never  [2] Allergies Allergen Reactions  . Acyclovir Swelling  . Enalapril Unknown    enalapril  . Enalapril Maleate Other    Reaction:  Unknown   Aloha Theo Furth, MD 04/07/24 1904

## 2024-06-16 ENCOUNTER — Observation Stay (HOSPITAL_BASED_OUTPATIENT_CLINIC_OR_DEPARTMENT_OTHER)
Admission: EM | Admit: 2024-06-16 | Discharge: 2024-06-19 | Disposition: A | Discharge status: Skilled Nursing Facility | Source: Ambulatory Visit | Attending: Emergency Medicine | Admitting: Emergency Medicine

## 2024-06-16 ENCOUNTER — Other Ambulatory Visit: Payer: Self-pay

## 2024-06-16 ENCOUNTER — Encounter (HOSPITAL_BASED_OUTPATIENT_CLINIC_OR_DEPARTMENT_OTHER): Payer: Self-pay

## 2024-06-16 ENCOUNTER — Emergency Department (HOSPITAL_BASED_OUTPATIENT_CLINIC_OR_DEPARTMENT_OTHER)

## 2024-06-16 DIAGNOSIS — G9341 Metabolic encephalopathy: Principal | ICD-10-CM | POA: Diagnosis present

## 2024-06-16 DIAGNOSIS — G20C Parkinsonism, unspecified: Secondary | ICD-10-CM | POA: Diagnosis not present

## 2024-06-16 DIAGNOSIS — J449 Chronic obstructive pulmonary disease, unspecified: Secondary | ICD-10-CM | POA: Insufficient documentation

## 2024-06-16 DIAGNOSIS — I739 Peripheral vascular disease, unspecified: Secondary | ICD-10-CM | POA: Insufficient documentation

## 2024-06-16 DIAGNOSIS — R4182 Altered mental status, unspecified: Secondary | ICD-10-CM

## 2024-06-16 DIAGNOSIS — G20A1 Parkinson's disease without dyskinesia, without mention of fluctuations: Secondary | ICD-10-CM | POA: Diagnosis present

## 2024-06-16 DIAGNOSIS — R5381 Other malaise: Secondary | ICD-10-CM

## 2024-06-16 DIAGNOSIS — I2510 Atherosclerotic heart disease of native coronary artery without angina pectoris: Secondary | ICD-10-CM | POA: Diagnosis not present

## 2024-06-16 DIAGNOSIS — I10 Essential (primary) hypertension: Secondary | ICD-10-CM | POA: Diagnosis present

## 2024-06-16 DIAGNOSIS — R4781 Slurred speech: Secondary | ICD-10-CM | POA: Diagnosis present

## 2024-06-16 DIAGNOSIS — N3000 Acute cystitis without hematuria: Principal | ICD-10-CM

## 2024-06-16 DIAGNOSIS — M545 Low back pain, unspecified: Secondary | ICD-10-CM | POA: Insufficient documentation

## 2024-06-16 DIAGNOSIS — K219 Gastro-esophageal reflux disease without esophagitis: Secondary | ICD-10-CM | POA: Diagnosis present

## 2024-06-16 DIAGNOSIS — G8929 Other chronic pain: Secondary | ICD-10-CM | POA: Diagnosis not present

## 2024-06-16 DIAGNOSIS — R2990 Unspecified symptoms and signs involving the nervous system: Secondary | ICD-10-CM

## 2024-06-16 LAB — URINALYSIS, ROUTINE W REFLEX MICROSCOPIC
Bilirubin Urine: NEGATIVE
Glucose, UA: NEGATIVE mg/dL
Hgb urine dipstick: NEGATIVE
Ketones, ur: NEGATIVE mg/dL
Nitrite: POSITIVE — AB
Specific Gravity, Urine: 1.03 (ref 1.005–1.030)
pH: 6.5 (ref 5.0–8.0)

## 2024-06-16 LAB — COMPREHENSIVE METABOLIC PANEL WITH GFR
ALT: 7 U/L (ref 0–44)
AST: 19 U/L (ref 15–41)
Albumin: 4 g/dL (ref 3.5–5.0)
Alkaline Phosphatase: 74 U/L (ref 38–126)
Anion gap: 10 (ref 5–15)
BUN: 14 mg/dL (ref 8–23)
CO2: 26 mmol/L (ref 22–32)
Calcium: 9.8 mg/dL (ref 8.9–10.3)
Chloride: 103 mmol/L (ref 98–111)
Creatinine, Ser: 0.92 mg/dL (ref 0.61–1.24)
GFR, Estimated: >60 mL/min (ref >60)
Glucose, Bld: 90 mg/dL (ref 70–99)
Potassium: 3.6 mmol/L (ref 3.5–5.1)
Sodium: 139 mmol/L (ref 135–145)
Total Bilirubin: 0.4 mg/dL (ref 0.0–1.2)
Total Protein: 7.1 g/dL (ref 6.5–8.1)

## 2024-06-16 LAB — PROTIME-INR
INR: 1 (ref 0.8–1.2)
Prothrombin Time: 13.7 s (ref 11.4–15.2)

## 2024-06-16 LAB — URINE DRUG SCREEN
Amphetamines: POSITIVE — AB
Barbiturates: NEGATIVE
Benzodiazepines: NEGATIVE
Cocaine: NEGATIVE
Fentanyl: NEGATIVE
Methadone Scn, Ur: NEGATIVE
Opiates: NEGATIVE
Tetrahydrocannabinol: NEGATIVE

## 2024-06-16 LAB — URINALYSIS, W/ REFLEX TO CULTURE (INFECTION SUSPECTED)
Bilirubin Urine: NEGATIVE
Glucose, UA: NEGATIVE mg/dL
Hgb urine dipstick: NEGATIVE
Ketones, ur: NEGATIVE mg/dL
Nitrite: POSITIVE — AB
Specific Gravity, Urine: 1.03 (ref 1.005–1.030)
pH: 6.5 (ref 5.0–8.0)

## 2024-06-16 LAB — DIFFERENTIAL
Abs Immature Granulocytes: 0 K/uL (ref 0.00–0.07)
Basophils Absolute: 0 K/uL (ref 0.0–0.1)
Basophils Relative: 1 %
Eosinophils Absolute: 0.2 K/uL (ref 0.0–0.5)
Eosinophils Relative: 5 %
Immature Granulocytes: 0 %
Lymphocytes Relative: 27 %
Lymphs Abs: 0.9 K/uL (ref 0.7–4.0)
Monocytes Absolute: 0.4 K/uL (ref 0.1–1.0)
Monocytes Relative: 12 %
Neutro Abs: 1.8 K/uL (ref 1.7–7.7)
Neutrophils Relative %: 55 %

## 2024-06-16 LAB — APTT: aPTT: 31 s (ref 24–36)

## 2024-06-16 LAB — CBC
HCT: 40.7 % (ref 39.0–52.0)
HCT: 40.6 % (ref 39.0–52.0)
Hemoglobin: 13.8 g/dL (ref 13.0–17.0)
Hemoglobin: 13.7 g/dL (ref 13.0–17.0)
MCH: 33.6 pg (ref 26.0–34.0)
MCH: 33.4 pg (ref 26.0–34.0)
MCHC: 33.9 g/dL (ref 30.0–36.0)
MCHC: 33.7 g/dL (ref 30.0–36.0)
MCV: 99 fL (ref 80.0–100.0)
MCV: 99 fL (ref 80.0–100.0)
Platelets: 195 K/uL (ref 150–400)
Platelets: 200 K/uL (ref 150–400)
RBC: 4.11 MIL/uL — ABNORMAL LOW (ref 4.22–5.81)
RBC: 4.1 MIL/uL — ABNORMAL LOW (ref 4.22–5.81)
RDW: 12.1 % (ref 11.5–15.5)
RDW: 12.2 % (ref 11.5–15.5)
WBC: 3.3 K/uL — ABNORMAL LOW (ref 4.0–10.5)
WBC: 3.2 K/uL — ABNORMAL LOW (ref 4.0–10.5)
nRBC: 0 % (ref 0.0–0.2)
nRBC: 0 % (ref 0.0–0.2)

## 2024-06-16 MED ORDER — ENOXAPARIN SODIUM 40 MG/0.4ML IJ SOSY
40.0000 mg | PREFILLED_SYRINGE | INTRAMUSCULAR | Status: DC
  Administered 2024-06-17 – 2024-06-19 (×3): 40 mg via SUBCUTANEOUS
  Filled 2024-06-16 (×3): qty 0.4

## 2024-06-16 MED ORDER — PROCHLORPERAZINE EDISYLATE 10 MG/2ML IJ SOLN: 5.0000 mg | Freq: Four times a day (QID) | INTRAMUSCULAR | Status: DC | PRN

## 2024-06-16 MED ORDER — ACETAMINOPHEN 325 MG PO TABS
650.0000 mg | ORAL_TABLET | Freq: Once | ORAL | Status: AC
  Administered 2024-06-16: 650 mg via ORAL
  Filled 2024-06-16: qty 2

## 2024-06-16 MED ORDER — SODIUM CHLORIDE 0.9 % IV SOLN: INTRAVENOUS | Status: DC

## 2024-06-16 MED ORDER — POLYETHYLENE GLYCOL 3350 17 G PO PACK
17.0000 g | PACK | Freq: Every day | ORAL | Status: DC | PRN
  Filled 2024-06-16: qty 1

## 2024-06-16 MED ORDER — MELATONIN 5 MG PO TABS: 5.0000 mg | ORAL_TABLET | Freq: Every evening | ORAL | Status: DC | PRN

## 2024-06-16 MED ORDER — SODIUM CHLORIDE 0.9 % IV SOLN
1.0000 g | INTRAVENOUS | Status: AC
  Administered 2024-06-17 – 2024-06-18 (×2): 1 g via INTRAVENOUS
  Filled 2024-06-16 (×2): qty 10

## 2024-06-16 MED ORDER — SODIUM CHLORIDE 0.9 % IV SOLN
1.0000 g | Freq: Once | INTRAVENOUS | Status: AC
  Administered 2024-06-16: 1 g via INTRAVENOUS
  Filled 2024-06-16: qty 10

## 2024-06-16 MED ORDER — ACETAMINOPHEN 500 MG PO TABS
500.0000 mg | ORAL_TABLET | Freq: Four times a day (QID) | ORAL | Status: DC | PRN
  Administered 2024-06-17: 500 mg via ORAL
  Filled 2024-06-16 (×2): qty 1

## 2024-06-16 NOTE — ED Triage Notes (Signed)
 Pt sent here from the [place] [facility] to r/o stroke. Pt reports some worsening difficulty swallowing saliva x3 weeks, worsening speech changes/slurred speech x2 weeks, and some R leg weakness x3 weeks. Hx Parkinson's and moderate dysarthria baseline, reports he recently stopped taking Carbidopa  because he feels it does not help him.

## 2024-06-16 NOTE — H&P (Incomplete)
 History and Physical  Lee [name] FMW:[id number] DOB: March 25, 1946 DOA: 06/16/2024  Referring physician: Accepted by Dr. Dennise [facility], Hospitalist service.  PCP: Daye, Deneda T, FNP  Outpatient Specialists: Neurology. Patient coming from: Home, sent from the [place] urgent care to rule out stroke.  Chief Complaint: Right lower extremity weakness and pain, and slurred speech.  HPI: Lee [name] is a 78 y.o. male with medical history significant for Parkinson's disease stopped taking his Parkinson's medication 2 to 3 months ago, chronic back pain, receives injections to his spine, hypertension, hyperlipidemia, peripheral vascular disease, coronary artery disease, COPD, GERD, who presented to [facility] ED send from [place] urgent care to rule out stroke.  The patient endorses worsening slurred speech for the past 2 weeks and today the slurred speech was much worse to the point where his caretaker could not understand the words he was speaking.  Also endorses right lower extremity weakness and pain from his back all the way down to his right leg.  The patient had a CT head which revealed no acute intracranial abnormality.  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  Moderate bilateral foraminal stenosis at L3-L4 due to disc bulge, intervertebral spurring and facet arthropathy.  Degenerative findings including 9 mm degenerative anterolisthesis at L4-5 and 3 mm degenerative breakthrough listhesis at L1-L2, L2-L3, L3-L4.  UA returned positive for pyuria.  The patient was started on Rocephin in the ER.  Urine culture is pending.  Due to concern for possible stroke, [facility] was asked to admit.  The patient was accepted by Dr. Dennise and transferred to [facility] telemetry  unit as observation status.    MRI brain was negative for acute intracranial abnormality.  The patient continues to have limited active range of motion and pain in his right lower extremity, likely from his chronic back pain, L4-L5 foraminal stenosis.  Analgesics, PT OT to evaluate, and fall precautions.  ED Course: Temperature 99.4.  BP 120/73, pulse 63, respiration rate 15, O2 saturation 100% on room air.  Review of Systems: Review of systems as noted in the HPI. All other systems reviewed and are negative.   Past Medical History:  Diagnosis Date   Acid reflux    Alcohol abuse    quit 10 y ago   Arthritis    BPH (benign prostatic hyperplasia)    CAD (coronary artery disease)    Cervical spondylosis without myelopathy 06/02/2015   COPD (chronic obstructive pulmonary disease) (HCC)    Emphysema lung (HCC)    GERD (gastroesophageal reflux disease)    High cholesterol    Hypertension    Parkinson's disease (HCC)    PVD (peripheral vascular disease)    Tremor 06/02/2015   Past Surgical History:  Procedure Laterality Date   TONSILLECTOMY     VASECTOMY      Social History:  reports that he quit smoking about 19 years ago. His smoking use included cigarettes. He has never used smokeless tobacco. He reports that he does not drink alcohol and does not use drugs.   Allergies  Allergen Reactions   Acyclovir Swelling   Enalapril Maleate Other (See Comments)    Reaction:  Unknown     Family History  Problem Relation Age of Onset   Irregular heart beat Mother    Irregular heart beat Father  Tremor Father    Cancer Sister    Cancer Brother 45       prostate ca   Liver disease Neg Hx    Esophageal cancer Neg Hx    Colon cancer Neg Hx       Prior to Admission medications   Medication Sig Start Date End Date Taking? Authorizing Provider  albuterol  (PROVENTIL  HFA;VENTOLIN  HFA) 108 (90 Base) MCG/ACT inhaler Inhale 2 puffs into the lungs every 4 (four) hours as needed for  wheezing or shortness of breath. 01/05/17   Raford Lenis, MD  Alum Hydroxide-Mag Carbonate 160-105 MG CHEW CHEW 1 TABLET BY MOUTH AS DIRECTED BY YOUR PROVIDER FOR STOMACH UPSET/DYSPEPSIA UP TO THREE TIMES DAILY 10/31/21   [provider]  ascorbic acid (VITAMIN C) 500 MG tablet TAKE ONE TABLET BY MOUTH DAILY TO HELP WITH IRON STORES AND IMMUNE SYSTEM. PLEASE TAKE EVERY DAY AND ESPECIALLY WHEN YOU TAKE FERROUS SULFATE. 04/17/21   [provider]  BLACK PEPPER-TURMERIC PO Take by mouth daily. Mixes as a powder for back pain (ginger, cinnamon, black pepper, turmeric)    [provider]  Cholecalciferol 50 MCG (2000 UT) TABS     [provider]  CINNAMON PO Mixes as a powder for back pain (ginger, cinnamon, black pepper, turmeric) 02/20/18   [provider]  diclofenac Sodium (VOLTAREN) 1 % GEL     [provider]  FEROSUL 325 (65 Fe) MG tablet Take by mouth. 01/05/21   [provider]  fluticasone (FLONASE) 50 MCG/ACT nasal spray INSTILL 1 SPRAY IN EACH NOSTRIL TWICE A DAY FOR DIZZINESS 05/10/20   [provider]  folic acid (FOLVITE) 1 MG tablet Take 1 mg by mouth daily.    [provider]  Ginger, Zingiber officinalis, (GINGER PO) Mixes as a powder for back pain (ginger, cinnamon, black pepper, turmeric) 02/20/18   [provider]  hydrochlorothiazide (HYDRODIURIL) 25 MG tablet     [provider]  midodrine  (PROAMATINE ) 2.5 MG tablet Take 1 tablet (2.5 mg total) by mouth 3 (three) times daily with meals. 05/03/23   Tat, Asberry RAMAN, DO  mirtazapine  (REMERON ) 7.5 MG tablet Take 1 tablet (7.5 mg total) by mouth at bedtime. 07/05/21   Penumalli, Vikram R, MD  mometasone -formoterol  (DULERA) 200-5 MCG/ACT AERO Inhale 2 puffs into the lungs in the morning and at bedtime. 05/31/21   Kassie Acquanetta Bradley, MD  Multiple Vitamin SUEANNE) TABS Take by mouth.    [provider]  oxybutynin (DITROPAN-XL) 10 MG 24 hr tablet  Take 10 mg by mouth at bedtime.    [provider]  pantoprazole (PROTONIX) 40 MG tablet Take by mouth.    [provider]  vitamin C (ASCORBIC ACID) 500 MG tablet Take by mouth. 01/17/21   [provider]    Physical Exam: BP 112/76 (BP Location: Right Arm)   Pulse 71   Temp (!) 97.2 F (36.2 C) (Oral)   Resp 20   Ht 5' 10 (1.778 m)   SpO2 100%   BMI 21.52 kg/m   General: 78 y.o. year-old male well developed well nourished in no acute distress.  Alert and oriented x3. Cardiovascular: Regular rate and rhythm with no rubs or gallops.  No thyromegaly or JVD noted.  No lower extremity edema. 2/4 pulses in all 4 extremities. Respiratory: Clear to auscultation with no wheezes or rales. Good inspiratory effort. Abdomen: Soft nontender nondistended with normal bowel sounds x4 quadrants. Muskuloskeletal: No cyanosis, clubbing or edema  noted bilaterally Neuro: CN II-XII intact, 3 out of 5 strength in right lower extremity.  Skin: No ulcerative lesions noted or rashes Psychiatry: Judgement and insight appear normal. Mood is appropriate for condition and setting          Labs on Admission:  Basic Metabolic Panel: Recent Labs  Lab 06/16/24 1320  NA 139  K 3.6  CL 103  CO2 26  GLUCOSE 90  BUN 14  CREATININE 0.92  CALCIUM 9.8   Liver Function Tests: Recent Labs  Lab 06/16/24 1320  AST 19  ALT 7  ALKPHOS 74  BILITOT 0.4  PROT 7.1  ALBUMIN 4.0   No results for input(s): LIPASE, AMYLASE in the last 168 hours. No results for input(s): AMMONIA in the last 168 hours. CBC: Recent Labs  Lab 06/16/24 1320 06/16/24 1446  WBC 3.3* 3.2*  NEUTROABS  --  1.8  HGB 13.8 13.7  HCT 40.7 40.6  MCV 99.0 99.0  PLT 195 200   Cardiac Enzymes: No results for input(s): CKTOTAL, CKMB, CKMBINDEX, TROPONINI in the last 168 hours.  BNP (last 3 results) No results for input(s): BNP in the last 8760 hours.  ProBNP (last 3 results) No results for  input(s): PROBNP in the last 8760 hours.  CBG: No results for input(s): GLUCAP in the last 168 hours.  Radiological Exams on Admission: CT HEAD WO CONTRAST Result Date: 06/16/2024 EXAM: CT HEAD WITHOUT CONTRAST 06/16/2024 03:51:29 PM TECHNIQUE: CT of the head was performed without the administration of intravenous contrast. Automated exposure control, iterative reconstruction, and/or weight based adjustment of the mA/kV was utilized to reduce the radiation dose to as low as reasonably achievable. COMPARISON: Head CT 11/21/2023 and MRI 11/14/2023. CLINICAL HISTORY: Neuro deficit, acute, stroke suspected. FINDINGS: BRAIN AND VENTRICLES: There is no evidence of an acute infarct, intracranial hemorrhage, mass, midline shift, hydrocephalus, or extra-axial fluid collection. There is mild cerebral atrophy. Calcified atherosclerosis at the skull base. ORBITS: Bilateral cataract extraction. SINUSES: No acute abnormality. SOFT TISSUES AND SKULL: No acute soft tissue abnormality. No skull fracture. IMPRESSION: 1. No acute intracranial abnormality. Electronically signed by: Dasie Hamburg MD 06/16/2024 04:11 PM EST RP Workstation: [id number]   CT Lumbar Spine Wo Contrast Result Date: 06/16/2024 EXAM: CT OF THE LUMBAR SPINE WITHOUT CONTRAST 06/16/2024 03:51:29 PM TECHNIQUE: CT of the lumbar spine was performed without the administration of intravenous contrast. Multiplanar reformatted images are provided for review. Automated exposure control, iterative reconstruction, and/or weight based adjustment of the mA/kV was utilized to reduce the radiation dose to as low as reasonably achievable. COMPARISON: Lumbar MRI 11/14/2023. CLINICAL HISTORY: Low back pain with right leg weakness over the last 3 weeks. FINDINGS: BONES AND ALIGNMENT: Normal vertebral body heights. No acute fracture or suspicious bone lesion. Normal alignment. 3 mm degenerative retrolisthesis at L1-L2, L2-L3, and L3-L4. 9 mm degenerative anterolisthesis  at L4-L5. Schmorl nodes along the endplates at L1-L2, L2-L3, and L3-L4. Anterior Schmorl node along the T12 inferior endplate. Old deformity of the left L3 transverse process from healed fracture. DEGENERATIVE FINDINGS: Loss of disc height at L1-L2 and L4-L5. T12-L1: Disc bulge noted. No impingement. L1-L2: Disc bulge noted. No impingement. L2-L3: Disc bulge noted. No impingement. L3-L4: Moderate bilateral foraminal stenosis. T2 disc bulge, intervertebral spurring, and facet arthropathy. L4-L5: Prominent central stenosis and moderate bilateral foraminal stenosis due to disc uncovering, left foraminal disc protrusion, and facet arthropathy. Moderate bilateral subarticular lateral recess stenosis. L5-S1: Disc bulge noted. No impingement. SOFT TISSUES: No acute abnormality. Dependent subsegmental  atelectasis or scarring in the left lower lobe. Systemic atherosclerosis is present, including the aorta and iliac arteries. Stable hypodense lesion in the dome of the right hepatic lobe, no change from 10/26/2022, likely cyst or similar benign lesion. No further imaging workup of this lesion is indicated. IMPRESSION: 1. 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 2. Moderate bilateral foraminal stenosis at L3-L4 due to disc bulge, intervertebral spurring, and facet arthropathy. 3. Degenerative findings including 9 mm degenerative anterolisthesis at L4-L5 and 3 mm degenerative retrolisthesis at L1-L2, L2-L3, and L3-L4. Electronically signed by: Ryan Salvage MD 06/16/2024 04:00 PM EST RP Workstation: [id number]    EKG: I independently viewed the EKG done and my findings are as followed: Normal sinus rhythm rate of 79.  QTc 435.  Assessment/Plan Present on Admission:  AMS (altered mental status)  Principal Problem:   AMS (altered mental  status)  Acute metabolic encephalopathy, resolved Alert and oriented x 3. CT head, MRI brain were nonacute. Continue to reorient as needed. Continue fall precautions.  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   Continue pain control PT OT evaluation Fall precautions. May consider neurosurgical evaluation in the morning.  Presumed UTI, POA Continue Rocephin Follow urine culture De-escalate antibiotics as able.  Parkinson's disease The patient stopped taking his Parkinson's medications 2 to 3 months ago. Defer management to neurology, consulted by EDP Continue fall precautions.  Coronary artery disease Peripheral vascular disease Denies any anginal symptoms. Continue to monitor on telemetry.  GERD Resume home PPI   Time: 75 minutes.   DVT prophylaxis: Subcu Lovenox daily.  Code Status: Full code.  Family Communication: None at bedside.  Disposition Plan: Admitted to telemetry unit.  Consults called: Neurology consulted by EDP.  Admission status: Observation status.   Status is: Observation    Terry LOISE Hurst MD [facility] Pager [phone]  If 7PM-7AM, please contact night-coverage [email or web address] Password [facility]  06/16/2024, 9:17 PM

## 2024-06-16 NOTE — ED Notes (Signed)
 Patient transported to [facility] via [facility] at this time

## 2024-06-16 NOTE — ED Provider Notes (Addendum)
  Physical Exam  BP 123/80   Pulse 80   Temp 98.6 F (37 C)   Resp (!) 24   Ht 5' 10 (1.778 m)   SpO2 100%   BMI 21.52 kg/m   Physical Exam  Procedures  Procedures  ED Course / MDM   Clinical Course as of 06/16/24 1724  Tue Jun 16, 2024  1715 Protein(!): TRACE [JL]  1715 Nitrite(!): POSITIVE [JL]  [address]): MODERATE [JL]  1715 WBC, UA: 11-20 [JL]  1715 Bacteria, UA(!): MANY [JL]    Clinical Course User Index [JL] Jerrol Agent, MD   Medical Decision Making Amount and/or Complexity of Data Reviewed Labs: ordered. Decision-making details documented in ED Course. Radiology: ordered.  Risk OTC drugs. Decision regarding hospitalization.   On my evaluation, the patient was confused.  He presents with concern for possible stroke from the [place].  Has had some dysphagia with slurred speech and right-sided weakness.  Has a history of Parkinson's disease.  Had previously been seen [facility] during which time CT head and CT angio head and neck did not show any acute findings.  Has not had an MRI.  Symptoms have been present for the past month.  He has been more confused per his wife who is bedside.  CT Head: IMPRESSION:  1. No acute intracranial abnormality.   CT Lumbar Spine: IMPRESSION:  1. Prominent central stenosis at L4-L5 with moderate bilateral foraminal  stenosis and moderate bilateral subarticular lateral recess stenosis, due to  disc uncovering, left foraminal disc protrusion, and facet arthropathy.  2. Moderate bilateral foraminal stenosis at L3-L4 due to disc bulge,  intervertebral spurring, and facet arthropathy.  3. Degenerative findings including 9 mm degenerative anterolisthesis at L4-L5  and 3 mm degenerative retrolisthesis at L1-L2, L2-L3, and L3-L4.    Labs: Urinalysis positive for UTI with positive nitrites, moderate leukocytes, 11-20 WBCs and many bacteria present.  In the setting of altered mental status,  confusion, UTI, medicine was consulted for admission, will benefit from MRI imaging inpatient in the setting of speech abnormality and weakness. Rocephin administered for UTI. Afebrile, low concern for cauda equina given chronicity of symptoms. Neuro consult was requested and was placed.      Jerrol Agent, MD 06/16/24 [place]ETT    Jerrol Agent, MD 06/16/24 [phone]

## 2024-06-16 NOTE — ED Notes (Signed)
 Called Kim at [facility] for transport 18:56 TC

## 2024-06-16 NOTE — Progress Notes (Addendum)
 [facility]   Bed Availability Yes  Level of Care Needed:  Yes  MD Agree to transfer:   Patient agree to transfer: No  Sharolyn Batman, RN [facility]

## 2024-06-16 NOTE — Plan of Care (Signed)
 Fannie Alomar, is a 78 y.o. male, DOB - 1945/10/15, FMW:[id number]  H/O BPH, HTN, GERD, Anemia, presents to [facility] ER for off and on weakness in both legs, some confusion and slurred speech x 2-3 weeks, in the ER Ct Head -ve, CT L spine +ve for stenosis, good bladder and bowel control, +ve UTI. Admit for UTI, PT, MRI Brain, Neuro and NS input. [facility], tele, 23 hrs Obs.   Vitals:   06/16/24 1500 06/16/24 1515 06/16/24 1530 06/16/24 1615  BP: 124/74 116/68 102/70 123/80  Pulse: 75 74 71 80  Resp: 18 11 14  (!) 24  Temp:      SpO2: 100% 100% 100% 100%  Height:            Data Review   Micro Results No results found for this or any previous visit (from the past 240 hours).  Radiology Reports CT HEAD WO CONTRAST Result Date: 06/16/2024 EXAM: CT HEAD WITHOUT CONTRAST 06/16/2024 03:51:29 PM TECHNIQUE: CT of the head was performed without the administration of intravenous contrast. Automated exposure control, iterative reconstruction, and/or weight based adjustment of the mA/kV was utilized to reduce the radiation dose to as low as reasonably achievable. COMPARISON: Head CT 11/21/2023 and MRI 11/14/2023. CLINICAL HISTORY: Neuro deficit, acute, stroke suspected. FINDINGS: BRAIN AND VENTRICLES: There is no evidence of an acute infarct, intracranial hemorrhage, mass, midline shift, hydrocephalus, or extra-axial fluid collection. There is mild cerebral atrophy. Calcified atherosclerosis at the skull base. ORBITS: Bilateral cataract extraction. SINUSES: No acute abnormality. SOFT TISSUES AND SKULL: No acute soft tissue abnormality. No skull fracture. IMPRESSION: 1. No acute intracranial abnormality. Electronically signed by: Dasie Hamburg MD 06/16/2024 04:11 PM EST RP Workstation: [id number]   CT Lumbar Spine Wo Contrast Result Date: 06/16/2024 EXAM: CT OF THE LUMBAR SPINE WITHOUT CONTRAST 06/16/2024 03:51:29 PM  TECHNIQUE: CT of the lumbar spine was performed without the administration of intravenous contrast. Multiplanar reformatted images are provided for review. Automated exposure control, iterative reconstruction, and/or weight based adjustment of the mA/kV was utilized to reduce the radiation dose to as low as reasonably achievable. COMPARISON: Lumbar MRI 11/14/2023. CLINICAL HISTORY: Low back pain with right leg weakness over the last 3 weeks. FINDINGS: BONES AND ALIGNMENT: Normal vertebral body heights. No acute fracture or suspicious bone lesion. Normal alignment. 3 mm degenerative retrolisthesis at L1-L2, L2-L3, and L3-L4. 9 mm degenerative anterolisthesis at L4-L5. Schmorl nodes along the endplates at L1-L2, L2-L3, and L3-L4. Anterior Schmorl node along the T12 inferior endplate. Old deformity of the left L3 transverse process from healed fracture. DEGENERATIVE FINDINGS: Loss of disc height at L1-L2 and L4-L5. T12-L1: Disc bulge noted. No impingement. L1-L2: Disc bulge noted. No impingement. L2-L3: Disc bulge noted. No impingement. L3-L4: Moderate bilateral foraminal stenosis. T2 disc bulge, intervertebral spurring, and facet arthropathy. L4-L5: Prominent central stenosis and moderate bilateral foraminal stenosis due to disc uncovering, left foraminal disc protrusion, and facet arthropathy. Moderate bilateral subarticular lateral recess stenosis. L5-S1: Disc bulge noted. No impingement. SOFT TISSUES: No acute abnormality. Dependent subsegmental atelectasis or scarring in the left lower lobe. Systemic atherosclerosis is present, including the aorta and iliac arteries. Stable hypodense lesion in the dome of the right hepatic lobe, no change from 10/26/2022, likely cyst or similar benign lesion. No further imaging workup of this lesion is indicated. IMPRESSION: 1. 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 2. Moderate  bilateral foraminal stenosis at L3-L4 due to disc bulge, intervertebral spurring, and facet arthropathy. 3. Degenerative findings including 9 mm degenerative anterolisthesis at L4-L5 and 3 mm degenerative retrolisthesis at L1-L2, L2-L3, and L3-L4. Electronically signed by: Ryan Salvage MD 06/16/2024 04:00 PM EST RP Workstation: [id number]    CBC Recent Labs  Lab 06/16/24 1320 06/16/24 1446  WBC 3.3* 3.2*  HGB 13.8 13.7  HCT 40.7 40.6  PLT 195 200  MCV 99.0 99.0  MCH 33.6 33.4  MCHC 33.9 33.7  RDW 12.1 12.2  LYMPHSABS  --  0.9  MONOABS  --  0.4  EOSABS  --  0.2  BASOSABS  --  0.0    Chemistries  Recent Labs  Lab 06/16/24 1320  NA 139  K 3.6  CL 103  CO2 26  GLUCOSE 90  BUN 14  CREATININE 0.92  CALCIUM 9.8  AST 19  ALT 7  ALKPHOS 74  BILITOT 0.4   ------------------------------------------------------------------------------------------------------------------ CrCl cannot be calculated (Unknown ideal weight.). ------------------------------------------------------------------------------------------------------------------ No results for input(s): HGBA1C in the last 72 hours. ------------------------------------------------------------------------------------------------------------------ No results for input(s): CHOL, HDL, LDLCALC, TRIG, CHOLHDL, LDLDIRECT in the last 72 hours. ------------------------------------------------------------------------------------------------------------------ No results for input(s): TSH, T4TOTAL, T3FREE, THYROIDAB in the last 72 hours.  Invalid input(s): FREET3 ------------------------------------------------------------------------------------------------------------------ No results for input(s): VITAMINB12, FOLATE, FERRITIN, TIBC, IRON, RETICCTPCT in the last 72 hours.  Coagulation profile Recent Labs  Lab  06/16/24 1446  INR 1.0    No results for input(s): DDIMER in the last 72 hours.  Cardiac Enzymes No results for input(s): CKMB, TROPONINI, MYOGLOBIN in the last 168 hours.  Invalid input(s): CK ------------------------------------------------------------------------------------------------------------------ Invalid input(s): POCBNP   Signature  Lavada Stank M.D on 06/16/2024 at 5:36 PM   -  To page go to [email or web address]

## 2024-06-16 NOTE — ED Provider Notes (Signed)
 [place] EMERGENCY DEPARTMENT AT [facility] Provider Note   CSN: [id number] Arrival date & time: 06/16/24  1250     Patient presents with: Dysphagia and Aphasia   Lee [name] is a 78 y.o. male.   Patient sent in from the [place] clinic for concerns for possible stroke.  Patient's main complaints is dysphagia slurred speech right-sided weakness.  Patient was seen at [facility] emergency department brought in from the [place] by EMS on September 9.  Had CT head CT angio head and neck without any acute findings.  Patient does have Parkinson's.  They made note of that as well and he been off his medicines.  This sounds like a very similar presentation.  However his caregiver says that things have been getting worse in the last few days.  Patient not on any of his Parkinson's medicine.  Past medical history significant coronary disease alcohol abuse COPD Parkinson's disease and peripheral vascular disease.  Patient states that the symptoms have been ongoing for a month.  But as stated he was seen at [facility] on September 9 and they talked about very similar symptoms with that visit       Prior to Admission medications   Medication Sig Start Date End Date Taking? Authorizing Provider  albuterol  (PROVENTIL  HFA;VENTOLIN  HFA) 108 (90 Base) MCG/ACT inhaler Inhale 2 puffs into the lungs every 4 (four) hours as needed for wheezing or shortness of breath. 01/05/17   Raford Lenis, MD  Alum Hydroxide-Mag Carbonate 160-105 MG CHEW CHEW 1 TABLET BY MOUTH AS DIRECTED BY YOUR PROVIDER FOR STOMACH UPSET/DYSPEPSIA UP TO THREE TIMES DAILY 10/31/21   [provider]  ascorbic acid (VITAMIN C) 500 MG tablet TAKE ONE TABLET BY MOUTH DAILY TO HELP WITH IRON STORES AND IMMUNE SYSTEM. PLEASE TAKE EVERY DAY AND ESPECIALLY WHEN YOU TAKE FERROUS SULFATE. 04/17/21   [provider]  BLACK PEPPER-TURMERIC PO Take by mouth daily. Mixes as a powder for back pain (ginger,  cinnamon, black pepper, turmeric)    [provider]  Cholecalciferol 50 MCG (2000 UT) TABS     [provider]  CINNAMON PO Mixes as a powder for back pain (ginger, cinnamon, black pepper, turmeric) 02/20/18   [provider]  diclofenac Sodium (VOLTAREN) 1 % GEL     [provider]  FEROSUL 325 (65 Fe) MG tablet Take by mouth. 01/05/21   [provider]  fluticasone (FLONASE) 50 MCG/ACT nasal spray INSTILL 1 SPRAY IN EACH NOSTRIL TWICE A DAY FOR DIZZINESS 05/10/20   [provider]  folic acid (FOLVITE) 1 MG tablet Take 1 mg by mouth daily.    [provider]  Ginger, Zingiber officinalis, (GINGER PO) Mixes as a powder for back pain (ginger, cinnamon, black pepper, turmeric) 02/20/18   [provider]  hydrochlorothiazide (HYDRODIURIL) 25 MG tablet     [provider]  midodrine  (PROAMATINE ) 2.5 MG tablet Take 1 tablet (2.5 mg total) by mouth 3 (three) times daily with meals. 05/03/23   Tat, Asberry RAMAN, DO  mirtazapine  (REMERON ) 7.5 MG tablet Take 1 tablet (7.5 mg total) by mouth at bedtime. 07/05/21   Penumalli, Vikram R, MD  mometasone -formoterol  (DULERA) 200-5 MCG/ACT AERO Inhale 2 puffs into the lungs in the morning and at bedtime. 05/31/21   Kassie Acquanetta Bradley, MD  Multiple Vitamin SUEANNE) TABS Take by mouth.    [provider]  oxybutynin (DITROPAN-XL) 10 MG 24 hr tablet Take 10 mg by mouth at  bedtime.    [provider]  pantoprazole (PROTONIX) 40 MG tablet Take by mouth.    [provider]  vitamin C (ASCORBIC ACID) 500 MG tablet Take by mouth. 01/17/21   [provider]    Allergies: Acyclovir and Enalapril maleate    Review of Systems  Constitutional:  Negative for chills and fever.  HENT:  Positive for trouble swallowing. Negative for ear pain and sore throat.   Eyes:  Negative for pain and visual disturbance.  Respiratory:  Negative for cough and shortness of breath.    Cardiovascular:  Negative for chest pain and palpitations.  Gastrointestinal:  Negative for abdominal pain and vomiting.  Genitourinary:  Negative for dysuria and hematuria.  Musculoskeletal:  Negative for arthralgias and back pain.  Skin:  Negative for color change and rash.  Neurological:  Positive for speech difficulty and weakness. Negative for seizures and syncope.  All other systems reviewed and are negative.   Updated Vital Signs BP 117/71   Pulse 77   Temp 98.6 F (37 C)   Resp 16   Ht 1.778 m (5' 10)   SpO2 100%   BMI 21.52 kg/m   Physical Exam Vitals and nursing note reviewed.  Constitutional:      General: He is not in acute distress.    Appearance: Normal appearance. He is well-developed.  HENT:     Head: Normocephalic and atraumatic.     Mouth/Throat:     Mouth: Mucous membranes are moist.  Eyes:     Extraocular Movements: Extraocular movements intact.     Conjunctiva/sclera: Conjunctivae normal.     Pupils: Pupils are equal, round, and reactive to light.  Cardiovascular:     Rate and Rhythm: Normal rate and regular rhythm.     Heart sounds: No murmur heard. Pulmonary:     Effort: Pulmonary effort is normal. No respiratory distress.     Breath sounds: Normal breath sounds.  Abdominal:     Palpations: Abdomen is soft.     Tenderness: There is no abdominal tenderness.  Musculoskeletal:        General: No swelling.     Cervical back: Normal range of motion and neck supple. No rigidity.  Skin:    General: Skin is warm and dry.     Capillary Refill: Capillary refill takes less than 2 seconds.  Neurological:     Mental Status: He is alert.     Cranial Nerves: Cranial nerve deficit present.     Motor: Weakness present.     Comments: Patient with bilateral lower extremity weakness more so on the right right upper extremity right lower extremity.  Left upper extremity is normal.  Patient has difficulty with speech.  Psychiatric:        Mood and Affect:  Mood normal.     (all labs ordered are listed, but only abnormal results are displayed) Labs Reviewed  CBC - Abnormal; Notable for the following components:      Result Value   WBC 3.3 (*)    RBC 4.11 (*)    All other components within normal limits  COMPREHENSIVE METABOLIC PANEL WITH GFR  URINALYSIS, ROUTINE W REFLEX MICROSCOPIC  PROTIME-INR  APTT  DIFFERENTIAL  URINE DRUG SCREEN  CBC  CBG MONITORING, ED    EKG: EKG Interpretation Date/Time:  Tuesday June 16 2024 13:19:48 EST Ventricular Rate:  79 PR Interval:  204 QRS Duration:  118 QT Interval:  380 QTC Calculation: 435 R Axis:   71  Text Interpretation: Normal sinus rhythm Right bundle branch block Abnormal ECG When compared with ECG of 14-Nov-2023 00:08, PR interval has decreased Confirmed by Svetlana Bagby [id number] on 06/16/2024 2:34:11 PM  Radiology: No results found.   Procedures   Medications Ordered in the ED - No data to display                                  Medical Decision Making Amount and/or Complexity of Data Reviewed Labs: ordered. Radiology: ordered.   Seems as if patient was seen for something very similar September 9 of this year at [facility] emergency department.  Almost dj vu for the reason why he was sent there patient was seen at [facility] clinic today and told to be evaluated for stroke versus Parkinson's.  Complete metabolic panel normal CBC white count is low at 3.3 hemoglobin 13.8 platelets 195.  EKG without significant change.  Will get head CT here.  May need to discuss it with neurology.  But some of this may not be new.  Patient clinically states that the current symptoms been ongoing for a month.  But it sounds as if it is ongoing longer.  Will do stroke order set not a code stroke.  I think a lot of this may be old.  White blood cell count is low at 3.3.  Patient's vital signs here are reassuring with temp of 98.6 pulse 89 respiration 16 blood pressure 124/71 oxygen sats  100% on room air.  Final diagnoses:  None    ED Discharge Orders     None          Danel Studzinski, MD 06/16/24 1457

## 2024-06-16 NOTE — ED Notes (Signed)
 Patient transported to CT

## 2024-06-16 NOTE — ED Notes (Signed)
 Report given to 3 [facility].  All questions answered.

## 2024-06-16 NOTE — ED Notes (Signed)
 All personal belongings transported with patient to [facility]

## 2024-06-17 ENCOUNTER — Observation Stay (HOSPITAL_COMMUNITY)

## 2024-06-17 DIAGNOSIS — G9341 Metabolic encephalopathy: Secondary | ICD-10-CM | POA: Diagnosis not present

## 2024-06-17 LAB — CBC
HCT: 36.1 % — ABNORMAL LOW (ref 39.0–52.0)
Hemoglobin: 12.2 g/dL — ABNORMAL LOW (ref 13.0–17.0)
MCH: 33.4 pg (ref 26.0–34.0)
MCHC: 33.8 g/dL (ref 30.0–36.0)
MCV: 98.9 fL (ref 80.0–100.0)
Platelets: 189 K/uL (ref 150–400)
RBC: 3.65 MIL/uL — ABNORMAL LOW (ref 4.22–5.81)
RDW: 12 % (ref 11.5–15.5)
WBC: 3.1 K/uL — ABNORMAL LOW (ref 4.0–10.5)
nRBC: 0 % (ref 0.0–0.2)

## 2024-06-17 LAB — BASIC METABOLIC PANEL WITH GFR
Anion gap: 11 (ref 5–15)
BUN: 11 mg/dL (ref 8–23)
CO2: 22 mmol/L (ref 22–32)
Calcium: 8.5 mg/dL — ABNORMAL LOW (ref 8.9–10.3)
Chloride: 105 mmol/L (ref 98–111)
Creatinine, Ser: 0.89 mg/dL (ref 0.61–1.24)
GFR, Estimated: >60 mL/min (ref >60)
Glucose, Bld: 92 mg/dL (ref 70–99)
Potassium: 3.8 mmol/L (ref 3.5–5.1)
Sodium: 138 mmol/L (ref 135–145)

## 2024-06-17 LAB — PHOSPHORUS: Phosphorus: 3.1 mg/dL (ref 2.5–4.6)

## 2024-06-17 LAB — MAGNESIUM: Magnesium: 1.9 mg/dL (ref 1.7–2.4)

## 2024-06-17 MED ORDER — DICYCLOMINE HCL 10 MG PO CAPS
10.0000 mg | ORAL_CAPSULE | Freq: Three times a day (TID) | ORAL | Status: AC
Start: 2024-06-17 — End: 2024-06-19
  Administered 2024-06-17 – 2024-06-19 (×8): 10 mg via ORAL
  Filled 2024-06-17 (×9): qty 1

## 2024-06-17 MED ORDER — PANTOPRAZOLE SODIUM 40 MG PO TBEC
40.0000 mg | DELAYED_RELEASE_TABLET | Freq: Every day | ORAL | Status: DC
  Administered 2024-06-17 – 2024-06-19 (×3): 40 mg via ORAL
  Filled 2024-06-17 (×3): qty 1

## 2024-06-17 NOTE — Care Management Obs Status (Signed)
 MEDICARE OBSERVATION STATUS NOTIFICATION   Patient Details  Name: Lee [name] MRN: [id number] Date of Birth: 02/13/1946   Medicare Observation Status Notification Given:  Yes  Obs signed and copy given.   Jsiah Menta 06/17/2024, 3:45 PM

## 2024-06-17 NOTE — NC FL2 (Signed)
 [place]  MEDICAID FL2 LEVEL OF CARE FORM     IDENTIFICATION  Patient Name: Lee [name] Birthdate: [birth year] Sex: male Admission Date (Current Location): 06/16/2024  County and [place] Number:      Facility and Address:         Provider Number:    Attending Physician Name and Address:  Patsy Lenis, MD  Relative Name and Phone Number:       Current Level of Care:   Recommended Level of Care:   Prior Approval Number:    Date Approved/Denied:   PASRR Number:    Discharge Plan:      Current Diagnoses: Patient Active Problem List   Diagnosis Date Noted   Acute metabolic encephalopathy 06/16/2024   Leukopenia 02/28/2022   Acquired deformities of toe(s), unspecified, unspecified foot 11/27/2021   Asthma 11/27/2021   Cervicalgia 11/27/2021   Corns and callosities 11/27/2021   Disturbance of skin sensation 11/27/2021   Elevated prostate specific antigen (PSA) 11/27/2021   Excessive attrition of teeth 11/27/2021   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   Family history of cancer 11/27/2021   Family history of malignant neoplasm of prostate 11/27/2021   Hyperlipidemia 11/27/2021   Iron deficiency anemia, unspecified 11/27/2021   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   Myalgia 11/27/2021   Nail dystrophy 11/27/2021   Benign neoplasm of other parts of mouth 11/27/2021   Open angle with borderline findings, high risk, left eye 11/27/2021   Other abnormal glucose 11/27/2021   Other reduced mobility 11/27/2021   Other secondary cataract, bilateral 11/27/2021   Pain in right thigh 11/27/2021   Pain in right knee 11/27/2021   Pain in unspecified shoulder 11/27/2021   Persons encountering health services in other specified circumstances 11/27/2021   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/27/2021   Right lower quadrant pain 11/27/2021   Tinnitus 11/27/2021    Trochanteric bursitis, right hip 11/27/2021   Unilateral primary osteoarthritis, right hip 11/27/2021   Unspecified fall, initial encounter 11/27/2021   Vitamin D  deficiency, unspecified 11/27/2021   Weakness 11/27/2021   Constipation 08/28/2021   Chronic obstructive pulmonary disease (HCC) 05/31/2021   Depression 04/13/2021   Parkinson's disease 02/28/2021   Weakness of both legs 02/01/2021   Dysphagia 02/01/2021   Weakness of right leg 01/06/2021   Lumbar radiculopathy 12/30/2020   Prostate cancer (HCC) 06/09/2020   Well adult exam 06/04/2019   BPH (benign prostatic hyperplasia) 06/04/2019   Bilateral carotid bruits 06/04/2019   Meniere disease, 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   Emphysema of lung (HCC) 06/04/2019   Spondylolisthesis at L4-L5 level 08/27/2017   Spinal stenosis in cervical region 08/27/2017   Tremor 06/02/2015   Cervical spondylosis without myelopathy 06/02/2015   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   Coronary arteriosclerosis 07/30/2009   History of colonoscopy 07/30/2009   Neutropenia 07/30/2008   HIP PAIN 11/27/2007   Essential hypertension 07/25/2007   Atrial fibrillation (HCC) 07/25/2007   GERD 07/25/2007   ERECTILE DYSFUNCTION 07/25/2007   LOW BACK PAIN 07/25/2007    Orientation RESPIRATION BLADDER Height & Weight            Weight:   Height:  5' 10 (177.8 cm)  BEHAVIORAL SYMPTOMS/MOOD NEUROLOGICAL BOWEL NUTRITION STATUS           AMBULATORY STATUS COMMUNICATION OF NEEDS Skin  Personal Care Assistance Level of Assistance              Functional Limitations Info             SPECIAL CARE FACTORS FREQUENCY                       Contractures      Additional Factors Info                  Current Medications (06/17/2024):  This is the current hospital active medication list Current Facility-Administered Medications  Medication Dose Route Frequency Provider Last Rate Last Admin    acetaminophen  (TYLENOL ) tablet 500 mg  500 mg Oral Q6H PRN Hall, Carole N, DO   500 mg at 06/17/24 0346   cefTRIAXone (ROCEPHIN) 1 g in sodium chloride 0.9 % 100 mL IVPB  1 g Intravenous Q24H Singh, Prashant K, MD       dicyclomine (BENTYL) capsule 10 mg  10 mg Oral TID AC & HS Patsy Lenis, MD   10 mg at 06/17/24 1352   enoxaparin (LOVENOX) injection 40 mg  40 mg Subcutaneous Q24H Hall, Carole N, DO   40 mg at 06/17/24 9071   melatonin tablet 5 mg  5 mg Oral QHS PRN Shona Laurence N, DO       pantoprazole (PROTONIX) EC tablet 40 mg  40 mg Oral Daily Hall, Carole N, DO   40 mg at 06/17/24 9071   polyethylene glycol (MIRALAX / GLYCOLAX) packet 17 g  17 g Oral Daily PRN Shona Laurence N, DO       prochlorperazine (COMPAZINE) injection 5 mg  5 mg Intravenous Q6H PRN Hall, Carole N, DO         Discharge Medications: Please see discharge summary for a list of discharge medications.  Relevant Imaging Results:  Relevant Lab Results:   Additional Information    Landry DELENA Senters, RN

## 2024-06-17 NOTE — Evaluation (Signed)
 Physical Therapy Evaluation Patient Details Name: Lee [name] MRN: [id number] DOB: 09/01/1945 Today's Date: 06/17/2024  History of Present Illness  78 y.o. male presents to [facility]/16/24 with slurred speech and R LE weakness/pain. CT head/MRI negative for acute abnormalities. R LE pain likely related to chronic back pain and L4-L5 foraminal stenosis. Also with + UTI. PMHx: Parkinson's disease stopped taking his Parkinson's medication 2 to 3 months ago, chronic back pain, receives injections to his spine, hypertension, hyperlipidemia, peripheral vascular disease, coronary artery disease, COPD, GERD.   Clinical Impression  PTA pt lived alone and would ambulate short distances with a rollator. Pt has had multiple falls in the past few months with ability to get up off the ground after falling. Pt has had R LE weakness for at least 6 months with worsening symptoms leading up to admission. Pt required ModA for bed mobility and MinA to stand with use of RW. Able to ambulate 46ft with RW and CGA. Pt had limited foot clearance R>L LE with bradykinetic movement pattern (hx of parkinson's). Pt has intermittent assist available from [facility] for 3 hours/day. At this time, recommending <3hrs post acute rehab to prevent future falls and work on independence with mobility. If pt were to return home, he would need 24/7 assist. Acute PT to follow.       If plan is discharge home, recommend the following: A lot of help with walking and/or transfers;A lot of help with bathing/dressing/bathroom;Assistance with cooking/housework;Assist for transportation;Help with stairs or ramp for entrance   Can travel by private vehicle   Yes    Equipment Recommendations None recommended by PT     Functional Status Assessment Patient has had a recent decline in their functional status and demonstrates the ability to make significant improvements in function in a reasonable and predictable amount of time.     Precautions  / Restrictions Precautions Precautions: Fall Precaution/Restrictions Comments: hx Parkinson's Restrictions Weight Bearing Restrictions Per Provider Order: No      Mobility  Bed Mobility Overal bed mobility: Needs Assistance Bed Mobility: Supine to Sit    Supine to sit: Mod assist, HOB elevated, Used rails    General bed mobility comments: assist for RLE mgmt and trunk elevation, exited to the R side    Transfers Overall transfer level: Needs assistance Equipment used: Rolling walker (2 wheels) Transfers: Sit to/from Stand, Bed to chair/wheelchair/BSC Sit to Stand: Min assist   Step pivot transfers: Contact guard assist     General transfer comment: Stood from bed with cues for hand placement/assist for powering up. Cued for safe hand placement via RW.    Ambulation/Gait Ambulation/Gait assistance: Contact guard assist Gait Distance (Feet): 15 Feet Assistive device: Rolling walker (2 wheels) Gait Pattern/deviations: Step-to pattern, Decreased dorsiflexion - right, Decreased stance time - right Gait velocity: decr    General Gait Details: step to patern with R>L decreased foot clearance. Tends to slide R LE forward likely due to decrased R hip flexion. Increased time needed to turn with shortened steps.    Balance Overall balance assessment: Needs assistance Sitting-balance support: No upper extremity supported, Feet supported Sitting balance-Leahy Scale: Fair Sitting balance - Comments: seated EOB   Standing balance support: Bilateral upper extremity supported, During functional activity, Reliant on assistive device for balance Standing balance-Leahy Scale: Poor Standing balance comment: reliant on RW, no overt LOB observed       Pertinent Vitals/Pain Pain Assessment Pain Assessment: No/denies pain    Home Living Family/patient expects to  be discharged to:: Private residence Living Arrangements: Alone Available Help at Discharge: Personal care attendant;Other  (Comment) ([facility] 3 hrs in afternoon, mainly meal prep and laundry, caregiver helps with appointments) Type of Home: House Home Access: Ramped entrance (7-8 feet of walkway between driveway and ramp without railing)    Home Layout: One level Home Equipment: Agricultural Consultant (2 wheels);Rollator (4 wheels);Cane - single point;Shower seat - built in;Grab bars - toilet;Grab bars - tub/shower;Hand held shower head;Electric scooter      Prior Function Prior Level of Function : Driving;Needs assist;History of Falls (last six months) (has assistance with IADLs - meal prep, laundry, light cleaning, transportation to appointments)    Mobility Comments: ModI with rollator, short distances. Has had several falls ADLs Comments: independent with ADLs, HHAide for iADLs     Extremity/Trunk Assessment   Upper Extremity Assessment Upper Extremity Assessment: Defer to OT evaluation    Lower Extremity Assessment Lower Extremity Assessment: LLE deficits/detail;RLE deficits/detail RLE Deficits / Details: Hip flexion 3/5, Knee ext 4+/5, Ankle DF 2-/5 RLE Sensation: decreased light touch LLE Deficits / Details: Hip flexion 4/5, Knee ext 4+/5, Ankle DF 4+/5 LLE Sensation: WNL       Communication   Communication Communication: Impaired Factors Affecting Communication: Reduced clarity of speech (very dysarthric)    Cognition Arousal: Alert Behavior During Therapy: WFL for tasks assessed/performed   PT - Cognitive impairments: No family/caregiver present to determine baseline    Following commands: Intact       Cueing Cueing Techniques: Verbal cues, Visual cues     General Comments General comments (skin integrity, edema, etc.): pleasant & participatory     PT Assessment Patient needs continued PT services  PT Problem List Decreased strength;Decreased range of motion;Decreased activity tolerance;Decreased balance;Decreased mobility       PT Treatment Interventions DME instruction;Gait  training;Functional mobility training;Therapeutic activities;Therapeutic exercise;Balance training;Neuromuscular re-education;Patient/family education    PT Goals (Current goals can be found in the Care Plan section)  Acute Rehab PT Goals Patient Stated Goal: to be able to walk better PT Goal Formulation: With patient Time For Goal Achievement: 07/01/24 Potential to Achieve Goals: Good    Frequency Min 2X/week     Co-evaluation   Reason for Co-Treatment: For patient/therapist safety;To address functional/ADL transfers PT goals addressed during session: Mobility/safety with mobility;Balance;Proper use of DME OT goals addressed during session: ADL's and self-care       AM-PAC PT 6 Clicks Mobility  Outcome Measure Help needed turning from your back to your side while in a flat bed without using bedrails?: A Lot Help needed moving from lying on your back to sitting on the side of a flat bed without using bedrails?: A Lot Help needed moving to and from a bed to a chair (including a wheelchair)?: A Little Help needed standing up from a chair using your arms (e.g., wheelchair or bedside chair)?: A Little Help needed to walk in hospital room?: A Little Help needed climbing 3-5 steps with a railing? : A Lot 6 Click Score: 15    End of Session Equipment Utilized During Treatment: Gait belt Activity Tolerance: Patient tolerated treatment well Patient left: in chair;with call bell/phone within reach;with chair alarm set Nurse Communication: Mobility status PT Visit Diagnosis: Unsteadiness on feet (R26.81);Other abnormalities of gait and mobility (R26.89);Muscle weakness (generalized) (M62.81);History of falling (Z91.81)    Time: 9150-9080 PT Time Calculation (min) (ACUTE ONLY): 30 min   Charges:   PT Evaluation $PT Eval Moderate Complexity: 1 Mod  PT General Charges $$ ACUTE PT VISIT: 1 Visit       Kate ORN, PT, DPT Secure Chat Preferred  Rehab Office [phone]  Kate BRAVO  [name] 06/17/2024, 11:23 AM

## 2024-06-17 NOTE — Plan of Care (Signed)
  Problem: Education: Goal: Knowledge of General Education information will improve Description: Including pain rating scale, medication(s)/side effects and non-pharmacologic comfort measures Outcome: Progressing   Problem: Health Behavior/Discharge Planning: Goal: Ability to manage health-related needs will improve Outcome: Progressing   Problem: Clinical Measurements: Goal: Will remain free from infection Outcome: Progressing   Problem: Activity: Goal: Risk for activity intolerance will decrease Outcome: Progressing   Problem: Nutrition: Goal: Adequate nutrition will be maintained Outcome: Progressing   Problem: Coping: Goal: Level of anxiety will decrease Outcome: Progressing   Problem: Safety: Goal: Ability to remain free from injury will improve Outcome: Progressing   Problem: Pain Managment: Goal: General experience of comfort will improve and/or be controlled Outcome: Progressing

## 2024-06-17 NOTE — Care Management Obs Status (Signed)
 MEDICARE OBSERVATION STATUS NOTIFICATION   Patient Details  Name: Lee [name] MRN: [id number] Date of Birth: September 08, 1945   Medicare Observation Status Notification Given:  Yes Due to the patients medical condition spoke with is sis ter to advised of the obs notice  will leave a copy in the patients room.    Tyne Banta 06/17/2024, 2:27 PM

## 2024-06-17 NOTE — Progress Notes (Signed)
 Pt transported to MRI by transport personnel. Pt voiced nil complaints. Nil distress observed, pt's safety maintained. Pt took his investments' with him in  apt belonging bag. VS monitored prior to transport.

## 2024-06-17 NOTE — Progress Notes (Signed)
 Re: Lee [name] DOB:1946/05/31 Date:06/17/2024   To Whom It May Concern:  Please be advised that the above-named patient will require a short-term nursing home stay--anticipated 30 days or less for rehabilitation and strengthening. The plan is for home.

## 2024-06-17 NOTE — Evaluation (Signed)
 Clinical/Bedside Swallow Evaluation Patient Details  Name: Lee [name] MRN: [id number] Date of Birth: [birth year]  Today's Date: 06/17/2024 Time: SLP Start Time (ACUTE ONLY): 9081 SLP Stop Time (ACUTE ONLY): 0929 SLP Time Calculation (min) (ACUTE ONLY): 11 min  Past Medical History:  Past Medical History:  Diagnosis Date   Acid reflux    Alcohol abuse    quit 10 y ago   Arthritis    BPH (benign prostatic hyperplasia)    CAD (coronary artery disease)    Cervical spondylosis without myelopathy 06/02/2015   COPD (chronic obstructive pulmonary disease) (HCC)    Emphysema lung (HCC)    GERD (gastroesophageal reflux disease)    High cholesterol    Hypertension    Parkinson's disease (HCC)    PVD (peripheral vascular disease)    Tremor 06/02/2015   Past Surgical History:  Past Surgical History:  Procedure Laterality Date   TONSILLECTOMY     VASECTOMY     HPI:  Lee [name] is a 78 y.o. male with worsening slurred speech, LE weakness. Found to have pyuria, acute metabolic encephalopathy resolved, RLE weakness suspect from back pain per MD. MRI no acute intracranial abnormality. In ED pt reported difficulty swallowing saliva x 3 weeks. MBS 02/2022 functional oropharyngeal swallow with question of esophageal symptoms. GI recommended barium esophagram 04/2023 mild stricture above GE junction, pill entered stomach without difficulty. EGD several years ago with dilation with minimal improvement per pt. GI offered EGD however no documentation of completion. CT cervical spine straightening of the cervical spine with multilevel degenerative changes. PMH: Parkinson's disease (per chart stopped taking his Parkinson's medication 2 to 3 months ago), GERD, COPD chronic back pain    Assessment / Plan / Recommendation  Clinical Impression  Pt with history of esophageal dysphagia presents with potential pharyngeal dysphagia with suspected esophageal component. He is missing bilateral posterior  dentition and reports accumulation and coughing with saliva more than food/liquids and pharyngeal globus sensation. No coughing with thin or applesauce but with multiple swallows appearing effortful with thin, stating the applesauce was easier to swallow than the water. Given clincial observations, deconditioning,   Parkinson's and history of cervical spine straightening and degenerative changes, recommend MBS scheduled today at 12. Can continue regular/thin until MBS. SLP Visit Diagnosis: Dysphagia, unspecified (R13.10)    Aspiration Risk       Diet Recommendation Regular;Thin liquid    Liquid Administration via: Cup;Straw Medication Administration: Whole meds with liquid Supervision: Patient able to self feed Postural Changes: Seated upright at 90 degrees;Remain upright for at least 30 minutes after po intake    Other  Recommendations Oral Care Recommendations: Oral care BID     Assistance Recommended at Discharge    Functional Status Assessment    Frequency and Duration            Prognosis        Swallow Study   General Date of Onset: 06/17/24 HPI: Lee [name] is a 78 y.o. male with worsening slurred speech, LE weakness. Found to have pyuria, acute metabolic encephalopathy resolved, RLE weakness suspect from back pain per MD. MRI no acute intracranial abnormality. In ED pt reported difficulty swallowing saliva x 3 weeks. MBS 02/2022 functional oropharyngeal swallow with question of esophageal symptoms. GI recommended barium esophagram 04/2023 mild stricture above GE junction, pill entered stomach without difficulty. EGD several years ago with dilation with minimal improvement per pt. GI offered EGD however no documentation of completion. CT cervical spine straightening of the cervical spine with  multilevel degenerative changes. PMH: Parkinson's disease (per chart stopped taking his Parkinson's medication 2 to 3 months ago), GERD, COPD chronic back pain Type of Study: Bedside  Swallow Evaluation Previous Swallow Assessment:  (see HPI) Diet Prior to this Study: Regular;Thin liquids (Level 0) Temperature Spikes Noted: No Respiratory Status: Room air History of Recent Intubation: No Behavior/Cognition: Alert;Cooperative;Pleasant mood Oral Cavity Assessment: Other (comment) (lingual candidias) Oral Care Completed by SLP: No Oral Cavity - Dentition: Missing dentition (missing bil posterior) Vision: Functional for self-feeding Self-Feeding Abilities: Able to feed self Patient Positioning: Upright in chair Baseline Vocal Quality: Low vocal intensity    Oral/Motor/Sensory Function Overall Oral Motor/Sensory Function: Within functional limits   Ice Chips Ice chips: Not tested   Thin Liquid Thin Liquid: Impaired Presentation: Cup Oral Phase Impairments:  (none) Pharyngeal  Phase Impairments: Multiple swallows (appeared effortful)    Nectar Thick Nectar Thick Liquid: Not tested   Honey Thick Honey Thick Liquid: Not tested   Puree Puree: Within functional limits   Solid     Solid: Not tested      Lee [name] 06/17/2024,9:44 AM

## 2024-06-17 NOTE — Progress Notes (Signed)
 Progress Note    Lee [name]   FMW:[id number]  DOB: 05-16-1946  DOA: 06/16/2024     0 PCP: Rik Glinda DASEN, FNP  Initial CC: Slurred speech  Hospital Course: Lee [name] is a 78 y.o. male with medical history significant for Parkinson's disease stopped taking his Parkinson's medication due to no benefit he says, chronic back pain, receives injections to his spine, hypertension, hyperlipidemia, peripheral vascular disease, coronary artery disease, COPD, GERD, who presented to [facility] ED sent from [facility] urgent care to rule out stroke.  The patient endorses worsening slurred speech for the past 2 weeks and the day of admission the slurred speech was much worse to the point where his caretaker could not understand the words he was speaking.  Also endorses right lower extremity weakness and pain from his back all the way down to his right leg.   CT head revealed no acute intracranial abnormality.  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  Moderate bilateral foraminal stenosis at L3-L4 due to disc bulge, intervertebral spurring and facet arthropathy.  Degenerative findings including 9 mm degenerative anterolisthesis at L4-5 and 3 mm degenerative breakthrough listhesis at L1-L2, L2-L3, L3-L4.   UA returned positive for pyuria.  The patient was started on Rocephin in the ER.  Urine culture is pending.   Due to concern for possible stroke, TRH was asked to admit.   MRI brain was negative for acute intracranial abnormality.   The patient continues to have limited active range of motion and pain in his right lower extremity, likely from his chronic back pain, L4-L5 foraminal stenosis.  Analgesics, PT OT to evaluate.   Assessment/Plan   Acute metabolic  encephalopathy, resolved Alert and oriented x 3. CT head, MRI brain were nonacute. Continue fall precautions.   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   Continue pain control PT OT evaluations Fall precautions.   Presumed UTI, POA Continue Rocephin Follow urine culture De-escalate antibiotics as able.   Parkinson's disease The patient stopped taking his Parkinson's medications - He does not wish to resume at this time; states had no benefit while on treatment; pharmacy states last fill looks to be 2022   Coronary artery disease Peripheral vascular disease Denies any anginal symptoms. Continue to monitor on telemetry.   GERD Resume home PPI  Interval History:  Pressured speech but understandable this morning.  Still declines wanting to go back on any Parkinson's meds.   Antimicrobials: Rocephin 06/16/2024 >> current  DVT prophylaxis:  enoxaparin (LOVENOX) injection 40 mg Start: 06/16/24 0800   Code Status:   Code Status: Full Code  Mobility Assessment (Last 72 Hours)     Mobility Assessment     Row Name 06/17/24 1117 06/17/24 0929 06/16/24 2046       Does the patient have exclusion criteria? -- -- No - Perform mobility assessment     What is the highest level of mobility based on the mobility assessment? Level 4 (Ambulates with assistance) - Balance while stepping forward/back - Complete Level 4 (Ambulates with assistance) - Balance while stepping forward/back - Complete --  Diet: Diet Orders (From admission, onward)     Start     Ordered   06/16/24 2118  Diet regular Room service appropriate? Yes; Fluid consistency: Thin  Diet effective now       Question Answer Comment  Room service  appropriate? Yes   Fluid consistency: Thin      06/16/24 2117            Barriers to discharge: None Disposition Plan: TBD HH orders placed: TBD Status is: Observation  Objective: Blood pressure 110/65, pulse 82, temperature 98.6 F (37 C), temperature source Axillary, resp. rate 16, height 5' 10 (1.778 m), SpO2 98%.  Examination:  Physical Exam Constitutional:      General: He is not in acute distress.    Appearance: Normal appearance.  HENT:     Head: Normocephalic and atraumatic.     Mouth/Throat:     Mouth: Mucous membranes are moist.  Eyes:     Extraocular Movements: Extraocular movements intact.  Cardiovascular:     Rate and Rhythm: Normal rate and regular rhythm.  Pulmonary:     Effort: Pulmonary effort is normal. No respiratory distress.     Breath sounds: Normal breath sounds. No wheezing.  Abdominal:     General: Bowel sounds are normal. There is no distension.     Palpations: Abdomen is soft.     Tenderness: There is no abdominal tenderness.  Musculoskeletal:        General: Normal range of motion.     Cervical back: Normal range of motion and neck supple.  Skin:    General: Skin is warm and dry.  Neurological:     General: No focal deficit present.     Mental Status: He is alert.  Psychiatric:        Mood and Affect: Mood normal.        Behavior: Behavior normal.      Consultants:    Procedures:    Data Reviewed: Results for orders placed or performed during the hospital encounter of 06/16/24 (from the past 24 hours)  Protime-INR     Status: None   Collection Time: 06/16/24  2:46 PM  Result Value Ref Range   Prothrombin Time 13.7 11.4 - 15.2 seconds   INR 1.0 0.8 - 1.2  APTT     Status: None   Collection Time: 06/16/24  2:46 PM  Result Value Ref Range   aPTT 31 24 - 36 seconds  Differential     Status: None   Collection Time: 06/16/24  2:46 PM  Result Value Ref Range   Neutrophils Relative % 55 %   Neutro Abs 1.8 1.7 - 7.7 K/uL    Lymphocytes Relative 27 %   Lymphs Abs 0.9 0.7 - 4.0 K/uL   Monocytes Relative 12 %   Monocytes Absolute 0.4 0.1 - 1.0 K/uL   Eosinophils Relative 5 %   Eosinophils Absolute 0.2 0.0 - 0.5 K/uL   Basophils Relative 1 %   Basophils Absolute 0.0 0.0 - 0.1 K/uL   Immature Granulocytes 0 %   Abs Immature Granulocytes 0.00 0.00 - 0.07 K/uL  CBC     Status: Abnormal   Collection Time: 06/16/24  2:46 PM  Result Value Ref Range   WBC 3.2 (L) 4.0 - 10.5 K/uL   RBC 4.10 (L) 4.22 - 5.81 MIL/uL   Hemoglobin 13.7 13.0 - 17.0 g/dL   HCT 59.3 60.9 - 47.9 %   MCV 99.0 80.0 - 100.0 fL  MCH 33.4 26.0 - 34.0 pg   MCHC 33.7 30.0 - 36.0 g/dL   RDW 87.7 88.4 - 84.4 %   Platelets 200 150 - 400 K/uL   nRBC 0.0 0.0 - 0.2 %  CBC     Status: Abnormal   Collection Time: 06/17/24  4:09 AM  Result Value Ref Range   WBC 3.1 (L) 4.0 - 10.5 K/uL   RBC 3.65 (L) 4.22 - 5.81 MIL/uL   Hemoglobin 12.2 (L) 13.0 - 17.0 g/dL   HCT 63.8 (L) 60.9 - 47.9 %   MCV 98.9 80.0 - 100.0 fL   MCH 33.4 26.0 - 34.0 pg   MCHC 33.8 30.0 - 36.0 g/dL   RDW 87.9 88.4 - 84.4 %   Platelets 189 150 - 400 K/uL   nRBC 0.0 0.0 - 0.2 %  Basic metabolic panel     Status: Abnormal   Collection Time: 06/17/24  4:09 AM  Result Value Ref Range   Sodium 138 135 - 145 mmol/L   Potassium 3.8 3.5 - 5.1 mmol/L   Chloride 105 98 - 111 mmol/L   CO2 22 22 - 32 mmol/L   Glucose, Bld 92 70 - 99 mg/dL   BUN 11 8 - 23 mg/dL   Creatinine, Ser 9.10 0.61 - 1.24 mg/dL   Calcium 8.5 (L) 8.9 - 10.3 mg/dL   GFR, Estimated >39 >39 mL/min   Anion gap 11 5 - 15  Magnesium     Status: None   Collection Time: 06/17/24  4:09 AM  Result Value Ref Range   Magnesium 1.9 1.7 - 2.4 mg/dL  Phosphorus     Status: None   Collection Time: 06/17/24  4:09 AM  Result Value Ref Range   Phosphorus 3.1 2.5 - 4.6 mg/dL    I have reviewed pertinent nursing notes, vitals, labs, and images as necessary. I have ordered labwork to follow up on as indicated.  I have  reviewed the last notes from staff over past 24 hours. I have discussed patient's care plan and test results with nursing staff, CM/SW, and other staff as appropriate.  Old records reviewed in assessment of this patient  Time spent: Greater than 50% of the 55 minute visit was spent in counseling/coordination of care for the patient as laid out in the A&P.   LOS: 0 days   Alm Apo, MD [facility] 06/17/2024, 2:37 PM

## 2024-06-17 NOTE — TOC Initial Note (Addendum)
 Transition of Care [facility]) - Initial/Assessment Note    Patient Details  Name: Lee [name] MRN: [id number] Date of Birth: [birth year]  [facility] CM/SW Contact:    Landry DELENA Senters, RN Phone Number: 06/17/2024, 11:52 AM  Clinical Narrative:                  Chief Complaint: Right lower extremity weakness and pain, and slurred speech   Patient lives at home alone, does report having caregiver for 3 hours/day, 4 days/week. His sister, Vernell, comes to check on him every other week. Patient reports he stopped his Parkinson medications about 3 months ago, does not like the way they make him feel. He reports taking other medications with no problems.   Patient is active with [facility]. They are aware of patient being in hospital. Patient reports his sister,Geraldine is his POA.   Currently waiting for therapy evals.   CM will continue to follow.   1605: Therapy recommendation for SNF. Patient agreeable to this and being faxed out in [place]. CM will provide bed offers when available. PASAR went to level 2, awaiting result.   Expected Discharge Plan:  (TBD) Barriers to Discharge: Continued Medical Work up   Patient Goals and CMS Choice            Expected Discharge Plan and Services       Living arrangements for the past 2 months: Single Family Home                                      Prior Living Arrangements/Services Living arrangements for the past 2 months: Single Family Home Lives with:: Self Patient language and need for interpreter reviewed:: Yes Do you feel safe going back to the place where you live?: Yes      Need for Family Participation in Patient Care: Yes (Comment) Care giver support system in place?: Yes (comment) Current home services: DME (cane, rollator, W/C, walk in tub with bench) Criminal Activity/Legal Involvement Pertinent to Current Situation/Hospitalization: No - Comment as needed  Activities of Daily  Living   ADL Screening (condition at time of admission) Independently performs ADLs?: No Does the patient have a NEW difficulty with bathing/dressing/toileting/self-feeding that is expected to last >3 days?: No Does the patient have a NEW difficulty with getting in/out of bed, walking, or climbing stairs that is expected to last >3 days?: No Does the patient have a NEW difficulty with communication that is expected to last >3 days?: No Is the patient deaf or have difficulty hearing?: No Does the patient have difficulty seeing, even when wearing glasses/contacts?: No Does the patient have difficulty concentrating, remembering, or making decisions?: No  Permission Sought/Granted                  Emotional Assessment Appearance:: Developmentally appropriate Attitude/Demeanor/Rapport: Engaged Affect (typically observed): Calm Orientation: : Oriented to Self, Oriented to Place, Oriented to  Time Alcohol / Substance Use: Not Applicable Psych Involvement: No (comment)  Admission diagnosis:  Acute cystitis without hematuria [N30.00] Stroke-like symptoms [R29.90] Altered mental status, unspecified altered mental status type [R41.82] AMS (altered mental status) [R41.82] Patient Active Problem List   Diagnosis Date Noted   AMS (altered mental status) 06/16/2024   Leukopenia 02/28/2022   Acquired deformities of toe(s), unspecified, unspecified foot 11/27/2021   Asthma 11/27/2021   Cervicalgia 11/27/2021   Corns and callosities 11/27/2021  Disturbance of skin sensation 11/27/2021   Elevated prostate specific antigen (PSA) 11/27/2021   Excessive attrition of teeth 11/27/2021   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   Family history of cancer 11/27/2021   Family history of malignant neoplasm of prostate 11/27/2021   Hyperlipidemia 11/27/2021   Iron deficiency anemia, unspecified 11/27/2021   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   Myalgia 11/27/2021   Nail  dystrophy 11/27/2021   Benign neoplasm of other parts of mouth 11/27/2021   Open angle with borderline findings, high risk, left eye 11/27/2021   Other abnormal glucose 11/27/2021   Other reduced mobility 11/27/2021   Other secondary cataract, bilateral 11/27/2021   Pain in right thigh 11/27/2021   Pain in right knee 11/27/2021   Pain in unspecified shoulder 11/27/2021   Persons encountering health services in other specified circumstances 11/27/2021   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/27/2021   Right lower quadrant pain 11/27/2021   Tinnitus 11/27/2021   Trochanteric bursitis, right hip 11/27/2021   Unilateral primary osteoarthritis, right hip 11/27/2021   Unspecified fall, initial encounter 11/27/2021   Vitamin D  deficiency, unspecified 11/27/2021   Weakness 11/27/2021   Constipation 08/28/2021   Chronic obstructive pulmonary disease (HCC) 05/31/2021   Depression 04/13/2021   Parkinson's disease 02/28/2021   Weakness of both legs 02/01/2021   Dysphagia 02/01/2021   Weakness of right leg 01/06/2021   Lumbar radiculopathy 12/30/2020   Prostate cancer (HCC) 06/09/2020   Well adult exam 06/04/2019   BPH (benign prostatic hyperplasia) 06/04/2019   Bilateral carotid bruits 06/04/2019   Meniere disease, 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   Emphysema of lung (HCC) 06/04/2019   Spondylolisthesis at L4-L5 level 08/27/2017   Spinal stenosis in cervical region 08/27/2017   Tremor 06/02/2015   Cervical spondylosis without myelopathy 06/02/2015   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   Coronary arteriosclerosis 07/30/2009   History of colonoscopy 07/30/2009   Neutropenia 07/30/2008   HIP PAIN 11/27/2007   Essential hypertension 07/25/2007   Atrial fibrillation (HCC) 07/25/2007   GERD 07/25/2007   ERECTILE DYSFUNCTION 07/25/2007   LOW BACK PAIN 07/25/2007   PCP:  Rik Glinda DASEN, FNP Pharmacy:   [facility] - [place], [place] - [address] [place] [address] Phone: [phone] Fax: [phone]     Social Drivers of Health (SDOH) Social History: SDOH Screenings   Food Insecurity: No Food Insecurity (06/16/2024)  Housing: Low Risk  (06/16/2024)  Transportation Needs: No Transportation Needs (06/16/2024)  Utilities: Not At Risk (06/16/2024)  Alcohol Screen: Low Risk  (03/26/2022)  Depression (PHQ2-9): High Risk (05/30/2022)  Financial Resource Strain: Low Risk  (03/26/2022)  Physical Activity: Sufficiently Active (03/26/2022)  Social Connections: Moderately Integrated (06/17/2024)  Stress: No Stress Concern Present (03/26/2022)  Tobacco Use: Medium Risk (06/16/2024)   SDOH Interventions:     Readmission Risk Interventions     No data to display

## 2024-06-17 NOTE — Evaluation (Signed)
 Occupational Therapy Evaluation Patient Details Name: Lee [name] MRN: [id number] DOB: [birth year] Today's Date: 06/17/2024   History of Present Illness   78 y.o. male presents to [facility] 06/16/24 with slurred speech and R LE weakness/pain. CT head/MRI negative for acute abnormalities. R LE pain likely related to chronic back pain and L4-L5 foraminal stenosis. Also with + UTI. PMHx: Parkinson's disease stopped taking his Parkinson's medication 2 to 3 months ago, chronic back pain, receives injections to his spine, hypertension, hyperlipidemia, peripheral vascular disease, coronary artery disease, COPD, GERD.     Clinical Impressions Pt greeted in bed, agreeable for OT visit. Pt presents with dysarthric speech (often difficult to understand). PTA, he was reportedly living alone and had a PCA M-Th for 3 hours/day whom would assist with IADLs (laundry, meal prep, transportation). Reports progressive RLE weakness, but has not been using AD for mobility. Indep with ADLs. Functionally, pt presents with generalized weakness, overall requiring up to mod A for bed mobility, min A for transfers and CGA for ambulation with RW. Needed up to mod A for LB ADLs and min A for standing UB ADLs. Classic Parkinsonian features observed, rather bradykinetic, somewhat hypophonic. Left upright in chair.   Pt is currently functioning below baseline and would benefit from ongoing acute OT services to progress towards safe discharge and to facilitate return to prior level of function. Current recommendation is post-acute rehab (< 3 hours/day).     If plan is discharge home, recommend the following:   A little help with walking and/or transfers;A lot of help with bathing/dressing/bathroom;Assistance with cooking/housework;Direct supervision/assist for medications management;Direct supervision/assist for financial management;Assist for transportation;Supervision due to cognitive status     Functional Status  Assessment   Patient has had a recent decline in their functional status and demonstrates the ability to make significant improvements in function in a reasonable and predictable amount of time.     Equipment Recommendations   Other (comment) (defer to next level of care)     Recommendations for Other Services         Precautions/Restrictions   Precautions Precautions: Fall Precaution/Restrictions Comments: hx Parkinson's Restrictions Weight Bearing Restrictions Per Provider Order: No     Mobility Bed Mobility Overal bed mobility: Needs Assistance Bed Mobility: Supine to Sit     Supine to sit: Mod assist, HOB elevated, Used rails     General bed mobility comments: assist for RLE mgmt and trunk elevation, exited to the R side    Transfers Overall transfer level: Needs assistance Equipment used: Rolling walker (2 wheels) Transfers: Sit to/from Stand, Bed to chair/wheelchair/BSC Sit to Stand: Min assist     Step pivot transfers: Contact guard assist     General transfer comment: Stood from bed with cues for hand placement/assist for powering up. Cued for safe hand placement via RW.      Balance Overall balance assessment: Needs assistance Sitting-balance support: No upper extremity supported, Feet supported Sitting balance-Leahy Scale: Fair Sitting balance - Comments: seated EOB   Standing balance support: Bilateral upper extremity supported, During functional activity, Reliant on assistive device for balance Standing balance-Leahy Scale: Poor Standing balance comment: reliant on RW, no overt LOB observed                           ADL either performed or assessed with clinical judgement   ADL Overall ADL's : Needs assistance/impaired Eating/Feeding: NPO Eating/Feeding Details (indicate cue type and reason): pending SLP eval Grooming:  Minimal assistance;Standing;Wash/dry hands;Oral care Grooming Details (indicate cue type and reason): assist  to open grooming supply containers and for stability             Lower Body Dressing: Minimal assistance;Sitting/lateral leans Lower Body Dressing Details (indicate cue type and reason): assist for sliding heels into sneakers via forward reach method, incr time/effort to doff and re-apply socks     Toileting- Clothing Manipulation and Hygiene: Minimal assistance;Sit to/from stand Toileting - Clothing Manipulation Details (indicate cue type and reason): assist for clothing mgmt up & stability while standing to use urinal     Functional mobility during ADLs: Contact guard assist;Rolling walker (2 wheels)       Vision Baseline Vision/History: 1 Wears glasses Ability to See in Adequate Light: 0 Adequate Patient Visual Report: No change from baseline       Perception         Praxis         Pertinent Vitals/Pain Pain Assessment Pain Assessment: No/denies pain     Extremity/Trunk Assessment Upper Extremity Assessment Upper Extremity Assessment: Generalized weakness   Lower Extremity Assessment Lower Extremity Assessment: Defer to PT evaluation       Communication Communication Communication: Impaired Factors Affecting Communication: Reduced clarity of speech (very dysarthric)   Cognition Arousal: Alert Behavior During Therapy: WFL for tasks assessed/performed Cognition: No apparent impairments             OT - Cognition Comments: reduced insight                 Following commands: Intact       Cueing  General Comments   Cueing Techniques: Verbal cues;Visual cues  pleasant & participatory   Exercises     Shoulder Instructions      Home Living Family/patient expects to be discharged to:: Private residence Living Arrangements: Alone Available Help at Discharge: Personal care attendant;Other (Comment) (PCA M-Th 3 hrs in afternoon, mainly meal prep and laundry, caregiver helps with appointments) Type of Home: House Home Access: Ramped  entrance (7-8 feet of walkway between driveway and ramp without railing)     Home Layout: One level     Bathroom Shower/Tub: Other (comment) (walk in bath)   Bathroom Toilet: Handicapped height Bathroom Accessibility: Yes   Home Equipment: Agricultural Consultant (2 wheels);Rollator (4 wheels);Cane - single point;Shower seat - built in;Grab bars - toilet;Grab bars - tub/shower;Hand held shower head;Electric scooter          Prior Functioning/Environment Prior Level of Function : Driving;Needs assist;History of Falls (last six months) (has assistance with IADLs - meal prep, laundry, light cleaning, transportation to appointments)             Mobility Comments: ModI with rollator, short distances. Has had several falls ADLs Comments: independent with ADLs, HHAide for iADLs    OT Problem List: Decreased strength;Decreased activity tolerance;Impaired balance (sitting and/or standing);Decreased safety awareness   OT Treatment/Interventions: Self-care/ADL training;Therapeutic exercise;Energy conservation;Therapeutic activities;Cognitive remediation/compensation;Patient/family education;Balance training      OT Goals(Current goals can be found in the care plan section)   Acute Rehab OT Goals Patient Stated Goal: feel better OT Goal Formulation: With patient Time For Goal Achievement: 07/01/24 Potential to Achieve Goals: Good   OT Frequency:  Min 2X/week    Co-evaluation PT/OT/SLP Co-Evaluation/Treatment: Yes Reason for Co-Treatment: For patient/therapist safety;To address functional/ADL transfers   OT goals addressed during session: ADL's and self-care      AM-PAC OT 6 Clicks Daily Activity  Outcome Measure Help from another person eating meals?: None Help from another person taking care of personal grooming?: A Little Help from another person toileting, which includes using toliet, bedpan, or urinal?: A Little Help from another person bathing (including washing,  rinsing, drying)?: A Lot Help from another person to put on and taking off regular upper body clothing?: A Little Help from another person to put on and taking off regular lower body clothing?: A Lot 6 Click Score: 17   End of Session Equipment Utilized During Treatment: Gait belt;Rolling walker (2 wheels) Nurse Communication: Mobility status  Activity Tolerance: Patient tolerated treatment well Patient left: in chair;with call bell/phone within reach;with chair alarm set  OT Visit Diagnosis: Unsteadiness on feet (R26.81);History of falling (Z91.81);Other abnormalities of gait and mobility (R26.89)                Time: 9150-9081 OT Time Calculation (min): 29 min Charges:  OT General Charges $OT Visit: 1 Visit OT Evaluation $OT Eval Moderate Complexity: 1 Mod  Daunte Oestreich M. Bur[place], OTR/L [facility] Acute Rehabilitation Services [phone] Secure Chat Preferred  Rikki Burma 06/17/2024, 11:06 AM

## 2024-06-17 NOTE — Progress Notes (Signed)
 Modified Barium Swallow Study  Patient Details  Name: Lee [name] MRN: [id number] Date of Birth: August 17, 1945  Today's Date: 06/17/2024  Modified Barium Swallow completed.  Full report located under Chart Review in the Imaging Section.  History of Present Illness Lee [name] is a 78 y.o. male with worsening slurred speech, LE weakness. Found to have pyuria, acute metabolic encephalopathy resolved, RLE weakness suspect from back pain per MD. MRI no acute intracranial abnormality. In ED pt reported difficulty swallowing saliva x 3 weeks. MBS 02/2022 functional oropharyngeal swallow with question of esophageal symptoms. GI recommended barium esophagram 04/2023 mild stricture above GE junction, pill entered stomach without difficulty. EGD several years ago with dilation with minimal improvement per pt. GI offered EGD however no documentation of completion. CT cervical spine straightening of the cervical spine with multilevel degenerative changes. PMH: Parkinson's disease (per chart stopped taking his Parkinson's medication 2 to 3 months ago), GERD, COPD chronic back pain   Clinical Impression Pt demonstrated a mild pharyngeal dysphagia with adequate mobility of larynx to elevate and establish effective closure with one instance of decreased timing resulting in late elevation with  penetration during sequential straw sips thin. This was spontaneously ejected during 2nd swallow in sequence. During pill and thin he had trace high penetration that cleared with cue to cough. Tongue base retraction  was consistently reduced leading to frequent however mild residue in valleculae and once in pyriform sinuses decreased volume with heavier viscosities. Cues to produce a second effortful swallow intermittently decreased residue. Esophageal scan revealed timely transit of pill into GE junction. Recommend continue regular texture, thin, small sips, multiple swallows, intermittent throat clears, single pills  with thin and remain upright after meals due to history of GERD and esophageal stricture. ST will follow once to reiterate strategies. Factors that may increase risk of adverse event in presence of aspiration Lee [name] & Lee [name] 2021):  (Parkinsons)  Swallow Evaluation Recommendations Recommendations: PO diet PO Diet Recommendation: Regular;Thin liquids (Level 0) Liquid Administration via: Cup;Straw Medication Administration: Whole meds with liquid      Lee [name] 06/17/2024,2:21 PM

## 2024-06-17 NOTE — Hospital Course (Addendum)
 Lee [name] is a 78 y.o. male with medical history significant for Parkinson's disease stopped taking his Parkinson's medication due to no benefit he says, chronic back pain, receives injections to his spine, hypertension, hyperlipidemia, peripheral vascular disease, coronary artery disease, COPD, GERD, who presented to [facility] ED sent from [place] urgent care to rule out stroke.  The patient endorses worsening slurred speech for the past 2 weeks and the day of admission the slurred speech was much worse to the point where his caretaker could not understand the words he was speaking.  Also endorses right lower extremity weakness and pain from his back all the way down to his right leg.   CT head revealed no acute intracranial abnormality.  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  Moderate bilateral foraminal stenosis at L3-L4 due to disc bulge, intervertebral spurring and facet arthropathy.  Degenerative findings including 9 mm degenerative anterolisthesis at L4-5 and 3 mm degenerative breakthrough listhesis at L1-L2, L2-L3, L3-L4.   UA returned positive for pyuria.  The patient was started on Rocephin  in the ER.  Urine culture is pending.   Due to concern for possible stroke, [facility] was asked to admit.   MRI brain was negative for acute intracranial abnormality.   The patient continues to have limited active range of motion and pain in his right lower extremity, likely from his chronic back pain, L4-L5 foraminal stenosis.  Analgesics, PT OT to evaluate.   Assessment/Plan   Acute metabolic encephalopathy, resolved Alert and oriented x 3. CT head, MRI brain were nonacute. Continue fall precautions.   Right lower extremity weakness and pain suspect from chronic  back pain Seen on CT scan, prominent central stenosis at L4-L5 with moderate bilateral foraminal stenosis and moderate bilateral subarticular lateral recess stenosis, due to disc uncovering, left foraminal disc protrusion and facet arthropathy.   PT OT evaluations, SNF recommended  Groin pain - odd description and present chronically. No palpable hernia or abnormality on exam  - did not respond to trial of oxycodone  so discontinued - trial of gabapentin  started; if no benefit, also discontinue   Presumed UTI, POA Follow urine culture; inconclusive - Will plan to complete 3 days of Rocephin  to complete course; completed on 11/20   Parkinson's disease The patient stopped taking his Parkinson's medications - He does not wish to resume at this time; states had no benefit while on treatment; pharmacy states last fill looks to be 2022   Coronary artery disease Peripheral vascular disease Denies any anginal symptoms.   GERD Resume home PPI

## 2024-06-18 DIAGNOSIS — G9341 Metabolic encephalopathy: Secondary | ICD-10-CM | POA: Diagnosis not present

## 2024-06-18 DIAGNOSIS — G8929 Other chronic pain: Secondary | ICD-10-CM

## 2024-06-18 DIAGNOSIS — N3000 Acute cystitis without hematuria: Secondary | ICD-10-CM

## 2024-06-18 DIAGNOSIS — R5381 Other malaise: Secondary | ICD-10-CM

## 2024-06-18 LAB — URINE CULTURE

## 2024-06-18 MED ORDER — OXYCODONE HCL 5 MG PO TABS
5.0000 mg | ORAL_TABLET | Freq: Four times a day (QID) | ORAL | Status: DC | PRN
  Administered 2024-06-18 – 2024-06-19 (×3): 5 mg via ORAL
  Filled 2024-06-18 (×3): qty 1

## 2024-06-18 MED ORDER — ACETAMINOPHEN 500 MG PO TABS
500.0000 mg | ORAL_TABLET | ORAL | Status: DC | PRN
  Administered 2024-06-18: 500 mg via ORAL
  Filled 2024-06-18: qty 1

## 2024-06-18 NOTE — Plan of Care (Signed)

## 2024-06-18 NOTE — NC FL2 (Signed)
 [place]  MEDICAID FL2 LEVEL OF CARE FORM     IDENTIFICATION  Patient Name: Lee [name] Birthdate: 20-Mar-1946 Sex: male Admission Date (Current Location): 06/16/2024  [facility] and [place] Number:  Producer, Television/film/video and Address:  [facility], [address] [place], [place] [address]      Provider Number: [id number]  Attending Physician Name and Address:  Patsy Lenis, MD  Relative Name and Phone Number:  Jimmie Raspberry Sister   [phone]    Current Level of Care: Hospital Recommended Level of Care: Skilled Nursing Facility Prior Approval Number:    Date Approved/Denied:   PASRR Number:    Discharge Plan: SNF    Current Diagnoses: Patient Active Problem List   Diagnosis Date Noted   Acute metabolic encephalopathy 06/16/2024   Leukopenia 02/28/2022   Acquired deformities of toe(s), unspecified, unspecified foot 11/27/2021   Asthma 11/27/2021   Cervicalgia 11/27/2021   Corns and callosities 11/27/2021   Disturbance of skin sensation 11/27/2021   Elevated prostate specific antigen (PSA) 11/27/2021   Excessive attrition of teeth 11/27/2021   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   Family history of cancer 11/27/2021   Family history of malignant neoplasm of prostate 11/27/2021   Hyperlipidemia 11/27/2021   Iron deficiency anemia, unspecified 11/27/2021   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   Myalgia 11/27/2021   Nail dystrophy 11/27/2021   Benign neoplasm of other parts of mouth 11/27/2021   Open angle with borderline findings, high risk, left eye 11/27/2021   Other abnormal glucose 11/27/2021   Other reduced mobility 11/27/2021   Other secondary cataract, bilateral 11/27/2021   Pain in right thigh 11/27/2021   Pain in right knee 11/27/2021   Pain in unspecified shoulder 11/27/2021   Persons encountering health services in other specified circumstances 11/27/2021   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/27/2021   Right lower quadrant pain 11/27/2021   Tinnitus 11/27/2021   Trochanteric bursitis, right hip 11/27/2021   Unilateral primary osteoarthritis, right hip 11/27/2021   Unspecified fall, initial encounter 11/27/2021   Vitamin D  deficiency, unspecified 11/27/2021   Weakness 11/27/2021   Constipation 08/28/2021   Chronic obstructive pulmonary disease (HCC) 05/31/2021   Depression 04/13/2021   Parkinson's disease 02/28/2021   Weakness of both legs 02/01/2021   Dysphagia 02/01/2021   Weakness of right leg 01/06/2021   Lumbar radiculopathy 12/30/2020   Prostate cancer (HCC) 06/09/2020   Well adult exam 06/04/2019   BPH (benign prostatic hyperplasia) 06/04/2019   Bilateral carotid bruits 06/04/2019   Meniere disease, 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   Emphysema of lung (HCC) 06/04/2019   Spondylolisthesis at L4-L5 level 08/27/2017   Spinal stenosis in cervical region 08/27/2017   Tremor 06/02/2015   Cervical spondylosis without myelopathy 06/02/2015   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   Coronary arteriosclerosis 07/30/2009   History of colonoscopy 07/30/2009   Neutropenia 07/30/2008   HIP PAIN 11/27/2007   Essential hypertension 07/25/2007   Atrial fibrillation (HCC) 07/25/2007   GERD 07/25/2007   ERECTILE DYSFUNCTION 07/25/2007   LOW BACK PAIN 07/25/2007    Orientation RESPIRATION BLADDER Height & Weight     Self, Time, Situation, Place  Normal Continent Weight:   Height:  5' 10 (177.8 cm)  BEHAVIORAL SYMPTOMS/MOOD NEUROLOGICAL BOWEL NUTRITION STATUS      Continent Diet (Regular with thin liquids)  AMBULATORY STATUS COMMUNICATION OF NEEDS Skin   Limited Assist Verbally Normal  Personal Care Assistance Level of Assistance  Bathing, Dressing, Feeding Bathing Assistance: Limited assistance Feeding assistance: Independent Dressing Assistance: Limited  assistance     Functional Limitations Info  Sight, Hearing, Speech Sight Info: Impaired (glasses) Hearing Info: Adequate Speech Info: Impaired (dysarthria)    SPECIAL CARE FACTORS FREQUENCY  PT (By licensed PT), OT (By licensed OT), Speech therapy     PT Frequency: 5x week OT Frequency: 5x week     Speech Therapy Frequency: 5x week      Contractures Contractures Info: Not present    Additional Factors Info  Code Status, Allergies Code Status Info: full Allergies Info: acyclovir, enalapril           Current Medications (06/18/2024):  This is the current hospital active medication list Current Facility-Administered Medications  Medication Dose Route Frequency Provider Last Rate Last Admin   acetaminophen  (TYLENOL ) tablet 500 mg  500 mg Oral Q6H PRN Hall, Carole N, DO   500 mg at 06/17/24 0346   cefTRIAXone  (ROCEPHIN ) 1 g in sodium chloride  0.9 % 100 mL IVPB  1 g Intravenous Q24H Singh, Prashant K, MD 200 mL/hr at 06/17/24 1642 1 g at 06/17/24 1642   dicyclomine  (BENTYL ) capsule 10 mg  10 mg Oral TID AC & HS Patsy Lenis, MD   10 mg at 06/18/24 1010   enoxaparin  (LOVENOX ) injection 40 mg  40 mg Subcutaneous Q24H Hall, Carole N, DO   40 mg at 06/18/24 1010   melatonin tablet 5 mg  5 mg Oral QHS PRN Shona Terry SAILOR, DO       pantoprazole  (PROTONIX ) EC tablet 40 mg  40 mg Oral Daily Shona Terry N, DO   40 mg at 06/18/24 1010   polyethylene glycol (MIRALAX  / GLYCOLAX ) packet 17 g  17 g Oral Daily PRN Shona Terry N, DO       prochlorperazine  (COMPAZINE ) injection 5 mg  5 mg Intravenous Q6H PRN Shona Terry SAILOR, DO         Discharge Medications: Please see discharge summary for a list of discharge medications.  Relevant Imaging Results:  Relevant Lab Results:   Additional Information SSN  [id number]  Andrez JULIANNA George, RN

## 2024-06-18 NOTE — TOC Progression Note (Signed)
 Transition of Care [facility]) - Progression Note    Patient Details  Name: Lee [name] MRN: [id number] Date of Birth: 1945/09/01  Transition of [facility]) CM/SW Contact  Landry DELENA Senters, RN Phone Number: 06/18/2024, 2:22 PM  Clinical Narrative:     Patient has chosen [facility] has offered a bed. Information sent to [facility] for insurance authorization.   Expected Discharge Plan: Skilled Nursing Facility Barriers to Discharge: Continued Medical Work up               Expected Discharge Plan and Services       Living arrangements for the past 2 months: Single Family Home                                       Social Drivers of Health (SDOH) Interventions SDOH Screenings   Food Insecurity: No Food Insecurity (06/16/2024)  Housing: Low Risk  (06/16/2024)  Transportation Needs: No Transportation Needs (06/16/2024)  Utilities: Not At Risk (06/16/2024)  Alcohol Screen: Low Risk  (03/26/2022)  Depression (PHQ2-9): High Risk (05/30/2022)  Financial Resource Strain: Low Risk  (03/26/2022)  Physical Activity: Sufficiently Active (03/26/2022)  Social Connections: Moderately Integrated (06/17/2024)  Stress: No Stress Concern Present (03/26/2022)  Tobacco Use: Medium Risk (06/16/2024)    Readmission Risk Interventions     No data to display

## 2024-06-18 NOTE — Progress Notes (Signed)
 Progress Note    Lee [name]   FMW:[id number]  DOB: 1945/10/30  DOA: 06/16/2024     0 PCP: Rik Glinda DASEN, FNP  Initial CC: Slurred speech  Hospital Course: Lee [name] is a 78 y.o. male with medical history significant for Parkinson's disease stopped taking his Parkinson's medication due to no benefit he says, chronic back pain, receives injections to his spine, hypertension, hyperlipidemia, peripheral vascular disease, coronary artery disease, COPD, GERD, who presented to [facility] ED sent from [facility] urgent care to rule out stroke.  The patient endorses worsening slurred speech for the past 2 weeks and the day of admission the slurred speech was much worse to the point where his caretaker could not understand the words he was speaking.  Also endorses right lower extremity weakness and pain from his back all the way down to his right leg.   CT head revealed no acute intracranial abnormality.  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  Moderate bilateral foraminal stenosis at L3-L4 due to disc bulge, intervertebral spurring and facet arthropathy.  Degenerative findings including 9 mm degenerative anterolisthesis at L4-5 and 3 mm degenerative breakthrough listhesis at L1-L2, L2-L3, L3-L4.   UA returned positive for pyuria.  The patient was started on Rocephin  in the ER.  Urine culture is pending.   Due to concern for possible stroke, [facility] was asked to admit.   MRI brain was negative for acute intracranial abnormality.   The patient continues to have limited active range of motion and pain in his right lower extremity, likely from his chronic back pain, L4-L5 foraminal stenosis.  Analgesics, PT OT to evaluate.   Assessment/Plan   Acute metabolic  encephalopathy, resolved Alert and oriented x 3. CT head, MRI brain were nonacute. Continue fall precautions.   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   Continue pain control PT OT evaluations Fall precautions.   Presumed UTI, POA Continue Rocephin  Follow urine culture; inconclusive - Will plan to complete 3 days of Rocephin  to complete course; completed on 11/20   Parkinson's disease The patient stopped taking his Parkinson's medications - He does not wish to resume at this time; states had no benefit while on treatment; pharmacy states last fill looks to be 2022   Coronary artery disease Peripheral vascular disease Denies any anginal symptoms. Continue to monitor on telemetry.   GERD Resume home PPI  Interval History:   Overall improved and mentation and speech back to normal. He is amenable with going to rehab at this time.  Medically stable.   Antimicrobials: Rocephin  06/16/2024 >> 06/18/2024  DVT prophylaxis:  enoxaparin  (LOVENOX ) injection 40 mg Start: 06/16/24 0800   Code Status:   Code Status: Full Code  Mobility Assessment (Last 72 Hours)     Mobility Assessment     Row Name 06/17/24 2005 06/17/24 1117 06/17/24 0929 06/16/24 2046     Does the patient have exclusion criteria? No - Perform mobility assessment -- -- No - Perform mobility assessment    What is the highest level of mobility based on the mobility assessment? Level 4 (Ambulates with assistance) -  Balance while stepping forward/back - Complete Level 4 (Ambulates with assistance) - Balance while stepping forward/back - Complete Level 4 (Ambulates with assistance) - Balance while stepping forward/back - Complete --       Diet: Diet  Orders (From admission, onward)     Start     Ordered   06/16/24 2118  Diet regular Room service appropriate? Yes; Fluid consistency: Thin  Diet effective now       Question Answer Comment  Room service appropriate? Yes   Fluid consistency: Thin      06/16/24 2117            Barriers to discharge: None Disposition Plan: SNF HH orders placed: TBD Status is: Observation  Objective: Blood pressure 110/67, pulse 72, temperature 98.6 F (37 C), temperature source Oral, resp. rate 17, height 5' 10 (1.778 m), SpO2 98%.  Examination:  Physical Exam Constitutional:      General: He is not in acute distress.    Appearance: Normal appearance.  HENT:     Head: Normocephalic and atraumatic.     Mouth/Throat:     Mouth: Mucous membranes are moist.  Eyes:     Extraocular Movements: Extraocular movements intact.  Cardiovascular:     Rate and Rhythm: Normal rate and regular rhythm.  Pulmonary:     Effort: Pulmonary effort is normal. No respiratory distress.     Breath sounds: Normal breath sounds. No wheezing.  Abdominal:     General: Bowel sounds are normal. There is no distension.     Palpations: Abdomen is soft.     Tenderness: There is no abdominal tenderness.  Musculoskeletal:        General: Normal range of motion.     Cervical back: Normal range of motion and neck supple.  Skin:    General: Skin is warm and dry.  Neurological:     General: No focal deficit present.     Mental Status: He is alert.  Psychiatric:        Mood and Affect: Mood normal.        Behavior: Behavior normal.      Consultants:    Procedures:    Data Reviewed: No results found for this or any previous visit (from the past 24 hours).   I have reviewed pertinent nursing notes, vitals, labs, and images as necessary. I have ordered labwork to follow up on as indicated.  I have reviewed the last notes from staff over past 24 hours. I have discussed patient's care plan and test results with  nursing staff, CM/SW, and other staff as appropriate.  Old records reviewed in assessment of this patient  Time spent: Greater than 50% of the 55 minute visit was spent in counseling/coordination of care for the patient as laid out in the A&P.   LOS: 0 days   Alm Apo, MD [facility] 06/18/2024, 2:13 PM

## 2024-06-18 NOTE — Plan of Care (Signed)

## 2024-06-19 DIAGNOSIS — R5381 Other malaise: Secondary | ICD-10-CM | POA: Diagnosis not present

## 2024-06-19 DIAGNOSIS — M549 Dorsalgia, unspecified: Secondary | ICD-10-CM | POA: Diagnosis not present

## 2024-06-19 DIAGNOSIS — G9341 Metabolic encephalopathy: Secondary | ICD-10-CM | POA: Diagnosis not present

## 2024-06-19 DIAGNOSIS — G8929 Other chronic pain: Secondary | ICD-10-CM

## 2024-06-19 DIAGNOSIS — N3000 Acute cystitis without hematuria: Secondary | ICD-10-CM | POA: Diagnosis not present

## 2024-06-19 MED ORDER — GABAPENTIN 300 MG PO CAPS
300.0000 mg | ORAL_CAPSULE | Freq: Three times a day (TID) | ORAL | Status: DC
  Administered 2024-06-19: 300 mg via ORAL
  Filled 2024-06-19: qty 1

## 2024-06-19 MED ORDER — GABAPENTIN 300 MG PO CAPS: 300.0000 mg | ORAL_CAPSULE | Freq: Three times a day (TID) | ORAL | Status: AC

## 2024-06-19 NOTE — Plan of Care (Signed)
   Problem: Education: Goal: Knowledge of General Education information will improve Description: Including pain rating scale, medication(s)/side effects and non-pharmacologic comfort measures Outcome: Progressing   Problem: Clinical Measurements: Goal: Respiratory complications will improve Outcome: Progressing Goal: Cardiovascular complication will be avoided Outcome: Progressing

## 2024-06-19 NOTE — Progress Notes (Signed)
 Physical Therapy Treatment Patient Details Name: Lee [name] MRN: [id number] DOB: 1946/03/04 Today's Date: 06/19/2024   History of Present Illness 79 y.o. male presents to [facility] 06/16/24 with slurred speech and R LE weakness/pain. CT head/MRI negative for acute abnormalities. R LE pain likely related to chronic back pain and L4-L5 foraminal stenosis. Also with + UTI. PMHx: Parkinson's disease stopped taking his Parkinson's medication 2 to 3 months ago, chronic back pain, receives injections to his spine, hypertension, hyperlipidemia, peripheral vascular disease, coronary artery disease, COPD, GERD.    PT Comments  Pt received in supine and agreeable to PT session. Pt continues to have decreased clarity of speech and would stutter when trying to communicate. Pt was distracted during the session by pain in R>L groin. The pain was present at rest and made worse with movement. Despite this, pt was able to stand with MinA and RW with increased effort. Pt was then able to ambulate 52ft with RW and MinA before needing to rest due to pain. Limited ability to complete exercises due to an increase in pain after taking steps. Continue to recommend <3hrs post acute rehab with acute PT to follow.     If plan is discharge home, recommend the following: A lot of help with walking and/or transfers;A lot of help with bathing/dressing/bathroom;Assistance with cooking/housework;Assist for transportation;Help with stairs or ramp for entrance   Can travel by private vehicle     No  Equipment Recommendations  None recommended by PT       Precautions / Restrictions Precautions Precautions: Fall Precaution/Restrictions Comments: hx Parkinson's Restrictions Weight Bearing Restrictions Per Provider Order: No     Mobility  Bed Mobility Overal bed mobility: Needs Assistance Bed Mobility: Supine to Sit, Sit to Supine    Supine to sit: HOB elevated, Used rails, Min assist Sit to supine: Mod assist, HOB  elevated, Used rails   General bed mobility comments: MinA for steadying assist when raising trunk. Able to scoot forwards with CGA for safety. ModA for return to supine for BLE management    Transfers Overall transfer level: Needs assistance Equipment used: Rolling walker (2 wheels) Transfers: Sit to/from Stand Sit to Stand: Min assist  General transfer comment: increased time and effort likely due to pain in R>L groin, MinA for boost-up    Ambulation/Gait Ambulation/Gait assistance: Min assist Gait Distance (Feet): 4 Feet Assistive device: Rolling walker (2 wheels) Gait Pattern/deviations: Step-to pattern, Decreased dorsiflexion - right, Decreased stance time - right Gait velocity: decr     General Gait Details: limited gait distance due to pain in R groin that increased with WB. Minimal foot clearance and hip flexion on R LE     Balance Overall balance assessment: Needs assistance Sitting-balance support: Bilateral upper extremity supported, Feet supported Sitting balance-Leahy Scale: Fair     Standing balance support: Bilateral upper extremity supported, During functional activity, Reliant on assistive device for balance Standing balance-Leahy Scale: Poor Standing balance comment: reliant on RW,       Communication Communication Communication: Impaired Factors Affecting Communication: Reduced clarity of speech (stuttering; very dysarthric - ? baseline)  Cognition Arousal: Alert Behavior During Therapy: WFL for tasks assessed/performed   PT - Cognitive impairments: No family/caregiver present to determine baseline     Following commands: Intact      Cueing Cueing Techniques: Verbal cues, Visual cues  Exercises General Exercises - Lower Extremity Ankle Circles/Pumps: AROM, Right, 10 reps, Seated Long Arc Quad: AROM, Right, 10 reps, Seated    General Comments  General comments (skin integrity, edema, etc.): participatory despite high pain levels       Pertinent Vitals/Pain Pain Assessment Pain Assessment: Faces Faces Pain Scale: Hurts whole lot Pain Location: R>L groin and hip area Pain Descriptors / Indicators: Constant, Discomfort, Grimacing, Guarding, Sore Pain Intervention(s): Limited activity within patient's tolerance, Monitored during session, Repositioned     PT Goals (current goals can now be found in the care plan section) Acute Rehab PT Goals PT Goal Formulation: With patient Time For Goal Achievement: 07/01/24 Potential to Achieve Goals: Good Progress towards PT goals: Progressing toward goals    Frequency    Min 2X/week       AM-PAC PT 6 Clicks Mobility   Outcome Measure  Help needed turning from your back to your side while in a flat bed without using bedrails?: A Lot Help needed moving from lying on your back to sitting on the side of a flat bed without using bedrails?: A Lot Help needed moving to and from a bed to a chair (including a wheelchair)?: A Little Help needed standing up from a chair using your arms (e.g., wheelchair or bedside chair)?: A Little Help needed to walk in hospital room?: Total Help needed climbing 3-5 steps with a railing? : Total 6 Click Score: 12    End of Session   Activity Tolerance: Patient limited by pain Patient left: in bed;with call bell/phone within reach;with chair alarm set Nurse Communication: Mobility status PT Visit Diagnosis: Unsteadiness on feet (R26.81);Other abnormalities of gait and mobility (R26.89);Muscle weakness (generalized) (M62.81);History of falling (Z91.81)     Time: 8950-8895 PT Time Calculation (min) (ACUTE ONLY): 15 min  Charges:    $Therapeutic Exercise: 8-22 mins PT General Charges $$ ACUTE PT VISIT: 1 Visit                    Kate ORN, PT, DPT Secure Chat Preferred  Rehab Office [phone]   Kate BRAVO [name] 06/19/2024, 11:10 AM

## 2024-06-19 NOTE — Progress Notes (Signed)
 Occupational Therapy Treatment Patient Details Name: Lee [name] MRN: [id number] DOB: 1946/05/22 Today's Date: 06/19/2024   History of present illness 78 y.o. male presents to [facility] 06/16/24 with slurred speech and R LE weakness/pain. CT head/MRI negative for acute abnormalities. R LE pain likely related to chronic back pain and L4-L5 foraminal stenosis. Also with + UTI. PMHx: Parkinson's disease stopped taking his Parkinson's medication 2 to 3 months ago, chronic back pain, receives injections to his spine, hypertension, hyperlipidemia, peripheral vascular disease, coronary artery disease, COPD, GERD.   OT comments  Pt greeted in bed, agreeable for OT session despite high pain levels. RN aware of pt's pain in R>L groin area, reports she notified MD as well. Pt requiring more assist today likely 2/2 pain. Mod A for bed mobility and min A to stand with incr effort/time. Initially retropulsive, but corrects with external assist. He needed max A for LB ADLs and min A for stability while urinating in stance. Setup for bed level grooming. Pt requesting to return to bed 2/2 pain in groin (pt with difficulty elaborating on pain descriptors/onset). OT to continue to follow and progress as able.      If plan is discharge home, recommend the following:  A little help with walking and/or transfers;A lot of help with bathing/dressing/bathroom;Assistance with cooking/housework;Direct supervision/assist for medications management;Direct supervision/assist for financial management;Assist for transportation;Supervision due to cognitive status   Equipment Recommendations  Other (comment) (defer)    Recommendations for Other Services      Precautions / Restrictions Precautions Precautions: Fall Precaution/Restrictions Comments: hx Parkinson's Restrictions Weight Bearing Restrictions Per Provider Order: No       Mobility Bed Mobility Overal bed mobility: Needs Assistance Bed Mobility: Supine to Sit,  Sit to Supine     Supine to sit: Mod assist, HOB elevated, Used rails Sit to supine: Mod assist, HOB elevated, Used rails   General bed mobility comments: assist for BLE mgmt & trunk elevation, limited by pain; unable to utilize ankle hook method in attempts to manage RLE himself    Transfers Overall transfer level: Needs assistance Equipment used: Rolling walker (2 wheels) Transfers: Sit to/from Stand Sit to Stand: Min assist           General transfer comment: Stood from bed with incr effort to come to stance. Initially retropulsive, corrects with external assist.     Balance Overall balance assessment: Needs assistance Sitting-balance support: Bilateral upper extremity supported, Feet supported Sitting balance-Leahy Scale: Fair Sitting balance - Comments: seated EOB, no overt LOB; heavily reliant on BUE for support 2/2 groin pain Postural control: Posterior lean Standing balance support: Bilateral upper extremity supported, During functional activity, Reliant on assistive device for balance Standing balance-Leahy Scale: Poor Standing balance comment: reliant on RW, needs min A for initial retropulsion upon standing.                           ADL either performed or assessed with clinical judgement   ADL Overall ADL's : Needs assistance/impaired     Grooming: Set up;Wash/dry hands;Bed level               Lower Body Dressing: Maximal assistance;Sitting/lateral leans Lower Body Dressing Details (indicate cue type and reason): adjusting B socks, limited heavily by groin pain     Toileting- Clothing Manipulation and Hygiene: Minimal assistance;Sit to/from stand;Cueing for safety Toileting - Clothing Manipulation Details (indicate cue type and reason): using urinal, assist for stability in stance  during clothing mgmt     Functional mobility during ADLs: Minimal assistance;Rolling walker (2 wheels)      Extremity/Trunk Assessment Upper Extremity  Assessment Upper Extremity Assessment: Generalized weakness            Vision       Perception     Praxis     Communication Communication Communication: Impaired Factors Affecting Communication: Reduced clarity of speech (stuttering; very dysarthric - ? baseline)   Cognition Arousal: Alert Behavior During Therapy: WFL for tasks assessed/performed Cognition: No apparent impairments             OT - Cognition Comments: decr insight/comprehension                 Following commands: Intact        Cueing   Cueing Techniques: Verbal cues, Visual cues  Exercises      Shoulder Instructions       General Comments participatory despite high pain levels    Pertinent Vitals/ Pain       Pain Assessment Pain Assessment: Faces Faces Pain Scale: Hurts whole lot Pain Location: R>L groin and hip area Pain Descriptors / Indicators: Constant, Discomfort, Grimacing, Guarding, Sore Pain Intervention(s): Limited activity within patient's tolerance, Monitored during session, Repositioned, Patient requesting pain meds-RN notified  Home Living                                          Prior Functioning/Environment              Frequency  Min 2X/week        Progress Toward Goals  OT Goals(current goals can now be found in the care plan section)  Progress towards OT goals: Progressing toward goals (slowly)  Acute Rehab OT Goals Patient Stated Goal: have pain better managed  Plan      Co-evaluation                 AM-PAC OT 6 Clicks Daily Activity     Outcome Measure   Help from another person eating meals?: None Help from another person taking care of personal grooming?: A Little Help from another person toileting, which includes using toliet, bedpan, or urinal?: A Little Help from another person bathing (including washing, rinsing, drying)?: A Lot Help from another person to put on and taking off regular upper body  clothing?: A Little Help from another person to put on and taking off regular lower body clothing?: A Lot 6 Click Score: 17    End of Session Equipment Utilized During Treatment: Gait belt;Rolling walker (2 wheels)  OT Visit Diagnosis: Unsteadiness on feet (R26.81);History of falling (Z91.81);Other abnormalities of gait and mobility (R26.89)   Activity Tolerance Patient limited by pain (RN aware)   Patient Left in bed;with call bell/phone within reach;with bed alarm set   Nurse Communication Mobility status;Patient requests pain meds        Time: 9141-9088 OT Time Calculation (min): 13 min  Charges: OT General Charges $OT Visit: 1 Visit OT Treatments $Self Care/Home Management : 8-22 mins  Jeffery Gammell M. [place], OTR/L [facility] Acute Rehabilitation Services [phone] Secure Chat Preferred  Lee [name] 06/19/2024, 9:54 AM

## 2024-06-19 NOTE — Progress Notes (Signed)
 Pt has been approved for SNF at [facility] care 06/18/2024-06/22/2024 [id number]

## 2024-06-19 NOTE — TOC Transition Note (Signed)
 Transition of Care [facility]) - Discharge Note   Patient Details  Name: Lee [name] MRN: [id number] Date of Birth: [birth year]  Transition of Care [facility]) CM/SW Contact:  Landry DELENA Senters, RN Phone Number: 06/19/2024, 11:59 AM   Clinical Narrative:     Patient will discharge to SNF-[facility] today for rehab. Patient will be transported by [facility]. Patient reports he wishes to notify family himself of d/c.   Room# 122a  Report called to [phone]  No further needs identified by CM.    Final next level of care: Skilled Nursing Facility Barriers to Discharge: No Barriers Identified   Patient Goals and CMS Choice   CMS Medicare.gov Compare Post Acute Care list provided to:: Patient Choice offered to / list presented to : Patient      Discharge Placement PASRR number recieved: 06/18/24 Existing PASRR number confirmed : 06/18/24          Patient chooses bed at: [facility] Patient to be transferred to facility by: [facility] Name of family member notified: patient reports he wants to inform family Patient and family notified of of transfer: 06/19/24  Discharge Plan and Services Additional resources added to the After Visit Summary for                                       Social Drivers of Health (SDOH) Interventions SDOH Screenings   Food Insecurity: No Food Insecurity (06/16/2024)  Housing: Low Risk  (06/16/2024)  Transportation Needs: No Transportation Needs (06/16/2024)  Utilities: Not At Risk (06/16/2024)  Alcohol Screen: Low Risk  (03/26/2022)  Depression (PHQ2-9): High Risk (05/30/2022)  Financial Resource Strain: Low Risk  (03/26/2022)  Physical Activity: Sufficiently Active (03/26/2022)  Social Connections: Moderately Integrated (06/17/2024)  Stress: No Stress Concern Present (03/26/2022)  Tobacco Use: Medium Risk (06/16/2024)     Readmission Risk Interventions     No data to display

## 2024-06-19 NOTE — Discharge Summary (Signed)
 Physician Discharge Summary   Lee [name] FMW:[id number] DOB: 02-Mar-1946 DOA: 06/16/2024  PCP: Rik Glinda DASEN, FNP  Admit date: 06/16/2024 Discharge date: 06/19/2024  Admitted From: Home Disposition:  SNF Discharging physician: Alm Apo, MD Barriers to discharge: none  Recommendations at discharge: If no improvement with gabapentin  trial for groin pain, then discontinue  Follow up with upcoming neurology appointment as scheduled   Discharge Condition: stable CODE STATUS: FULL  Diet recommendation:  Diet Orders (From admission, onward)     Start     Ordered   06/19/24 0000  Diet general        06/19/24 1021   06/16/24 2118  Diet regular Room service appropriate? Yes; Fluid consistency: Thin  Diet effective now       Question Answer Comment  Room service appropriate? Yes   Fluid consistency: Thin      06/16/24 2117            Hospital Course: Lee [name] is a 78 y.o. male with medical history significant for Parkinson's disease stopped taking his Parkinson's medication due to no benefit he says, chronic back pain, receives injections to his spine, hypertension, hyperlipidemia, peripheral vascular disease, coronary artery disease, COPD, GERD, who presented to [facility] ED sent from [facility] urgent care to rule out stroke.  The patient endorses worsening slurred speech for the past 2 weeks and the day of admission the slurred speech was much worse to the point where his caretaker could not understand the words he was speaking.  Also endorses right lower extremity weakness and pain from his back all the way down to his right leg.   CT head revealed no acute intracranial abnormality.  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  Moderate  bilateral foraminal stenosis at L3-L4 due to disc bulge, intervertebral spurring and facet arthropathy.  Degenerative findings including 9 mm degenerative anterolisthesis at L4-5 and 3 mm degenerative breakthrough listhesis at L1-L2, L2-L3, L3-L4.   UA returned positive for pyuria.  The patient was started on Rocephin  in the ER.  Urine culture is pending.   Due to concern for possible stroke, [facility] was asked to admit.   MRI brain was negative for acute intracranial abnormality.   The patient continues to have limited active range of motion and pain in his right lower extremity, likely from his chronic back pain, L4-L5 foraminal stenosis.  Analgesics, PT OT to evaluate.   Assessment/Plan   Acute metabolic encephalopathy, resolved Alert and oriented x 3. CT head, MRI brain were nonacute. Continue fall precautions.   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   PT OT evaluations, SNF recommended  Groin pain - odd description and present chronically. No palpable hernia or abnormality on exam  - did not respond to trial of oxycodone  so discontinued - trial of gabapentin  started; if no benefit, also discontinue   Presumed UTI, POA Follow urine culture; inconclusive - Will plan to complete 3 days of Rocephin  to complete course; completed on 11/20   Parkinson's disease The patient stopped taking his Parkinson's medications - He does not wish to resume at this  time; states had no benefit while on treatment; pharmacy states last fill looks to be 2022   Coronary artery disease Peripheral vascular disease Denies any anginal symptoms.   GERD Resume home PPI   Principal Diagnosis: Acute metabolic encephalopathy  Discharge  Diagnoses: Active Hospital Problems   Diagnosis Date Noted   Physical deconditioning 06/18/2024    Priority: 1.   Chronic back pain 06/18/2024    Priority: 2.   Parkinson's disease 02/28/2021    Priority: 2.   Essential hypertension 07/25/2007   GERD 07/25/2007    Resolved Hospital Problems   Diagnosis Date Noted Date Resolved   Acute metabolic encephalopathy 06/16/2024 06/18/2024    Priority: 1.   Acute cystitis 06/18/2024 06/18/2024    Priority: 1.     Discharge Instructions     Diet general   Complete by: As directed    Increase activity slowly   Complete by: As directed       Allergies as of 06/19/2024       Reactions   Acyclovir Swelling   Vasotec [enalapril] Other (See Comments)   Unknown reaction        Medication List     STOP taking these medications    carbidopa -levodopa  25-100 MG tablet Commonly known as: SINEMET  IR       TAKE these medications    albuterol  108 (90 Base) MCG/ACT inhaler Commonly known as: VENTOLIN  HFA Inhale 2 puffs into the lungs every 4 (four) hours as needed for wheezing or shortness of breath.   Alum Hydroxide-Mag Carbonate 160-105 MG Chew Chew 1 tablet by mouth 3 (three) times daily as needed (sour stomach).   ascorbic acid 500 MG tablet Commonly known as: VITAMIN C TAKE ONE TABLET BY MOUTH DAILY TO HELP WITH IRON STORES AND IMMUNE SYSTEM. PLEASE TAKE EVERY DAY AND ESPECIALLY WHEN YOU TAKE FERROUS SULFATE.   Capsaicin 0.1 % Crea Apply 1 application  topically 2 (two) times daily as needed (pain).   Cholecalciferol 50 MCG (2000 UT) Tabs Take 2,000 Units by mouth daily.   diclofenac Sodium 1 % Gel Commonly known as: VOLTAREN Apply 1 Application topically 2 (two) times daily as needed (pain).   Ensure Plus Liqd Take 237 mLs by mouth 3 (three) times daily.   fluticasone 50 MCG/ACT nasal spray Commonly known as: FLONASE Place 2 sprays into both nostrils daily.   folic acid 1 MG tablet Commonly known as:  FOLVITE Take 1 mg by mouth daily.   gabapentin  300 MG capsule Commonly known as: NEURONTIN  Take 1 capsule (300 mg total) by mouth 3 (three) times daily.   mometasone -formoterol  200-5 MCG/ACT Aero Commonly known as: DULERA Inhale 2 puffs into the lungs in the morning and at bedtime. What changed:  when to take this reasons to take this   multivitamin with minerals tablet Take 1 tablet by mouth daily.   oxybutynin 10 MG 24 hr tablet Commonly known as: DITROPAN-XL Take 10 mg by mouth at bedtime.   pantoprazole  40 MG tablet Commonly known as: PROTONIX  Take 40 mg by mouth daily.   Stiolto Respimat 2.5-2.5 MCG/ACT Aers Generic drug: Tiotropium Bromide-Olodaterol Inhale 2 puffs into the lungs daily.   tamsulosin 0.4 MG Caps capsule Commonly known as: FLOMAX Take 0.4 mg by mouth at bedtime.        Contact information for after-discharge care     Destination     [facility] .   Service: Skilled Nursing Contact information: [address] [place]  [phone]  Allergies  Allergen Reactions   Acyclovir Swelling   Vasotec [Enalapril] Other (See Comments)    Unknown reaction    Consultations:   Procedures:   Discharge Exam: BP 109/66 (BP Location: Left Arm)   Pulse 66   Temp 97.8 F (36.6 C) (Oral)   Resp 17   Ht 5' 10 (1.778 m)   SpO2 100%   BMI 21.52 kg/m  Physical Exam Constitutional:      General: He is not in acute distress.    Appearance: Normal appearance.  HENT:     Head: Normocephalic and atraumatic.     Mouth/Throat:     Mouth: Mucous membranes are moist.  Eyes:     Extraocular Movements: Extraocular movements intact.  Cardiovascular:     Rate and Rhythm: Normal rate and regular rhythm.  Pulmonary:     Effort: Pulmonary effort is normal. No respiratory distress.     Breath sounds: Normal breath sounds. No wheezing.  Abdominal:     General: Bowel sounds are normal. There  is no distension.     Palpations: Abdomen is soft.     Tenderness: There is no abdominal tenderness.  Musculoskeletal:        General: Normal range of motion.     Cervical back: Normal range of motion and neck supple.  Skin:    General: Skin is warm and dry.  Neurological:     General: No focal deficit present.     Mental Status: He is alert.  Psychiatric:        Mood and Affect: Mood normal.        Behavior: Behavior normal.      The results of significant diagnostics from this hospitalization (including imaging, microbiology, ancillary and laboratory) are listed below for reference.   Microbiology: Recent Results (from the past 240 hours)  Urine Culture     Status: Abnormal   Collection Time: 06/16/24  1:18 PM   Specimen: Urine, Random  Result Value Ref Range Status   Specimen Description   Final    URINE, RANDOM Performed at [facility], [address] [place], [place] [address]    Special Requests   Final    NONE Reflexed from [id number] Performed at [facility], [address], [place], [place] [address]    Culture MULTIPLE SPECIES PRESENT, SUGGEST RECOLLECTION (A)  Final   Report Status 06/18/2024 FINAL  Final     Labs: BNP (last 3 results) No results for input(s): BNP in the last 8760 hours. Basic Metabolic Panel: Recent Labs  Lab 06/16/24 1320 06/17/24 0409  NA 139 138  K 3.6 3.8  CL 103 105  CO2 26 22  GLUCOSE 90 92  BUN 14 11  CREATININE 0.92 0.89  CALCIUM 9.8 8.5*  MG  --  1.9  PHOS  --  3.1   Liver Function Tests: Recent Labs  Lab 06/16/24 1320  AST 19  ALT 7  ALKPHOS 74  BILITOT 0.4  PROT 7.1  ALBUMIN 4.0   No results for input(s): LIPASE, AMYLASE in the last 168 hours. No results for input(s): AMMONIA in the last 168 hours. CBC: Recent Labs  Lab 06/16/24 1320 06/16/24 1446 06/17/24 0409  WBC 3.3* 3.2* 3.1*  NEUTROABS  --  1.8  --   HGB 13.8 13.7 12.2*  HCT 40.7 40.6 36.1*  MCV  99.0 99.0 98.9  PLT 195 200 189   Cardiac Enzymes: No results for input(s): CKTOTAL, CKMB, CKMBINDEX, TROPONINI in the  last 168 hours. BNP: Invalid input(s): POCBNP CBG: No results for input(s): GLUCAP in the last 168 hours. D-Dimer No results for input(s): DDIMER in the last 72 hours. Hgb A1c No results for input(s): HGBA1C in the last 72 hours. Lipid Profile No results for input(s): CHOL, HDL, LDLCALC, TRIG, CHOLHDL, LDLDIRECT in the last 72 hours. Thyroid function studies No results for input(s): TSH, T4TOTAL, T3FREE, THYROIDAB in the last 72 hours.  Invalid input(s): FREET3 Anemia work up No results for input(s): VITAMINB12, FOLATE, FERRITIN, TIBC, IRON, RETICCTPCT in the last 72 hours. Urinalysis    Component Value Date/Time   COLORURINE YELLOW 06/16/2024 1318   COLORURINE YELLOW 06/16/2024 1318   APPEARANCEUR HAZY (A) 06/16/2024 1318   APPEARANCEUR HAZY (A) 06/16/2024 1318   LABSPEC 1.030 06/16/2024 1318   LABSPEC 1.030 06/16/2024 1318   PHURINE 6.5 06/16/2024 1318   PHURINE 6.5 06/16/2024 1318   GLUCOSEU NEGATIVE 06/16/2024 1318   GLUCOSEU NEGATIVE 06/16/2024 1318   GLUCOSEU NEGATIVE 02/01/2021 1530   HGBUR NEGATIVE 06/16/2024 1318   HGBUR NEGATIVE 06/16/2024 1318   BILIRUBINUR NEGATIVE 06/16/2024 1318   BILIRUBINUR NEGATIVE 06/16/2024 1318   KETONESUR NEGATIVE 06/16/2024 1318   KETONESUR NEGATIVE 06/16/2024 1318   PROTEINUR TRACE (A) 06/16/2024 1318   PROTEINUR TRACE (A) 06/16/2024 1318   UROBILINOGEN 0.2 02/01/2021 1530   NITRITE POSITIVE (A) 06/16/2024 1318   NITRITE POSITIVE (A) 06/16/2024 1318   LEUKOCYTESUR MODERATE (A) 06/16/2024 1318   LEUKOCYTESUR MODERATE (A) 06/16/2024 1318   Sepsis Labs Recent Labs  Lab 06/16/24 1320 06/16/24 1446 06/17/24 0409  WBC 3.3* 3.2* 3.1*   Microbiology Recent Results (from the past 240 hours)  Urine Culture     Status: Abnormal   Collection Time: 06/16/24  1:18  PM   Specimen: Urine, Random  Result Value Ref Range Status   Specimen Description   Final    URINE, RANDOM Performed at [facility], [address] [place], [place] [address]    Special Requests   Final    NONE Reflexed from [id number] Performed at [facility], [address], [place], [place] [address]    Culture MULTIPLE SPECIES PRESENT, SUGGEST RECOLLECTION (A)  Final   Report Status 06/18/2024 FINAL  Final    Procedures/Studies: ARCOLA Swallowing Func-Speech Pathology Result Date: 06/17/2024 Table formatting from the original result was not included. Modified Barium Swallow Study Patient Details Name: Lee [name] MRN: [id number] Date of Birth: August 08, 1945 Today's Date: 06/17/2024 HPI/PMH: HPI: Tullio Chausse is a 78 y.o. male with worsening slurred speech, LE weakness. Found to have pyuria, acute metabolic encephalopathy resolved, RLE weakness suspect from back pain per MD. MRI no acute intracranial abnormality. In ED pt reported difficulty swallowing saliva x 3 weeks. MBS 02/2022 functional oropharyngeal swallow with question of esophageal symptoms. GI recommended barium esophagram 04/2023 mild stricture above GE junction, pill entered stomach without difficulty. EGD several years ago with dilation with minimal improvement per pt. GI offered EGD however no documentation of completion. CT cervical spine straightening of the cervical spine with multilevel degenerative changes. PMH: Parkinson's disease (per chart stopped taking his Parkinson's medication 2 to 3 months ago), GERD, COPD chronic back pain Clinical Impression: Clinical Impression: Pt demonstrated a mild pharyngeal dysphagia with adequate mobility of larynx to elevate and establish effective closure with one instance of decreased timing resulting in late elevation with  penetration during sequential straw sips thin. This was spontaneously ejected during 2nd swallow in sequence. During pill and  thin he had trace  high penetration that cleared with cue to cough. Tongue base retraction  was consistently reduced leading to frequent however mild residue in valleculae and once in pyriform sinuses decreased volume with heavier viscosities. Cues to produce a second effortful swallow intermittently decreased residue. Esophageal scan revealed timely transit of pill into GE junction. Recommend continue regular texture, thin, small sips, multiple swallows, intermittent throat clears, single pills with thin and remain upright after meals due to history of GERD and esophageal stricture. ST will follow once to reiterate strategies. Factors that may increase risk of adverse event in presence of aspiration Noe & Lianne 2021): Factors that may increase risk of adverse event in presence of aspiration Noe & Lianne 2021): -- (Parkinsons) Recommendations/Plan: Swallowing Evaluation Recommendations Swallowing Evaluation Recommendations Recommendations: PO diet PO Diet Recommendation: Regular; Thin liquids (Level 0) Liquid Administration via: Cup; Straw Medication Administration: Whole meds with liquid Treatment Plan Treatment Plan Treatment recommendations: Therapy as outlined in treatment plan below Follow-up recommendations: No SLP follow up Functional status assessment: Patient has had a recent decline in their functional status and demonstrates the ability to make significant improvements in function in a reasonable and predictable amount of time. Treatment frequency: Min 1x/week Treatment duration: 1 week Interventions: Compensatory techniques Recommendations Recommendations for follow up therapy are one component of a multi-disciplinary discharge planning process, led by the attending physician.  Recommendations may be updated based on patient status, additional functional criteria and insurance authorization. Assessment: Orofacial Exam: Orofacial Exam Oral Cavity: Oral Hygiene: Lingual coating Oral Cavity -  Dentition: Missing dentition (missing bil posterior dentition) Orofacial Anatomy: WFL Anatomy: Anatomy: Other (Comment) (diagnosed with decreased cervical lordosis) Boluses Administered: Boluses Administered Boluses Administered: Thin liquids (Level 0); Mildly thick liquids (Level 2, nectar thick); Moderately thick liquids (Level 3, honey thick); Puree; Solid  Oral Impairment Domain: Oral Impairment Domain Lip Closure: No labial escape Tongue control during bolus hold: Cohesive bolus between tongue to palatal seal Bolus preparation/mastication: Timely and efficient chewing and mashing Bolus transport/lingual motion: Brisk tongue motion Oral residue: Trace residue lining oral structures Location of oral residue : Tongue Initiation of pharyngeal swallow : Valleculae  Pharyngeal Impairment Domain: Pharyngeal Impairment Domain Soft palate elevation: No bolus between soft palate (SP)/pharyngeal wall (PW) Laryngeal elevation: Complete superior movement of thyroid cartilage with complete approximation of arytenoids to epiglottic petiole Anterior hyoid excursion: Complete anterior movement Epiglottic movement: Complete inversion Laryngeal vestibule closure: Complete, no air/contrast in laryngeal vestibule Pharyngeal stripping wave : Present - complete Pharyngeal contraction (A/P view only): N/A Pharyngoesophageal segment opening: Complete distension and complete duration, no obstruction of flow Tongue base retraction: Narrow column of contrast or air between tongue base and PPW Pharyngeal residue: Collection of residue within or on pharyngeal structures Location of pharyngeal residue: Valleculae; Pyriform sinuses (mostly in valleculae)  Esophageal Impairment Domain: Esophageal Impairment Domain Esophageal clearance upright position: Complete clearance, esophageal coating Pill: Pill Consistency administered: Thin liquids (Level 0) Thin liquids (Level 0): Impaired (see clinical impressions) (penetration after from residue)  Penetration/Aspiration Scale Score: Penetration/Aspiration Scale Score 1.  Material does not enter airway: Mildly thick liquids (Level 2, nectar thick); Moderately thick liquids (Level 3, honey thick); Puree; Solid; Pill 3.  Material enters airway, remains ABOVE vocal cords and not ejected out: Thin liquids (Level 0) Compensatory Strategies: Compensatory Strategies Compensatory strategies: Yes Effortful swallow: Effective Multiple swallows: Effective Effective Multiple Swallows: Mildly thick liquid (Level 2, nectar thick); Thin liquid (Level 0) Other(comment): Effective (throat clear)   General Information: Caregiver present: No  Diet  Prior to this Study: Regular; Thin liquids (Level 0)   Temperature : Normal   Respiratory Status: WFL   Supplemental O2: None (Room air)   History of Recent Intubation: No  Behavior/Cognition: Alert; Cooperative; Pleasant mood Self-Feeding Abilities: Able to self-feed Baseline vocal quality/speech: Hypophonia/low volume Volitional Cough: Able to elicit Volitional Swallow: Able to elicit Exam Limitations: No limitations Goal Planning: Prognosis for improved oropharyngeal function: Good No data recorded No data recorded No data recorded Consulted and agree with results and recommendations: Patient; Physician; Nurse Pain: Pain Assessment Pain Assessment: No/denies pain End of Session: Start Time:SLP Start Time (ACUTE ONLY): 1214 Stop Time: SLP Stop Time (ACUTE ONLY): 1227 Time Calculation:SLP Time Calculation (min) (ACUTE ONLY): 13 min Charges: SLP Evaluations $ SLP Speech Visit: 1 Visit SLP Evaluations $BSS Swallow: 1 Procedure $MBS Swallow: 1 Procedure SLP visit diagnosis: SLP Visit Diagnosis: Dysphagia, pharyngeal phase (R13.13) Past Medical History: Past Medical History: Diagnosis Date  Acid reflux   Alcohol abuse   quit 10 y ago  Arthritis   BPH (benign prostatic hyperplasia)   CAD (coronary artery disease)   Cervical spondylosis without myelopathy 06/02/2015  COPD (chronic  obstructive pulmonary disease) (HCC)   Emphysema lung (HCC)   GERD (gastroesophageal reflux disease)   High cholesterol   Hypertension   Parkinson's disease (HCC)   PVD (peripheral vascular disease)   Tremor 06/02/2015 Past Surgical History: Past Surgical History: Procedure Laterality Date  TONSILLECTOMY    VASECTOMY   Dustin Olam Bull 06/17/2024, 2:22 PM  MR BRAIN WO CONTRAST Result Date: 06/17/2024 EXAM: MRI Brain Without Contrast 06/17/2024 03:19:16 AM TECHNIQUE: Multiplanar multisequence MRI of the head/brain was performed without the administration of intravenous contrast. COMPARISON: CT Head Jun 16, 2024.  MRI Head November 14, 2023 CLINICAL HISTORY: Neuro deficit, acute, stroke suspected FINDINGS: BRAIN AND VENTRICLES: No acute infarct. No intracranial hemorrhage. No mass. No midline shift. No hydrocephalus. Normal flow voids. ORBITS: No acute abnormality. SINUSES AND MASTOIDS: No acute abnormality. BONES AND SOFT TISSUES: Normal marrow signal. IMPRESSION: 1. No acute intracranial abnormality. Electronically signed by: Gilmore Molt MD 06/17/2024 03:23 AM EST RP Workstation: [id number]   CT HEAD WO CONTRAST Result Date: 06/16/2024 EXAM: CT HEAD WITHOUT CONTRAST 06/16/2024 03:51:29 PM TECHNIQUE: CT of the head was performed without the administration of intravenous contrast. Automated exposure control, iterative reconstruction, and/or weight based adjustment of the mA/kV was utilized to reduce the radiation dose to as low as reasonably achievable. COMPARISON: Head CT 11/21/2023 and MRI 11/14/2023. CLINICAL HISTORY: Neuro deficit, acute, stroke suspected. FINDINGS: BRAIN AND VENTRICLES: There is no evidence of an acute infarct, intracranial hemorrhage, mass, midline shift, hydrocephalus, or extra-axial fluid collection. There is mild cerebral atrophy. Calcified atherosclerosis at the skull base. ORBITS: Bilateral cataract extraction. SINUSES: No acute abnormality. SOFT TISSUES AND SKULL: No acute  soft tissue abnormality. No skull fracture. IMPRESSION: 1. No acute intracranial abnormality. Electronically signed by: Dasie Hamburg MD 06/16/2024 04:11 PM EST RP Workstation: [id number]   CT Lumbar Spine Wo Contrast Result Date: 06/16/2024 EXAM: CT OF THE LUMBAR SPINE WITHOUT CONTRAST 06/16/2024 03:51:29 PM TECHNIQUE: CT of the lumbar spine was performed without the administration of intravenous contrast. Multiplanar reformatted images are provided for review. Automated exposure control, iterative reconstruction, and/or weight based adjustment of the mA/kV was utilized to reduce the radiation dose to as low as reasonably achievable. COMPARISON: Lumbar MRI 11/14/2023. CLINICAL HISTORY: Low back pain with right leg weakness over the last 3 weeks. FINDINGS: BONES AND ALIGNMENT: Normal vertebral body heights.  No acute fracture or suspicious bone lesion. Normal alignment. 3 mm degenerative retrolisthesis at L1-L2, L2-L3, and L3-L4. 9 mm degenerative anterolisthesis at L4-L5. Schmorl nodes along the endplates at L1-L2, L2-L3, and L3-L4. Anterior Schmorl node along the T12 inferior endplate. Old deformity of the left L3 transverse process from healed fracture. DEGENERATIVE FINDINGS: Loss of disc height at L1-L2 and L4-L5. T12-L1: Disc bulge noted. No impingement. L1-L2: Disc bulge noted. No impingement. L2-L3: Disc bulge noted. No impingement. L3-L4: Moderate bilateral foraminal stenosis. T2 disc bulge, intervertebral spurring, and facet arthropathy. L4-L5: Prominent central stenosis and moderate bilateral foraminal stenosis due to disc uncovering, left foraminal disc protrusion, and facet arthropathy. Moderate bilateral subarticular lateral recess stenosis. L5-S1: Disc bulge noted. No impingement. SOFT TISSUES: No acute abnormality. Dependent subsegmental atelectasis or scarring in the left lower lobe. Systemic atherosclerosis is present, including the aorta and iliac arteries. Stable hypodense lesion in the dome of  the right hepatic lobe, no change from 10/26/2022, likely cyst or similar benign lesion. No further imaging workup of this lesion is indicated. IMPRESSION: 1. 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 2. Moderate bilateral foraminal stenosis at L3-L4 due to disc bulge, intervertebral spurring, and facet arthropathy. 3. Degenerative findings including 9 mm degenerative anterolisthesis at L4-L5 and 3 mm degenerative retrolisthesis at L1-L2, L2-L3, and L3-L4. Electronically signed by: Ryan Salvage MD 06/16/2024 04:00 PM EST RP Workstation: [id number]     Time coordinating discharge: Over 30 minutes    Alm Apo, MD  [facility] 06/19/2024, 10:23 AM

## 2024-06-19 NOTE — Plan of Care (Signed)
  Problem: Education: Goal: Knowledge of General Education information will improve Description: Including pain rating scale, medication(s)/side effects and non-pharmacologic comfort measures 06/19/2024 1237 by Berkeley Verdie BRAVO, RN Outcome: Adequate for Discharge 06/19/2024 0851 by Berkeley Verdie BRAVO, RN Outcome: Progressing   Problem: Health Behavior/Discharge Planning: Goal: Ability to manage health-related needs will improve Outcome: Adequate for Discharge   Problem: Clinical Measurements: Goal: Ability to maintain clinical measurements within normal limits will improve Outcome: Adequate for Discharge Goal: Will remain free from infection Outcome: Adequate for Discharge Goal: Diagnostic test results will improve Outcome: Adequate for Discharge Goal: Respiratory complications will improve 06/19/2024 1237 by Berkeley Verdie BRAVO, RN Outcome: Adequate for Discharge 06/19/2024 0851 by Berkeley Verdie BRAVO, RN Outcome: Progressing Goal: Cardiovascular complication will be avoided 06/19/2024 1237 by Berkeley Verdie BRAVO, RN Outcome: Adequate for Discharge 06/19/2024 0851 by Berkeley Verdie BRAVO, RN Outcome: Progressing   Problem: Activity: Goal: Risk for activity intolerance will decrease Outcome: Adequate for Discharge   Problem: Nutrition: Goal: Adequate nutrition will be maintained Outcome: Adequate for Discharge   Problem: Coping: Goal: Level of anxiety will decrease Outcome: Adequate for Discharge   Problem: Elimination: Goal: Will not experience complications related to bowel motility Outcome: Adequate for Discharge Goal: Will not experience complications related to urinary retention Outcome: Adequate for Discharge   Problem: Pain Managment: Goal: General experience of comfort will improve and/or be controlled Outcome: Adequate for Discharge   Problem: Safety: Goal: Ability to remain free from injury will improve Outcome: Adequate for Discharge   Problem: Skin Integrity: Goal:  Risk for impaired skin integrity will decrease Outcome: Adequate for Discharge

## 2024-06-19 NOTE — Progress Notes (Signed)
 Discharge report called to [facility].  Report given to receiving nurse Tanganika, LPN.  AVS placed in chart by CM.
